# Patient Record
Sex: Female | Born: 1955
Health system: Southern US, Community
[De-identification: ages and names within clinical notes are randomized; demographics above are authoritative.]

## PROBLEM LIST (undated history)

## (undated) DIAGNOSIS — R600 Localized edema: Secondary | ICD-10-CM

## (undated) DIAGNOSIS — M1990 Unspecified osteoarthritis, unspecified site: Secondary | ICD-10-CM

## (undated) DIAGNOSIS — Z8709 Personal history of other diseases of the respiratory system: Secondary | ICD-10-CM

## (undated) DIAGNOSIS — Z8719 Personal history of other diseases of the digestive system: Secondary | ICD-10-CM

## (undated) DIAGNOSIS — K219 Gastro-esophageal reflux disease without esophagitis: Secondary | ICD-10-CM

## (undated) DIAGNOSIS — R519 Headache, unspecified: Secondary | ICD-10-CM

## (undated) DIAGNOSIS — H409 Unspecified glaucoma: Secondary | ICD-10-CM

## (undated) DIAGNOSIS — T8859XA Other complications of anesthesia, initial encounter: Secondary | ICD-10-CM

## (undated) DIAGNOSIS — J449 Chronic obstructive pulmonary disease, unspecified: Secondary | ICD-10-CM

## (undated) DIAGNOSIS — F32A Depression, unspecified: Secondary | ICD-10-CM

## (undated) DIAGNOSIS — I10 Essential (primary) hypertension: Secondary | ICD-10-CM

## (undated) DIAGNOSIS — E119 Type 2 diabetes mellitus without complications: Secondary | ICD-10-CM

## (undated) DIAGNOSIS — G5620 Lesion of ulnar nerve, unspecified upper limb: Secondary | ICD-10-CM

## (undated) DIAGNOSIS — D3500 Benign neoplasm of unspecified adrenal gland: Secondary | ICD-10-CM

## (undated) DIAGNOSIS — J45909 Unspecified asthma, uncomplicated: Secondary | ICD-10-CM

## (undated) DIAGNOSIS — T4145XA Adverse effect of unspecified anesthetic, initial encounter: Secondary | ICD-10-CM

## (undated) DIAGNOSIS — K2970 Gastritis, unspecified, without bleeding: Secondary | ICD-10-CM

## (undated) DIAGNOSIS — Z86718 Personal history of other venous thrombosis and embolism: Secondary | ICD-10-CM

## (undated) DIAGNOSIS — K859 Acute pancreatitis without necrosis or infection, unspecified: Secondary | ICD-10-CM

## (undated) DIAGNOSIS — Z8679 Personal history of other diseases of the circulatory system: Secondary | ICD-10-CM

## (undated) DIAGNOSIS — R51 Headache: Secondary | ICD-10-CM

## (undated) DIAGNOSIS — F1011 Alcohol abuse, in remission: Secondary | ICD-10-CM

## (undated) HISTORY — DX: Unspecified glaucoma: H40.9

## (undated) HISTORY — DX: Depression, unspecified: F32.A

## (undated) HISTORY — PX: DILATION AND CURETTAGE OF UTERUS: SHX78

---

## 1993-06-18 HISTORY — PX: TOTAL ABDOMINAL HYSTERECTOMY W/ BILATERAL SALPINGOOPHORECTOMY: SHX83

## 1997-12-31 ENCOUNTER — Emergency Department (HOSPITAL_COMMUNITY): Admission: EM | Admit: 1997-12-31 | Discharge: 1997-12-31 | Payer: Self-pay | Admitting: Emergency Medicine

## 1998-01-03 ENCOUNTER — Encounter: Admission: RE | Admit: 1998-01-03 | Discharge: 1998-01-03 | Payer: Self-pay | Admitting: Hematology and Oncology

## 1998-01-04 ENCOUNTER — Ambulatory Visit (HOSPITAL_COMMUNITY): Admission: RE | Admit: 1998-01-04 | Discharge: 1998-01-04 | Payer: Self-pay | Admitting: Infectious Diseases

## 1998-01-17 ENCOUNTER — Encounter: Admission: RE | Admit: 1998-01-17 | Discharge: 1998-01-17 | Payer: Self-pay | Admitting: Internal Medicine

## 1998-01-21 ENCOUNTER — Encounter: Admission: RE | Admit: 1998-01-21 | Discharge: 1998-01-21 | Payer: Self-pay | Admitting: Internal Medicine

## 1998-06-18 HISTORY — PX: KNEE ARTHROSCOPY: SUR90

## 1998-08-12 ENCOUNTER — Encounter: Payer: Self-pay | Admitting: Emergency Medicine

## 1998-08-12 ENCOUNTER — Emergency Department (HOSPITAL_COMMUNITY): Admission: EM | Admit: 1998-08-12 | Discharge: 1998-08-12 | Payer: Self-pay | Admitting: Emergency Medicine

## 1998-08-19 ENCOUNTER — Emergency Department (HOSPITAL_COMMUNITY): Admission: EM | Admit: 1998-08-19 | Discharge: 1998-08-19 | Payer: Self-pay | Admitting: Internal Medicine

## 1998-08-19 ENCOUNTER — Encounter: Payer: Self-pay | Admitting: Internal Medicine

## 1998-11-18 ENCOUNTER — Encounter: Payer: Self-pay | Admitting: Emergency Medicine

## 1998-11-18 ENCOUNTER — Emergency Department (HOSPITAL_COMMUNITY): Admission: EM | Admit: 1998-11-18 | Discharge: 1998-11-18 | Payer: Self-pay | Admitting: Emergency Medicine

## 1998-11-21 ENCOUNTER — Encounter: Admission: RE | Admit: 1998-11-21 | Discharge: 1998-11-21 | Payer: Self-pay | Admitting: Internal Medicine

## 1998-12-01 ENCOUNTER — Encounter: Admission: RE | Admit: 1998-12-01 | Discharge: 1998-12-01 | Payer: Self-pay | Admitting: Internal Medicine

## 1998-12-18 ENCOUNTER — Emergency Department (HOSPITAL_COMMUNITY): Admission: EM | Admit: 1998-12-18 | Discharge: 1998-12-18 | Payer: Self-pay

## 1999-02-20 ENCOUNTER — Encounter: Payer: Self-pay | Admitting: Emergency Medicine

## 1999-02-20 ENCOUNTER — Emergency Department (HOSPITAL_COMMUNITY): Admission: EM | Admit: 1999-02-20 | Discharge: 1999-02-20 | Payer: Self-pay | Admitting: Emergency Medicine

## 1999-03-20 ENCOUNTER — Encounter: Admission: RE | Admit: 1999-03-20 | Discharge: 1999-03-20 | Payer: Self-pay | Admitting: Internal Medicine

## 1999-04-24 ENCOUNTER — Encounter: Admission: RE | Admit: 1999-04-24 | Discharge: 1999-04-24 | Payer: Self-pay | Admitting: Hematology and Oncology

## 1999-10-18 ENCOUNTER — Emergency Department (HOSPITAL_COMMUNITY): Admission: EM | Admit: 1999-10-18 | Discharge: 1999-10-18 | Payer: Self-pay | Admitting: *Deleted

## 1999-11-28 ENCOUNTER — Encounter: Admission: RE | Admit: 1999-11-28 | Discharge: 1999-11-28 | Payer: Self-pay | Admitting: Obstetrics

## 2000-02-28 ENCOUNTER — Emergency Department (HOSPITAL_COMMUNITY): Admission: EM | Admit: 2000-02-28 | Discharge: 2000-02-28 | Payer: Self-pay | Admitting: *Deleted

## 2000-05-29 ENCOUNTER — Encounter: Payer: Self-pay | Admitting: Family Medicine

## 2000-05-29 ENCOUNTER — Ambulatory Visit (HOSPITAL_COMMUNITY): Admission: RE | Admit: 2000-05-29 | Discharge: 2000-05-29 | Payer: Self-pay | Admitting: Family Medicine

## 2000-07-25 ENCOUNTER — Other Ambulatory Visit: Admission: RE | Admit: 2000-07-25 | Discharge: 2000-07-25 | Payer: Self-pay | Admitting: Family Medicine

## 2001-01-08 ENCOUNTER — Encounter (HOSPITAL_COMMUNITY): Admission: RE | Admit: 2001-01-08 | Discharge: 2001-04-08 | Payer: Self-pay | Admitting: Family Medicine

## 2001-01-31 ENCOUNTER — Emergency Department (HOSPITAL_COMMUNITY): Admission: EM | Admit: 2001-01-31 | Discharge: 2001-02-01 | Payer: Self-pay

## 2001-04-11 ENCOUNTER — Encounter: Admission: RE | Admit: 2001-04-11 | Discharge: 2001-04-11 | Payer: Self-pay | Admitting: Family Medicine

## 2001-04-11 ENCOUNTER — Encounter: Payer: Self-pay | Admitting: Family Medicine

## 2001-07-28 ENCOUNTER — Emergency Department (HOSPITAL_COMMUNITY): Admission: EM | Admit: 2001-07-28 | Discharge: 2001-07-29 | Payer: Self-pay | Admitting: Emergency Medicine

## 2002-11-18 ENCOUNTER — Emergency Department (HOSPITAL_COMMUNITY): Admission: EM | Admit: 2002-11-18 | Discharge: 2002-11-18 | Payer: Self-pay | Admitting: Emergency Medicine

## 2002-12-16 ENCOUNTER — Ambulatory Visit (HOSPITAL_COMMUNITY): Admission: RE | Admit: 2002-12-16 | Discharge: 2002-12-16 | Payer: Self-pay | Admitting: Internal Medicine

## 2002-12-16 ENCOUNTER — Encounter: Payer: Self-pay | Admitting: Internal Medicine

## 2003-04-08 ENCOUNTER — Emergency Department (HOSPITAL_COMMUNITY): Admission: EM | Admit: 2003-04-08 | Discharge: 2003-04-08 | Payer: Self-pay | Admitting: Emergency Medicine

## 2003-04-08 ENCOUNTER — Encounter: Payer: Self-pay | Admitting: Emergency Medicine

## 2003-04-10 ENCOUNTER — Emergency Department (HOSPITAL_COMMUNITY): Admission: EM | Admit: 2003-04-10 | Discharge: 2003-04-10 | Payer: Self-pay | Admitting: Emergency Medicine

## 2003-04-28 ENCOUNTER — Ambulatory Visit (HOSPITAL_COMMUNITY): Admission: RE | Admit: 2003-04-28 | Discharge: 2003-04-28 | Payer: Self-pay | Admitting: Internal Medicine

## 2003-08-25 ENCOUNTER — Other Ambulatory Visit: Admission: RE | Admit: 2003-08-25 | Discharge: 2003-08-25 | Payer: Self-pay | Admitting: Family Medicine

## 2003-08-31 ENCOUNTER — Encounter: Admission: RE | Admit: 2003-08-31 | Discharge: 2003-08-31 | Payer: Self-pay | Admitting: Internal Medicine

## 2003-12-17 ENCOUNTER — Emergency Department (HOSPITAL_COMMUNITY): Admission: EM | Admit: 2003-12-17 | Discharge: 2003-12-18 | Payer: Self-pay | Admitting: *Deleted

## 2004-02-15 ENCOUNTER — Ambulatory Visit (HOSPITAL_COMMUNITY): Admission: RE | Admit: 2004-02-15 | Discharge: 2004-02-15 | Payer: Self-pay | Admitting: Internal Medicine

## 2004-04-20 ENCOUNTER — Ambulatory Visit: Payer: Self-pay | Admitting: Nurse Practitioner

## 2004-06-05 ENCOUNTER — Ambulatory Visit: Payer: Self-pay | Admitting: Nurse Practitioner

## 2004-08-07 ENCOUNTER — Ambulatory Visit: Payer: Self-pay | Admitting: Internal Medicine

## 2004-08-11 ENCOUNTER — Ambulatory Visit: Payer: Self-pay | Admitting: Nurse Practitioner

## 2004-08-28 ENCOUNTER — Ambulatory Visit: Payer: Self-pay | Admitting: Nurse Practitioner

## 2004-08-28 ENCOUNTER — Ambulatory Visit: Payer: Self-pay | Admitting: *Deleted

## 2004-09-15 ENCOUNTER — Ambulatory Visit: Payer: Self-pay | Admitting: Family Medicine

## 2004-11-14 ENCOUNTER — Ambulatory Visit: Payer: Self-pay | Admitting: Nurse Practitioner

## 2004-11-15 ENCOUNTER — Ambulatory Visit (HOSPITAL_COMMUNITY): Admission: RE | Admit: 2004-11-15 | Discharge: 2004-11-15 | Payer: Self-pay | Admitting: Internal Medicine

## 2004-11-16 ENCOUNTER — Ambulatory Visit: Payer: Self-pay | Admitting: Nurse Practitioner

## 2004-11-24 ENCOUNTER — Encounter: Admission: RE | Admit: 2004-11-24 | Discharge: 2004-11-24 | Payer: Self-pay | Admitting: Family Medicine

## 2004-12-01 ENCOUNTER — Encounter: Admission: RE | Admit: 2004-12-01 | Discharge: 2004-12-01 | Payer: Self-pay | Admitting: Family Medicine

## 2004-12-18 ENCOUNTER — Ambulatory Visit: Payer: Self-pay | Admitting: Nurse Practitioner

## 2005-01-02 ENCOUNTER — Ambulatory Visit: Payer: Self-pay | Admitting: Nurse Practitioner

## 2005-01-12 ENCOUNTER — Encounter: Admission: RE | Admit: 2005-01-12 | Discharge: 2005-01-12 | Payer: Self-pay | Admitting: Internal Medicine

## 2005-02-06 ENCOUNTER — Emergency Department (HOSPITAL_COMMUNITY): Admission: EM | Admit: 2005-02-06 | Discharge: 2005-02-06 | Payer: Self-pay | Admitting: Emergency Medicine

## 2005-06-13 ENCOUNTER — Ambulatory Visit: Payer: Self-pay | Admitting: Nurse Practitioner

## 2006-01-09 ENCOUNTER — Ambulatory Visit: Payer: Self-pay | Admitting: Nurse Practitioner

## 2006-05-14 ENCOUNTER — Emergency Department (HOSPITAL_COMMUNITY): Admission: EM | Admit: 2006-05-14 | Discharge: 2006-05-14 | Payer: Self-pay | Admitting: Emergency Medicine

## 2007-02-05 DIAGNOSIS — M545 Low back pain, unspecified: Secondary | ICD-10-CM | POA: Insufficient documentation

## 2007-02-05 DIAGNOSIS — M159 Polyosteoarthritis, unspecified: Secondary | ICD-10-CM | POA: Insufficient documentation

## 2007-02-05 DIAGNOSIS — K5900 Constipation, unspecified: Secondary | ICD-10-CM | POA: Insufficient documentation

## 2007-02-05 DIAGNOSIS — F17200 Nicotine dependence, unspecified, uncomplicated: Secondary | ICD-10-CM | POA: Insufficient documentation

## 2007-02-05 DIAGNOSIS — Z87440 Personal history of urinary (tract) infections: Secondary | ICD-10-CM | POA: Insufficient documentation

## 2007-08-20 ENCOUNTER — Emergency Department (HOSPITAL_COMMUNITY): Admission: EM | Admit: 2007-08-20 | Discharge: 2007-08-20 | Payer: Self-pay | Admitting: Emergency Medicine

## 2007-09-18 ENCOUNTER — Encounter: Admission: RE | Admit: 2007-09-18 | Discharge: 2007-09-18 | Payer: Self-pay | Admitting: Internal Medicine

## 2007-09-30 ENCOUNTER — Encounter: Admission: RE | Admit: 2007-09-30 | Discharge: 2007-09-30 | Payer: Self-pay | Admitting: Internal Medicine

## 2007-12-04 ENCOUNTER — Emergency Department (HOSPITAL_COMMUNITY): Admission: EM | Admit: 2007-12-04 | Discharge: 2007-12-05 | Payer: Self-pay | Admitting: Emergency Medicine

## 2007-12-22 ENCOUNTER — Encounter: Admission: RE | Admit: 2007-12-22 | Discharge: 2007-12-22 | Payer: Self-pay | Admitting: Internal Medicine

## 2008-09-11 ENCOUNTER — Emergency Department (HOSPITAL_COMMUNITY): Admission: EM | Admit: 2008-09-11 | Discharge: 2008-09-11 | Payer: Self-pay | Admitting: Emergency Medicine

## 2008-10-21 ENCOUNTER — Encounter: Admission: RE | Admit: 2008-10-21 | Discharge: 2008-10-21 | Payer: Self-pay | Admitting: Internal Medicine

## 2009-10-02 ENCOUNTER — Emergency Department (HOSPITAL_COMMUNITY): Admission: EM | Admit: 2009-10-02 | Discharge: 2009-10-02 | Payer: Self-pay | Admitting: Family Medicine

## 2010-01-12 ENCOUNTER — Encounter: Admission: RE | Admit: 2010-01-12 | Discharge: 2010-01-12 | Payer: Self-pay | Admitting: Internal Medicine

## 2010-04-05 ENCOUNTER — Emergency Department (HOSPITAL_COMMUNITY): Admission: EM | Admit: 2010-04-05 | Discharge: 2010-04-05 | Payer: Self-pay | Admitting: Emergency Medicine

## 2010-05-16 ENCOUNTER — Emergency Department (HOSPITAL_COMMUNITY): Admission: EM | Admit: 2010-05-16 | Discharge: 2010-05-16 | Payer: Self-pay | Admitting: Emergency Medicine

## 2010-07-09 ENCOUNTER — Encounter: Payer: Self-pay | Admitting: Internal Medicine

## 2010-08-29 ENCOUNTER — Observation Stay (HOSPITAL_COMMUNITY)
Admission: EM | Admit: 2010-08-29 | Discharge: 2010-08-30 | Discharge status: Against Medical Advice | Payer: PRIVATE HEALTH INSURANCE | Attending: Internal Medicine | Admitting: Internal Medicine

## 2010-08-29 ENCOUNTER — Emergency Department (HOSPITAL_COMMUNITY): Payer: PRIVATE HEALTH INSURANCE

## 2010-08-29 DIAGNOSIS — F17200 Nicotine dependence, unspecified, uncomplicated: Secondary | ICD-10-CM | POA: Insufficient documentation

## 2010-08-29 DIAGNOSIS — I10 Essential (primary) hypertension: Secondary | ICD-10-CM | POA: Insufficient documentation

## 2010-08-29 DIAGNOSIS — K219 Gastro-esophageal reflux disease without esophagitis: Secondary | ICD-10-CM | POA: Insufficient documentation

## 2010-08-29 DIAGNOSIS — J4489 Other specified chronic obstructive pulmonary disease: Secondary | ICD-10-CM | POA: Insufficient documentation

## 2010-08-29 DIAGNOSIS — J811 Chronic pulmonary edema: Secondary | ICD-10-CM | POA: Insufficient documentation

## 2010-08-29 DIAGNOSIS — M129 Arthropathy, unspecified: Secondary | ICD-10-CM | POA: Insufficient documentation

## 2010-08-29 DIAGNOSIS — J449 Chronic obstructive pulmonary disease, unspecified: Secondary | ICD-10-CM | POA: Insufficient documentation

## 2010-08-29 DIAGNOSIS — R079 Chest pain, unspecified: Principal | ICD-10-CM | POA: Insufficient documentation

## 2010-08-29 LAB — CBC
HCT: 38 % (ref 36.0–46.0)
Hemoglobin: 13.5 g/dL (ref 12.0–15.0)
MCH: 26.7 pg (ref 26.0–34.0)
MCH: 26.7 pg (ref 26.0–34.0)
MCV: 78.2 fL (ref 78.0–100.0)
Platelets: 238 10*3/uL (ref 150–400)
Platelets: 238 10*3/uL (ref 150–400)
RBC: 5.06 MIL/uL (ref 3.87–5.11)
RDW: 15.3 % (ref 11.5–15.5)
WBC: 5.3 10*3/uL (ref 4.0–10.5)
WBC: 5.1 10*3/uL (ref 4.0–10.5)

## 2010-08-29 LAB — POCT I-STAT, CHEM 8
Calcium, Ion: 1.11 mmol/L — ABNORMAL LOW (ref 1.12–1.32)
HCT: 42 % (ref 36.0–46.0)
Hemoglobin: 14.3 g/dL (ref 12.0–15.0)
Sodium: 141 mEq/L (ref 135–145)
TCO2: 23 mmol/L (ref 0–100)

## 2010-08-29 LAB — DIFFERENTIAL
Basophils Absolute: 0 10*3/uL (ref 0.0–0.1)
Basophils Relative: 0 % (ref 0–1)
Eosinophils Absolute: 0.1 10*3/uL (ref 0.0–0.7)
Eosinophils Relative: 2 % (ref 0–5)
Lymphocytes Relative: 47 % — ABNORMAL HIGH (ref 12–46)
Lymphs Abs: 2.5 10*3/uL (ref 0.7–4.0)
Monocytes Absolute: 0.4 10*3/uL (ref 0.1–1.0)
Monocytes Relative: 7 % (ref 3–12)
Neutro Abs: 2.3 10*3/uL (ref 1.7–7.7)
Neutrophils Relative %: 44 % (ref 43–77)

## 2010-08-29 LAB — CARDIAC PANEL(CRET KIN+CKTOT+MB+TROPI)
CK, MB: 2.4 ng/mL (ref 0.3–4.0)
CK, MB: 2.7 ng/mL (ref 0.3–4.0)
Relative Index: 0.8 (ref 0.0–2.5)
Total CK: 337 U/L — ABNORMAL HIGH (ref 7–177)

## 2010-08-29 LAB — POCT CARDIAC MARKERS
CKMB, poc: 1.1 ng/mL (ref 1.0–8.0)
Myoglobin, poc: 98.7 ng/mL (ref 12–200)
Troponin i, poc: <0.05 ng/mL (ref 0.00–0.09)

## 2010-08-29 LAB — CK TOTAL AND CKMB (NOT AT ARMC)
CK, MB: 2.4 ng/mL (ref 0.3–4.0)
Total CK: 322 U/L — ABNORMAL HIGH (ref 7–177)

## 2010-08-30 ENCOUNTER — Inpatient Hospital Stay (HOSPITAL_COMMUNITY): Payer: PRIVATE HEALTH INSURANCE

## 2010-08-30 LAB — COMPREHENSIVE METABOLIC PANEL
ALT: 15 U/L (ref 0–35)
Alkaline Phosphatase: 135 U/L — ABNORMAL HIGH (ref 39–117)
BUN: 7 mg/dL (ref 6–23)
CO2: 25 mEq/L (ref 19–32)
Calcium: 9 mg/dL (ref 8.4–10.5)
GFR calc non Af Amer: >60 mL/min (ref >60)
Glucose, Bld: 222 mg/dL — ABNORMAL HIGH (ref 70–99)
Potassium: 3.6 mEq/L (ref 3.5–5.1)
Sodium: 137 mEq/L (ref 135–145)

## 2010-08-30 LAB — HEMOGLOBIN A1C
Hgb A1c MFr Bld: 7.3 % — ABNORMAL HIGH (ref <5.7)
Mean Plasma Glucose: 163 mg/dL — ABNORMAL HIGH (ref <117)

## 2010-08-30 LAB — LIPID PANEL
Cholesterol: 166 mg/dL (ref 0–200)
LDL Cholesterol: 84 mg/dL (ref 0–99)

## 2010-08-30 LAB — CARDIAC PANEL(CRET KIN+CKTOT+MB+TROPI): Total CK: 348 U/L — ABNORMAL HIGH (ref 7–177)

## 2010-08-31 ENCOUNTER — Other Ambulatory Visit (HOSPITAL_COMMUNITY): Payer: PRIVATE HEALTH INSURANCE

## 2010-08-31 NOTE — Consult Note (Signed)
Sarah [name], Sarah [name] NO.:  [id number]  MEDICAL RECORD NO.:  [id number]           PATIENT TYPE:  I  LOCATION:  2035                         FACILITY:  [facility]  PHYSICIAN:  Armanda Magic, M.D.     DATE OF BIRTH:  [birth year]  DATE OF CONSULTATION:  08/29/2010 DATE OF DISCHARGE:                                CONSULTATION   REFERRING PHYSICIAN:  Azalia Bilis, MD  CHIEF COMPLAINT:  Chest pain.  HISTORY OF PRESENT ILLNESS:  This is a 55 year old African American female who was in her usual state of health until this morning.  She has a history of GERD and had some heartburn this morning and took [employer] and shortly after that had severe pain in her left breast and into her left arm with left hand swelling.  She describes the chest pain as a squeezing sensation under her left breast with radiation up into the left arm.  The pain was increased with deep breathing and was associated with nausea.  She has had shortness of breath, but no diaphoresis.  The nausea lasted about 6-7 seconds and resolved, but the pain lasted approximately 10 minutes until EMS got there and gave her nitroglycerin which helped resolve her pain.  She currently is pain- free.  PAST MEDICAL HISTORY:  GERD and lower extremity edema.  SOCIAL HISTORY:  She smokes one and half packs of cigarettes daily.  She denies any alcohol use.  She is married.  PAST SURGICAL HISTORY:  Total abdominal hysterectomy.  MEDICATIONS:  Currently are none.  FAMILY HISTORY:  Her mother died of pneumonia.  Her father died of questionable cause.  REVIEW OF SYSTEMS:  Other than what stated in the HPI is negative.  PHYSICAL EXAMINATION:  VITAL SIGNS:  Blood pressure is 121/71, heart rate 86, respirations 14, and O2 saturations 98% on 2 liters. GENERAL:  A well-developed, obese black female in no acute distress. HEENT:  Benign. NECK:  Supple without lymphadenopathy.  Carotid upstrokes +2 bilaterally.  No  bruits. LUNGS:  Clear to auscultation throughout. HEART:  Regular rate and rhythm.  No murmurs, rubs, or gallops.  Normal S1 and S2. ABDOMEN:  Soft, nontender, and nondistended.  Normoactive bowel sounds. No hepatosplenomegaly. EXTREMITIES:  No cyanosis, erythema, or edema.  LABORATORY DATA:  Sodium 141, potassium 3.6, chloride 105, bicarb 23, BUN 6, creatinine 0.7, and glucose 195.  White cell count 5.3, hemoglobin 13.5, hematocrit 39.7, and platelet count 238.  Myoglobin 98.7, CPK-MB 1.1, and troponin less than 0.05.  EKG shows sinus rhythm with no ST changes.  Chest x-ray shows subsegmental atelectasis or fibrosis.  ASSESSMENT: 1. Chest pain syndrome with nonischemic EKG.  Cardiac point-of-care     markers negative x1.  Currently pain free.  Cardiac risk factors     include obesity, postmenopausal, and tobacco use. 2. Obesity. 3. Gastroesophageal reflux disease. 4. Ongoing tobacco abuse.  PLAN:  Hospitalist will admit the patient.  We will cycle cardiac enzymes, make n.p.o. after midnight and if cardiac enzymes are negative, we will plan Lexiscan Cardiolite in the morning.  For now, we will cover with subcu  Lovenox per pharmacy protocol and an aspirin 325 mg daily.     Armanda Magic, M.D.     TT/MEDQ  D:  08/29/2010  T:  08/30/2010  Job:  [id number]  Electronically Signed by Armanda Magic M.D. on 08/31/2010 03:37:54 PM

## 2010-09-05 LAB — POCT I-STAT, CHEM 8
Chloride: 102 mEq/L (ref 96–112)
HCT: 47 % — ABNORMAL HIGH (ref 36.0–46.0)
Potassium: 3.7 mEq/L (ref 3.5–5.1)

## 2010-09-08 NOTE — Discharge Summary (Signed)
[name], [name] NO.:  [id number]  MEDICAL RECORD NO.:  [id number]           PATIENT TYPE:  I  LOCATION:  2035                         FACILITY:  [facility]  PHYSICIAN:  Ramiro Harvest, MD    DATE OF BIRTH:  1956-01-15  DATE OF ADMISSION:  08/29/2010 DATE OF DISCHARGE:  08/30/2010                        DISCHARGE SUMMARY - REFERRING   PRIMARY CARE PHYSICIAN:  With [facility].  The patient left against medical advice.  DISCHARGE DIAGNOSES: 1. Chest pain. 2. Gastroesophageal reflux disease. 3. Hypertension. 4. Chronic obstructive pulmonary disease. 5. Chronic peripheral edema. 6. Tobacco abuse. 7. Arthritis. 8. Status post total abdominal hysterectomy.  DISCHARGE MEDICATIONS:  None as the patient left against medical advice.  DISPOSITION AND FOLLOWUP:  None as the patient left against medical advice.  Hopefully, the patient will follow up with her PCP.  The patient will likely need better control of her hypertension.  CONSULTATIONS DONE:  A cardiology consultation was done.  The patient was seen in consultation by Dr. Armanda Magic of [facility] on August 29, 2010.  PROCEDURES PERFORMED:  A chest x-ray was done on August 29, 2010 that showed area of subsegmental atelectasis or fibrosis in the left lung base.  No pulmonary edema, pneumonia, or pleural effusion is evident. No pneumothorax.  BRIEF ADMISSION HISTORY AND PHYSICAL:  [name] is a 55 year old African American female with history of tobacco abuse.  The patient also stated that she had a history of hypertension, but has been taken off her medication by PCP.  The patient came to the hospital complaining of chest pain.  The patient was sitting.  She had very severe retrosternal chest pain 10/10 that went to the left shoulder and arm.  The patient's pain was described as a tightness to colicky pain.  The patient's pain lasted for 15 minutes until emergency personal came in, they  provided nitroglycerin and aspirin.  The patient was evaluated in the ED.  First set of cardiac enzymes were negative.  Cardiology was consulted for evaluation.  Hospice were called to admit the patient.  For the rest of admission history and physical, please see H and P dictated by Dr. Arthor Captain of job number [phone].  HOSPITAL COURSE: 1. Chest pain.  The patient was admitted with chest pain.  She was     placed on tele monitor, placed on aspirin as well as Lovenox.  The     patient cardiology consultation was done.  The patient was seen in     consultation by Dr. Armanda Magic of [facility].  EKG that was     done showed EKG with normal sinus rhythm.  The patient's cardiac     risk factors included obesity, postmenopausal, tobacco abuse and     hypertension.  The patient's cardiac enzymes were cycled q.8 h. x3,     which were negative x3.  TSH which was obtained was within normal     limits at 2.599.  Lipid panel which was obtained did have a total     cholesterol of 166, triglycerides of 307, HDL of 21 and LDL of 84.  The patient was scheduled for a Lexiscan Cardiolite to be done on     the day of admission; however, the patient refused the test as the     test had been delayed and the patient stated that she had nothing     to eat.  The patient wanted to eat her donuts, but however was told     that she could have nothing by mouth.  Prior to the test, the     patient was given some water per nuclear medicine and departmental     who has stated that it was okay for her to get some water; however,     the patient was frustrated.  The patient was subsequently taken     down to nuclear medicine for her stress test and because the     patient had to wait, she refused the test and came back to the     floor.  The patient subsequently removed a heart monitor, requested     IV be removed and the patient subsequently left the hospital     against medical advice.  Hopefully, the patient  will follow up with     her PCP as an outpatient. 2. Hypertension.  During the hospitalization, the patient was noted to     be hypertensive with systolic blood pressures ranging in the 150s     to the 170s.  The patient was started on HCTZ; however, the patient     left against medical advice in a such her blood pressure     medications could not be titrated.  Hopefully, the patient will     follow up with PCP as an outpatient.  The patient will likely     benefit from better blood pressure control.  The rest of the     patient's chronic issues remained stable.  The patient again left     the hospital against medical advice on August 30, 2010.  VITAL SIGNS:  On day of discharge vital signs temperature 98.4, pulse of 87, respirations 20, blood pressure 156/74, satting 95% on room air.  DISCHARGE LABS:  Hemoglobin A1c of 7.3, TSH of 2.599.  Lipid profile total cholesterol of 166, triglycerides 307, HDL 21, LDL of 84.  CMET sodium 137, potassium 3.6, chloride 104, bicarb 25, glucose 222, BUN 7, creatinine 0.65, bilirubin 0.4, alk phosphatase 135, AST 23, ALT 15, protein 6.7, albumin 3.5 and a calcium of 9.0.  Cardiac enzymes were negative x3.  Once again, the patient left against medical advice.     Ramiro Harvest, MD     DT/MEDQ  D:  08/30/2010  T:  08/30/2010  Job:  [id number]  cc:   Armanda Magic, M.D.  Electronically Signed by Ramiro Harvest MD on 09/08/2010 12:10:26 PM

## 2010-09-28 LAB — BASIC METABOLIC PANEL
Chloride: 99 mEq/L (ref 96–112)
GFR calc Af Amer: >60 mL/min (ref >60)
Potassium: 3.3 mEq/L — ABNORMAL LOW (ref 3.5–5.1)
Sodium: 134 mEq/L — ABNORMAL LOW (ref 135–145)

## 2010-09-28 LAB — DIFFERENTIAL
Eosinophils Absolute: 0.1 10*3/uL (ref 0.0–0.7)
Eosinophils Relative: 1 % (ref 0–5)
Lymphs Abs: 3 10*3/uL (ref 0.7–4.0)
Monocytes Relative: 8 % (ref 3–12)
Neutrophils Relative %: 53 % (ref 43–77)

## 2010-09-28 LAB — CBC
HCT: 41.5 % (ref 36.0–46.0)
Hemoglobin: 14 g/dL (ref 12.0–15.0)
MCV: 80.7 fL (ref 78.0–100.0)
RBC: 5.14 MIL/uL — ABNORMAL HIGH (ref 3.87–5.11)
WBC: 8.1 10*3/uL (ref 4.0–10.5)

## 2010-09-28 LAB — POCT CARDIAC MARKERS
CKMB, poc: 1.3 ng/mL (ref 1.0–8.0)
Troponin i, poc: <0.05 ng/mL (ref 0.00–0.09)

## 2010-09-28 LAB — BRAIN NATRIURETIC PEPTIDE: Pro B Natriuretic peptide (BNP): <30 pg/mL (ref 0.0–100.0)

## 2010-10-04 NOTE — H&P (Signed)
[name], [name] NO.:  [id number]  MEDICAL RECORD NO.:  [id number]           PATIENT TYPE:  I  LOCATION:  2035                         FACILITY:  [facility]  PHYSICIAN:  Clydia Llano, MD       DATE OF BIRTH:  Apr 17, 1956  DATE OF ADMISSION:  08/29/2010 DATE OF DISCHARGE:                             HISTORY & PHYSICAL   PRIMARY CARE PHYSICIAN:  [facility]  REASON FOR ADMISSION:  Chest pain.  BRIEF HISTORY:  Sarah [name] is a 55 year old African American female with a history of tobacco abuse.  The patient also said she had hypertension, but she was taken off the medication by her physician. The patient came into the hospital complaining about chest pain.  The patient was sitting when she had very severe retrosternal chest pain 10/10, goes to her left shoulder and arm.  The patient's pain described as tightness to colicky pain.  The patient's pain lasted for 15 minutes till the emergency personal came in and they provide nitroglycerin and aspirin.  The patient evaluated in the emergency department with first set of cardiac enzymes was negative.  Cardiology was called for evaluation, then hospitalist called to admit.  PAST MEDICAL HISTORY: 1. Hypertension. 2. Arthritis. 3. Chronic peripheral edema. 4. COPD.  FAMILY HISTORY:  No premature history of coronary artery disease.  SOCIAL HISTORY: 1. The patient was a heavy drinker who quit drinking about more than     10 years ago.  The patient used to drink more than a fifth of gin     daily.  The patient is still smoking.  She smokes 1-1/2 pack per     day.  She has been doing this since she was a teenager. 2. No other street drugs and  ALLERGIES: 1. IBUPROFEN. 2. PENICILLIN.  MEDICATIONS:  The patient takes Aleve p.r.n. for aches and pains.  REVIEW OF SYSTEMS:  A 12-point review of systems is negative except for the symptoms mentioned in the HPI.  PHYSICAL EXAMINATION:  VITAL SIGNS:  Temperature is  98.2, pulse is 85, respirations 20, systolic blood pressure is 171/96, sats is 96% on room air. GENERAL:  The patient is alert, awake, oriented x3.  The patient is obese African American female wearing hospital gown, lies on her back in no acute distress, well developed, well nourished. HEAD AND FACE:  Normocephalic, atraumatic. EYES:  Pupil are equal, reactive to light, and accommodation. ENT:  Ears, nose, and throat normal.  Mouth without oral thrush or lesions. NECK:  Supple.  No carotid bruit.  No meningeal signs. CARDIOVASCULAR:  Regular rate and rhythm.  No murmurs, rubs or gallops. RESPIRATORY:  Breath sounds equal bilaterally.  No rales, rhonchi, or wheezes. CHEST:  Nontender to pressure. ABDOMEN:  Bowel sounds are heard.  Soft, nontender, or distended.  No masses.  No hepatosplenomegaly appreciated. EXTREMITIES:  Trace pedal edema noted with socks impression on the skin. NEUROLOGIC:  Alert, awake, oriented x3, normal speech.  Gait was not tested.  No focal neurological or sensory deficit. SKIN:  Normal color and no rash.  LABORATORY DATA: 1. CBC, WBC is 5.3,  hemoglobin 15.5, hematocrit 39.7, platelets counts     238. 2. BMP, sodium 141, potassium 3.6, chloride 105, BUN 6, creatinine     0.7, calcium is 1.1, and glucose is 195. 3. Cardiac enzymes, creatinine kinase 309, CK-MB 2.4, troponin less     than 0.01. 4. EKG, sinus rhythm without evidence of ischemic changes. 5. Radiology, chest x-ray showed area of subsegmental atelectasis or     fibrosis in the left lung base.  No other acute findings.  ASSESSMENT AND PLAN: 1. Chest pain.  The patient will be admitted to cardiac telemetry bed.     Cardiology consulted, probably stress test will be done in the     morning.  The patient received nitroglycerin and aspirin, morphine     for the pain.  The patient was not started on any other medication.     We will await for cardiology recommendation. 2. Hypertension.  The patient  says she had in the ED, blood pressure     121/71 and in the hospital it was 171/96.  The patient said she had     high blood pressure and it was doing fine, so she was taken off her     medication.  Cardiology, please advice about blood pressure. 3. Tobacco abuse.  Appropriate counseling was done. 4. Chronic obstructive pulmonary disease.  The patient has probable     COPD from her history of smoking.  No evidence of acute     exacerbation.  The patient complaining about daytime sleepiness and     snoring.  The patient probably has component of obstructive sleep     apnea.  The patient will need polysomnogram as outpatient.     Clydia Llano, MD   ______________________________ Clydia Llano, MD    ME/MEDQ  D:  08/29/2010  T:  08/29/2010  Job:  [id number]  cc:   Melvern Banker  Electronically Signed by Clydia Llano  on 10/04/2010 03:29:56 PM

## 2010-10-31 NOTE — Consult Note (Signed)
NAMESEQUITA, WISE NO.:  [id number]   MEDICAL RECORD NO.:  [id number]          PATIENT TYPE:  EMS   LOCATION:  MAJO                         FACILITY:  [facility]   PHYSICIAN:  Corinna L. Lendell Caprice, MDDATE OF BIRTH:  Oct 14, 1955   DATE OF CONSULTATION:  09/11/2008  DATE OF DISCHARGE:                                 CONSULTATION   REFERRING PHYSICIAN:  Jettie Pagan, MD.   REASON FOR CONSULTATION:  Chest pain.   IMPRESSIONS/RECOMMENDATIONS:  1. Chest pain:  This is not typical for acute coronary syndrome.  She      has musculoskeletal chest pain.  It is completely reproducible,      worse with inspiration, movement, coughing.  She was recently      diagnosed with bronchitis.  I will give her a dose of Toradol, and      she can be discharged to home.  She needs no further cardiac workup      at this time.  The E-chart record of ibuprofen allergy is      incorrect.  The patient reports that she has gastrointestinal upset      with ibuprofen but has taken NSAIDs without problems, otherwise.  I      have also given her a prescription for Vicodin.  2. Acute bronchitis, not improving with doxycycline and prednisone:      Certainly this is worsened by her continued smoking which I have      encouraged her against.  However, I will place the patient on      Avelox in addition to the doxycycline.  She reports that her cough      has worsened and her sputum initially was clear, now it is green.  3. Tobacco abuse, counseled against.   HISTORY OF PRESENT ILLNESS:  Ms. Barbar is a 55 year old black female  patient of Dr. Nehemiah Settle who presented to the emergency room with left-  sided chest pain.  She reports that it is worse with movement.  She has  been coughing a lot recently.  She has no shortness of breath.  She is  currently requesting to eat and go outside.   PAST MEDICAL HISTORY:  As above.   MEDICATIONS:  See triage notes.   SOCIAL HISTORY:  She is here with her  family.  She smokes but will not  say how much.  She denies excessive alcohol or drug abuse.   FAMILY HISTORY:  Noncontributory.   REVIEW OF SYSTEMS:  As above, otherwise negative.   PHYSICAL EXAMINATION:  VITAL SIGNS:  Please see nursing notes.  GENERAL:  The patient is an obese black female, in no acute distress.  HEENT:  Normocephalic, atraumatic.  Pupils are equal, round, and  reactive to light.  Sclerae nonicteric.  Moist mucous membranes.  NECK:  Supple.  No lymphadenopathy.  No JVD.  LUNGS:  Clear to auscultation bilaterally without wheezes, rhonchi, or  rales.  CARDIOVASCULAR:  Regular rate and rhythm without murmurs, gallops, or  rubs.  She has reproducible chest wall pain on the left, just below her  clavicle.  ABDOMEN:  Obese, soft, nontender.  GU:  Deferred.  RECTAL:  Deferred.  EXTREMITIES:  No clubbing, cyanosis, or edema.  NEUROLOGIC:  Alert and oriented.  Cranial nerves and sensorimotor exam  are intact.  PSYCHIATRIC:  Normal affect.   LABORATORY DATA:  CBC normal.  Basic metabolic panel significant for a  sodium of 134, potassium at 3.3, otherwise unremarkable.  BNP less than  30, d-dimer 0.41, point-of-care enzymes negative.  Chest x-ray shows  mild bibasilar atelectasis, slight chronic prominence of the main  pulmonary arteries.  EKG shows normal sinus rhythm.   The patient has received potassium for her hypokalemia here in the  emergency room.  She is agreeable with plan, and I have instructed her  to return to the emergency room or follow up with Dr. Nehemiah Settle should her  symptoms worsen.      Corinna L. Lendell Caprice, MD  Electronically Signed     CLS/MEDQ  D:  09/11/2008  T:  09/12/2008  Job:  [id number]

## 2010-11-03 NOTE — Consult Note (Signed)
NAME:  Sarah [name], Sarah [name]                          ACCOUNT NO.:  [id number]   MEDICAL RECORD NO.:  [id number]                   PATIENT TYPE:  EMS   LOCATION:  MAJO                                 FACILITY:  [facility]   PHYSICIAN:  Angelia Mould. Derrell Lolling, M.D.             DATE OF BIRTH:  May 19, 1956   DATE OF CONSULTATION:  11/18/2002  DATE OF DISCHARGE:                                   CONSULTATION   CHIEF COMPLAINT:  Painful hemorrhoids.   HISTORY OF PRESENT ILLNESS:  This is a 55 year old white female who  presented to the [facility] Emergency Room tonight with painful, swollen  hemorrhoids and some bleeding.  Dr. Markham Jordan L. Effie Shy asked me to see the  patient.   Apparently the patient saw Dr. Orson Slick yesterday in the office and underwent  some type of hemorrhoidal procedure with suturing.  She states that she has  had intense pain and swelling since that time and a little bit of bleeding.  Denies fever or chills.  Denies nausea or vomiting.   PAST HISTORY:  1. She has had multiple office procedures for hemorrhoids in the past.  2. She has had two knee operations.  3. She has had a hysterectomy and bilateral salpingo-oophorectomy.  4. She denies coronary artery disease, asthma, emphysema, diabetes,     hypertension, or stroke, or any history of cancer.   CURRENT MEDICATIONS:  None.   DRUG ALLERGIES:  PENICILLIN causes a rash, and ADVIL upsets her stomach.   SOCIAL HISTORY:  The patient receives medical care at [facility].  She is married, and her husband is here with her.   PHYSICAL EXAMINATION:  GENERAL:  Overweight black female who is alert and  reasonably cooperative but quite anxious and nervous.  VITAL SIGNS:  Temperature 97.6, heart rate 102 and regular, blood pressure  187/97.  ABDOMEN:  Soft and nontender.  Somewhat obese.  RECTAL:  She has a large edematous and thrombosed hemorrhoid posteriorly,  and there are some sutures that look like Vicryl sutures in this.  It is  not  actively bleeding.  Rectal sphincter is intact, and rectal exam reveals no  sphincter tone.  There is no cellulitis.  There is no abscess.  No sign of  infection or drainage.   PROCEDURE PERFORMED:  Betadine prep, 2% Xylocaine with epinephrine local  anesthesia.  A large posterior hemorrhoid was excised sharply with a knife  although it was a large hemorrhoid and had a smaller pedicle.  There was a  little bit of bleeding which was controlled nicely with Surgicel gauze and  pressure, and after 10 minutes there really was not bleeding.  A bulky  absorbant dressing was placed.  The patient tolerated this well and had  excellent pain relief.   ASSESSMENT:  Large thrombosed hemorrhoid posteriorly.    PLAN:  1. The hemorrhoid was excised with good results technically, and the wound  was left open.  2. She will be discharged home.  3. She was told to adhere to strict bed rest for 12 hours.  4. She was told to take stool softener such as Colace or Surfak twice daily.  5. She was told to take warm tub baths three times daily starting tomorrow.  6. She was asked to return to see Dr. Marcy Panning in the office in one week.                                               Angelia Mould. Derrell Lolling, M.D.    HMI/MEDQ  D:  11/18/2002  T:  11/19/2002  Job:  [id number]

## 2011-01-04 ENCOUNTER — Inpatient Hospital Stay (INDEPENDENT_AMBULATORY_CARE_PROVIDER_SITE_OTHER)
Admission: RE | Admit: 2011-01-04 | Discharge: 2011-01-04 | Disposition: A | Discharge status: Home / Self Care | Payer: PRIVATE HEALTH INSURANCE | Source: Ambulatory Visit | Attending: Family Medicine | Admitting: Family Medicine

## 2011-01-04 DIAGNOSIS — M1990 Unspecified osteoarthritis, unspecified site: Secondary | ICD-10-CM

## 2011-01-05 ENCOUNTER — Other Ambulatory Visit: Payer: Self-pay | Admitting: Internal Medicine

## 2011-01-05 DIAGNOSIS — Z1231 Encounter for screening mammogram for malignant neoplasm of breast: Secondary | ICD-10-CM

## 2011-01-15 ENCOUNTER — Ambulatory Visit: Payer: PRIVATE HEALTH INSURANCE

## 2011-03-12 LAB — COMPREHENSIVE METABOLIC PANEL
ALT: 25
Albumin: 3.8
Alkaline Phosphatase: 106
Glucose, Bld: 135 — ABNORMAL HIGH
Potassium: 3.5
Sodium: 138
Total Protein: 7.7

## 2011-03-12 LAB — CBC
Hemoglobin: 14.3
Platelets: 216
RDW: 13.7

## 2011-03-12 LAB — DIFFERENTIAL
Basophils Relative: 0
Eosinophils Absolute: 0
Eosinophils Relative: 0
Monocytes Absolute: 0.3
Monocytes Relative: 8

## 2011-03-15 LAB — DIFFERENTIAL
Basophils Absolute: 0
Lymphocytes Relative: 39
Monocytes Absolute: 0.3
Neutro Abs: 3.2

## 2011-03-15 LAB — CBC
HCT: 38.7
MCV: 82.3
Platelets: 250
RDW: 15.3

## 2011-03-15 LAB — POCT CARDIAC MARKERS
CKMB, poc: 1.4
Myoglobin, poc: 66.9
Operator id: 161631
Troponin i, poc: <0.05

## 2011-03-15 LAB — LIPASE, BLOOD: Lipase: 23

## 2011-03-15 LAB — COMPREHENSIVE METABOLIC PANEL
Albumin: 4.2
BUN: 5 — ABNORMAL LOW
Creatinine, Ser: 0.58
Total Bilirubin: 0.7
Total Protein: 7.4

## 2011-06-27 ENCOUNTER — Emergency Department (HOSPITAL_COMMUNITY): Payer: PRIVATE HEALTH INSURANCE

## 2011-06-27 ENCOUNTER — Inpatient Hospital Stay (HOSPITAL_COMMUNITY)
Admission: EM | Admit: 2011-06-27 | Discharge: 2011-07-01 | DRG: 193 | Disposition: A | Discharge status: Home / Self Care | Payer: PRIVATE HEALTH INSURANCE | Source: Ambulatory Visit | Attending: Internal Medicine | Admitting: Internal Medicine

## 2011-06-27 ENCOUNTER — Encounter (HOSPITAL_COMMUNITY): Payer: Self-pay | Admitting: Internal Medicine

## 2011-06-27 DIAGNOSIS — Z880 Allergy status to penicillin: Secondary | ICD-10-CM

## 2011-06-27 DIAGNOSIS — T424X5A Adverse effect of benzodiazepines, initial encounter: Secondary | ICD-10-CM | POA: Diagnosis present

## 2011-06-27 DIAGNOSIS — Z886 Allergy status to analgesic agent status: Secondary | ICD-10-CM

## 2011-06-27 DIAGNOSIS — M545 Low back pain, unspecified: Secondary | ICD-10-CM | POA: Diagnosis present

## 2011-06-27 DIAGNOSIS — Z87440 Personal history of urinary (tract) infections: Secondary | ICD-10-CM

## 2011-06-27 DIAGNOSIS — G9340 Encephalopathy, unspecified: Secondary | ICD-10-CM | POA: Diagnosis present

## 2011-06-27 DIAGNOSIS — J189 Pneumonia, unspecified organism: Principal | ICD-10-CM | POA: Diagnosis present

## 2011-06-27 DIAGNOSIS — J9600 Acute respiratory failure, unspecified whether with hypoxia or hypercapnia: Secondary | ICD-10-CM | POA: Diagnosis present

## 2011-06-27 DIAGNOSIS — R404 Transient alteration of awareness: Secondary | ICD-10-CM | POA: Diagnosis not present

## 2011-06-27 DIAGNOSIS — J441 Chronic obstructive pulmonary disease with (acute) exacerbation: Secondary | ICD-10-CM | POA: Diagnosis present

## 2011-06-27 DIAGNOSIS — F17200 Nicotine dependence, unspecified, uncomplicated: Secondary | ICD-10-CM | POA: Diagnosis present

## 2011-06-27 DIAGNOSIS — IMO0001 Reserved for inherently not codable concepts without codable children: Secondary | ICD-10-CM | POA: Diagnosis present

## 2011-06-27 DIAGNOSIS — Z23 Encounter for immunization: Secondary | ICD-10-CM

## 2011-06-27 DIAGNOSIS — E119 Type 2 diabetes mellitus without complications: Secondary | ICD-10-CM | POA: Diagnosis present

## 2011-06-27 DIAGNOSIS — I10 Essential (primary) hypertension: Secondary | ICD-10-CM | POA: Diagnosis present

## 2011-06-27 DIAGNOSIS — T40605A Adverse effect of unspecified narcotics, initial encounter: Secondary | ICD-10-CM | POA: Diagnosis present

## 2011-06-27 DIAGNOSIS — R042 Hemoptysis: Secondary | ICD-10-CM | POA: Diagnosis present

## 2011-06-27 HISTORY — DX: Essential (primary) hypertension: I10

## 2011-06-27 LAB — CBC
MCH: 27.4 pg (ref 26.0–34.0)
MCHC: 34.6 g/dL (ref 30.0–36.0)
MCV: 79.1 fL (ref 78.0–100.0)
Platelets: 223 10*3/uL (ref 150–400)
RDW: 14.9 % (ref 11.5–15.5)
WBC: 14.8 10*3/uL — ABNORMAL HIGH (ref 4.0–10.5)

## 2011-06-27 LAB — PRO B NATRIURETIC PEPTIDE: Pro B Natriuretic peptide (BNP): 183.5 pg/mL — ABNORMAL HIGH (ref 0–125)

## 2011-06-27 LAB — DIFFERENTIAL
Basophils Absolute: 0 10*3/uL (ref 0.0–0.1)
Basophils Relative: 0 % (ref 0–1)
Eosinophils Absolute: 0 10*3/uL (ref 0.0–0.7)
Eosinophils Relative: 0 % (ref 0–5)
Lymphocytes Relative: 12 % (ref 12–46)

## 2011-06-27 LAB — BASIC METABOLIC PANEL
Calcium: 9.7 mg/dL (ref 8.4–10.5)
Creatinine, Ser: 0.73 mg/dL (ref 0.50–1.10)
GFR calc Af Amer: >90 mL/min (ref >90)
GFR calc non Af Amer: >90 mL/min (ref >90)
Sodium: 134 mEq/L — ABNORMAL LOW (ref 135–145)

## 2011-06-27 LAB — TROPONIN I: Troponin I: <0.3 ng/mL (ref <0.30)

## 2011-06-27 MED ORDER — ACETAMINOPHEN 325 MG PO TABS
650.0000 mg | ORAL_TABLET | Freq: Once | ORAL | Status: AC
  Administered 2011-06-27: 650 mg via ORAL
  Filled 2011-06-27: qty 2

## 2011-06-27 MED ORDER — IOHEXOL 300 MG/ML  SOLN
100.0000 mL | Freq: Once | INTRAMUSCULAR | Status: AC | PRN
  Administered 2011-06-27: 100 mL via INTRAVENOUS

## 2011-06-27 MED ORDER — MORPHINE SULFATE 4 MG/ML IJ SOLN
4.0000 mg | Freq: Once | INTRAMUSCULAR | Status: AC
  Administered 2011-06-27: 4 mg via INTRAVENOUS
  Filled 2011-06-27: qty 1

## 2011-06-27 MED ORDER — LORAZEPAM 2 MG/ML IJ SOLN
1.0000 mg | Freq: Once | INTRAMUSCULAR | Status: AC
  Administered 2011-06-27: 21:00:00 via INTRAVENOUS
  Filled 2011-06-27: qty 1

## 2011-06-27 MED ORDER — MOXIFLOXACIN HCL IN NACL 400 MG/250ML IV SOLN
400.0000 mg | Freq: Once | INTRAVENOUS | Status: AC
  Administered 2011-06-27: 400 mg via INTRAVENOUS
  Filled 2011-06-27: qty 250

## 2011-06-27 MED ORDER — SODIUM CHLORIDE 0.9 % IV SOLN: INTRAVENOUS | Status: DC

## 2011-06-27 MED ORDER — SODIUM CHLORIDE 0.9 % IV SOLN
INTRAVENOUS | Status: DC
  Administered 2011-06-27: 16:00:00 via INTRAVENOUS

## 2011-06-27 NOTE — ED Provider Notes (Signed)
History     CSN: [id number]  Arrival date & time 06/27/11  1236   First MD Initiated Contact with Patient 06/27/11 1550      Chief Complaint  Patient presents with  . Cough    (Consider location/radiation/quality/duration/timing/severity/associated sxs/prior treatment) HPI Patient presents to the emergency room with complaints of pain in her left side and productive cough. Patient states over the last 2 days she has been having some cough. She woke up this morning with a fever. Patient brought a family member in for a scheduled procedure when she suddenly started to feel much worse. Patient states she's been coughing up some blood. She's been having significant pain in left side of her chest sharp and increases with breathing and movement. She has not noticed any leg swelling. Patient does smoke cigarettes. No past medical history on file.  No past surgical history on file.  No family history on file.  History  Substance Use Topics  . Smoking status: Not on file  . Smokeless tobacco: Not on file  . Alcohol Use: Not on file    OB History    No data available      Review of Systems  All other systems reviewed and are negative.    Allergies  Ibuprofen and Penicillins  Home Medications   Current Outpatient Rx  Name Route Sig Dispense Refill  . AMLODIPINE BESYLATE 5 MG PO TABS Oral Take 5 mg by mouth daily.    [name] HYDROCHLOROTHIAZIDE 12.5 MG PO CAPS Oral Take 12.5 mg by mouth daily.    [name] HYDROCODONE-ACETAMINOPHEN 5-500 MG PO TABS Oral Take 1 tablet by mouth every 6 (six) hours as needed.    [name] NAPROXEN 500 MG PO TABS Oral Take 500 mg by mouth 2 (two) times daily with a meal.    . TRAMADOL HCL 50 MG PO TABS Oral Take 50 mg by mouth every 6 (six) hours as needed.      BP 153/73  Pulse 100  Temp(Src) 100.7 F (38.2 C) (Oral)  Resp 22  SpO2 93%  Physical Exam  Nursing note and vitals reviewed. Constitutional: She appears well-developed and well-nourished. No  distress.  HENT:  Head: Normocephalic and atraumatic.  Right Ear: External ear normal.  Left Ear: External ear normal.  Eyes: Conjunctivae are normal. Right eye exhibits no discharge. Left eye exhibits no discharge. No scleral icterus.  Neck: Neck supple. No tracheal deviation present.  Cardiovascular: Normal rate, regular rhythm and intact distal pulses.   Pulmonary/Chest: Effort normal. No stridor. No respiratory distress. She has wheezes. She has no rales. She exhibits tenderness.       Crackles left side  Abdominal: Soft. Bowel sounds are normal. She exhibits no distension. There is no tenderness. There is no rebound and no guarding.  Musculoskeletal: She exhibits no edema and no tenderness.  Neurological: She is alert. She has normal strength. No sensory deficit. Cranial nerve deficit:  no gross defecits noted. She exhibits normal muscle tone. She displays no seizure activity. Coordination normal.  Skin: Skin is warm and dry. No rash noted.  Psychiatric: She has a normal mood and affect.    ED Course  Procedures (including critical care time)  Labs Reviewed  CBC - Abnormal; Notable for the following:    WBC 14.8 (*)    RBC 5.30 (*)    All other components within normal limits  DIFFERENTIAL - Abnormal; Notable for the following:    Neutrophils Relative 82 (*)    Neutro  Abs 12.1 (*)    All other components within normal limits  BASIC METABOLIC PANEL - Abnormal; Notable for the following:    Sodium 134 (*)    Glucose, Bld 158 (*)    All other components within normal limits  PRO B NATRIURETIC PEPTIDE - Abnormal; Notable for the following:    Pro B Natriuretic peptide (BNP) 183.5 (*)    All other components within normal limits  TROPONIN I   Dg Chest 2 View  06/27/2011  *RADIOLOGY REPORT*  Clinical Data: Shortness of breath, cough and fever.  CHEST - 2 VIEW  Comparison: PA and lateral chest 08/29/2010.  Findings: There is cardiomegaly and interstitial pulmonary edema. Focal  airspace disease in the lingula is identified.  No pneumothorax or pleural effusion.  IMPRESSION: Cardiomegaly and interstitial edema.  More focal airspace opacity in the lingula could be due to pneumonia or more intense edema.  Original Report Authenticated By: Bernadene Bell. D'ALESSIO, M.D.    MDM   I suspect the patient has pneumonia. She has cough, fever and a possible infiltrate on the left side. I doubt congestive heart failure or pulmonary edema.  Patient has been given a dose of IV antibiotics and pain medications. Anticipate that she can be discharged home if we could get her more comfortable.       Celene Kras, MD 06/27/11 [phone]

## 2011-06-27 NOTE — ED Provider Notes (Signed)
Pt presented to ED w/ c/o cough x 5 days and severe, pleuritic, L lateral CP that started today.  CXR showed what appears to be lingular pneumonia and interstitial edema.  Pt received morphine, IV fluids and avelox and sent to ED for pending troponin and BNP.  Both labs unremarkable.  Pt appears very uncomfortable.  C/o severe pain left lateral chest.  Pain aggravated by deep inspiration and palpation on exam.  Lungs CTA.  HR 128.  Pt low risk but s/sx concerning for PE.  CT angio chest pending and second dose of morphine ordered. 6:53 PM   CT shows multifocal pneumonia.  Pt still tachycardic and tachypneic even while sleeping.  She is also diaphoretic and very drowsy and there is blood in her face mask.  Most recent dose of morphine 3hrs ago and received 1mg  IV ativan for anxiety during CT 2 hours ago.  ABG ordered.  Continues to c/o pain w/ movement.  [facility] consulted for admission.    Otilio Miu, [place] 06/28/11 1313

## 2011-06-27 NOTE — ED Notes (Signed)
To ed for eval of side pain and productive cough. Pt with fever. Denies weight loss. Lives with family at home. No out of the country travel.  Masked at triage

## 2011-06-27 NOTE — ED Notes (Signed)
Hospitalist at bedside 

## 2011-06-27 NOTE — ED Notes (Signed)
CONTACTS (daughters): Theora Gianotti [phone] Remonia Richter [phone]

## 2011-06-27 NOTE — ED Notes (Signed)
Report received, pt moved to CDU 2. Assumed care.

## 2011-06-28 ENCOUNTER — Inpatient Hospital Stay (HOSPITAL_COMMUNITY): Payer: PRIVATE HEALTH INSURANCE

## 2011-06-28 ENCOUNTER — Encounter (HOSPITAL_COMMUNITY): Payer: Self-pay | Admitting: *Deleted

## 2011-06-28 DIAGNOSIS — R042 Hemoptysis: Secondary | ICD-10-CM | POA: Diagnosis present

## 2011-06-28 DIAGNOSIS — J9600 Acute respiratory failure, unspecified whether with hypoxia or hypercapnia: Secondary | ICD-10-CM

## 2011-06-28 DIAGNOSIS — R0902 Hypoxemia: Secondary | ICD-10-CM

## 2011-06-28 DIAGNOSIS — J13 Pneumonia due to Streptococcus pneumoniae: Secondary | ICD-10-CM

## 2011-06-28 DIAGNOSIS — J441 Chronic obstructive pulmonary disease with (acute) exacerbation: Secondary | ICD-10-CM | POA: Diagnosis present

## 2011-06-28 DIAGNOSIS — F05 Delirium due to known physiological condition: Secondary | ICD-10-CM

## 2011-06-28 LAB — URINALYSIS, ROUTINE W REFLEX MICROSCOPIC
Ketones, ur: NEGATIVE mg/dL
Leukocytes, UA: NEGATIVE
Nitrite: NEGATIVE
Specific Gravity, Urine: 1.022 (ref 1.005–1.030)
pH: 5.5 (ref 5.0–8.0)

## 2011-06-28 LAB — RAPID URINE DRUG SCREEN, HOSP PERFORMED
Amphetamines: NOT DETECTED
Barbiturates: NOT DETECTED
Tetrahydrocannabinol: NOT DETECTED

## 2011-06-28 LAB — POCT I-STAT 3, ART BLOOD GAS (G3+): Acid-base deficit: 1 mmol/L (ref 0.0–2.0)

## 2011-06-28 LAB — COMPREHENSIVE METABOLIC PANEL
ALT: 10 U/L (ref 0–35)
AST: 15 U/L (ref 0–37)
Alkaline Phosphatase: 121 U/L — ABNORMAL HIGH (ref 39–117)
CO2: 26 mEq/L (ref 19–32)
GFR calc Af Amer: >90 mL/min (ref >90)
GFR calc non Af Amer: >90 mL/min (ref >90)
Glucose, Bld: 156 mg/dL — ABNORMAL HIGH (ref 70–99)
Potassium: 3.1 mEq/L — ABNORMAL LOW (ref 3.5–5.1)
Sodium: 136 mEq/L (ref 135–145)
Total Protein: 7.3 g/dL (ref 6.0–8.3)

## 2011-06-28 LAB — LEGIONELLA ANTIGEN, URINE

## 2011-06-28 LAB — BLOOD GAS, ARTERIAL
Acid-Base Excess: 2.8 mmol/L — ABNORMAL HIGH (ref 0.0–2.0)
Drawn by: 129711
FIO2: 0.28 %
O2 Content: 2 L/min
O2 Saturation: 94.2 %
Patient temperature: 98.6

## 2011-06-28 LAB — CULTURE, BLOOD (ROUTINE X 2)
Culture  Setup Time: 201301100854
Culture: NO GROWTH

## 2011-06-28 LAB — LACTIC ACID, PLASMA: Lactic Acid, Venous: 0.9 mmol/L (ref 0.5–2.2)

## 2011-06-28 LAB — EXPECTORATED SPUTUM ASSESSMENT W GRAM STAIN, RFLX TO RESP C

## 2011-06-28 LAB — MRSA PCR SCREENING: MRSA by PCR: NEGATIVE

## 2011-06-28 MED ORDER — METHYLPREDNISOLONE SODIUM SUCC 40 MG IJ SOLR
40.0000 mg | Freq: Two times a day (BID) | INTRAMUSCULAR | Status: DC
  Administered 2011-06-28 (×2): 40 mg via INTRAVENOUS
  Filled 2011-06-28 (×4): qty 1

## 2011-06-28 MED ORDER — ACETAMINOPHEN 650 MG RE SUPP: 650.0000 mg | Freq: Four times a day (QID) | RECTAL | Status: DC | PRN

## 2011-06-28 MED ORDER — MOXIFLOXACIN HCL IN NACL 400 MG/250ML IV SOLN
400.0000 mg | INTRAVENOUS | Status: DC
  Filled 2011-06-28: qty 250

## 2011-06-28 MED ORDER — TRAMADOL HCL 50 MG PO TABS
50.0000 mg | ORAL_TABLET | Freq: Four times a day (QID) | ORAL | Status: DC | PRN
  Administered 2011-06-28 – 2011-06-30 (×5): 50 mg via ORAL
  Filled 2011-06-28 (×4): qty 1

## 2011-06-28 MED ORDER — IPRATROPIUM BROMIDE 0.02 % IN SOLN
0.5000 mg | Freq: Four times a day (QID) | RESPIRATORY_TRACT | Status: DC
  Administered 2011-06-28 – 2011-06-30 (×7): 0.5 mg via RESPIRATORY_TRACT
  Filled 2011-06-28 (×7): qty 2.5

## 2011-06-28 MED ORDER — VANCOMYCIN HCL 1000 MG IV SOLR
2250.0000 mg | Freq: Once | INTRAVENOUS | Status: AC
  Administered 2011-06-28: 2250 mg via INTRAVENOUS
  Filled 2011-06-28: qty 2250

## 2011-06-28 MED ORDER — OSELTAMIVIR PHOSPHATE 75 MG PO CAPS
75.0000 mg | ORAL_CAPSULE | Freq: Two times a day (BID) | ORAL | Status: DC
  Administered 2011-06-28: 75 mg via ORAL
  Filled 2011-06-28 (×3): qty 1

## 2011-06-28 MED ORDER — POTASSIUM CHLORIDE CRYS ER 20 MEQ PO TBCR
40.0000 meq | EXTENDED_RELEASE_TABLET | Freq: Four times a day (QID) | ORAL | Status: AC
  Administered 2011-06-28 (×2): 40 meq via ORAL
  Filled 2011-06-28 (×2): qty 2

## 2011-06-28 MED ORDER — ALBUTEROL SULFATE (5 MG/ML) 0.5% IN NEBU
2.5000 mg | INHALATION_SOLUTION | Freq: Four times a day (QID) | RESPIRATORY_TRACT | Status: DC
  Administered 2011-06-28 – 2011-07-01 (×14): 2.5 mg via RESPIRATORY_TRACT
  Filled 2011-06-28 (×15): qty 0.5

## 2011-06-28 MED ORDER — BIOTENE DRY MOUTH MT LIQD
15.0000 mL | Freq: Two times a day (BID) | OROMUCOSAL | Status: DC
  Administered 2011-06-28 – 2011-07-01 (×5): 15 mL via OROMUCOSAL

## 2011-06-28 MED ORDER — VANCOMYCIN HCL IN DEXTROSE 1-5 GM/200ML-% IV SOLN
1000.0000 mg | Freq: Two times a day (BID) | INTRAVENOUS | Status: DC
  Administered 2011-06-28: 1000 mg via INTRAVENOUS
  Filled 2011-06-28 (×4): qty 200

## 2011-06-28 MED ORDER — RANITIDINE HCL 150 MG/10ML PO SYRP
150.0000 mg | ORAL_SOLUTION | Freq: Every day | ORAL | Status: DC
  Administered 2011-06-28 – 2011-06-30 (×3): 150 mg via ORAL
  Filled 2011-06-28 (×7): qty 10

## 2011-06-28 MED ORDER — PNEUMOCOCCAL VAC POLYVALENT 25 MCG/0.5ML IJ INJ
0.5000 mL | INJECTION | INTRAMUSCULAR | Status: DC
  Filled 2011-06-28: qty 0.5

## 2011-06-28 MED ORDER — NALOXONE HCL 0.4 MG/ML IJ SOLN: 0.4000 mg | Freq: Once | INTRAMUSCULAR | Status: DC

## 2011-06-28 MED ORDER — AMLODIPINE BESYLATE 5 MG PO TABS
5.0000 mg | ORAL_TABLET | Freq: Every day | ORAL | Status: DC
  Administered 2011-06-28 – 2011-07-01 (×4): 5 mg via ORAL
  Filled 2011-06-28 (×4): qty 1

## 2011-06-28 MED ORDER — LEVOFLOXACIN IN D5W 750 MG/150ML IV SOLN
750.0000 mg | Freq: Every day | INTRAVENOUS | Status: DC
  Administered 2011-06-28: 750 mg via INTRAVENOUS
  Filled 2011-06-28 (×2): qty 150

## 2011-06-28 MED ORDER — IPRATROPIUM BROMIDE 0.02 % IN SOLN
0.2500 mg | Freq: Four times a day (QID) | RESPIRATORY_TRACT | Status: DC
  Filled 2011-06-28: qty 2.5

## 2011-06-28 MED ORDER — ONDANSETRON HCL 4 MG/2ML IJ SOLN: 4.0000 mg | Freq: Four times a day (QID) | INTRAMUSCULAR | Status: DC | PRN

## 2011-06-28 MED ORDER — ALBUTEROL SULFATE (5 MG/ML) 0.5% IN NEBU
2.5000 mg | INHALATION_SOLUTION | RESPIRATORY_TRACT | Status: DC | PRN
Start: 2011-06-28 — End: 2011-07-01

## 2011-06-28 MED ORDER — HYDROCODONE-ACETAMINOPHEN 5-325 MG PO TABS: 1.0000 | ORAL_TABLET | Freq: Four times a day (QID) | ORAL | Status: DC | PRN

## 2011-06-28 MED ORDER — NICOTINE 14 MG/24HR TD PT24
14.0000 mg | MEDICATED_PATCH | Freq: Every day | TRANSDERMAL | Status: DC
  Administered 2011-06-28 – 2011-07-01 (×4): 14 mg via TRANSDERMAL
  Filled 2011-06-28 (×4): qty 1

## 2011-06-28 MED ORDER — ACETAMINOPHEN 325 MG PO TABS: 650.0000 mg | ORAL_TABLET | Freq: Four times a day (QID) | ORAL | Status: DC | PRN

## 2011-06-28 MED ORDER — INFLUENZA VIRUS VACC SPLIT PF IM SUSP
0.5000 mL | INTRAMUSCULAR | Status: AC
  Filled 2011-06-28: qty 0.5

## 2011-06-28 MED ORDER — ONDANSETRON HCL 4 MG PO TABS: 4.0000 mg | ORAL_TABLET | Freq: Four times a day (QID) | ORAL | Status: DC | PRN

## 2011-06-28 MED ORDER — SODIUM CHLORIDE 0.9 % IV SOLN
INTRAVENOUS | Status: DC
  Administered 2011-06-28: 1000 mL via INTRAVENOUS
  Administered 2011-06-28 (×2): via INTRAVENOUS

## 2011-06-28 NOTE — H&P (Signed)
Sarah [name] is an 56 y.o. female.   PCP - Unknown History obtained from ER PA, as patient is drowsy and confused. Patient's husband is at the bedside and providing very minimal history. Chief Complaint: Cough and left-sided chest pain. HPI: 55 year-old female with history of hypertension, ongoing tobacco abuse and chronic low back pain present to the ER because of ongoing productive cough for last 5 days with some left-sided chest pain which developed yesterday. In the ER patient had a CT angiogram of the chest which showed left-sided consolidation with multifocal pneumonia and it was negative for PE. Patient at this time is confused and not able to provide history. Patient did receive 4 mg IV morphine at 7 PM and 1 mg IV Ativan at 8 PM to get her CT angiogram done.  Past Medical History  Diagnosis Date  . Hypertension     History reviewed. No pertinent past surgical history.  History reviewed. No pertinent family history. Social History:  reports that she has been smoking.  She does not have any smokeless tobacco history on file. She reports that she does not drink alcohol. Her drug history not on file.  Allergies:  Allergies  Allergen Reactions  . Ibuprofen Other (See Comments)    unknown  . Penicillins Other (See Comments)    unknown    Medications Prior to Admission  Medication Dose Route Frequency Provider Last Rate Last Dose  . 0.9 %  sodium chloride infusion   Intravenous Continuous Celene Kras, MD 125 mL/hr at 06/27/11 1623    . 0.9 %  sodium chloride infusion   Intravenous STAT Otilio Miu, PA      . acetaminophen (TYLENOL) tablet 650 mg  650 mg Oral Once Otilio Miu, PA   650 mg at 06/27/11 2254  . iohexol (OMNIPAQUE) 300 MG/ML solution 100 mL  100 mL Intravenous Once PRN Medication Radiologist   100 mL at 06/27/11 2200  . LORazepam (ATIVAN) injection 1 mg  1 mg Intravenous Once [employer], PA      . morphine 4 MG/ML injection 4 mg  4 mg  Intravenous Once Celene Kras, MD   4 mg at 06/27/11 1626  . morphine 4 MG/ML injection 4 mg  4 mg Intravenous Once Otilio Miu, PA   4 mg at 06/27/11 1903  . moxifloxacin (AVELOX) IVPB 400 mg  400 mg Intravenous Once Celene Kras, MD   400 mg at 06/27/11 1631   No current outpatient prescriptions on file as of 06/27/2011.    Results for orders placed during the hospital encounter of 06/27/11 (from the past 48 hour(s))  CBC     Status: Abnormal   Collection Time   06/27/11  4:15 PM      Component Value Range Comment   WBC 14.8 (*) 4.0 - 10.5 (K/uL)    RBC 5.30 (*) 3.87 - 5.11 (MIL/uL)    Hemoglobin 14.5  12.0 - 15.0 (g/dL)    HCT 62.1  30.8 - 65.7 (%)    MCV 79.1  78.0 - 100.0 (fL)    MCH 27.4  26.0 - 34.0 (pg)    MCHC 34.6  30.0 - 36.0 (g/dL)    RDW 84.6  96.2 - 95.2 (%)    Platelets 223  150 - 400 (K/uL)   DIFFERENTIAL     Status: Abnormal   Collection Time   06/27/11  4:15 PM      Component Value Range Comment  Neutrophils Relative 82 (*) 43 - 77 (%)    Neutro Abs 12.1 (*) 1.7 - 7.7 (K/uL)    Lymphocytes Relative 12  12 - 46 (%)    Lymphs Abs 1.7  0.7 - 4.0 (K/uL)    Monocytes Relative 6  3 - 12 (%)    Monocytes Absolute 1.0  0.1 - 1.0 (K/uL)    Eosinophils Relative 0  0 - 5 (%)    Eosinophils Absolute 0.0  0.0 - 0.7 (K/uL)    Basophils Relative 0  0 - 1 (%)    Basophils Absolute 0.0  0.0 - 0.1 (K/uL)   BASIC METABOLIC PANEL     Status: Abnormal   Collection Time   06/27/11  4:15 PM      Component Value Range Comment   Sodium 134 (*) 135 - 145 (mEq/L)    Potassium 3.8  3.5 - 5.1 (mEq/L)    Chloride 97  96 - 112 (mEq/L)    CO2 24  19 - 32 (mEq/L)    Glucose, Bld 158 (*) 70 - 99 (mg/dL)    BUN 6  6 - 23 (mg/dL)    Creatinine, Ser 1.61  0.50 - 1.10 (mg/dL)    Calcium 9.7  8.4 - 10.5 (mg/dL)    GFR calc non Af Amer >90  >90 (mL/min)    GFR calc Af Amer >90  >90 (mL/min)   TROPONIN I     Status: Normal   Collection Time   06/27/11  4:16 PM      Component Value Range  Comment   Troponin I <0.30  <0.30 (ng/mL)   PRO B NATRIURETIC PEPTIDE     Status: Abnormal   Collection Time   06/27/11  4:16 PM      Component Value Range Comment   Pro B Natriuretic peptide (BNP) 183.5 (*) 0 - 125 (pg/mL)    Dg Chest 2 View  06/27/2011  *RADIOLOGY REPORT*  Clinical Data: Shortness of breath, cough and fever.  CHEST - 2 VIEW  Comparison: PA and lateral chest 08/29/2010.  Findings: There is cardiomegaly and interstitial pulmonary edema. Focal airspace disease in the lingula is identified.  No pneumothorax or pleural effusion.  IMPRESSION: Cardiomegaly and interstitial edema.  More focal airspace opacity in the lingula could be due to pneumonia or more intense edema.  Original Report Authenticated By: Bernadene Bell. Maricela Curet, M.D.   Ct Angio Chest W/cm &/or Wo Cm  06/27/2011  *RADIOLOGY REPORT*  Clinical Data: Pleuritic left chest pain.  Tachycardia.  CT ANGIOGRAPHY CHEST  Technique:  Multidetector CT imaging of the chest using the standard protocol during bolus administration of intravenous contrast. Multiplanar reconstructed images including MIPs were obtained and reviewed to evaluate the vascular anatomy.  Comparison: 06/27/2011.  Findings: There is dense left lower lobe airspace disease/consolidation, most compatible with pneumonia.  Follow-up to ensure clearing is recommended.  Pulmonary arterial hypertension is present.  Mediastinal and left hilar adenopathy is present, which is probably reactive.  Aortic arch atherosclerosis.  There is bolus dispersion on the examination that precludes evaluation of pulmonary embolus in the small vessels.  No central pulmonary embolus is present.  No pericardial effusion.  No pleural effusion. Incidental imaging of the upper abdomen demonstrates an indeterminant 22 mm left adrenal nodule and a right adrenal nodule. Both adrenal nodules appear little changed compared to prior exam of 12/22/2007, likely representing adenomas.  Similar appearance of the  adrenal glands compared to 2006 compatible with benign process.  No aggressive osseous lesions.  Thoracic spondylosis. 7 mm pulmonary nodule in the right upper lobe is unchanged compared to 2009, compatible with a benign etiology.  There is patchy airspace disease in the right lung mixed with atelectasis.  IMPRESSION: 1.  Dense left lower lobe superior segment consolidation most compatible with pneumonia. Airspace disease in the other lobes is more faint.  The appearance is compatible with multifocal pneumonia.  Radiographic follow-up is recommended to ensure clearing and exclude an underlying lesion.  Radiographic clearing is usually observed at 4-6 weeks. 2.  Technically suboptimal CTA of the chest due to bolus dispersion.  No central pulmonary embolus.  No acute aortic abnormality. 3.  Pulmonary arterial hypertension. 4.  Mediastinal and hilar adenopathy is likely reactive to pneumonia. 5.  Unchanged bilateral adrenal nodules likely adenoma.  Original Report Authenticated By: Andreas Newport, M.D.    Review of Systems  Constitutional: Positive for fever.  HENT: Negative.   Eyes: Negative.   Respiratory: Positive for cough and sputum production.   Cardiovascular: Positive for chest pain.  Gastrointestinal: Negative.   Genitourinary: Negative.   Musculoskeletal: Negative.   Skin: Negative.   Neurological: Negative.   Endo/Heme/Allergies: Negative.   Psychiatric/Behavioral: Negative.     Blood pressure 176/62, pulse 116, temperature 100.5 F (38.1 C), temperature source Oral, resp. rate 28, SpO2 100.00%. Physical Exam  Constitutional: She appears well-developed and well-nourished. No distress.  HENT:  Head: Normocephalic and atraumatic.  Right Ear: External ear normal.  Left Ear: External ear normal.  Nose: Nose normal.  Mouth/Throat: Oropharynx is clear and moist. No oropharyngeal exudate.  Eyes: Conjunctivae and EOM are normal. Pupils are equal, round, and reactive to light. Right eye  exhibits no discharge. Left eye exhibits no discharge. No scleral icterus.  Neck: Normal range of motion. Neck supple.  Cardiovascular:       Tachycardia regular rhythm.  Respiratory: Effort normal. She has wheezes.  GI: Soft. Bowel sounds are normal. She exhibits no distension. There is no tenderness. There is no rebound.  Musculoskeletal: Normal range of motion. She exhibits no edema and no tenderness.  Neurological:       Patient is drowsy and less confused, is not following commands. Moves all extremities.  Skin: Skin is warm. She is diaphoretic.     Assessment/Plan #1. Multifocal pneumonia - patient has been started on Avelox will continue that. We'll add Tamiflu and check flu PCR. Patient will be on droplet precautions. Patient's chest x-ray should be followed until resolution as patient is a cigarettes smoker. We'll check procalcitonin level. #2. Encephalopathy - the ER PA Ms.Schenliver told me that patient is alert awake oriented but last 2 hours she is very confused. She did get morphine and Ativan. We think this may be the cause for her confusion. We'll closely observe her overnight. Check urine drug screen. #3. Left-sided pleuritic chest pain - this could be from the pneumonia. #4. History of hypertension - continue amlodipine hold HCTZ. #5. History of tobacco abuse - will need tobacco cessation counseling.  CODE STATUS - full code.  Wendolyn Raso N. 06/28/2011, 12:03 AM

## 2011-06-28 NOTE — Progress Notes (Signed)
Order to transfer to ICU.  Report called to [facility] RN [id number].  Pt belongings packed.  Family notified of transfer.  Pt to transfer in bed.  Dr. Kendrick Fries notified of K 3.1, he will enter orders.

## 2011-06-28 NOTE — Consult Note (Signed)
Name: Sarah [name] MRN: [id number] DOB: [birth year]    LOS: 1  PCCM CONSULT NOTE  History of Present Illness: 56 yo female active smoker with hx HTN, who presented 1/9 with 5 day hx productive cough, SOB, malaise and L sided pleuritic chest pain.  ER workup revealed multifocal PNA and pt was admitted by [facility] to SDU.  On 1/10 developed increased SOB and hypoxia and PCCM consulted.    Lines / Drains: none  Cultures/sepsis markers: Flu 1/10>>> NEG  Sputum 1/10>>> Urine 1/10>>> BC x 2 1/10>>> procalcitonin 1/10 = 0.93 Lactate 1/10>>> Ur st/leg ag 1/10>>  Antibiotics: Tamiflu 1/9>>> Avelox 1/9>>>1/10 Vanc 1/10>>> Levaquin 1/10>>>   Tests / Events: CTA chest 1/9>>>1. Dense left lower lobe superior segment consolidation most compatible with pneumonia. Airspace disease in the other lobes is more faint. The appearance is compatible with multifocal pneumonia. Radiographic follow-up is recommended to ensure clearing and exclude an underlying lesion. 2. Technically suboptimal CTA of the chest due to bolus dispersion. No central pulmonary embolus. No acute aortic abnormality. 3. Pulmonary arterial hypertension. 4. Mediastinal and hilar adenopathy is likely reactive to pneumonia. 5. Unchanged bilateral adrenal nodules likely adenoma   Past Medical History  Diagnosis Date  . Hypertension    History reviewed. No pertinent past surgical history. Prior to Admission medications   Medication Sig Start Date End Date Taking? Authorizing Provider  amLODipine (NORVASC) 5 MG tablet Take 5 mg by mouth daily.   Yes Historical Provider, MD  hydrochlorothiazide (MICROZIDE) 12.5 MG capsule Take 12.5 mg by mouth daily.   Yes Historical Provider, MD  HYDROcodone-acetaminophen (VICODIN) 5-500 MG per tablet Take 1 tablet by mouth every 6 (six) hours as needed.   Yes Historical Provider, MD  naproxen (NAPROSYN) 500 MG tablet Take 500 mg by mouth 2 (two) times daily with a meal.   Yes Historical Provider, MD    traMADol (ULTRAM) 50 MG tablet Take 50 mg by mouth every 6 (six) hours as needed.   Yes Historical Provider, MD   Allergies Allergies  Allergen Reactions  . Ibuprofen Other (See Comments)    unknown  . Penicillins Other (See Comments)    unknown    Family History History reviewed. No pertinent family history.  Social History History   Social History  . Marital Status: Married    Spouse Name: N/A    Number of Children: N/A  . Years of Education: N/A   Occupational History  . Not on file.   Social History Main Topics  . Smoking status: Current Everyday Smoker -- 1.5 packs/day for 40 years    Types: Cigarettes  . Smokeless tobacco: Not on file  . Alcohol Use: No  . Drug Use: Not on file  . Sexually Active: Yes   Other Topics Concern  . Not on file   Social History Narrative  . No narrative on file    Review Of Systems   Pt states she has been feeling sick x 2 weeks with productive cough (brown/rust colored sputum), SOB and malaise.  Also c/o pleuritic chest pain, worse with cough and inspiration.  Has had some chills but denies fever.  Denies n/v/d, syncope, headache, weight loss, orthopnea, PND.  All other systems reviewed and were neg.   Vital Signs: Temp:  [96.4 F (35.8 C)-101.3 F (38.5 C)] 96.4 F (35.8 C) (01/10 0800) Pulse Rate:  [90-116] 90  (01/10 0800) Resp:  [18-32] 25  (01/10 0800) BP: (121-182)/(52-96) 148/90 mmHg (01/10 0800) SpO2:  [91 %-  100 %] 96 % (01/10 0821) FiO2 (%):  [0 %] 0 % (01/10 0057) Weight:  [235 lb 14.3 oz (107 kg)] 235 lb 14.3 oz (107 kg) (01/10 0056) I/O last 3 completed shifts: In: 833.3 [I.V.:833.3] Out: -   Physical Examination: General: chronically ill appearing female, mild resp distress Neuro:  Somnolent, arousable, answers questions appropriately, did receive 8mg  Morphine and 1mg  Ativan approx 10 hrs ago.  CV:  s1 s2 distant PULM: resps even, mildly labored on [place], coarse L>R, exp wheeze  GI: abd soft, nt, nd,  +bs Extremities:  Warm and dry, scant BLE edema    Labs and Imaging:    CBC    Component Value Date/Time   WBC 14.8* 06/27/2011 1615   RBC 5.30* 06/27/2011 1615   HGB 14.5 06/27/2011 1615   HCT 41.9 06/27/2011 1615   PLT 223 06/27/2011 1615   MCV 79.1 06/27/2011 1615   MCH 27.4 06/27/2011 1615   MCHC 34.6 06/27/2011 1615   RDW 14.9 06/27/2011 1615   LYMPHSABS 1.7 06/27/2011 1615   MONOABS 1.0 06/27/2011 1615   EOSABS 0.0 06/27/2011 1615   BASOSABS 0.0 06/27/2011 1615    BMET    Component Value Date/Time   NA 134* 06/27/2011 1615   K 3.8 06/27/2011 1615   CL 97 06/27/2011 1615   CO2 24 06/27/2011 1615   GLUCOSE 158* 06/27/2011 1615   BUN 6 06/27/2011 1615   CREATININE 0.73 06/27/2011 1615   CALCIUM 9.7 06/27/2011 1615   GFRNONAA >90 06/27/2011 1615   GFRAA >90 06/27/2011 1615    Dg Chest 2 View  06/27/2011  *RADIOLOGY REPORT*  Clinical Data: Shortness of breath, cough and fever.  CHEST - 2 VIEW  Comparison: PA and lateral chest 08/29/2010.  Findings: There is cardiomegaly and interstitial pulmonary edema. Focal airspace disease in the lingula is identified.  No pneumothorax or pleural effusion.  IMPRESSION: Cardiomegaly and interstitial edema.  More focal airspace opacity in the lingula could be due to pneumonia or more intense edema.  Original Report Authenticated By: Bernadene Bell. Maricela Curet, M.D.   Ct Angio Chest W/cm &/or Wo Cm  06/27/2011  *RADIOLOGY REPORT*  Clinical Data: Pleuritic left chest pain.  Tachycardia.  CT ANGIOGRAPHY CHEST  Technique:  Multidetector CT imaging of the chest using the standard protocol during bolus administration of intravenous contrast. Multiplanar reconstructed images including MIPs were obtained and reviewed to evaluate the vascular anatomy.  Comparison: 06/27/2011.  Findings: There is dense left lower lobe airspace disease/consolidation, most compatible with pneumonia.  Follow-up to ensure clearing is recommended.  Pulmonary arterial hypertension is present.  Mediastinal and left hilar  adenopathy is present, which is probably reactive.  Aortic arch atherosclerosis.  There is bolus dispersion on the examination that precludes evaluation of pulmonary embolus in the small vessels.  No central pulmonary embolus is present.  No pericardial effusion.  No pleural effusion. Incidental imaging of the upper abdomen demonstrates an indeterminant 22 mm left adrenal nodule and a right adrenal nodule. Both adrenal nodules appear little changed compared to prior exam of 12/22/2007, likely representing adenomas.  Similar appearance of the adrenal glands compared to 2006 compatible with benign process.  No aggressive osseous lesions.  Thoracic spondylosis. 7 mm pulmonary nodule in the right upper lobe is unchanged compared to 2009, compatible with a benign etiology.  There is patchy airspace disease in the right lung mixed with atelectasis.  IMPRESSION: 1.  Dense left lower lobe superior segment consolidation most compatible with pneumonia. Airspace  disease in the other lobes is more faint.  The appearance is compatible with multifocal pneumonia.  Radiographic follow-up is recommended to ensure clearing and exclude an underlying lesion.  Radiographic clearing is usually observed at 4-6 weeks. 2.  Technically suboptimal CTA of the chest due to bolus dispersion.  No central pulmonary embolus.  No acute aortic abnormality. 3.  Pulmonary arterial hypertension. 4.  Mediastinal and hilar adenopathy is likely reactive to pneumonia. 5.  Unchanged bilateral adrenal nodules likely adenoma.  Original Report Authenticated By: Andreas Newport, M.D.    ABG    Component Value Date/Time   PHART 7.442* 06/28/2011 0841   PCO2ART 39.8 06/28/2011 0841   PO2ART 65.6* 06/28/2011 0841   HCO3 26.7* 06/28/2011 0841   TCO2 27.9 06/28/2011 0841   ACIDBASEDEF 1.0 06/28/2011 0008   O2SAT 94.2 06/28/2011 0841     Assessment and Plan: 1. Acute respiratory failure in setting severe CAP with likely underlying COPD.  Pt somnolent but not  hypercarbic.  PO2=65 on 2L [place].  Resp status tenuous, high potential for decompensation.  Cultures pending.  Flu swab neg.  CXR this am with worsening L infiltrate with LLL atx/collapse. Some mild wheeze, do not think that this a COPD exacerbation.  PLAN -  -Vanc/Avelox -Pct, lactate -Increase O2 -F/u CXR -likely d/c steroids in am, follow resp status -a/a nebs -d/c tamiflu tx ICU due to mental status -re-examine this afternoon for decompensation  2. Delirium: no clear etiology, favor pneumonia, but perhaps related to morphine?  -narcan now -tx ICU -CT head -u/a  3. Increased size of PA on CT chest, pulmonary hypertension? -2D TTE   Best practices / Disposition: -->ICU status under PCCM -->full code -->SCD's for DVT Px -->Protonix for GI Px -->NPO -->family updated at bedside    [facility], NP 06/28/2011  9:29 AM  *Care during the described time interval was provided by me and/or other providers on the critical care team. I have reviewed this patient's available data, including medical history, events of note, physical examination and test results as part of my evaluation.  Caldonia Leap

## 2011-06-28 NOTE — Progress Notes (Signed)
  Infectious Disease Pharmacy     Total Antibiotics Day 1         Day #1 Vancomycin         Day #1 Levofloxacin            Sarah [name] is an 56 y.o. female who presented to [facility] on 06/27/2011 with a chief complaint of  Chief Complaint  Patient presents with  . Cough   Assessment: Patient antibiotics to be changed to Vancomycin and Levofloxacin for severe CAP.  Plan:  Vancomycin 2.25g IV x 1 dose, then 1g IV q12.  Will follow cultures and renal function with CCM team.  Celedonio Miyamoto, PharmD, BCPS [phone]  Filed Vitals:   06/28/11 0945  BP: 173/94  Pulse:   Temp:   Resp:     Lab 06/27/11 1615  NA 134*  K 3.8  CL 97  CO2 24  GLUCOSE 158*  BUN 6  CREATININE 0.73  CALCIUM 9.7  MG --  PHOS --    Lab 06/27/11 1615  WBC 14.8*    Past Medical History  Diagnosis Date  . Hypertension       Recent Results (from the past 720 hour(s))  MRSA PCR SCREENING     Status: Normal   Collection Time   06/28/11 12:49 AM      Component Value Range Status Comment   MRSA by PCR NEGATIVE  NEGATIVE  Final   CULTURE, SPUTUM-ASSESSMENT     Status: Normal   Collection Time   06/28/11  9:15 AM      Component Value Range Status Comment   Specimen Description SPUTUM   Final    Special Requests NONE   Final    Sputum evaluation     Final    Value: MICROSCOPIC FINDINGS SUGGEST THAT THIS SPECIMEN IS NOT REPRESENTATIVE OF LOWER RESPIRATORY SECRETIONS. PLEASE RECOLLECT.     CALLED TO Lelon Huh RN 06/28/11 1025 COSTELLO B   Report Status 06/28/2011 FINAL   Final

## 2011-06-28 NOTE — Progress Notes (Signed)
Subjective: 56 year-old female with history of hypertension, ongoing tobacco abuse and chronic low back pain present to the ER because of ongoing productive cough for last 5 days with some left-sided chest pain which developed yesterday. In the ER patient had a CT angiogram of the chest which showed left-sided consolidation with multifocal pneumonia and it was negative for PE.   This AM the patient is drowsy and barely arousable. She falls quickly back to sleep. She c/o severe left side chest pain. Coughing up blood.   Physical Exam: Blood pressure 121/52, pulse 90, temperature 97.4 F (36.3 C), temperature source Oral, resp. rate 21, height 5\' 8"  (1.727 m), weight 107 kg (235 lb 14.3 oz), SpO2 96.00%. stuporous Oriented to place  Chest - bilat wheezes and crackles Cor: tachy, regular, no murmurs Abdomen; soft, nt   Investigations:  Recent Results (from the past 240 hour(s))  MRSA PCR SCREENING     Status: Normal   Collection Time   06/28/11 12:49 AM      Component Value Range Status Comment   MRSA by PCR NEGATIVE  NEGATIVE  Final      Basic Metabolic Panel:  Basename 06/27/11 1615  NA 134*  K 3.8  CL 97  CO2 24  GLUCOSE 158*  BUN 6  CREATININE 0.73  CALCIUM 9.7  MG --  PHOS --   Liver Function Tests: No results found for this basename: AST:2,ALT:2,ALKPHOS:2,BILITOT:2,PROT:2,ALBUMIN:2 in the last 72 hours   CBC:  Basename 06/27/11 1615  WBC 14.8*  NEUTROABS 12.1*  HGB 14.5  HCT 41.9  MCV 79.1  PLT 223    Dg Chest 2 View  06/27/2011  *RADIOLOGY REPORT*  Clinical Data: Shortness of breath, cough and fever.  CHEST - 2 VIEW  Comparison: PA and lateral chest 08/29/2010.  Findings: There is cardiomegaly and interstitial pulmonary edema. Focal airspace disease in the lingula is identified.  No pneumothorax or pleural effusion.  IMPRESSION: Cardiomegaly and interstitial edema.  More focal airspace opacity in the lingula could be due to pneumonia or more intense edema.   Original Report Authenticated By: Bernadene Bell. Maricela Curet, M.D.   Ct Angio Chest W/cm &/or Wo Cm  06/27/2011  *RADIOLOGY REPORT*  Clinical Data: Pleuritic left chest pain.  Tachycardia.  CT ANGIOGRAPHY CHEST  Technique:  Multidetector CT imaging of the chest using the standard protocol during bolus administration of intravenous contrast. Multiplanar reconstructed images including MIPs were obtained and reviewed to evaluate the vascular anatomy.  Comparison: 06/27/2011.  Findings: There is dense left lower lobe airspace disease/consolidation, most compatible with pneumonia.  Follow-up to ensure clearing is recommended.  Pulmonary arterial hypertension is present.  Mediastinal and left hilar adenopathy is present, which is probably reactive.  Aortic arch atherosclerosis.  There is bolus dispersion on the examination that precludes evaluation of pulmonary embolus in the small vessels.  No central pulmonary embolus is present.  No pericardial effusion.  No pleural effusion. Incidental imaging of the upper abdomen demonstrates an indeterminant 22 mm left adrenal nodule and a right adrenal nodule. Both adrenal nodules appear little changed compared to prior exam of 12/22/2007, likely representing adenomas.  Similar appearance of the adrenal glands compared to 2006 compatible with benign process.  No aggressive osseous lesions.  Thoracic spondylosis. 7 mm pulmonary nodule in the right upper lobe is unchanged compared to 2009, compatible with a benign etiology.  There is patchy airspace disease in the right lung mixed with atelectasis.  IMPRESSION: 1.  Dense left lower lobe superior  segment consolidation most compatible with pneumonia. Airspace disease in the other lobes is more faint.  The appearance is compatible with multifocal pneumonia.  Radiographic follow-up is recommended to ensure clearing and exclude an underlying lesion.  Radiographic clearing is usually observed at 4-6 weeks. 2.  Technically suboptimal CTA of the  chest due to bolus dispersion.  No central pulmonary embolus.  No acute aortic abnormality. 3.  Pulmonary arterial hypertension. 4.  Mediastinal and hilar adenopathy is likely reactive to pneumonia. 5.  Unchanged bilateral adrenal nodules likely adenoma.  Original Report Authenticated By: Andreas Newport, M.D.      Medications:  Scheduled:    . acetaminophen  650 mg Oral Once  . albuterol  2.5 mg Nebulization Q6H  . amLODipine  5 mg Oral Daily  . antiseptic oral rinse  15 mL Mouth Rinse BID  . influenza  inactive virus vaccine  0.5 mL Intramuscular Tomorrow-1000  . LORazepam  1 mg Intravenous Once  . methylPREDNISolone (SOLU-MEDROL) injection  40 mg Intravenous Q12H  .  morphine injection  4 mg Intravenous Once  .  morphine injection  4 mg Intravenous Once  . moxifloxacin  400 mg Intravenous Once  . moxifloxacin  400 mg Intravenous Q24H  . nicotine  14 mg Transdermal Daily  . oseltamivir  75 mg Oral BID  . pneumococcal 23 valent vaccine  0.5 mL Intramuscular Tomorrow-1000  . DISCONTD: sodium chloride   Intravenous STAT    Impression: 56 yo woman with acute respiratory failure from multilobar pneumonia, hemoptysis, copd exacerbation. Principal Problem:  *Pneumonia Active Problems:  COPD with acute exacerbation  Hemoptysis  HTN (hypertension)  TOBACCO ABUSE  LOW BACK PAIN     Plan: Repeat abg pccm consult Add steroids Cont abx      LOS: 1 day   Brayan Votaw, MD Pager: [phone] 06/28/2011, 8:57 AM

## 2011-06-28 NOTE — ED Provider Notes (Signed)
Medical screening examination/treatment/procedure(s) were conducted as a shared visit with non-physician practitioner(s) and myself.  I personally evaluated the patient during the encounter  Pt did not improve with ED treatment.  Admitted for further treatment.  Celene Kras, MD 06/28/11 [phone]

## 2011-06-29 ENCOUNTER — Inpatient Hospital Stay (HOSPITAL_COMMUNITY): Payer: PRIVATE HEALTH INSURANCE

## 2011-06-29 LAB — CBC
Hemoglobin: 12.6 g/dL (ref 12.0–15.0)
MCH: 26.6 pg (ref 26.0–34.0)
MCV: 78.2 fL (ref 78.0–100.0)
Platelets: 221 10*3/uL (ref 150–400)
RBC: 4.73 MIL/uL (ref 3.87–5.11)

## 2011-06-29 LAB — CULTURE, RESPIRATORY W GRAM STAIN

## 2011-06-29 LAB — BASIC METABOLIC PANEL
BUN: 8 mg/dL (ref 6–23)
CO2: 23 mEq/L (ref 19–32)
Calcium: 9.5 mg/dL (ref 8.4–10.5)
Creatinine, Ser: 0.6 mg/dL (ref 0.50–1.10)
Glucose, Bld: 222 mg/dL — ABNORMAL HIGH (ref 70–99)

## 2011-06-29 MED ORDER — PNEUMOCOCCAL VAC POLYVALENT 25 MCG/0.5ML IJ INJ: 0.5000 mL | INJECTION | INTRAMUSCULAR | Status: DC | PRN

## 2011-06-29 MED ORDER — NAPROXEN 375 MG PO TABS
375.0000 mg | ORAL_TABLET | Freq: Two times a day (BID) | ORAL | Status: DC
  Administered 2011-06-29 – 2011-07-01 (×4): 375 mg via ORAL
  Filled 2011-06-29 (×8): qty 1

## 2011-06-29 MED ORDER — LEVOFLOXACIN 750 MG PO TABS
750.0000 mg | ORAL_TABLET | Freq: Every day | ORAL | Status: DC
  Administered 2011-06-29 – 2011-07-01 (×3): 750 mg via ORAL
  Filled 2011-06-29 (×3): qty 1

## 2011-06-29 MED ORDER — NAPROXEN 375 MG PO TABS
375.0000 mg | ORAL_TABLET | Freq: Two times a day (BID) | ORAL | Status: DC
  Filled 2011-06-29 (×2): qty 1

## 2011-06-29 MED ORDER — SODIUM CHLORIDE 0.9 % IV SOLN: INTRAVENOUS | Status: DC

## 2011-06-29 NOTE — Progress Notes (Signed)
Name: Sarah [name] MRN: [id number] DOB: [birth year]    LOS: 2  PCCM PROGRESS NOTE  History of Present Illness: 56 yo female active smoker with hx HTN, who presented 1/9 with 5 day hx productive cough, SOB, malaise and L sided pleuritic chest pain.  ER workup revealed multifocal PNA and pt was admitted by [facility] to SDU.  On 1/10 developed increased SOB and hypoxia and PCCM consulted.    Lines / Drains: none  Cultures/sepsis markers: Flu 1/10>>> NEG  Sputum 1/10>>>NGTD Urine 1/10>>>NGTD BC x 2 1/10>>>NGTD procalcitonin 1/10 = 0.93 Lactate 1/10>>> Ur st/leg ag 1/10>>st neg  Antibiotics: Tamiflu 1/9>>>1/10 Avelox 1/9>>>1/10 Vanc 1/10>>>1/11 Levaquin 1/10>>>1/11 (changed to po)   Tests / Events: CTA chest 1/9>>>1. Dense left lower lobe superior segment consolidation most compatible with pneumonia. Airspace disease in the other lobes is more faint. The appearance is compatible with multifocal pneumonia. Radiographic follow-up is recommended to ensure clearing and exclude an underlying lesion. 2. Technically suboptimal CTA of the chest due to bolus dispersion. No central pulmonary embolus. No acute aortic abnormality. 3. Pulmonary arterial hypertension. 4. Mediastinal and hilar adenopathy is likely reactive to pneumonia. 5. Unchanged bilateral adrenal nodules likely adenoma  1/10 CT Head: limited study, no obvious acute intracranial pathology  Overnight: Much improved overnight, mental status clear today  Vital Signs: Temp:  [97.6 F (36.4 C)-98.4 F (36.9 C)] 98.3 F (36.8 C) (01/11 0831) Pulse Rate:  [80-107] 89  (01/11 0700) Resp:  [12-30] 24  (01/11 0700) BP: (115-173)/(56-94) 138/73 mmHg (01/11 0700) SpO2:  [99 %-100 %] 100 % (01/11 0800) Weight:  [105.2 kg (231 lb 14.8 oz)] 105.2 kg (231 lb 14.8 oz) (01/10 1310) I/O last 3 completed shifts: In: 5043.3 [P.O.:560; I.V.:3583.3; IV Piggyback:900] Out: 3900 [Urine:3900]  Physical Examination: General: awake, alert, no acute  distress Neuro:  Alert and oriented x4, cn ii-xii intact, maew  CV:  s1 s2 distant PULM: resps even, crackles in bases bilaterally, worse in left GI: abd soft, nt, nd, +bs Extremities:  Warm and dry, scant BLE edema  Derm: no rash or skin breakdown   Labs and Imaging:    CBC    Component Value Date/Time   WBC 12.2* 06/29/2011 0542   RBC 4.73 06/29/2011 0542   HGB 12.6 06/29/2011 0542   HCT 37.0 06/29/2011 0542   PLT 221 06/29/2011 0542   MCV 78.2 06/29/2011 0542   MCH 26.6 06/29/2011 0542   MCHC 34.1 06/29/2011 0542   RDW 14.9 06/29/2011 0542   LYMPHSABS 1.7 06/27/2011 1615   MONOABS 1.0 06/27/2011 1615   EOSABS 0.0 06/27/2011 1615   BASOSABS 0.0 06/27/2011 1615    BMET    Component Value Date/Time   NA 135 06/29/2011 0542   K 3.9 06/29/2011 0542   CL 104 06/29/2011 0542   CO2 23 06/29/2011 0542   GLUCOSE 222* 06/29/2011 0542   BUN 8 06/29/2011 0542   CREATININE 0.60 06/29/2011 0542   CALCIUM 9.5 06/29/2011 0542   GFRNONAA >90 06/29/2011 0542   GFRAA >90 06/29/2011 0542    Dg Chest 2 View  06/27/2011  *RADIOLOGY REPORT*  Clinical Data: Shortness of breath, cough and fever.  CHEST - 2 VIEW  Comparison: PA and lateral chest 08/29/2010.  Findings: There is cardiomegaly and interstitial pulmonary edema. Focal airspace disease in the lingula is identified.  No pneumothorax or pleural effusion.  IMPRESSION: Cardiomegaly and interstitial edema.  More focal airspace opacity in the lingula could be due to pneumonia or more  intense edema.  Original Report Authenticated By: Bernadene Bell. Maricela Curet, M.D.   Ct Head Wo Contrast  06/28/2011  *RADIOLOGY REPORT*  Clinical Data: Altered mental status, confusion, history hypertension, COPD  CT HEAD WITHOUT CONTRAST  Technique:  Contiguous axial images were obtained from the base of the skull through the vertex without contrast.  Comparison: Prior exam from 2004predates PACs and is unavailable for comparison.  Findings: Motion artifacts despite repeating images. Normal  ventricular morphology. No midline shift or mass effect. Within the limitation of motion, no definite intracranial hemorrhage, mass lesion or evidence of acute infarction. No extra-axial fluid collection. Small amount fluid or mucous dependently in left maxillary sinus. Partial opacification of a few right ethmoid air cells. Bones unremarkable.  IMPRESSION: Limitations of exam secondary to patient motion. No definite acute intracranial abnormalities identified.  Original Report Authenticated By: Lollie Marrow, M.D.   Ct Angio Chest W/cm &/or Wo Cm  06/27/2011  *RADIOLOGY REPORT*  Clinical Data: Pleuritic left chest pain.  Tachycardia.  CT ANGIOGRAPHY CHEST  Technique:  Multidetector CT imaging of the chest using the standard protocol during bolus administration of intravenous contrast. Multiplanar reconstructed images including MIPs were obtained and reviewed to evaluate the vascular anatomy.  Comparison: 06/27/2011.  Findings: There is dense left lower lobe airspace disease/consolidation, most compatible with pneumonia.  Follow-up to ensure clearing is recommended.  Pulmonary arterial hypertension is present.  Mediastinal and left hilar adenopathy is present, which is probably reactive.  Aortic arch atherosclerosis.  There is bolus dispersion on the examination that precludes evaluation of pulmonary embolus in the small vessels.  No central pulmonary embolus is present.  No pericardial effusion.  No pleural effusion. Incidental imaging of the upper abdomen demonstrates an indeterminant 22 mm left adrenal nodule and a right adrenal nodule. Both adrenal nodules appear little changed compared to prior exam of 12/22/2007, likely representing adenomas.  Similar appearance of the adrenal glands compared to 2006 compatible with benign process.  No aggressive osseous lesions.  Thoracic spondylosis. 7 mm pulmonary nodule in the right upper lobe is unchanged compared to 2009, compatible with a benign etiology.  There is  patchy airspace disease in the right lung mixed with atelectasis.  IMPRESSION: 1.  Dense left lower lobe superior segment consolidation most compatible with pneumonia. Airspace disease in the other lobes is more faint.  The appearance is compatible with multifocal pneumonia.  Radiographic follow-up is recommended to ensure clearing and exclude an underlying lesion.  Radiographic clearing is usually observed at 4-6 weeks. 2.  Technically suboptimal CTA of the chest due to bolus dispersion.  No central pulmonary embolus.  No acute aortic abnormality. 3.  Pulmonary arterial hypertension. 4.  Mediastinal and hilar adenopathy is likely reactive to pneumonia. 5.  Unchanged bilateral adrenal nodules likely adenoma.  Original Report Authenticated By: Andreas Newport, M.D.   Dg Chest Port 1 View  06/29/2011  *RADIOLOGY REPORT*  Clinical Data: Pneumonia.  PORTABLE CHEST - 1 VIEW  Comparison: 06/28/2011  Findings: Cardiomegaly with bilateral airspace opacities, most confluent in the left lower lobe.  No real change since prior study.  No definite effusions.  No acute bony abnormality.  IMPRESSION: Bilateral airspace opacities, most confluent in the left lower lobe, unchanged.  Original Report Authenticated By: Cyndie Chime, M.D.   Dg Chest Port 1 View  06/28/2011  *RADIOLOGY REPORT*  Clinical Data: Pneumonia, cough, weakness, shortness of breath  PORTABLE CHEST - 1 VIEW  Comparison: June 27, 2011  Findings: Left lower lobe  consolidated infiltrate has increased. No infiltrate is present on the right.  The cardiac silhouette, mediastinum, pulmonary vasculature are within normal limits.  IMPRESSION: Increase in size of left lower lobe consolidated infiltrate.  Original Report Authenticated By: Brandon Melnick, M.D.    ABG    Component Value Date/Time   PHART 7.442* 06/28/2011 0841   PCO2ART 39.8 06/28/2011 0841   PO2ART 65.6* 06/28/2011 0841   HCO3 26.7* 06/28/2011 0841   TCO2 27.9 06/28/2011 0841   ACIDBASEDEF 1.0  06/28/2011 0008   O2SAT 94.2 06/28/2011 0841     Assessment and Plan: 1.Severe CAP: much improved  PLAN -  -d/c vanc, continue oral levaquin -d/c steroids -a/a nebs prn  2. Delirium: no clear etiology, favor pneumonia, but likely related to pneumonia as she has been much more awake and alert  -CT head negative   3. Increased size of PA on CT chest, pulmonary hypertension? -2D TTE pending   Best practices / Disposition: -->transfer to [facility], floor bed today -->full code -->SCD's for DVT Px -->Protonix for GI Px -->NPO -->family updated at bedside   Max Fickle Pager [phone]

## 2011-06-29 NOTE — Progress Notes (Signed)
Dr. Kendrick Fries was in to see pt and pt will tx to tele today..the patient is complaining of back pain and requesting ultram.  Pharmacy was notified for the dose

## 2011-06-29 NOTE — Progress Notes (Signed)
Patient transferring to [id number]. Report given to [place], Charity fundraiser. Patient AxOx3. VSS.

## 2011-06-30 LAB — CBC
HCT: 36.4 % (ref 36.0–46.0)
MCH: 26.7 pg (ref 26.0–34.0)
MCHC: 33.8 g/dL (ref 30.0–36.0)
MCV: 79 fL (ref 78.0–100.0)
RDW: 15.2 % (ref 11.5–15.5)

## 2011-06-30 LAB — BASIC METABOLIC PANEL
BUN: 12 mg/dL (ref 6–23)
Calcium: 9.5 mg/dL (ref 8.4–10.5)
Creatinine, Ser: 0.66 mg/dL (ref 0.50–1.10)
GFR calc Af Amer: >90 mL/min (ref >90)
GFR calc non Af Amer: >90 mL/min (ref >90)
Glucose, Bld: 176 mg/dL — ABNORMAL HIGH (ref 70–99)

## 2011-06-30 MED ORDER — POTASSIUM CHLORIDE CRYS ER 20 MEQ PO TBCR
40.0000 meq | EXTENDED_RELEASE_TABLET | Freq: Two times a day (BID) | ORAL | Status: AC
  Administered 2011-06-30 (×2): 40 meq via ORAL
  Filled 2011-06-30 (×2): qty 2

## 2011-06-30 MED ORDER — PREDNISONE 50 MG PO TABS
50.0000 mg | ORAL_TABLET | Freq: Every day | ORAL | Status: DC
  Administered 2011-06-30 – 2011-07-01 (×2): 50 mg via ORAL
  Filled 2011-06-30 (×3): qty 1

## 2011-06-30 MED ORDER — BENZONATATE 100 MG PO CAPS
200.0000 mg | ORAL_CAPSULE | Freq: Three times a day (TID) | ORAL | Status: DC
  Administered 2011-06-30 – 2011-07-01 (×3): 200 mg via ORAL
  Filled 2011-06-30 (×5): qty 2

## 2011-06-30 NOTE — Progress Notes (Signed)
Subjective: 56 year-old female with history of hypertension, ongoing tobacco abuse and chronic low back pain present to the ER because of ongoing productive cough for last 5 days with some left-sided chest pain which developed yesterday. In the ER patient had a CT angiogram of the chest which showed left-sided consolidation with multifocal pneumonia and it was negative for PE.  Patient was transferred to the ICU for 48 hours for better monitoring.  Transferred out of MICU 06/30/11   Physical Exam: Blood pressure 144/82, pulse 91, temperature 98.1 F (36.7 C), temperature source Oral, resp. rate 20, height 5\' 8"  (1.727 m), weight 105.2 kg (231 lb 14.8 oz), SpO2 92.00%. Patient Vitals for the past 24 hrs:  BP Temp Temp src Pulse Resp SpO2  06/30/11 0700 - - - - - 92 %  06/30/11 0500 144/82 mmHg 98.1 F (36.7 C) Oral 91  20  91 %  06/29/11 2112 176/86 mmHg 98.3 F (36.8 C) Axillary 89  20  99 %  06/29/11 2109 - - - - - 95 %  06/29/11 1841 170/85 mmHg 98.4 F (36.9 C) - 97  19  97 %  06/29/11 1705 139/72 mmHg 98.2 F (36.8 C) Oral 98  22  96 %  06/29/11 1438 - - - - - 95 %  06/29/11 1200 113/75 mmHg - - - 19  -  06/29/11 1159 113/75 mmHg 98.3 F (36.8 C) Oral 91  25  99 %    Alert and oriented x3 Cvs: rrr Rs: bilat rhonchi and wheezes abdomen obese, NT  Investigations:  Recent Results (from the past 240 hour(s))  MRSA PCR SCREENING     Status: Normal   Collection Time   06/28/11 12:49 AM      Component Value Range Status Comment   MRSA by PCR NEGATIVE  NEGATIVE  Final   CULTURE, BLOOD (ROUTINE X 2)     Status: Normal (Preliminary result)   Collection Time   06/28/11  1:33 AM      Component Value Range Status Comment   Specimen Description BLOOD LEFT HAND   Final    Special Requests BOTTLES DRAWN AEROBIC ONLY 5CC   Final    Setup Time [id number]   Final    Culture     Final    Value:        BLOOD CULTURE RECEIVED NO GROWTH TO DATE CULTURE WILL BE HELD FOR 5 DAYS BEFORE  ISSUING A FINAL NEGATIVE REPORT   Report Status PENDING   Incomplete   CULTURE, BLOOD (ROUTINE X 2)     Status: Normal (Preliminary result)   Collection Time   06/28/11  1:34 AM      Component Value Range Status Comment   Specimen Description BLOOD RIGHT HAND   Final    Special Requests BOTTLES DRAWN AEROBIC ONLY 5CC   Final    Setup Time [id number]   Final    Culture     Final    Value:        BLOOD CULTURE RECEIVED NO GROWTH TO DATE CULTURE WILL BE HELD FOR 5 DAYS BEFORE ISSUING A FINAL NEGATIVE REPORT   Report Status PENDING   Incomplete   CULTURE, SPUTUM-ASSESSMENT     Status: Normal   Collection Time   06/28/11  9:15 AM      Component Value Range Status Comment   Specimen Description SPUTUM   Final    Special Requests NONE   Final    Sputum evaluation  Final    Value: MICROSCOPIC FINDINGS SUGGEST THAT THIS SPECIMEN IS NOT REPRESENTATIVE OF LOWER RESPIRATORY SECRETIONS. PLEASE RECOLLECT.     CALLED TO CURRAN L RN 06/28/11 1025 COSTELLO B   Report Status 06/28/2011 FINAL   Final   CULTURE, SPUTUM-ASSESSMENT     Status: Normal   Collection Time   06/28/11  7:14 PM      Component Value Range Status Comment   Specimen Description SPUTUM   Final    Special Requests NONE   Final    Sputum evaluation     Final    Value: THIS SPECIMEN IS ACCEPTABLE. RESPIRATORY CULTURE REPORT TO FOLLOW.   Report Status 06/29/2011 FINAL   Final   CULTURE, RESPIRATORY     Status: Normal (Preliminary result)   Collection Time   06/28/11  7:14 PM      Component Value Range Status Comment   Specimen Description SPUTUM   Final    Special Requests NONE   Final    Gram Stain     Final    Value: FEW WBC PRESENT,BOTH PMN AND MONONUCLEAR     NO SQUAMOUS EPITHELIAL CELLS SEEN     NO ORGANISMS SEEN   Culture PENDING   Incomplete    Report Status PENDING   Incomplete      Basic Metabolic Panel:  Basename 06/30/11 0500 06/29/11 0542  NA 138 135  K 3.2* 3.9  CL 104 104  CO2 24 23  GLUCOSE 176* 222*    BUN 12 8  CREATININE 0.66 0.60  CALCIUM 9.5 9.5  MG -- --  PHOS -- --   Liver Function Tests:  Basename 06/28/11 1032  AST 15  ALT 10  ALKPHOS 121*  BILITOT 0.7  PROT 7.3  ALBUMIN 3.1*     CBC:  Basename 06/30/11 0500 06/29/11 0542 06/27/11 1615  WBC 10.4 12.2* --  NEUTROABS -- -- 12.1*  HGB 12.3 12.6 --  HCT 36.4 37.0 --  MCV 79.0 78.2 --  PLT 258 221 --    Ct Head Wo Contrast  06/28/2011  *RADIOLOGY REPORT*  Clinical Data: Altered mental status, confusion, history hypertension, COPD  CT HEAD WITHOUT CONTRAST  Technique:  Contiguous axial images were obtained from the base of the skull through the vertex without contrast.  Comparison: Prior exam from 2004predates PACs and is unavailable for comparison.  Findings: Motion artifacts despite repeating images. Normal ventricular morphology. No midline shift or mass effect. Within the limitation of motion, no definite intracranial hemorrhage, mass lesion or evidence of acute infarction. No extra-axial fluid collection. Small amount fluid or mucous dependently in left maxillary sinus. Partial opacification of a few right ethmoid air cells. Bones unremarkable.  IMPRESSION: Limitations of exam secondary to patient motion. No definite acute intracranial abnormalities identified.  Original Report Authenticated By: Lollie Marrow, M.D.   Dg Chest Port 1 View  06/29/2011  *RADIOLOGY REPORT*  Clinical Data: Pneumonia.  PORTABLE CHEST - 1 VIEW  Comparison: 06/28/2011  Findings: Cardiomegaly with bilateral airspace opacities, most confluent in the left lower lobe.  No real change since prior study.  No definite effusions.  No acute bony abnormality.  IMPRESSION: Bilateral airspace opacities, most confluent in the left lower lobe, unchanged.  Original Report Authenticated By: Cyndie Chime, M.D.   Dg Chest Port 1 View  06/28/2011  *RADIOLOGY REPORT*  Clinical Data: Pneumonia, cough, weakness, shortness of breath  PORTABLE CHEST - 1 VIEW   Comparison: June 27, 2011  Findings: Left  lower lobe consolidated infiltrate has increased. No infiltrate is present on the right.  The cardiac silhouette, mediastinum, pulmonary vasculature are within normal limits.  IMPRESSION: Increase in size of left lower lobe consolidated infiltrate.  Original Report Authenticated By: Brandon Melnick, M.D.      Medications:  Scheduled:    . albuterol  2.5 mg Nebulization Q6H  . amLODipine  5 mg Oral Daily  . antiseptic oral rinse  15 mL Mouth Rinse BID  . influenza  inactive virus vaccine  0.5 mL Intramuscular Tomorrow-1000  . ipratropium  0.5 mg Nebulization Q6H  . levofloxacin  750 mg Oral Daily  . naloxone (NARCAN) injection  0.4 mg Intravenous Once  . naproxen  375 mg Oral BID WC  . nicotine  14 mg Transdermal Daily  . potassium chloride  40 mEq Oral BID  . ranitidine  150 mg Oral QHS  . DISCONTD: levofloxacin (LEVAQUIN) IV  750 mg Intravenous Daily  . DISCONTD: methylPREDNISolone (SOLU-MEDROL) injection  40 mg Intravenous Q12H  . DISCONTD: naproxen  375 mg Oral BID WC  . DISCONTD: pneumococcal 23 valent vaccine  0.5 mL Intramuscular Tomorrow-1000  . DISCONTD: vancomycin  1,000 mg Intravenous Q12H    Impression: Principal Problem:  *Pneumonia Active Problems:  COPD with acute exacerbation  Hemoptysis  HTN (hypertension)  TOBACCO ABUSE  LOW BACK PAIN     Plan: Continue current abx Mobilize Add prednisone       LOS: 3 days   Goldye Tourangeau, MD Pager: [phone] 06/30/2011, 8:56 AM

## 2011-07-01 ENCOUNTER — Encounter (HOSPITAL_COMMUNITY): Payer: Self-pay | Admitting: Internal Medicine

## 2011-07-01 DIAGNOSIS — E119 Type 2 diabetes mellitus without complications: Secondary | ICD-10-CM | POA: Diagnosis present

## 2011-07-01 LAB — BASIC METABOLIC PANEL
Calcium: 9.6 mg/dL (ref 8.4–10.5)
GFR calc Af Amer: >90 mL/min (ref >90)
GFR calc non Af Amer: >90 mL/min (ref >90)
Potassium: 4.7 mEq/L (ref 3.5–5.1)
Sodium: 135 mEq/L (ref 135–145)

## 2011-07-01 LAB — CBC
MCH: 26.6 pg (ref 26.0–34.0)
MCHC: 34.1 g/dL (ref 30.0–36.0)
RDW: 15.3 % (ref 11.5–15.5)

## 2011-07-01 LAB — HEMOGLOBIN A1C: Mean Plasma Glucose: 163 mg/dL — ABNORMAL HIGH (ref <117)

## 2011-07-01 MED ORDER — INSULIN ASPART 100 UNIT/ML ~~LOC~~ SOLN: 0.0000 [IU] | Freq: Every day | SUBCUTANEOUS | Status: DC

## 2011-07-01 MED ORDER — INSULIN ASPART 100 UNIT/ML ~~LOC~~ SOLN
0.0000 [IU] | Freq: Three times a day (TID) | SUBCUTANEOUS | Status: DC
  Filled 2011-07-01: qty 3

## 2011-07-01 MED ORDER — ALBUTEROL SULFATE (5 MG/ML) 0.5% IN NEBU: 2.5000 mg | INHALATION_SOLUTION | RESPIRATORY_TRACT | Status: DC | PRN

## 2011-07-01 MED ORDER — DOXYCYCLINE HYCLATE 100 MG PO TABS: 100.0000 mg | ORAL_TABLET | Freq: Two times a day (BID) | ORAL | Status: AC

## 2011-07-01 MED ORDER — INSULIN ASPART 100 UNIT/ML ~~LOC~~ SOLN
3.0000 [IU] | Freq: Three times a day (TID) | SUBCUTANEOUS | Status: DC
  Administered 2011-07-01: 3 [IU] via SUBCUTANEOUS

## 2011-07-01 MED ORDER — PREDNISONE (PAK) 10 MG PO TABS: 10.0000 mg | ORAL_TABLET | ORAL | Status: AC

## 2011-07-01 NOTE — Progress Notes (Signed)
Pt being discharged today, she pulled her IV site out. Discharge instructions reviewed with patient and meds discussed. Kriss Perleberg,RN.

## 2011-07-01 NOTE — Discharge Summary (Signed)
Patient ID: Sarah [name] MRN: [id number] DOB/AGE: 1955-12-25 56 y.o. Primary Care Physician:No primary provider on file. Admit date: 06/27/2011 Discharge date: 07/01/2011    Discharge Diagnoses:   Principal Problem:  *Pneumonia Active Problems:  COPD with acute exacerbation  Hemoptysis  HTN (hypertension)  TOBACCO ABUSE  LOW BACK PAIN  Diabetes type 2, uncontrolled   Medication List  As of 07/01/2011  3:42 PM   START taking these medications         albuterol (5 MG/ML) 0.5% nebulizer solution   Commonly known as: PROVENTIL   Take 0.5 mLs (2.5 mg total) by nebulization every 4 (four) hours as needed for wheezing.      doxycycline 100 MG tablet   Commonly known as: VIBRA-TABS   Take 1 tablet (100 mg total) by mouth 2 (two) times daily.      predniSONE 10 MG tablet   Commonly known as: STERAPRED UNI-PAK   Take 1 tablet (10 mg total) by mouth See admin instructions. 3/day for 2 days then 2/day for 2 days then 1/day for 2 days then stop         CONTINUE taking these medications         amLODipine 5 MG tablet   Commonly known as: NORVASC      hydrochlorothiazide 12.5 MG capsule   Commonly known as: MICROZIDE      HYDROcodone-acetaminophen 5-500 MG per tablet   Commonly known as: VICODIN      naproxen 500 MG tablet   Commonly known as: NAPROSYN      traMADol 50 MG tablet   Commonly known as: ULTRAM          Where to get your medications    These are the prescriptions that you need to pick up.   You may get these medications from any pharmacy.         albuterol (5 MG/ML) 0.5% nebulizer solution   doxycycline 100 MG tablet   predniSONE 10 MG tablet            Discharged Condition:good   Consults:pulmonary  Significant Diagnostic Studies: Dg Chest 2 View  06/27/2011  *RADIOLOGY REPORT*  Clinical Data: Shortness of breath, cough and fever.  CHEST - 2 VIEW  Comparison: PA and lateral chest 08/29/2010.  Findings: There is cardiomegaly and interstitial  pulmonary edema. Focal airspace disease in the lingula is identified.  No pneumothorax or pleural effusion.  IMPRESSION: Cardiomegaly and interstitial edema.  More focal airspace opacity in the lingula could be due to pneumonia or more intense edema.  Original Report Authenticated By: Bernadene Bell. Maricela Curet, M.D.   Ct Head Wo Contrast  06/28/2011  *RADIOLOGY REPORT*  Clinical Data: Altered mental status, confusion, history hypertension, COPD  CT HEAD WITHOUT CONTRAST  Technique:  Contiguous axial images were obtained from the base of the skull through the vertex without contrast.  Comparison: Prior exam from 2004predates PACs and is unavailable for comparison.  Findings: Motion artifacts despite repeating images. Normal ventricular morphology. No midline shift or mass effect. Within the limitation of motion, no definite intracranial hemorrhage, mass lesion or evidence of acute infarction. No extra-axial fluid collection. Small amount fluid or mucous dependently in left maxillary sinus. Partial opacification of a few right ethmoid air cells. Bones unremarkable.  IMPRESSION: Limitations of exam secondary to patient motion. No definite acute intracranial abnormalities identified.  Original Report Authenticated By: Lollie Marrow, M.D.   Ct Angio Chest W/cm &/or Wo Cm  06/27/2011  *RADIOLOGY REPORT*  Clinical Data: Pleuritic left chest pain.  Tachycardia.  CT ANGIOGRAPHY CHEST  Technique:  Multidetector CT imaging of the chest using the standard protocol during bolus administration of intravenous contrast. Multiplanar reconstructed images including MIPs were obtained and reviewed to evaluate the vascular anatomy.  Comparison: 06/27/2011.  Findings: There is dense left lower lobe airspace disease/consolidation, most compatible with pneumonia.  Follow-up to ensure clearing is recommended.  Pulmonary arterial hypertension is present.  Mediastinal and left hilar adenopathy is present, which is probably reactive.  Aortic  arch atherosclerosis.  There is bolus dispersion on the examination that precludes evaluation of pulmonary embolus in the small vessels.  No central pulmonary embolus is present.  No pericardial effusion.  No pleural effusion. Incidental imaging of the upper abdomen demonstrates an indeterminant 22 mm left adrenal nodule and a right adrenal nodule. Both adrenal nodules appear little changed compared to prior exam of 12/22/2007, likely representing adenomas.  Similar appearance of the adrenal glands compared to 2006 compatible with benign process.  No aggressive osseous lesions.  Thoracic spondylosis. 7 mm pulmonary nodule in the right upper lobe is unchanged compared to 2009, compatible with a benign etiology.  There is patchy airspace disease in the right lung mixed with atelectasis.  IMPRESSION: 1.  Dense left lower lobe superior segment consolidation most compatible with pneumonia. Airspace disease in the other lobes is more faint.  The appearance is compatible with multifocal pneumonia.  Radiographic follow-up is recommended to ensure clearing and exclude an underlying lesion.  Radiographic clearing is usually observed at 4-6 weeks. 2.  Technically suboptimal CTA of the chest due to bolus dispersion.  No central pulmonary embolus.  No acute aortic abnormality. 3.  Pulmonary arterial hypertension. 4.  Mediastinal and hilar adenopathy is likely reactive to pneumonia. 5.  Unchanged bilateral adrenal nodules likely adenoma.  Original Report Authenticated By: Andreas Newport, M.D.   Dg Chest Port 1 View  06/29/2011  *RADIOLOGY REPORT*  Clinical Data: Pneumonia.  PORTABLE CHEST - 1 VIEW  Comparison: 06/28/2011  Findings: Cardiomegaly with bilateral airspace opacities, most confluent in the left lower lobe.  No real change since prior study.  No definite effusions.  No acute bony abnormality.  IMPRESSION: Bilateral airspace opacities, most confluent in the left lower lobe, unchanged.  Original Report Authenticated  By: Cyndie Chime, M.D.   Dg Chest Port 1 View  06/28/2011  *RADIOLOGY REPORT*  Clinical Data: Pneumonia, cough, weakness, shortness of breath  PORTABLE CHEST - 1 VIEW  Comparison: June 27, 2011  Findings: Left lower lobe consolidated infiltrate has increased. No infiltrate is present on the right.  The cardiac silhouette, mediastinum, pulmonary vasculature are within normal limits.  IMPRESSION: Increase in size of left lower lobe consolidated infiltrate.  Original Report Authenticated By: Brandon Melnick, M.D.    2D echo Procedure narrative: Transthoracic echocardiography. Image quality was adequate. The study was technically difficult. - Left ventricle: The cavity size was normal. Wall thickness was increased in a pattern of moderate LVH. Systolic function was normal. The estimated ejection fraction was in the range of 60% to 65%. Wall motion was normal; there were no regional wall motion abnormalities. - Left atrium: The atrium was moderately dilated. - Right ventricle: The cavity size was mildly dilated. Wall thickness was normal. - Right atrium: The atrium was mildly dilated.    Lab Results: Results for orders placed during the hospital encounter of 06/27/11 (from the past 48 hour(s))  CBC     Status: Normal  Collection Time   06/30/11  5:00 AM      Component Value Range Comment   WBC 10.4  4.0 - 10.5 (K/uL)    RBC 4.61  3.87 - 5.11 (MIL/uL)    Hemoglobin 12.3  12.0 - 15.0 (g/dL)    HCT 62.9  52.8 - 41.3 (%)    MCV 79.0  78.0 - 100.0 (fL)    MCH 26.7  26.0 - 34.0 (pg)    MCHC 33.8  30.0 - 36.0 (g/dL)    RDW 24.4  01.0 - 27.2 (%)    Platelets 258  150 - 400 (K/uL)   BASIC METABOLIC PANEL     Status: Abnormal   Collection Time   06/30/11  5:00 AM      Component Value Range Comment   Sodium 138  135 - 145 (mEq/L)    Potassium 3.2 (*) 3.5 - 5.1 (mEq/L) DELTA CHECK NOTED   Chloride 104  96 - 112 (mEq/L)    CO2 24  19 - 32 (mEq/L)    Glucose, Bld 176 (*) 70 - 99 (mg/dL)      BUN 12  6 - 23 (mg/dL)    Creatinine, Ser 5.36  0.50 - 1.10 (mg/dL)    Calcium 9.5  8.4 - 10.5 (mg/dL)    GFR calc non Af Amer >90  >90 (mL/min)    GFR calc Af Amer >90  >90 (mL/min)   CBC     Status: Normal   Collection Time   07/01/11  6:00 AM      Component Value Range Comment   WBC 7.4  4.0 - 10.5 (K/uL)    RBC 4.96  3.87 - 5.11 (MIL/uL)    Hemoglobin 13.2  12.0 - 15.0 (g/dL)    HCT 64.4  03.4 - 74.2 (%)    MCV 78.0  78.0 - 100.0 (fL)    MCH 26.6  26.0 - 34.0 (pg)    MCHC 34.1  30.0 - 36.0 (g/dL)    RDW 59.5  63.8 - 75.6 (%)    Platelets 289  150 - 400 (K/uL)   BASIC METABOLIC PANEL     Status: Abnormal   Collection Time   07/01/11  6:00 AM      Component Value Range Comment   Sodium 135  135 - 145 (mEq/L)    Potassium 4.7  3.5 - 5.1 (mEq/L)    Chloride 101  96 - 112 (mEq/L)    CO2 23  19 - 32 (mEq/L)    Glucose, Bld 204 (*) 70 - 99 (mg/dL)    BUN 9  6 - 23 (mg/dL)    Creatinine, Ser 4.33  0.50 - 1.10 (mg/dL)    Calcium 9.6  8.4 - 10.5 (mg/dL)    GFR calc non Af Amer >90  >90 (mL/min)    GFR calc Af Amer >90  >90 (mL/min)    Recent Results (from the past 240 hour(s))  MRSA PCR SCREENING     Status: Normal   Collection Time   06/28/11 12:49 AM      Component Value Range Status Comment   MRSA by PCR NEGATIVE  NEGATIVE  Final   CULTURE, BLOOD (ROUTINE X 2)     Status: Normal (Preliminary result)   Collection Time   06/28/11  1:33 AM      Component Value Range Status Comment   Specimen Description BLOOD LEFT HAND   Final    Special Requests BOTTLES DRAWN AEROBIC ONLY 5CC  Final    Setup Time [id number]   Final    Culture     Final    Value:        BLOOD CULTURE RECEIVED NO GROWTH TO DATE CULTURE WILL BE HELD FOR 5 DAYS BEFORE ISSUING A FINAL NEGATIVE REPORT   Report Status PENDING   Incomplete   CULTURE, BLOOD (ROUTINE X 2)     Status: Normal (Preliminary result)   Collection Time   06/28/11  1:34 AM      Component Value Range Status Comment   Specimen  Description BLOOD RIGHT HAND   Final    Special Requests BOTTLES DRAWN AEROBIC ONLY 5CC   Final    Setup Time [id number]   Final    Culture     Final    Value:        BLOOD CULTURE RECEIVED NO GROWTH TO DATE CULTURE WILL BE HELD FOR 5 DAYS BEFORE ISSUING A FINAL NEGATIVE REPORT   Report Status PENDING   Incomplete   CULTURE, SPUTUM-ASSESSMENT     Status: Normal   Collection Time   06/28/11  9:15 AM      Component Value Range Status Comment   Specimen Description SPUTUM   Final    Special Requests NONE   Final    Sputum evaluation     Final    Value: MICROSCOPIC FINDINGS SUGGEST THAT THIS SPECIMEN IS NOT REPRESENTATIVE OF LOWER RESPIRATORY SECRETIONS. PLEASE RECOLLECT.     CALLED TO CURRAN L RN 06/28/11 1025 COSTELLO B   Report Status 06/28/2011 FINAL   Final   CULTURE, SPUTUM-ASSESSMENT     Status: Normal   Collection Time   06/28/11  7:14 PM      Component Value Range Status Comment   Specimen Description SPUTUM   Final    Special Requests NONE   Final    Sputum evaluation     Final    Value: THIS SPECIMEN IS ACCEPTABLE. RESPIRATORY CULTURE REPORT TO FOLLOW.   Report Status 06/29/2011 FINAL   Final   CULTURE, RESPIRATORY     Status: Normal   Collection Time   06/28/11  7:14 PM      Component Value Range Status Comment   Specimen Description SPUTUM   Final    Special Requests NONE   Final    Gram Stain     Final    Value: FEW WBC PRESENT,BOTH PMN AND MONONUCLEAR     NO SQUAMOUS EPITHELIAL CELLS SEEN     NO ORGANISMS SEEN   Culture NORMAL OROPHARYNGEAL FLORA   Final    Report Status 07/01/2011 FINAL   Final      Hospital Course:  56 year old woman with a history of tobacco abuse, asthma, hypertension, morbid obesity presented emergency room with complaints of severe chest pain and shortness of breath. She received sedating medications and was taken to the CT scan to r/o PE. She was found to have instead severe bilateral pneumonia. Patient was started promptly on antibiotics with  Avelox. By hospital day #2 the patient was lethargic with increased respiratory drive and wheezes. She was started on steroids and antibiotics were continued. Critical care medicine saw the patient and they provided care for 48 hours. She started improving and was transferred to the floor on 06/29/11. She remained hemodynamically stable and without any oxygen requirement.  Plan is to finish antibiotics and steroids taper in the outpatient setting.    Discharge Exam: Blood pressure 148/84, pulse 80, temperature 97.8 F (  36.6 C), temperature source Oral, resp. rate 16, height 5\' 8"  (1.727 m), weight 105.2 kg (231 lb 14.8 oz), SpO2 92.00%. Alert and oriented x3 CVS: RRR RS; CTAB Abdomen; soft, nt   Disposition: follow up with [facility]  Discharge Orders    Future Orders Please Complete By Expires   Diet Carb Modified      Increase activity slowly           Signed: Lamarco Gudiel 07/01/2011, 3:42 PM

## 2011-07-01 NOTE — Progress Notes (Signed)
Subjective: 56 year-old female with history of hypertension, ongoing tobacco abuse and chronic low back pain present to the ER because of ongoing productive cough for last 5 days with some left-sided chest pain. In the ER patient had a CT angiogram of the chest which showed left-sided consolidation with multifocal pneumonia and it was negative for PE.  Patient was transferred to the ICU for 48 hours for better monitoring.  Transferred out of MICU 06/30/11   Physical Exam: Blood pressure 148/84, pulse 80, temperature 97.8 F (36.6 C), temperature source Oral, resp. rate 16, height 5\' 8"  (1.727 m), weight 105.2 kg (231 lb 14.8 oz), SpO2 92.00%. Patient Vitals for the past 24 hrs:  BP Temp Temp src Pulse Resp SpO2  07/01/11 0620 148/84 mmHg 97.8 F (36.6 C) - 80  16  92 %  07/01/11 0154 - - - - - 95 %  06/30/11 2300 165/89 mmHg - - - - -  06/30/11 2210 175/110 mmHg 97.9 F (36.6 C) - 93  16  95 %  06/30/11 1950 - - - - - 93 %  06/30/11 1500 - - - - - 97 %  06/30/11 1300 169/89 mmHg 97.5 F (36.4 C) Oral 89  18  94 %     Investigations:  Recent Results (from the past 240 hour(s))  MRSA PCR SCREENING     Status: Normal   Collection Time   06/28/11 12:49 AM      Component Value Range Status Comment   MRSA by PCR NEGATIVE  NEGATIVE  Final   CULTURE, BLOOD (ROUTINE X 2)     Status: Normal (Preliminary result)   Collection Time   06/28/11  1:33 AM      Component Value Range Status Comment   Specimen Description BLOOD LEFT HAND   Final    Special Requests BOTTLES DRAWN AEROBIC ONLY 5CC   Final    Setup Time [id number]   Final    Culture     Final    Value:        BLOOD CULTURE RECEIVED NO GROWTH TO DATE CULTURE WILL BE HELD FOR 5 DAYS BEFORE ISSUING A FINAL NEGATIVE REPORT   Report Status PENDING   Incomplete   CULTURE, BLOOD (ROUTINE X 2)     Status: Normal (Preliminary result)   Collection Time   06/28/11  1:34 AM      Component Value Range Status Comment   Specimen Description  BLOOD RIGHT HAND   Final    Special Requests BOTTLES DRAWN AEROBIC ONLY 5CC   Final    Setup Time [id number]   Final    Culture     Final    Value:        BLOOD CULTURE RECEIVED NO GROWTH TO DATE CULTURE WILL BE HELD FOR 5 DAYS BEFORE ISSUING A FINAL NEGATIVE REPORT   Report Status PENDING   Incomplete   CULTURE, SPUTUM-ASSESSMENT     Status: Normal   Collection Time   06/28/11  9:15 AM      Component Value Range Status Comment   Specimen Description SPUTUM   Final    Special Requests NONE   Final    Sputum evaluation     Final    Value: MICROSCOPIC FINDINGS SUGGEST THAT THIS SPECIMEN IS NOT REPRESENTATIVE OF LOWER RESPIRATORY SECRETIONS. PLEASE RECOLLECT.     CALLED TO CURRAN L RN 06/28/11 1025 COSTELLO B   Report Status 06/28/2011 FINAL   Final   CULTURE, SPUTUM-ASSESSMENT  Status: Normal   Collection Time   06/28/11  7:14 PM      Component Value Range Status Comment   Specimen Description SPUTUM   Final    Special Requests NONE   Final    Sputum evaluation     Final    Value: THIS SPECIMEN IS ACCEPTABLE. RESPIRATORY CULTURE REPORT TO FOLLOW.   Report Status 06/29/2011 FINAL   Final   CULTURE, RESPIRATORY     Status: Normal (Preliminary result)   Collection Time   06/28/11  7:14 PM      Component Value Range Status Comment   Specimen Description SPUTUM   Final    Special Requests NONE   Final    Gram Stain     Final    Value: FEW WBC PRESENT,BOTH PMN AND MONONUCLEAR     NO SQUAMOUS EPITHELIAL CELLS SEEN     NO ORGANISMS SEEN   Culture NORMAL OROPHARYNGEAL FLORA   Final    Report Status PENDING   Incomplete       Basic Metabolic Panel:  Basename 07/01/11 0600 06/30/11 0500  NA 135 138  K 4.7 3.2*  CL 101 104  CO2 23 24  GLUCOSE 204* 176*  BUN 9 12  CREATININE 0.53 0.66  CALCIUM 9.6 9.5  MG -- --  PHOS -- --   Liver Function Tests:  Basename 06/28/11 1032  AST 15  ALT 10  ALKPHOS 121*  BILITOT 0.7  PROT 7.3  ALBUMIN 3.1*     CBC:  Basename 07/01/11  0600 06/30/11 0500  WBC 7.4 10.4  NEUTROABS -- --  HGB 13.2 12.3  HCT 38.7 36.4  MCV 78.0 79.0  PLT 289 258    Ct Head Wo Contrast  06/28/2011  *RADIOLOGY REPORT*  Clinical Data: Altered mental status, confusion, history hypertension, COPD  CT HEAD WITHOUT CONTRAST  Technique:  Contiguous axial images were obtained from the base of the skull through the vertex without contrast.  Comparison: Prior exam from 2004predates PACs and is unavailable for comparison.  Findings: Motion artifacts despite repeating images. Normal ventricular morphology. No midline shift or mass effect. Within the limitation of motion, no definite intracranial hemorrhage, mass lesion or evidence of acute infarction. No extra-axial fluid collection. Small amount fluid or mucous dependently in left maxillary sinus. Partial opacification of a few right ethmoid air cells. Bones unremarkable.  IMPRESSION: Limitations of exam secondary to patient motion. No definite acute intracranial abnormalities identified.  Original Report Authenticated By: Lollie Marrow, M.D.   Dg Chest Port 1 View  06/29/2011  *RADIOLOGY REPORT*  Clinical Data: Pneumonia.  PORTABLE CHEST - 1 VIEW  Comparison: 06/28/2011  Findings: Cardiomegaly with bilateral airspace opacities, most confluent in the left lower lobe.  No real change since prior study.  No definite effusions.  No acute bony abnormality.  IMPRESSION: Bilateral airspace opacities, most confluent in the left lower lobe, unchanged.  Original Report Authenticated By: Cyndie Chime, M.D.   Dg Chest Port 1 View  06/28/2011  *RADIOLOGY REPORT*  Clinical Data: Pneumonia, cough, weakness, shortness of breath  PORTABLE CHEST - 1 VIEW  Comparison: June 27, 2011  Findings: Left lower lobe consolidated infiltrate has increased. No infiltrate is present on the right.  The cardiac silhouette, mediastinum, pulmonary vasculature are within normal limits.  IMPRESSION: Increase in size of left lower lobe  consolidated infiltrate.  Original Report Authenticated By: Brandon Melnick, M.D.      Medications:  Scheduled:    .  albuterol  2.5 mg Nebulization Q6H  . amLODipine  5 mg Oral Daily  . antiseptic oral rinse  15 mL Mouth Rinse BID  . benzonatate  200 mg Oral TID  . insulin aspart  0-15 Units Subcutaneous TID WC  . insulin aspart  0-5 Units Subcutaneous QHS  . insulin aspart  3 Units Subcutaneous TID WC  . levofloxacin  750 mg Oral Daily  . naproxen  375 mg Oral BID WC  . nicotine  14 mg Transdermal Daily  . potassium chloride  40 mEq Oral BID  . predniSONE  50 mg Oral Q breakfast  . ranitidine  150 mg Oral QHS  . DISCONTD: ipratropium  0.5 mg Nebulization Q6H  . DISCONTD: naloxone (NARCAN) injection  0.4 mg Intravenous Once    Impression: Principal Problem:  *Pneumonia Active Problems:  COPD with acute exacerbation  Hemoptysis  HTN (hypertension)  TOBACCO ABUSE  LOW BACK PAIN  Diabetes type 2, uncontrolled     Plan: Continue current abx Mobilize Add prednisone    Dc home   LOS: 4 days   Shanon Seawright, MD Pager: [phone] 07/01/2011, 9:24 AM

## 2011-07-01 NOTE — Progress Notes (Signed)
  Echocardiogram 2D Echocardiogram has been performed.  Sarah [name], Real Cons 07/01/2011, 11:45 AM

## 2011-07-01 NOTE — Progress Notes (Signed)
Tele d/c'd per MD order to d/c after 48 hours.

## 2012-04-09 ENCOUNTER — Emergency Department (HOSPITAL_COMMUNITY): Payer: PRIVATE HEALTH INSURANCE

## 2012-04-09 ENCOUNTER — Encounter (HOSPITAL_COMMUNITY): Payer: Self-pay | Admitting: Emergency Medicine

## 2012-04-09 ENCOUNTER — Emergency Department (HOSPITAL_COMMUNITY)
Admission: EM | Admit: 2012-04-09 | Discharge: 2012-04-09 | Disposition: A | Discharge status: Home / Self Care | Payer: PRIVATE HEALTH INSURANCE | Attending: Emergency Medicine | Admitting: Emergency Medicine

## 2012-04-09 DIAGNOSIS — Z79899 Other long term (current) drug therapy: Secondary | ICD-10-CM | POA: Insufficient documentation

## 2012-04-09 DIAGNOSIS — K2970 Gastritis, unspecified, without bleeding: Secondary | ICD-10-CM | POA: Insufficient documentation

## 2012-04-09 DIAGNOSIS — I10 Essential (primary) hypertension: Secondary | ICD-10-CM | POA: Insufficient documentation

## 2012-04-09 DIAGNOSIS — F17200 Nicotine dependence, unspecified, uncomplicated: Secondary | ICD-10-CM | POA: Insufficient documentation

## 2012-04-09 DIAGNOSIS — E119 Type 2 diabetes mellitus without complications: Secondary | ICD-10-CM | POA: Insufficient documentation

## 2012-04-09 LAB — COMPREHENSIVE METABOLIC PANEL
ALT: 13 U/L (ref 0–35)
AST: 14 U/L (ref 0–37)
Calcium: 9.2 mg/dL (ref 8.4–10.5)
Creatinine, Ser: 0.64 mg/dL (ref 0.50–1.10)
GFR calc non Af Amer: >90 mL/min (ref >90)
Sodium: 137 mEq/L (ref 135–145)
Total Protein: 7.5 g/dL (ref 6.0–8.3)

## 2012-04-09 LAB — CBC WITH DIFFERENTIAL/PLATELET
Eosinophils Absolute: 0.1 10*3/uL (ref 0.0–0.7)
Eosinophils Relative: 1 % (ref 0–5)
Hemoglobin: 14.8 g/dL (ref 12.0–15.0)
Lymphocytes Relative: 32 % (ref 12–46)
Lymphs Abs: 2.1 10*3/uL (ref 0.7–4.0)
MCH: 27 pg (ref 26.0–34.0)
MCV: 77.4 fL — ABNORMAL LOW (ref 78.0–100.0)
Monocytes Relative: 4 % (ref 3–12)
Neutrophils Relative %: 63 % (ref 43–77)
Platelets: 245 10*3/uL (ref 150–400)
RBC: 5.48 MIL/uL — ABNORMAL HIGH (ref 3.87–5.11)
WBC: 6.4 10*3/uL (ref 4.0–10.5)

## 2012-04-09 MED ORDER — ONDANSETRON HCL 4 MG PO TABS: 4.0000 mg | ORAL_TABLET | Freq: Four times a day (QID) | ORAL | Status: DC

## 2012-04-09 MED ORDER — LANSOPRAZOLE 30 MG PO CPDR: 30.0000 mg | DELAYED_RELEASE_CAPSULE | Freq: Every day | ORAL | Status: DC

## 2012-04-09 MED ORDER — ONDANSETRON 4 MG PO TBDP
4.0000 mg | ORAL_TABLET | Freq: Once | ORAL | Status: AC
  Administered 2012-04-09: 4 mg via ORAL
  Filled 2012-04-09: qty 1

## 2012-04-09 MED ORDER — GI COCKTAIL ~~LOC~~
30.0000 mL | Freq: Once | ORAL | Status: AC
  Administered 2012-04-09: 30 mL via ORAL
  Filled 2012-04-09: qty 30

## 2012-04-09 NOTE — ED Notes (Signed)
Pt states that she has had upper abd pain radiating into her right chest x 1 hour.  Denies NVD.  Pain 9/10.  EKG done.  Denies cardiac history.

## 2012-04-09 NOTE — Discharge Instructions (Signed)
Gastritis, Adult Gastritis is soreness and swelling (inflammation) of the lining of the stomach. Gastritis can develop as a sudden onset (acute) or long-term (chronic) condition. If gastritis is not treated, it can lead to stomach bleeding and ulcers. CAUSES  Gastritis occurs when the stomach lining is weak or damaged. Digestive juices from the stomach then inflame the weakened stomach lining. The stomach lining may be weak or damaged due to viral or bacterial infections. One common bacterial infection is the Helicobacter pylori infection. Gastritis can also result from excessive alcohol consumption, taking certain medicines, or having too much acid in the stomach.  SYMPTOMS  In some cases, there are no symptoms. When symptoms are present, they may include:  Pain or a burning sensation in the upper abdomen.  Nausea.  Vomiting.  An uncomfortable feeling of fullness after eating. DIAGNOSIS  Your caregiver may suspect you have gastritis based on your symptoms and a physical exam. To determine the cause of your gastritis, your caregiver may perform the following:  Blood or stool tests to check for the H pylori bacterium.  Gastroscopy. A thin, flexible tube (endoscope) is passed down the esophagus and into the stomach. The endoscope has a light and camera on the end. Your caregiver uses the endoscope to view the inside of the stomach.  Taking a tissue sample (biopsy) from the stomach to examine under a microscope. TREATMENT  Depending on the cause of your gastritis, medicines may be prescribed. If you have a bacterial infection, such as an H pylori infection, antibiotics may be given. If your gastritis is caused by too much acid in the stomach, H2 blockers or antacids may be given. Your caregiver may recommend that you stop taking aspirin, ibuprofen, or other nonsteroidal anti-inflammatory drugs (NSAIDs). HOME CARE INSTRUCTIONS  Only take over-the-counter or prescription medicines as directed by  your caregiver.  If you were given antibiotic medicines, take them as directed. Finish them even if you start to feel better.  Drink enough fluids to keep your urine clear or pale yellow.  Avoid foods and drinks that make your symptoms worse, such as:  Caffeine or alcoholic drinks.  Chocolate.  Peppermint or mint flavorings.  Garlic and onions.  Spicy foods.  Citrus fruits, such as oranges, lemons, or limes.  Tomato-based foods such as sauce, chili, salsa, and pizza.  Fried and fatty foods.  Eat small, frequent meals instead of large meals. SEEK IMMEDIATE MEDICAL CARE IF:   You have black or dark red stools.  You vomit blood or material that looks like coffee grounds.  You are unable to keep fluids down.  Your abdominal pain gets worse.  You have a fever.  You do not feel better after 1 week.  You have any other questions or concerns. MAKE SURE YOU:  Understand these instructions.  Will watch your condition.  Will get help right away if you are not doing well or get worse. Document Released: 05/29/2001 Document Revised: 12/04/2011 Document Reviewed: 07/18/2011 [employer] Patient Information 2013 [employer].  

## 2012-04-09 NOTE — ED Provider Notes (Signed)
History     CSN: [id number]  Arrival date & time 04/09/12  1307   First MD Initiated Contact with Patient 04/09/12 1409      Chief Complaint  Patient presents with  . Abdominal Pain    (Consider location/radiation/quality/duration/timing/severity/associated sxs/prior treatment) HPI Pt states that she developed sharp epigastric pain with nausea several hours ago. She then stopped at [facility] and ate lunch with increasing abd pain. Pt states the pain radiates up to R breast. Pt denies fever chills, diarrhea or sick contacts. No SOB, cough Past Medical History  Diagnosis Date  . Hypertension   . Diabetes type 2, uncontrolled     History reviewed. No pertinent past surgical history.  History reviewed. No pertinent family history.  History  Substance Use Topics  . Smoking status: Current Every Day Smoker -- 1.5 packs/day for 40 years    Types: Cigarettes  . Smokeless tobacco: Not on file  . Alcohol Use: No    OB History    Grav Para Term Preterm Abortions TAB SAB Ect Mult Living                  Review of Systems  Constitutional: Negative for fever and chills.  Respiratory: Negative for cough, choking and shortness of breath.   Cardiovascular: Negative for chest pain, palpitations and leg swelling.  Gastrointestinal: Positive for nausea and abdominal pain. Negative for vomiting, diarrhea, constipation and blood in stool.  Genitourinary: Negative for dysuria.  Musculoskeletal: Negative for back pain.  Skin: Negative for rash and wound.  Neurological: Negative for dizziness, weakness, light-headedness, numbness and headaches.    Allergies  Ibuprofen and Penicillins  Home Medications   Current Outpatient Rx  Name Route Sig Dispense Refill  . ALBUTEROL SULFATE (5 MG/ML) 0.5% IN NEBU Nebulization Take 0.5 mLs (2.5 mg total) by nebulization every 4 (four) hours as needed for wheezing. 60 vial 0  . AMLODIPINE BESYLATE 5 MG PO TABS Oral Take 5 mg by mouth daily.      [name] HYDROCHLOROTHIAZIDE 12.5 MG PO CAPS Oral Take 12.5 mg by mouth daily.    [name] HYDROCODONE-ACETAMINOPHEN 5-500 MG PO TABS Oral Take 1 tablet by mouth every 6 (six) hours as needed.    [name] NAPROXEN 500 MG PO TABS Oral Take 500 mg by mouth 2 (two) times daily with a meal.    . TRAMADOL HCL 50 MG PO TABS Oral Take 50 mg by mouth every 6 (six) hours as needed.    [name] LANSOPRAZOLE 30 MG PO CPDR Oral Take 1 capsule (30 mg total) by mouth daily. 30 capsule 0  . ONDANSETRON HCL 4 MG PO TABS Oral Take 1 tablet (4 mg total) by mouth every 6 (six) hours. 12 tablet 0    BP 183/82  Pulse 88  Temp 98.6 F (37 C) (Oral)  Resp 29  SpO2 98%  Physical Exam  Nursing note and vitals reviewed. Constitutional: She is oriented to person, place, and time. She appears well-developed and well-nourished. No distress.  HENT:  Head: Normocephalic and atraumatic.  Mouth/Throat: Oropharynx is clear and moist.  Eyes: EOM are normal. Pupils are equal, round, and reactive to light.  Neck: Normal range of motion. Neck supple.  Cardiovascular: Normal rate and regular rhythm.   Pulmonary/Chest: Effort normal and breath sounds normal. No respiratory distress. She has no wheezes. She has no rales. She exhibits no tenderness.  Abdominal: Soft. Bowel sounds are normal. She exhibits no distension and no mass. There is tenderness (  epigastric TTP. No rebound or guarding. ). There is no rebound and no guarding.  Musculoskeletal: Normal range of motion. She exhibits no edema and no tenderness.  Neurological: She is alert and oriented to person, place, and time.  Skin: Skin is warm and dry. No rash noted. No erythema.  Psychiatric: She has a normal mood and affect. Her behavior is normal.    ED Course  Procedures (including critical care time)  Labs Reviewed  COMPREHENSIVE METABOLIC PANEL - Abnormal; Notable for the following:    Potassium 3.3 (*)     Glucose, Bld 328 (*)     Alkaline Phosphatase 172 (*)     All other  components within normal limits  CBC WITH DIFFERENTIAL - Abnormal; Notable for the following:    RBC 5.48 (*)     MCV 77.4 (*)     All other components within normal limits  LIPASE, BLOOD   Dg Abd Acute W/chest  04/09/2012  *RADIOLOGY REPORT*  Clinical Data: Abdominal pain  ACUTE ABDOMEN SERIES (ABDOMEN 2 VIEW & CHEST 1 VIEW)  Comparison: 06/29/2011  Findings: Prominent heart size.  Pulmonary vascular congestion may reflect mild fluid overload.  No edema.  Left lower lobe atelectasis is present.  No significant pleural effusion.  Normal bowel gas pattern.  Nondilated colon with stool.  No dilated small bowel loops.  No free air in the abdomen.  No acute bony abnormality.  IMPRESSION: Cardiac enlargement with mild vascular congestion.  Normal bowel gas pattern.   Original Report Authenticated By: Camelia Phenes, M.D.      1. Gastritis      Date: 04/09/2012  Rate: 93  Rhythm: normal sinus rhythm  QRS Axis: normal  Intervals: normal  ST/T Wave abnormalities: normal  Conduction Disutrbances:none  Narrative Interpretation:   Old EKG Reviewed: none available    MDM  On further questioning, pt states she has had similar pain in the past. Denies taking recent NSAID's        Loren Racer, MD 04/09/12 1551

## 2012-07-01 ENCOUNTER — Emergency Department (HOSPITAL_COMMUNITY)
Admission: EM | Admit: 2012-07-01 | Discharge: 2012-07-01 | Disposition: A | Discharge status: Home / Self Care | Payer: PRIVATE HEALTH INSURANCE | Attending: Emergency Medicine | Admitting: Emergency Medicine

## 2012-07-01 ENCOUNTER — Encounter (HOSPITAL_COMMUNITY): Payer: Self-pay | Admitting: *Deleted

## 2012-07-01 ENCOUNTER — Emergency Department (HOSPITAL_COMMUNITY): Payer: PRIVATE HEALTH INSURANCE

## 2012-07-01 DIAGNOSIS — F17200 Nicotine dependence, unspecified, uncomplicated: Secondary | ICD-10-CM | POA: Insufficient documentation

## 2012-07-01 DIAGNOSIS — R05 Cough: Secondary | ICD-10-CM | POA: Insufficient documentation

## 2012-07-01 DIAGNOSIS — R52 Pain, unspecified: Secondary | ICD-10-CM | POA: Insufficient documentation

## 2012-07-01 DIAGNOSIS — R059 Cough, unspecified: Secondary | ICD-10-CM | POA: Insufficient documentation

## 2012-07-01 DIAGNOSIS — E119 Type 2 diabetes mellitus without complications: Secondary | ICD-10-CM | POA: Insufficient documentation

## 2012-07-01 DIAGNOSIS — R0602 Shortness of breath: Secondary | ICD-10-CM | POA: Insufficient documentation

## 2012-07-01 DIAGNOSIS — Z79899 Other long term (current) drug therapy: Secondary | ICD-10-CM | POA: Insufficient documentation

## 2012-07-01 DIAGNOSIS — I10 Essential (primary) hypertension: Secondary | ICD-10-CM | POA: Insufficient documentation

## 2012-07-01 DIAGNOSIS — N760 Acute vaginitis: Secondary | ICD-10-CM

## 2012-07-01 DIAGNOSIS — B9689 Other specified bacterial agents as the cause of diseases classified elsewhere: Secondary | ICD-10-CM

## 2012-07-01 DIAGNOSIS — J45909 Unspecified asthma, uncomplicated: Secondary | ICD-10-CM | POA: Insufficient documentation

## 2012-07-01 DIAGNOSIS — R112 Nausea with vomiting, unspecified: Secondary | ICD-10-CM | POA: Insufficient documentation

## 2012-07-01 DIAGNOSIS — R6889 Other general symptoms and signs: Secondary | ICD-10-CM

## 2012-07-01 HISTORY — DX: Unspecified asthma, uncomplicated: J45.909

## 2012-07-01 LAB — BASIC METABOLIC PANEL
BUN: 5 mg/dL — ABNORMAL LOW (ref 6–23)
CO2: 26 mEq/L (ref 19–32)
Calcium: 9.6 mg/dL (ref 8.4–10.5)
Creatinine, Ser: 0.64 mg/dL (ref 0.50–1.10)
GFR calc non Af Amer: >90 mL/min (ref >90)
Glucose, Bld: 218 mg/dL — ABNORMAL HIGH (ref 70–99)

## 2012-07-01 LAB — CBC WITH DIFFERENTIAL/PLATELET
Eosinophils Absolute: 0 10*3/uL (ref 0.0–0.7)
Eosinophils Relative: 0 % (ref 0–5)
HCT: 42 % (ref 36.0–46.0)
Lymphocytes Relative: 16 % (ref 12–46)
Lymphs Abs: 1 10*3/uL (ref 0.7–4.0)
MCH: 26.5 pg (ref 26.0–34.0)
MCV: 76.4 fL — ABNORMAL LOW (ref 78.0–100.0)
Monocytes Absolute: 0.5 10*3/uL (ref 0.1–1.0)
RBC: 5.5 MIL/uL — ABNORMAL HIGH (ref 3.87–5.11)
WBC: 6.3 10*3/uL (ref 4.0–10.5)

## 2012-07-01 LAB — POCT I-STAT TROPONIN I: Troponin i, poc: 0 ng/mL (ref 0.00–0.08)

## 2012-07-01 LAB — WET PREP, GENITAL: Trich, Wet Prep: NONE SEEN

## 2012-07-01 MED ORDER — SODIUM CHLORIDE 0.9 % IV BOLUS (SEPSIS)
1000.0000 mL | INTRAVENOUS | Status: AC
  Administered 2012-07-01: 1000 mL via INTRAVENOUS

## 2012-07-01 MED ORDER — OSELTAMIVIR PHOSPHATE 75 MG PO CAPS: 75.0000 mg | ORAL_CAPSULE | Freq: Two times a day (BID) | ORAL | Status: DC

## 2012-07-01 MED ORDER — ONDANSETRON HCL 4 MG/2ML IJ SOLN
4.0000 mg | Freq: Once | INTRAMUSCULAR | Status: AC
  Administered 2012-07-01: 4 mg via INTRAVENOUS
  Filled 2012-07-01: qty 2

## 2012-07-01 MED ORDER — ALBUTEROL SULFATE (5 MG/ML) 0.5% IN NEBU
2.5000 mg | INHALATION_SOLUTION | Freq: Once | RESPIRATORY_TRACT | Status: AC
  Administered 2012-07-01: 2.5 mg via RESPIRATORY_TRACT
  Filled 2012-07-01: qty 0.5

## 2012-07-01 MED ORDER — METRONIDAZOLE 500 MG PO TABS: 500.0000 mg | ORAL_TABLET | Freq: Two times a day (BID) | ORAL | Status: DC

## 2012-07-01 MED ORDER — HYDROCODONE-ACETAMINOPHEN 5-325 MG PO TABS
2.0000 | ORAL_TABLET | Freq: Once | ORAL | Status: AC
  Administered 2012-07-01: 2 via ORAL
  Filled 2012-07-01: qty 2

## 2012-07-01 MED ORDER — ACETAMINOPHEN 325 MG PO TABS
650.0000 mg | ORAL_TABLET | Freq: Once | ORAL | Status: DC
  Filled 2012-07-01: qty 2

## 2012-07-01 NOTE — ED Provider Notes (Signed)
History     CSN: [id number]  Arrival date & time 07/01/12  1202   First MD Initiated Contact with Patient 07/01/12 1343      Chief Complaint  Patient presents with  . Multiple Complaints     (Consider location/radiation/quality/duration/timing/severity/associated sxs/prior treatment) HPI Comments: This is a 57 year old female, with a PMH of HTN, asthma, and uncontrolled diabetes, who presents to the emergency department with chief complaint of headache, cough, and shortness of breath. Patient additionally states that she has generalized body aches, nausea, and vomiting. She states that her symptoms began on Friday, and have progressively worsened. She has not taken anything to alleviate her symptoms.  She states that her symptoms are severe.  The history is provided by the patient. No language interpreter was used.    Past Medical History  Diagnosis Date  . Hypertension   . Diabetes type 2, uncontrolled   . Asthma     Past Surgical History  Procedure Date  . Abdominal hysterectomy     History reviewed. No pertinent family history.  History  Substance Use Topics  . Smoking status: Current Every Day Smoker -- 1.5 packs/day for 40 years    Types: Cigarettes  . Smokeless tobacco: Never Used  . Alcohol Use: No    OB History    Grav Para Term Preterm Abortions TAB SAB Ect Mult Living                  Review of Systems  All other systems reviewed and are negative.    Allergies  Ibuprofen and Penicillins  Home Medications   Current Outpatient Rx  Name  Route  Sig  Dispense  Refill  . AMLODIPINE BESYLATE 5 MG PO TABS   Oral   Take 10 mg by mouth daily.          [name] FLUOROMETHOLONE 0.25 % OP SUSP   Both Eyes   Place 1 drop into both eyes 4 (four) times daily.         [name] HYDROCHLOROTHIAZIDE 50 MG PO TABS   Oral   Take 50 mg by mouth daily.         [name] HYDROCODONE-ACETAMINOPHEN 5-500 MG PO TABS   Oral   Take 1 tablet by mouth every 6 (six) hours as  needed.         [name] LANSOPRAZOLE 30 MG PO CPDR   Oral   Take 1 capsule (30 mg total) by mouth daily.   30 capsule   0   . LOTEPREDNOL-TOBRAMYCIN 0.5-0.3 % OP SUSP   Both Eyes   Place 2 drops into both eyes every 4 (four) hours. x10 days         . NAPROXEN 500 MG PO TABS   Oral   Take 500 mg by mouth 2 (two) times daily with a meal.         . ONDANSETRON HCL 4 MG PO TABS   Oral   Take 1 tablet (4 mg total) by mouth every 6 (six) hours.   12 tablet   0   . TRAMADOL HCL 50 MG PO TABS   Oral   Take 50 mg by mouth every 6 (six) hours as needed.           BP 121/57  Pulse 95  Temp 98.2 F (36.8 C) (Oral)  Resp 18  SpO2 95%  Physical Exam  Nursing note and vitals reviewed. Constitutional: She is oriented to person, place, and time. She appears well-developed  and well-nourished.  HENT:  Head: Normocephalic and atraumatic.  Eyes: Conjunctivae normal and EOM are normal. Pupils are equal, round, and reactive to light.  Neck: Normal range of motion. Neck supple.  Cardiovascular: Normal rate and regular rhythm.  Exam reveals no gallop and no friction rub.   No murmur heard. Pulmonary/Chest: Effort normal and breath sounds normal. No respiratory distress. She has no wheezes. She has no rales. She exhibits no tenderness.  Abdominal: Soft. Bowel sounds are normal. She exhibits no distension and no mass. There is no tenderness. There is no rebound and no guarding.  Genitourinary: No labial fusion. There is no rash, tenderness, lesion or injury on the right labia. There is no rash, tenderness, lesion or injury on the left labia. No erythema, tenderness or bleeding around the vagina. No foreign body around the vagina. No signs of injury around the vagina. No vaginal discharge found.  Musculoskeletal: Normal range of motion. She exhibits no edema and no tenderness.  Neurological: She is alert and oriented to person, place, and time.  Skin: Skin is warm and dry.  Psychiatric: She  has a normal mood and affect. Her behavior is normal. Judgment and thought content normal.    ED Course  Procedures (including critical care time)  Results for orders placed during the hospital encounter of 07/01/12  CBC WITH DIFFERENTIAL      Component Value Range   WBC 6.3  4.0 - 10.5 K/uL   RBC 5.50 (*) 3.87 - 5.11 MIL/uL   Hemoglobin 14.6  12.0 - 15.0 g/dL   HCT 16.1  09.6 - 04.5 %   MCV 76.4 (*) 78.0 - 100.0 fL   MCH 26.5  26.0 - 34.0 pg   MCHC 34.8  30.0 - 36.0 g/dL   RDW 40.9  81.1 - 91.4 %   Platelets 225  150 - 400 K/uL   Neutrophils Relative 77  43 - 77 %   Neutro Abs 4.8  1.7 - 7.7 K/uL   Lymphocytes Relative 16  12 - 46 %   Lymphs Abs 1.0  0.7 - 4.0 K/uL   Monocytes Relative 7  3 - 12 %   Monocytes Absolute 0.5  0.1 - 1.0 K/uL   Eosinophils Relative 0  0 - 5 %   Eosinophils Absolute 0.0  0.0 - 0.7 K/uL   Basophils Relative 0  0 - 1 %   Basophils Absolute 0.0  0.0 - 0.1 K/uL  BASIC METABOLIC PANEL      Component Value Range   Sodium 130 (*) 135 - 145 mEq/L   Potassium 3.4 (*) 3.5 - 5.1 mEq/L   Chloride 91 (*) 96 - 112 mEq/L   CO2 26  19 - 32 mEq/L   Glucose, Bld 218 (*) 70 - 99 mg/dL   BUN 5 (*) 6 - 23 mg/dL   Creatinine, Ser 7.82  0.50 - 1.10 mg/dL   Calcium 9.6  8.4 - 95.6 mg/dL   GFR calc non Af Amer >90  >90 mL/min   GFR calc Af Amer >90  >90 mL/min  WET PREP, GENITAL      Component Value Range   Yeast Wet Prep HPF POC NONE SEEN  NONE SEEN   Trich, Wet Prep NONE SEEN  NONE SEEN   Clue Cells Wet Prep HPF POC RARE (*) NONE SEEN   WBC, Wet Prep HPF POC FEW (*) NONE SEEN  POCT I-STAT TROPONIN I      Component Value Range  Troponin i, poc 0.00  0.00 - 0.08 ng/mL   Comment 3            Dg Chest 2 View  07/01/2012  *RADIOLOGY REPORT*  Clinical Data: Chest pain  CHEST - 2 VIEW  Comparison: 04/09/2012  Findings: Increased interstitial markings/bronchitic changes. Linear scarring in the lingula.  No focal consolidation.  No pleural effusion or pneumothorax.   The heart is normal in size.  Visualized osseous structures are within normal limits.  IMPRESSION: No evidence of acute cardiopulmonary disease.   Original Report Authenticated By: Charline Bills, M.D.       1. Flu-like symptoms   2. BV (bacterial vaginosis)       MDM  56 year old female with flu-like illness.  I have discussed this patient with Dr. Blinda Leatherwood, and have reviewed the labs with him.  The patient is stable and can be discharged to home with primary care follow-up.  The patient has been treated with fluids, zofran, a nebulizer treatment, and a vicodin while in the ED.  She states that she is feeling better.  The patient is stable and ready for discharge.        Roxy Horseman, PA-C 07/01/12 1722  Roxy Horseman, PA-C 07/01/12 1723

## 2012-07-01 NOTE — ED Notes (Addendum)
Pt from home with reports of generalized body aches, cough, headache and N/V/D that has worsened since Friday. Pt also reports generalized weakness.  Pt also c/o vaginal itching.

## 2012-07-02 NOTE — ED Provider Notes (Signed)
Medical screening examination/treatment/procedure(s) were performed by non-physician practitioner and as supervising physician I was immediately available for consultation/collaboration.  Gilda Crease, MD 07/02/12 [phone]

## 2012-12-24 ENCOUNTER — Other Ambulatory Visit: Payer: Self-pay

## 2012-12-24 ENCOUNTER — Encounter (HOSPITAL_COMMUNITY): Payer: Self-pay | Admitting: Emergency Medicine

## 2012-12-24 ENCOUNTER — Emergency Department (HOSPITAL_COMMUNITY): Payer: Medicare Other

## 2012-12-24 ENCOUNTER — Emergency Department (HOSPITAL_COMMUNITY)
Admission: EM | Admit: 2012-12-24 | Discharge: 2012-12-25 | Disposition: A | Discharge status: Home / Self Care | Payer: Medicare Other | Attending: Emergency Medicine | Admitting: Emergency Medicine

## 2012-12-24 DIAGNOSIS — Z8619 Personal history of other infectious and parasitic diseases: Secondary | ICD-10-CM | POA: Insufficient documentation

## 2012-12-24 DIAGNOSIS — R05 Cough: Secondary | ICD-10-CM | POA: Insufficient documentation

## 2012-12-24 DIAGNOSIS — J189 Pneumonia, unspecified organism: Secondary | ICD-10-CM | POA: Diagnosis present

## 2012-12-24 DIAGNOSIS — K219 Gastro-esophageal reflux disease without esophagitis: Secondary | ICD-10-CM | POA: Insufficient documentation

## 2012-12-24 DIAGNOSIS — Z79899 Other long term (current) drug therapy: Secondary | ICD-10-CM | POA: Insufficient documentation

## 2012-12-24 DIAGNOSIS — J45901 Unspecified asthma with (acute) exacerbation: Secondary | ICD-10-CM | POA: Insufficient documentation

## 2012-12-24 DIAGNOSIS — R059 Cough, unspecified: Secondary | ICD-10-CM | POA: Insufficient documentation

## 2012-12-24 DIAGNOSIS — Z8742 Personal history of other diseases of the female genital tract: Secondary | ICD-10-CM | POA: Insufficient documentation

## 2012-12-24 DIAGNOSIS — Z880 Allergy status to penicillin: Secondary | ICD-10-CM | POA: Insufficient documentation

## 2012-12-24 DIAGNOSIS — Z8719 Personal history of other diseases of the digestive system: Secondary | ICD-10-CM | POA: Insufficient documentation

## 2012-12-24 DIAGNOSIS — I10 Essential (primary) hypertension: Secondary | ICD-10-CM | POA: Insufficient documentation

## 2012-12-24 DIAGNOSIS — Z791 Long term (current) use of non-steroidal anti-inflammatories (NSAID): Secondary | ICD-10-CM | POA: Insufficient documentation

## 2012-12-24 DIAGNOSIS — J159 Unspecified bacterial pneumonia: Secondary | ICD-10-CM | POA: Insufficient documentation

## 2012-12-24 DIAGNOSIS — F17200 Nicotine dependence, unspecified, uncomplicated: Secondary | ICD-10-CM | POA: Insufficient documentation

## 2012-12-24 DIAGNOSIS — IMO0001 Reserved for inherently not codable concepts without codable children: Secondary | ICD-10-CM | POA: Insufficient documentation

## 2012-12-24 HISTORY — DX: Gastritis, unspecified, without bleeding: K29.70

## 2012-12-24 HISTORY — DX: Gastro-esophageal reflux disease without esophagitis: K21.9

## 2012-12-24 LAB — BASIC METABOLIC PANEL
BUN: 10 mg/dL (ref 6–23)
Calcium: 9.3 mg/dL (ref 8.4–10.5)
Creatinine, Ser: 0.58 mg/dL (ref 0.50–1.10)
GFR calc Af Amer: >90 mL/min (ref >90)
GFR calc non Af Amer: >90 mL/min (ref >90)

## 2012-12-24 LAB — CBC
HCT: 40.4 % (ref 36.0–46.0)
MCHC: 35.1 g/dL (ref 30.0–36.0)
MCV: 78.8 fL (ref 78.0–100.0)
Platelets: 202 10*3/uL (ref 150–400)
RDW: 14.9 % (ref 11.5–15.5)

## 2012-12-24 MED ORDER — IPRATROPIUM BROMIDE 0.02 % IN SOLN
0.5000 mg | RESPIRATORY_TRACT | Status: DC
  Administered 2012-12-24: 0.5 mg via RESPIRATORY_TRACT
  Filled 2012-12-24: qty 2.5

## 2012-12-24 MED ORDER — ALBUTEROL SULFATE (5 MG/ML) 0.5% IN NEBU
5.0000 mg | INHALATION_SOLUTION | RESPIRATORY_TRACT | Status: DC
  Administered 2012-12-24: 5 mg via RESPIRATORY_TRACT
  Filled 2012-12-24: qty 1

## 2012-12-24 MED ORDER — ALBUTEROL SULFATE (5 MG/ML) 0.5% IN NEBU: 5.0000 mg | INHALATION_SOLUTION | RESPIRATORY_TRACT | Status: DC

## 2012-12-24 MED ORDER — IPRATROPIUM BROMIDE 0.02 % IN SOLN: 0.5000 mg | RESPIRATORY_TRACT | Status: DC

## 2012-12-24 NOTE — ED Notes (Signed)
PT. ARRIVED WITH EMS FROM HOME REPORTS MID CHEST PAIN WITH SOB ONSET THIS EVENING , TOOK 1 NTG SL WITH NO RELIEF , DENIES NAUSEA OR DIAPHORESIS . PAIN NON RADIATING / MID CHEST TENDER TO PALPATE .

## 2012-12-24 NOTE — ED Provider Notes (Signed)
History    CSN: [id number] Arrival date & time 12/24/12  2055  None    Chief Complaint  Patient presents with  . Chest Pain   (Consider location/radiation/quality/duration/timing/severity/associated sxs/prior Treatment) HPI Sarah [name] 57 y.o. with a history of hypertension diabetes, asthma, multiple episodes of pneumonia presents emergency department for chest pain. Pain started approximately 2 days ago it has been worsening. It is located to the left side of her chest in multiple areas. She reports that it started with exertion yesterday, and improved with rest but then continued spontaneous while she was at rest. She notes it is also positional. The pain is worsened with "pressing on my chest", and also with twisting, turning, or bending. She says the pain is also worsened with deep inspiration. Her pain is 9/10, nonradiating, exacerbating and relieving factors described above. It's severe in severity. Associated symptoms include shortness of breath, productive cough. Shortness of breath hasn't been worsening over one day. She has had minimal to no relief with albuterol nebulizer treatments at home. Productive cough has been worsening over the last several days. Cough is productive of green sputum. She denies fevers chills or sweats. Past Medical History  Diagnosis Date  . Hypertension   . Diabetes type 2, uncontrolled   . Asthma   . Gastritis   . Pneumonia   . Bacterial vaginosis   . GERD (gastroesophageal reflux disease)    Past Surgical History  Procedure Laterality Date  . Abdominal hysterectomy     No family history on file. History  Substance Use Topics  . Smoking status: Current Every Day Smoker -- 1.50 packs/day for 40 years    Types: Cigarettes  . Smokeless tobacco: Never Used  . Alcohol Use: No   OB History   Grav Para Term Preterm Abortions TAB SAB Ect Mult Living                 Review of Systems  Constitutional: Negative for fever, chills, diaphoresis,  activity change and appetite change.  HENT: Negative for sore throat, rhinorrhea, sneezing, drooling and trouble swallowing.   Eyes: Negative for discharge and redness.  Respiratory: Positive for cough, chest tightness and shortness of breath. Negative for wheezing and stridor.   Cardiovascular: Positive for chest pain. Negative for leg swelling.  Gastrointestinal: Negative for nausea, vomiting, abdominal pain, diarrhea, constipation and blood in stool.  Genitourinary: Negative for difficulty urinating.  Skin: Negative for pallor.  Neurological: Negative for dizziness, syncope, speech difficulty, weakness, light-headedness and headaches.  Hematological: Negative for adenopathy. Does not bruise/bleed easily.  Psychiatric/Behavioral: Negative for confusion and agitation.    Allergies  Ibuprofen and Penicillins  Home Medications   Current Outpatient Rx  Name  Route  Sig  Dispense  Refill  . amLODipine (NORVASC) 5 MG tablet   Oral   Take 10 mg by mouth daily.          . hydrochlorothiazide (HYDRODIURIL) 50 MG tablet   Oral   Take 50 mg by mouth daily.         . lansoprazole (PREVACID) 30 MG capsule   Oral   Take 1 capsule (30 mg total) by mouth daily.   30 capsule   0   . naproxen (NAPROSYN) 500 MG tablet   Oral   Take 500 mg by mouth 2 (two) times daily with a meal.          BP 155/63  Pulse 89  Temp(Src) 98.2 F (36.8 C) (Oral)  Resp  18  SpO2 97% Physical Exam  Constitutional: She is oriented to person, place, and time. She appears well-developed and well-nourished. No distress.  HENT:  Head: Normocephalic and atraumatic.  Right Ear: External ear normal.  Left Ear: External ear normal.  Eyes: Conjunctivae and EOM are normal. Right eye exhibits no discharge. Left eye exhibits no discharge.  Neck: Normal range of motion. Neck supple. No JVD present.  Cardiovascular: Normal rate, regular rhythm and normal heart sounds.  Exam reveals no gallop and no friction rub.    No murmur heard. Pulmonary/Chest: Effort normal. No stridor. No respiratory distress. She has wheezes (Expiratory diffusely bilaterally\). She has rales (Bilateral lung bases). She exhibits tenderness (Left-sided, and generalized left-sided.).  Abdominal: Soft. Bowel sounds are normal. She exhibits no distension. There is no tenderness. There is no rebound and no guarding.  Musculoskeletal: Normal range of motion. She exhibits no edema.  Neurological: She is alert and oriented to person, place, and time.  Skin: Skin is warm. No rash noted. She is not diaphoretic.  Psychiatric: She has a normal mood and affect. Her behavior is normal.    ED Course  Procedures (including critical care time) Labs Reviewed  CBC - Abnormal; Notable for the following:    RBC 5.13 (*)    All other components within normal limits  BASIC METABOLIC PANEL - Abnormal; Notable for the following:    Potassium 3.2 (*)    Glucose, Bld 277 (*)    All other components within normal limits  PRO B NATRIURETIC PEPTIDE  POCT I-STAT TROPONIN I   Dg Chest 2 View  12/24/2012   *RADIOLOGY REPORT*  Clinical Data: Chest pain extending into left arm, shortness of breath, cough, smoker, history asthma, hypertension, diabetes  CHEST - 2 VIEW  Comparison: 07/01/2012  Findings: Enlargement of cardiac silhouette with pulmonary vascular congestion. Chronic atelectasis at lingula. Hazy infiltrates are identified at the mid-to-lower lungs bilaterally question edema versus infection. No gross pleural effusion or pneumothorax. Bones appear demineralized.  IMPRESSION: Enlargement of cardiac silhouette with pulmonary vascular congestion. Hazy infiltrates mid-to-lower lungs question mild edema versus infection. Minimal lingular atelectasis.   Original Report Authenticated By: Ulyses Southward, M.D.   No diagnosis found. Results for orders placed during the hospital encounter of 12/24/12  CBC      Result Value Range   WBC 6.1  4.0 - 10.5 K/uL   RBC  5.13 (*) 3.87 - 5.11 MIL/uL   Hemoglobin 14.2  12.0 - 15.0 g/dL   HCT 16.1  09.6 - 04.5 %   MCV 78.8  78.0 - 100.0 fL   MCH 27.7  26.0 - 34.0 pg   MCHC 35.1  30.0 - 36.0 g/dL   RDW 40.9  81.1 - 91.4 %   Platelets 202  150 - 400 K/uL  BASIC METABOLIC PANEL      Result Value Range   Sodium 136  135 - 145 mEq/L   Potassium 3.2 (*) 3.5 - 5.1 mEq/L   Chloride 101  96 - 112 mEq/L   CO2 24  19 - 32 mEq/L   Glucose, Bld 277 (*) 70 - 99 mg/dL   BUN 10  6 - 23 mg/dL   Creatinine, Ser 7.82  0.50 - 1.10 mg/dL   Calcium 9.3  8.4 - 95.6 mg/dL   GFR calc non Af Amer >90  >90 mL/min   GFR calc Af Amer >90  >90 mL/min  POCT I-STAT TROPONIN I      Result Value  Range   Troponin i, poc 0.00  0.00 - 0.08 ng/mL   Comment 3             Date: 12/25/2012  Rate: 88  Rhythm: normal sinus rhythm  QRS Axis: normal  Intervals: normal  ST/T Wave abnormalities: normal  Conduction Disutrbances:none  Narrative Interpretation: NSR  Old EKG Reviewed: unchanged   MDM  Shavonte J Knoch 57 y.o. with a history of poorly controlled diabetes, asthma, multiple episodes of pneumonia, hypertension presents emergency department for worsening chest pain short of breath. Pain is positional reproducible. She is afebrile vital signs are stable. Positional pain and refusal pain on exam. Diffuse wheezing and rhonchi sounds bilaterally. EKG shows no evidence of ischemia. His troponins negative. Patient likely just suffering from pneumonia. No recent hospitalizations. No hypoxia. No respiratory distress. Patient ambulatory without  Difficulty. Minimal 4 extremities voluntarily and freely. We'll treat for the acquired pneumonia. Patient was adamant that she did not want to be admitted. This was discussed with the on-call hospitalist who reviewed the case and agreed admission not necessary. Using shared decision making this is reasonable considering the patient said she would be able to followup in the clinic in the next one to 2 days.  Patient allergic to penicillins. She was given a dose of Levaquin in the emergency department and given a prescription for 6 more days. She is given strong return precautions including worsening shortness of breath, fevers, or any other alarming or concerning symptoms or issues. I discussed this patient's care at my attending, Dr. Jeraldine Loots.  Sena Hitch, MD 12/25/12 0157

## 2012-12-24 NOTE — ED Notes (Signed)
After arrival from EMS pt at triage to be triaged (she had an IV in her Left AC)  I got vitals and EKG, pt complaining that she isn't being taken right back to a room, I told her that we didn't have a room available at this time.  "she also stated that she is being treated like a dog" I told pt that I would need to get her blood work , Pt stated that I would "need to draw off her IV because she has rolling veins", I told her that I was not allowed to do that, she said "well you not sticking me again" I said to her can I at least look and she said "You arn't sticking me, only through the IV"  Xray arrives to transport pt to xray I told the transporter that when she comes back she can wait in the waiting room, Pt then spoke up and said "I am not waiting in no waiting room" she then stated "I will be calling my lawyer about this"

## 2012-12-25 DIAGNOSIS — J189 Pneumonia, unspecified organism: Secondary | ICD-10-CM | POA: Diagnosis present

## 2012-12-25 LAB — PRO B NATRIURETIC PEPTIDE: Pro B Natriuretic peptide (BNP): 28.6 pg/mL (ref 0–125)

## 2012-12-25 MED ORDER — LEVOFLOXACIN 500 MG PO TABS: 750.0000 mg | ORAL_TABLET | Freq: Every day | ORAL | Status: AC

## 2012-12-25 MED ORDER — LEVOFLOXACIN 750 MG PO TABS
750.0000 mg | ORAL_TABLET | Freq: Once | ORAL | Status: AC
  Administered 2012-12-25: 750 mg via ORAL
  Filled 2012-12-25: qty 1

## 2012-12-25 MED ORDER — LEVOFLOXACIN IN D5W 750 MG/150ML IV SOLN
750.0000 mg | INTRAVENOUS | Status: DC
  Filled 2012-12-25: qty 150

## 2012-12-25 NOTE — ED Notes (Signed)
Pt requesting to leave AMA when this RN went in to give IV antibiotics.  Resident made aware. Resident in to talk to pt.

## 2012-12-26 NOTE — ED Provider Notes (Signed)
I evaluated the patient and discussed her care with our resident physician. I agree with his documentation, with the following additions.  (I also saw the relevant studies, including ECG (if performed) and agree with the interpretation).    Gerhard Munch, MD 12/26/12 [phone]

## 2013-01-04 ENCOUNTER — Emergency Department (HOSPITAL_COMMUNITY): Payer: Medicare Other

## 2013-01-04 ENCOUNTER — Encounter (HOSPITAL_COMMUNITY): Payer: Self-pay | Admitting: Emergency Medicine

## 2013-01-04 ENCOUNTER — Inpatient Hospital Stay (HOSPITAL_COMMUNITY)
Admission: EM | Admit: 2013-01-04 | Discharge: 2013-01-08 | DRG: 440 | Disposition: A | Discharge status: Home / Self Care | Payer: Medicare Other | Attending: Internal Medicine | Admitting: Internal Medicine

## 2013-01-04 DIAGNOSIS — K429 Umbilical hernia without obstruction or gangrene: Secondary | ICD-10-CM | POA: Diagnosis present

## 2013-01-04 DIAGNOSIS — K5900 Constipation, unspecified: Secondary | ICD-10-CM | POA: Diagnosis present

## 2013-01-04 DIAGNOSIS — Z87440 Personal history of urinary (tract) infections: Secondary | ICD-10-CM

## 2013-01-04 DIAGNOSIS — E119 Type 2 diabetes mellitus without complications: Secondary | ICD-10-CM | POA: Diagnosis present

## 2013-01-04 DIAGNOSIS — R109 Unspecified abdominal pain: Secondary | ICD-10-CM | POA: Diagnosis present

## 2013-01-04 DIAGNOSIS — K219 Gastro-esophageal reflux disease without esophagitis: Secondary | ICD-10-CM | POA: Diagnosis present

## 2013-01-04 DIAGNOSIS — J189 Pneumonia, unspecified organism: Secondary | ICD-10-CM

## 2013-01-04 DIAGNOSIS — K439 Ventral hernia without obstruction or gangrene: Secondary | ICD-10-CM | POA: Diagnosis present

## 2013-01-04 DIAGNOSIS — J4489 Other specified chronic obstructive pulmonary disease: Secondary | ICD-10-CM | POA: Diagnosis present

## 2013-01-04 DIAGNOSIS — I10 Essential (primary) hypertension: Secondary | ICD-10-CM | POA: Diagnosis present

## 2013-01-04 DIAGNOSIS — J441 Chronic obstructive pulmonary disease with (acute) exacerbation: Secondary | ICD-10-CM

## 2013-01-04 DIAGNOSIS — K859 Acute pancreatitis without necrosis or infection, unspecified: Principal | ICD-10-CM | POA: Diagnosis present

## 2013-01-04 DIAGNOSIS — E1165 Type 2 diabetes mellitus with hyperglycemia: Secondary | ICD-10-CM

## 2013-01-04 DIAGNOSIS — IMO0001 Reserved for inherently not codable concepts without codable children: Secondary | ICD-10-CM | POA: Diagnosis present

## 2013-01-04 DIAGNOSIS — F17200 Nicotine dependence, unspecified, uncomplicated: Secondary | ICD-10-CM | POA: Diagnosis present

## 2013-01-04 DIAGNOSIS — J449 Chronic obstructive pulmonary disease, unspecified: Secondary | ICD-10-CM | POA: Diagnosis present

## 2013-01-04 DIAGNOSIS — M159 Polyosteoarthritis, unspecified: Secondary | ICD-10-CM

## 2013-01-04 DIAGNOSIS — M545 Low back pain: Secondary | ICD-10-CM

## 2013-01-04 LAB — CBC WITH DIFFERENTIAL/PLATELET
Basophils Absolute: 0 10*3/uL (ref 0.0–0.1)
Basophils Relative: 0 % (ref 0–1)
Eosinophils Absolute: 0.1 10*3/uL (ref 0.0–0.7)
Eosinophils Relative: 2 % (ref 0–5)
HCT: 41.1 % (ref 36.0–46.0)
Lymphocytes Relative: 43 % (ref 12–46)
MCH: 27.5 pg (ref 26.0–34.0)
MCHC: 35.3 g/dL (ref 30.0–36.0)
MCV: 77.8 fL — ABNORMAL LOW (ref 78.0–100.0)
Monocytes Absolute: 0.4 10*3/uL (ref 0.1–1.0)
RDW: 14.5 % (ref 11.5–15.5)

## 2013-01-04 LAB — COMPREHENSIVE METABOLIC PANEL
AST: 11 U/L (ref 0–37)
CO2: 27 mEq/L (ref 19–32)
Calcium: 9.7 mg/dL (ref 8.4–10.5)
Creatinine, Ser: 0.59 mg/dL (ref 0.50–1.10)
GFR calc non Af Amer: >90 mL/min (ref >90)
Total Protein: 7.7 g/dL (ref 6.0–8.3)

## 2013-01-04 NOTE — ED Provider Notes (Signed)
History    CSN: [id number] Arrival date & time 01/04/13  2012  First MD Initiated Contact with Patient 01/04/13 2057     Chief Complaint  Patient presents with  . Abdominal Pain   (Consider location/radiation/quality/duration/timing/severity/associated sxs/prior Treatment) HPI Comments: 57 year old female with 10 days of epigastric abdominal pain. Described as cramping and severe. She states that today she developed right flank pain, which is why she presented to the emergency department today. She has associated decrease in urine output and urinary frequency.  Patient is a 57 y.o. female presenting with abdominal pain.  Abdominal Pain This is a new problem. Episode onset: 10 days. The problem occurs constantly. The problem has been gradually worsening. Associated symptoms include abdominal pain. Pertinent negatives include no chest pain and no shortness of breath. Exacerbated by: movement. Nothing relieves the symptoms.   Past Medical History  Diagnosis Date  . Hypertension   . Asthma   . Gastritis   . Pneumonia   . Bacterial vaginosis   . GERD (gastroesophageal reflux disease)   . Diabetes type 2, uncontrolled     01/04/13- pt denies having diabetes   Past Surgical History  Procedure Laterality Date  . Abdominal hysterectomy     History reviewed. No pertinent family history. History  Substance Use Topics  . Smoking status: Current Every Day Smoker -- 1.50 packs/day for 40 years    Types: Cigarettes  . Smokeless tobacco: Never Used  . Alcohol Use: No   OB History   Grav Para Term Preterm Abortions TAB SAB Ect Mult Living                 Review of Systems  Constitutional: Negative for fever.  HENT: Negative for congestion.   Respiratory: Negative for cough and shortness of breath.   Cardiovascular: Negative for chest pain.  Gastrointestinal: Positive for abdominal pain. Negative for nausea, vomiting and diarrhea.  All other systems reviewed and are  negative.    Allergies  Ibuprofen and Penicillins  Home Medications   Current Outpatient Rx  Name  Route  Sig  Dispense  Refill  . levofloxacin (LEVAQUIN) 500 MG tablet   Oral   Take 750 mg by mouth daily. For 6 days; Start date 12/22/12         . naproxen (NAPROSYN) 500 MG tablet   Oral   Take 500 mg by mouth 2 (two) times daily with a meal.         . traMADol (ULTRAM) 50 MG tablet   Oral   Take 50 mg by mouth every 6 (six) hours as needed for pain.          BP 154/66  Pulse 93  Temp(Src) 99.1 F (37.3 C) (Oral)  Resp 18  SpO2 97% Physical Exam  Nursing note and vitals reviewed. Constitutional: She is oriented to person, place, and time. She appears well-developed and well-nourished. No distress.  HENT:  Head: Normocephalic and atraumatic.  Mouth/Throat: Oropharynx is clear and moist.  Eyes: Conjunctivae are normal. Pupils are equal, round, and reactive to light. No scleral icterus.  Neck: Neck supple.  Cardiovascular: Normal rate, regular rhythm, normal heart sounds and intact distal pulses.   No murmur heard. Pulmonary/Chest: Effort normal and breath sounds normal. No stridor. No respiratory distress. She has no rales.  Abdominal: Soft. Bowel sounds are normal. She exhibits no distension. There is tenderness in the right upper quadrant and epigastric area.  Musculoskeletal: Normal range of motion.  Neurological: She is  alert and oriented to person, place, and time.  Skin: Skin is warm and dry. No rash noted.  Psychiatric: She has a normal mood and affect. Her behavior is normal.    ED Course  Procedures (including critical care time) Labs Reviewed  CBC WITH DIFFERENTIAL - Abnormal; Notable for the following:    RBC 5.28 (*)    MCV 77.8 (*)    All other components within normal limits  COMPREHENSIVE METABOLIC PANEL - Abnormal; Notable for the following:    Glucose, Bld 203 (*)    Alkaline Phosphatase 150 (*)    All other components within normal limits   LIPASE, BLOOD - Abnormal; Notable for the following:    Lipase 522 (*)    All other components within normal limits  URINALYSIS, ROUTINE W REFLEX MICROSCOPIC - Abnormal; Notable for the following:    Color, Urine AMBER (*)    APPearance CLOUDY (*)    Bilirubin Urine SMALL (*)    All other components within normal limits  CBC WITH DIFFERENTIAL  COMPREHENSIVE METABOLIC PANEL  HEMOGLOBIN A1C   Dg Abd Acute W/chest  01/04/2013   *RADIOLOGY REPORT*  Clinical Data: Abdominal pain.  Vomiting.  Constipation.  Last bowel movement 3 weeks ago.  ACUTE ABDOMEN SERIES (ABDOMEN 2 VIEW & CHEST 1 VIEW)  Comparison: Chest 12/24/2012  Findings: Borderline heart size with mild pulmonary vascular congestion.  Mild interstitial changes in the lungs with hazy perihilar opacities may represent edema or pneumonitis.  Changes similar to previous study.  Linear atelectasis or infiltration in the lung bases.  No blunting of costophrenic angles.  No pneumothorax.  Calcification of the aorta.  Abdominal images demonstrate scattered gas and stool throughout the colon.  No small or large bowel distension.  No free intra- abdominal air.  No abnormal air fluid levels.  No radiopaque stones.  Surgical clips in the pelvis.  IMPRESSION: Pulmonary vascular congestion with diffuse interstitial changes and mild perihilar infiltration in the lungs.  This could represent pneumonia or edema.  Similar appearance to previous study. Nonobstructive bowel gas pattern with stool filled colon.   Original Report Authenticated By: Burman Nieves, M.D.   US Abdomen Limited Ruq  01/05/2013   *RADIOLOGY REPORT*  Clinical Data:  Abdominal pain.  COMPLETE ABDOMINAL ULTRASOUND  Comparison:  None.  Findings:  Gallbladder:  No gallstones, gallbladder wall thickening, or pericholecystic fluid.  Common bile duct:  Normal caliber.  Diameter measures 4.4 mm.  Liver:  No focal lesion identified.  Within normal limits in parenchymal echogenicity.  IVC:  Appears  normal.  Pancreas:  Limited visualization of the pancreas due to overlying bowel gas.  No focal lesion is identified.  Pancreatic duct is not dilated.  Spleen:  Spleen length measures 8.9 cm.  Normal parenchymal echotexture.  Right Kidney:  Right kidney measures 13.9 cm length.  No hydronephrosis.  Left Kidney:  Left kidney measures 13.7 cm length.  No hydronephrosis.  Abdominal aorta:  No aneurysm identified.  IMPRESSION: Negative abdominal ultrasound.   Original Report Authenticated By: Burman Nieves, M.D.  plain films viewed independently by me.   1. Pancreatitis     MDM  57 yo female with epigastric and RUQ pain for 1 week.  Worse tonight.  Labs show elevated lipase, admitted to internal medicine.  IV morphine for pain.    Candyce Churn, MD 01/05/13 [phone]

## 2013-01-04 NOTE — ED Notes (Signed)
Generalized abd pain since Friday.  Now having R flank pain and difficulty urinating since this morning. Reports nausea.

## 2013-01-04 NOTE — ED Notes (Signed)
Pt asked could she obtain a urine sample. Pt stated that she cannot at this time but will push her call bell once she feels the urge to go. Pt also stated that she wanted to sit along side of the bed. Pt rail was let down so pt could sit along side of the bed. Nurse made aware and pt was asked to call before she needed to get up.

## 2013-01-05 ENCOUNTER — Encounter (HOSPITAL_COMMUNITY): Payer: Self-pay | Admitting: Radiology

## 2013-01-05 ENCOUNTER — Observation Stay (HOSPITAL_COMMUNITY): Payer: Medicare Other

## 2013-01-05 DIAGNOSIS — K429 Umbilical hernia without obstruction or gangrene: Secondary | ICD-10-CM

## 2013-01-05 DIAGNOSIS — K439 Ventral hernia without obstruction or gangrene: Secondary | ICD-10-CM | POA: Diagnosis present

## 2013-01-05 DIAGNOSIS — K859 Acute pancreatitis without necrosis or infection, unspecified: Principal | ICD-10-CM

## 2013-01-05 DIAGNOSIS — R109 Unspecified abdominal pain: Secondary | ICD-10-CM | POA: Diagnosis present

## 2013-01-05 LAB — COMPREHENSIVE METABOLIC PANEL
ALT: 6 U/L (ref 0–35)
Alkaline Phosphatase: 132 U/L — ABNORMAL HIGH (ref 39–117)
BUN: 7 mg/dL (ref 6–23)
CO2: 23 mEq/L (ref 19–32)
Calcium: 9.1 mg/dL (ref 8.4–10.5)
GFR calc Af Amer: >90 mL/min (ref >90)
GFR calc non Af Amer: >90 mL/min (ref >90)
Glucose, Bld: 166 mg/dL — ABNORMAL HIGH (ref 70–99)
Potassium: 3.7 mEq/L (ref 3.5–5.1)
Sodium: 135 mEq/L (ref 135–145)
Total Protein: 7.1 g/dL (ref 6.0–8.3)

## 2013-01-05 LAB — URINALYSIS, ROUTINE W REFLEX MICROSCOPIC
Glucose, UA: NEGATIVE mg/dL
Leukocytes, UA: NEGATIVE
Protein, ur: NEGATIVE mg/dL
Urobilinogen, UA: 1 mg/dL (ref 0.0–1.0)

## 2013-01-05 LAB — CBC WITH DIFFERENTIAL/PLATELET
Basophils Relative: 0 % (ref 0–1)
Eosinophils Absolute: 0.1 10*3/uL (ref 0.0–0.7)
Eosinophils Relative: 2 % (ref 0–5)
MCH: 26.3 pg (ref 26.0–34.0)
MCHC: 33.3 g/dL (ref 30.0–36.0)
MCV: 78.9 fL (ref 78.0–100.0)
Neutrophils Relative %: 41 % — ABNORMAL LOW (ref 43–77)
Platelets: 232 10*3/uL (ref 150–400)

## 2013-01-05 LAB — LIPID PANEL
HDL: 21 mg/dL — ABNORMAL LOW (ref >39)
LDL Cholesterol: 122 mg/dL — ABNORMAL HIGH (ref 0–99)
Total CHOL/HDL Ratio: 8.4 RATIO
VLDL: 33 mg/dL (ref 0–40)

## 2013-01-05 LAB — GLUCOSE, CAPILLARY
Glucose-Capillary: 113 mg/dL — ABNORMAL HIGH (ref 70–99)
Glucose-Capillary: 116 mg/dL — ABNORMAL HIGH (ref 70–99)
Glucose-Capillary: 97 mg/dL (ref 70–99)

## 2013-01-05 LAB — MAGNESIUM: Magnesium: 2 mg/dL (ref 1.5–2.5)

## 2013-01-05 LAB — HEMOGLOBIN A1C
Hgb A1c MFr Bld: 7.3 % — ABNORMAL HIGH (ref <5.7)
Mean Plasma Glucose: 163 mg/dL — ABNORMAL HIGH (ref <117)

## 2013-01-05 MED ORDER — POTASSIUM CHLORIDE 10 MEQ/100ML IV SOLN
INTRAVENOUS | Status: AC
  Filled 2013-01-05: qty 100

## 2013-01-05 MED ORDER — NICOTINE 21 MG/24HR TD PT24
21.0000 mg | MEDICATED_PATCH | Freq: Every day | TRANSDERMAL | Status: DC
  Administered 2013-01-05 – 2013-01-08 (×5): 21 mg via TRANSDERMAL
  Filled 2013-01-05 (×5): qty 1

## 2013-01-05 MED ORDER — SODIUM CHLORIDE 0.9 % IV BOLUS (SEPSIS)
1000.0000 mL | Freq: Once | INTRAVENOUS | Status: AC
  Administered 2013-01-05: 1000 mL via INTRAVENOUS

## 2013-01-05 MED ORDER — SODIUM CHLORIDE 0.9 % IV SOLN
INTRAVENOUS | Status: DC
  Administered 2013-01-05 (×3): via INTRAVENOUS

## 2013-01-05 MED ORDER — MORPHINE SULFATE 4 MG/ML IJ SOLN
4.0000 mg | Freq: Once | INTRAMUSCULAR | Status: AC
  Administered 2013-01-05: 4 mg via INTRAVENOUS
  Filled 2013-01-05 (×2): qty 1

## 2013-01-05 MED ORDER — ONDANSETRON HCL 4 MG/2ML IJ SOLN
4.0000 mg | Freq: Once | INTRAMUSCULAR | Status: AC
  Administered 2013-01-05: 4 mg via INTRAVENOUS
  Filled 2013-01-05: qty 2

## 2013-01-05 MED ORDER — MORPHINE SULFATE 2 MG/ML IJ SOLN
2.0000 mg | INTRAMUSCULAR | Status: DC | PRN
  Administered 2013-01-05 (×2): 2 mg via INTRAVENOUS
  Filled 2013-01-05 (×2): qty 1

## 2013-01-05 MED ORDER — POTASSIUM CHLORIDE 10 MEQ/100ML IV SOLN
10.0000 meq | INTRAVENOUS | Status: DC
  Administered 2013-01-05: 10 meq via INTRAVENOUS

## 2013-01-05 MED ORDER — MORPHINE SULFATE 2 MG/ML IJ SOLN
2.0000 mg | INTRAMUSCULAR | Status: DC | PRN
  Administered 2013-01-05: 4 mg via INTRAVENOUS
  Administered 2013-01-05 – 2013-01-07 (×3): 2 mg via INTRAVENOUS
  Filled 2013-01-05 (×3): qty 1
  Filled 2013-01-05: qty 2
  Filled 2013-01-05: qty 1

## 2013-01-05 MED ORDER — INSULIN ASPART 100 UNIT/ML ~~LOC~~ SOLN
0.0000 [IU] | SUBCUTANEOUS | Status: DC
  Administered 2013-01-05 – 2013-01-06 (×2): 2 [IU] via SUBCUTANEOUS
  Administered 2013-01-06: 3 [IU] via SUBCUTANEOUS
  Administered 2013-01-06 (×2): 2 [IU] via SUBCUTANEOUS
  Administered 2013-01-07 (×2): 3 [IU] via SUBCUTANEOUS
  Administered 2013-01-07: 2 [IU] via SUBCUTANEOUS
  Filled 2013-01-05: qty 1

## 2013-01-05 MED ORDER — IOHEXOL 300 MG/ML  SOLN
100.0000 mL | Freq: Once | INTRAMUSCULAR | Status: AC | PRN
  Administered 2013-01-05: 100 mL via INTRAVENOUS

## 2013-01-05 MED ORDER — HYDRALAZINE HCL 20 MG/ML IJ SOLN: 10.0000 mg | Freq: Four times a day (QID) | INTRAMUSCULAR | Status: DC | PRN

## 2013-01-05 MED ORDER — IOHEXOL 300 MG/ML  SOLN: 25.0000 mL | INTRAMUSCULAR | Status: AC

## 2013-01-05 MED ORDER — HEPARIN SODIUM (PORCINE) 5000 UNIT/ML IJ SOLN
5000.0000 [IU] | Freq: Three times a day (TID) | INTRAMUSCULAR | Status: DC
  Administered 2013-01-05 – 2013-01-06 (×4): 5000 [IU] via SUBCUTANEOUS
  Filled 2013-01-05 (×9): qty 1

## 2013-01-05 NOTE — Progress Notes (Signed)
Inpatient Diabetes Program Recommendations  AACE/ADA: New Consensus Statement on Inpatient Glycemic Control (2013)  Target Ranges:  Prepandial:   less than 140 mg/dL      Peak postprandial:   less than 180 mg/dL (1-2 hours)      Critically ill patients:  140 - 180 mg/dL     Admitted with pancreatitis.  Has history of Type 2 DM but not taking any medications at home for her DM.  Last A1c on record was 7.3% (07/01/2011).  Received referral for this patient "diabetes denial".  Attempted to speak with patient, however, she was sound asleep and did not want to disturb her.  Will attempt to revisit tomorrow.  MD- Please check current A1c. Last one on file was from 2013.   Will follow. Ambrose Finland RN, MSN, CDE Diabetes Coordinator Inpatient Diabetes Program [phone]

## 2013-01-05 NOTE — Consult Note (Signed)
Reason for Consult: PANCREATITIS and Umbilical Hernia with possible fat necrosis on CT scan. Referring Physician: Rodolph Bong, MD PCP:  [facility] Sarah [name] is an 57 y.o. female.  HPI: 57 y/o seen on 7/9 with chest pain and diagnosed with Pneumonia.  She was sent home on abx, and says she started having abdominal pain at that time.  Pain was chronic, she said "[place] [address]," made it better.  Food made it worse.  She tolerated it thinking the antibiotics were the cause and it would get better with completion of the antibiotics. She has had some episodic nausea and vomiting but it is not a regular feature.  Vomiting sounds more like reflux.   She says it's never got better and she came to the hospital because, "her stomach hurt all the time."  She said it hurt to move and pain would go from her abdomen to her back.  She continue to complain of pain now.  It is diffuse, it is primarily located around her umbilicus, but on exam she has diffuse pain, more on the right than the left, but all quadrants are tender.  She also states she cannot remember last BM. Work up in the ER included 3 way abd showing diffuse pulmonary congerstion with interstitial changes possibly pneumonia or edema.  ABd Ultrasound was normla, no acute changes noted.  CT scan shows mild inflammatory changes around head of pancreatitis, and Umbilical line supraumbilical abdominal wall hernias containing fat. Suggestion of fat necrosis in the supraumbilical hernia. Labs show a lipase of 522, glucose of 203 and WBC of 5.8.  We were ask to see in consult and consider for the umbilical hernia.  Past Medical History  Diagnosis Date  . Hypertension   . Asthma   . Gastritis   . Recurrent Pneumonia recent CPAP treatment 12/24/12   . Bacterial vaginosis   . GERD (gastroesophageal reflux disease)   . Diabetes type 2, uncontrolled  ( she says she does not have this.)     01/04/13- pt denies having diabetes   COPD with ongoing  tobacco use    .BMI 35    Recurrent Confusion 06/28/11 admit   . Abdominal wall hernia 01/05/2013    Past Surgical History  Procedure Laterality Date  . Abdominal hysterectomy      History reviewed. No pertinent family history.  Social History:  reports that she has been smoking Cigarettes.  She has a 60 pack-year smoking history. She has never used smokeless tobacco. She reports that she does not drink alcohol or use illicit drugs.  Allergies:  Allergies  Allergen Reactions  . Ibuprofen Other (See Comments)    unknown  . Penicillins Other (See Comments)    unknown    Medications:  Prior to Admission:  Prescriptions prior to admission  Medication Sig Dispense Refill  . levofloxacin (LEVAQUIN) 500 MG tablet Take 750 mg by mouth daily. For 6 days; Start date 12/22/12      . naproxen (NAPROSYN) 500 MG tablet Take 500 mg by mouth 2 (two) times daily with a meal.      . traMADol (ULTRAM) 50 MG tablet Take 50 mg by mouth every 6 (six) hours as needed for pain.       Scheduled: . heparin  5,000 Units Subcutaneous Q8H  . insulin aspart  0-15 Units Subcutaneous Q4H  . nicotine  21 mg Transdermal Daily  . potassium chloride       Continuous: . sodium chloride 150  mL/hr at 01/05/13 1009   AVW:[id number], morphine injection Anti-infectives   None      Results for orders placed during the hospital encounter of 01/04/13 (from the past 48 hour(s))  CBC WITH DIFFERENTIAL     Status: Abnormal   Collection Time    01/04/13  8:52 PM      Result Value Range   WBC 5.8  4.0 - 10.5 K/uL   RBC 5.28 (*) 3.87 - 5.11 MIL/uL   Hemoglobin 14.5  12.0 - 15.0 g/dL   HCT 95.6  21.3 - 08.6 %   MCV 77.8 (*) 78.0 - 100.0 fL   MCH 27.5  26.0 - 34.0 pg   MCHC 35.3  30.0 - 36.0 g/dL   RDW 57.8  46.9 - 62.9 %   Platelets 244  150 - 400 K/uL   Neutrophils Relative % 48  43 - 77 %   Neutro Abs 2.8  1.7 - 7.7 K/uL   Lymphocytes Relative 43  12 - 46 %   Lymphs Abs 2.5  0.7 - 4.0 K/uL   Monocytes  Relative 7  3 - 12 %   Monocytes Absolute 0.4  0.1 - 1.0 K/uL   Eosinophils Relative 2  0 - 5 %   Eosinophils Absolute 0.1  0.0 - 0.7 K/uL   Basophils Relative 0  0 - 1 %   Basophils Absolute 0.0  0.0 - 0.1 K/uL  COMPREHENSIVE METABOLIC PANEL     Status: Abnormal   Collection Time    01/04/13  8:52 PM      Result Value Range   Sodium 135  135 - 145 mEq/L   Potassium 3.5  3.5 - 5.1 mEq/L   Chloride 98  96 - 112 mEq/L   CO2 27  19 - 32 mEq/L   Glucose, Bld 203 (*) 70 - 99 mg/dL   BUN 10  6 - 23 mg/dL   Creatinine, Ser 5.28  0.50 - 1.10 mg/dL   Calcium 9.7  8.4 - 41.3 mg/dL   Total Protein 7.7  6.0 - 8.3 g/dL   Albumin 3.5  3.5 - 5.2 g/dL   AST 11  0 - 37 U/L   ALT 7  0 - 35 U/L   Alkaline Phosphatase 150 (*) 39 - 117 U/L   Total Bilirubin 0.5  0.3 - 1.2 mg/dL   GFR calc non Af Amer >90  >90 mL/min   GFR calc Af Amer >90  >90 mL/min   Comment:            The eGFR has been calculated     using the CKD EPI equation.     This calculation has not been     validated in all clinical     situations.     eGFR's persistently     <90 mL/min signify     possible Chronic Kidney Disease.  LIPASE, BLOOD     Status: Abnormal   Collection Time    01/04/13  8:52 PM      Result Value Range   Lipase 522 (*) 11 - 59 U/L  URINALYSIS, ROUTINE W REFLEX MICROSCOPIC     Status: Abnormal   Collection Time    01/05/13 12:22 AM      Result Value Range   Color, Urine AMBER (*) YELLOW   Comment: BIOCHEMICALS MAY BE AFFECTED BY COLOR   APPearance CLOUDY (*) CLEAR   Specific Gravity, Urine 1.025  1.005 - 1.030   pH 6.0  5.0 - 8.0   Glucose, UA NEGATIVE  NEGATIVE mg/dL   Hgb urine dipstick NEGATIVE  NEGATIVE   Bilirubin Urine SMALL (*) NEGATIVE   Ketones, ur NEGATIVE  NEGATIVE mg/dL   Protein, ur NEGATIVE  NEGATIVE mg/dL   Urobilinogen, UA 1.0  0.0 - 1.0 mg/dL   Nitrite NEGATIVE  NEGATIVE   Leukocytes, UA NEGATIVE  NEGATIVE   Comment: MICROSCOPIC NOT DONE ON URINES WITH NEGATIVE PROTEIN, BLOOD,  LEUKOCYTES, NITRITE, OR GLUCOSE <1000 mg/dL.  GLUCOSE, CAPILLARY     Status: Abnormal   Collection Time    01/05/13  7:48 AM      Result Value Range   Glucose-Capillary 177 (*) 70 - 99 mg/dL   Comment 1 Notify RN      Dg Chest 2 View  01/05/2013   *RADIOLOGY REPORT*  Clinical Data: Hypertension, smoker, abdominal pain  CHEST - 2 VIEW  Comparison: 01/04/2013  Findings: Stable heart size and vascularity.  Lingula atelectasis versus scarring persist.  No superimposed pneumonia, edema or effusion.  No pneumothorax.  Trachea midline.  Exam is limited because of technique and body habitus.  IMPRESSION: Stable vascular congestion and lingula atelectasis versus scarring. No interval change.   Original Report Authenticated By: Judie Petit. Miles Costain, M.D.   Ct Abdomen Pelvis W Contrast  01/05/2013   *RADIOLOGY REPORT*  Clinical Data: Diffuse abdominal pain.  Elevated lipase.  Right flank pain.  Difficulty urinating.  Nausea, vomiting, constipation.  CT ABDOMEN AND PELVIS WITH CONTRAST  Technique:  Multidetector CT imaging of the abdomen and pelvis was performed following the standard protocol during bolus administration of intravenous contrast.  Contrast: OMNIPAQUE IOHEXOL 300 MG/ML  SOLN  Comparison: None.  Findings: Slight fibrosis in the right lung base.  There is minimal prominence of the head of the pancreas with minimal infiltration around the head of the pancreas.  This could represent early changes of mild acute pancreatitis.  The pancreatic duct is not dilated in the remainder the pancreas is unremarkable. No peripancreatic fluid collections.  There is a left adrenal gland nodule measuring 2.1 cm diameter and right adrenal gland nodule measuring 1.5 x 3.9 cm.  Hounsfield unit measurements are indeterminate on contrast enhanced scan.  Consider further characterization with noncontrast CT or MRI.  Umbilical hernia and supra umbilical hernia containing fat.  The supra umbilical hernia contains some fat stranding  which may represent focal fat necrosis.  The liver, spleen, gallbladder, inferior vena cava, and retroperitoneal lymph nodes are unremarkable.  Sub centimeter lesions in the kidneys consistent with cysts.  The nephrograms are somewhat heterogeneous bilaterally.  This may be due to early imaging on the delayed phase but pyelonephritis is not excluded. The no hydronephrosis in either kidney.  The stomach and small bowel are decompressed.  Stool filled colon without distension.  No free air or free fluid in the abdomen.  Pelvis:  The uterus appears to be surgically absent.  The bladder wall is not thickened.  No free or loculated pelvic fluid collections.  Stool filled rectosigmoid colon without diverticulitis.  The appendix is not identified.  No significant pelvic lymphadenopathy.  Normal alignment of the lumbar vertebra with mild degenerative change.  IMPRESSION: Possible minimal inflammatory changes around the head of the pancreas could represent early acute focal pancreatitis. Heterogeneous renal parenchymal nephrograms bilaterally may represent pyelonephritis due to artifact of image timing. Umbilical line supraumbilical abdominal wall hernias containing fat.  Suggestion of fat necrosis in the supraumbilical hernia.   Original Report Authenticated By:  Burman Nieves, M.D.   Dg Abd Acute W/chest  01/04/2013   *RADIOLOGY REPORT*  Clinical Data: Abdominal pain.  Vomiting.  Constipation.  Last bowel movement 3 weeks ago.  ACUTE ABDOMEN SERIES (ABDOMEN 2 VIEW & CHEST 1 VIEW)  Comparison: Chest 12/24/2012  Findings: Borderline heart size with mild pulmonary vascular congestion.  Mild interstitial changes in the lungs with hazy perihilar opacities may represent edema or pneumonitis.  Changes similar to previous study.  Linear atelectasis or infiltration in the lung bases.  No blunting of costophrenic angles.  No pneumothorax.  Calcification of the aorta.  Abdominal images demonstrate scattered gas and stool  throughout the colon.  No small or large bowel distension.  No free intra- abdominal air.  No abnormal air fluid levels.  No radiopaque stones.  Surgical clips in the pelvis.  IMPRESSION: Pulmonary vascular congestion with diffuse interstitial changes and mild perihilar infiltration in the lungs.  This could represent pneumonia or edema.  Similar appearance to previous study. Nonobstructive bowel gas pattern with stool filled colon.   Original Report Authenticated By: Burman Nieves, M.D.   US Abdomen Limited Ruq  01/05/2013   *RADIOLOGY REPORT*  Clinical Data:  Abdominal pain.  COMPLETE ABDOMINAL ULTRASOUND  Comparison:  None.  Findings:  Gallbladder:  No gallstones, gallbladder wall thickening, or pericholecystic fluid.  Common bile duct:  Normal caliber.  Diameter measures 4.4 mm.  Liver:  No focal lesion identified.  Within normal limits in parenchymal echogenicity.  IVC:  Appears normal.  Pancreas:  Limited visualization of the pancreas due to overlying bowel gas.  No focal lesion is identified.  Pancreatic duct is not dilated.  Spleen:  Spleen length measures 8.9 cm.  Normal parenchymal echotexture.  Right Kidney:  Right kidney measures 13.9 cm length.  No hydronephrosis.  Left Kidney:  Left kidney measures 13.7 cm length.  No hydronephrosis.  Abdominal aorta:  No aneurysm identified.  IMPRESSION: Negative abdominal ultrasound.   Original Report Authenticated By: Burman Nieves, M.D.    Review of Systems  Constitutional: Positive for weight loss (think she may have but not sure.  doesn't like food, likes [employer] candy bars.). Negative for fever and chills.       She is confused and falls asleep while I was talking to her twice.  HENT: Negative.   Eyes: Positive for blurred vision.       She's not sure, says she has eye infections too.  Respiratory: Positive for cough, sputum production (productive cream colored sputum), shortness of breath (she gets SOB walking in house.   uses cart to shop in  store does not walk) and wheezing.   Cardiovascular: Positive for chest pain (with pneumonia when seen 12/24/12) and leg swelling. Negative for palpitations, orthopnea and claudication.  Gastrointestinal: Positive for heartburn, nausea (occasional), vomiting (occasional), abdominal pain (says it has hurt since she started on antibiotics 7/9.  she says it was worse and now goes to her back.) and constipation (Doesn't know when she had her last BM, she said a month). Negative for diarrhea, blood in stool and melena.       She says [employer] bars make her stomach feel better, food make her feel bad.  Genitourinary: Negative.   Musculoskeletal: Positive for back pain and joint pain.  Skin: Negative.   Neurological: Negative.   Endo/Heme/Allergies: Negative.   Psychiatric/Behavioral: Negative.    Blood pressure 157/80, pulse 88, temperature 98.4 F (36.9 C), temperature source Oral, resp. rate 18, height 5\' 8"  (  1.727 m), weight 102.921 kg (226 lb 14.4 oz), SpO2 98.00%. Physical Exam  Constitutional: She appears well-developed and well-nourished.  Body mass index is 34.51 kg/(m^2). Pt with poor memory, confused at times and fell asleep twice while I was talking with her.  One time ask how long I had been sitting there. BP 157/80  Pulse 88  Temp(Src) 98.4 F (36.9 C) (Oral)  Resp 18  Ht 5\' 8"  (1.727 m)  Wt 102.921 kg (226 lb 14.4 oz)  BMI 34.51 kg/m2  SpO2 98%    HENT:  Head: Normocephalic and atraumatic.  Nose: Nose normal.  Mouth/Throat: No oropharyngeal exudate.  Eyes: Pupils are equal, round, and reactive to light.  Both conjunctiva was injected in appearance  Neck: Normal range of motion. Neck supple. No JVD present. No tracheal deviation present. No thyromegaly present.  Cardiovascular: Normal rate, regular rhythm, normal heart sounds and intact distal pulses.  Exam reveals no gallop.   No murmur heard. Respiratory: Effort normal. No stridor. No respiratory distress. She has no  wheezes. She has rales (few in base, productive brown to cream colored sputum .).  GI: Soft. Bowel sounds are normal. She exhibits no distension and no mass. There is tenderness (She is tender all over, more midline and right than left, but some on the left too.  Says it was going to back , but is not nowl). There is no rebound and no guarding.  She has a small umbilical hernia, but no pain with cough or manipulation of umbilicus.  Musculoskeletal: She exhibits edema (trace) and tenderness (her legs are tender to palpation).  Lymphadenopathy:    She has no cervical adenopathy.  Neurological: She is alert. No cranial nerve deficit.  Skin: Skin is warm and dry. No rash noted. No erythema. No pallor.  Psychiatric:  She is confused at time I examined her could not tell me day of week.  Struggled with year, Not sure of day she came to hospital.  Not sure how many days she has been here.  Fell asleep a couple times while I was talking with her.  Wanted to know how long I had been sitting there one time.    Assessment/Plan: 1. Pancreatitis 2.Umbilical hernia  3. Recurrent pneumonia 4. COPD with ongoing tobacco use 5.Hypertension 6. Diabetes, but pt denies 7.Confusion and hypersomnolence. 8. Constipation by hx and on CT 9.GERD with some reflux 10.Osteoarthritis 11.BMI of 34.6  Plan:  I don't think the hernia and fat necrosis is a major concern.ibuprofen will talk with Dr. Magnus Ivan.  Sarah [name] 01/05/2013, 11:05 AM

## 2013-01-05 NOTE — Progress Notes (Signed)
I have seen and assessed patient and agree with Dr Cartwright Blas assessment and plan. Patient with diffuse abdominal pain in epigastrium and paraumbilical and lower abdominal areas. Patient noted on CT scan to have possible early pancreatitis and also abdominal wall hernias with concern for fat necrosis. Continue bowel rest. Increase IVF to NS 150cc/hr, pain management. Lipid panel pending. Continue SSI. Consult with [facility] for further eval of probable fat necrosis of abdominal wall hernias.

## 2013-01-05 NOTE — H&P (Signed)
Hospitalist Admission History and Physical  Patient name: Sarah [name] Medical record number: [id number] Date of birth: 1955-07-20 Age: 57 y.o. Gender: female  Primary Care Provider: No PCP Per Patient  Chief Complaint: abdominal pain; pancreatitis  History of Present Illness:This is a 57 y.o. year old female with significant past medical history of DM, COPD, HTN presenting with abdominal pain and elevated lipase. Pt states that she has had diffuse abdominal pain over the last 4-5 days. Pain is generalized with some radiation to back. Also with nausea and dry heaving. No fevers of chills. Pt was seen last week for CP/SOB. Was diagnosed with CAP. Placed on levaquin. Pt states that abd pain started when medication was started. Pt denies any ETOH use. Does smoke 1PPD. No prior hx/o GI issues per pt. No diarrhea.  In ER, bloodwork obtained showing lipase of 555. Abd [place] also obtained that was WNL.    Patient Active Problem List   Diagnosis Date Noted  . Community acquired pneumonia 12/25/2012  . Diabetes type 2, uncontrolled   . COPD with acute exacerbation 06/28/2011  . Hemoptysis 06/28/2011  . Pneumonia 06/27/2011  . HTN (hypertension) 06/27/2011  . TOBACCO ABUSE 02/05/2007  . CONSTIPATION NOS 02/05/2007  . OSTEOARTHROSIS, GENERALIZED, MULTIPLE SITES 02/05/2007  . LOW BACK PAIN 02/05/2007  . HX, URINARY INFECTION 02/05/2007   Past Medical History: Past Medical History  Diagnosis Date  . Hypertension   . Asthma   . Gastritis   . Pneumonia   . Bacterial vaginosis   . GERD (gastroesophageal reflux disease)   . Diabetes type 2, uncontrolled     01/04/13- pt denies having diabetes    Past Surgical History: Past Surgical History  Procedure Laterality Date  . Abdominal hysterectomy      Social History: History   Social History  . Marital Status: Married    Spouse Name: N/A    Number of Children: N/A  . Years of Education: N/A   Social History Main Topics  . Smoking status:  Current Every Day Smoker -- 1.50 packs/day for 40 years    Types: Cigarettes  . Smokeless tobacco: Never Used  . Alcohol Use: No  . Drug Use: No  . Sexually Active: None   Other Topics Concern  . None   Social History Narrative  . None    Family History: History reviewed. No pertinent family history.  Allergies: Allergies  Allergen Reactions  . Ibuprofen Other (See Comments)    unknown  . Penicillins Other (See Comments)    unknown    Current Facility-Administered Medications  Medication Dose Route Frequency Provider Last Rate Last Dose  . 0.9 %  sodium chloride infusion   Intravenous Continuous Doree Albee, MD      . heparin injection 5,000 Units  5,000 Units Subcutaneous Q8H Doree Albee, MD      . insulin aspart (novoLOG) injection 0-15 Units  0-15 Units Subcutaneous Q4H Doree Albee, MD      . morphine 2 MG/ML injection 2-4 mg  2-4 mg Intravenous Q2H PRN Doree Albee, MD      . morphine 4 MG/ML injection 4 mg  4 mg Intravenous Once Candyce Churn, MD      . ondansetron [facility]) injection 4 mg  4 mg Intravenous Once Candyce Churn, MD       Current Outpatient Prescriptions  Medication Sig Dispense Refill  . levofloxacin (LEVAQUIN) 500 MG tablet Take 750 mg by mouth daily. For 6 days; Start date 12/22/12      .  naproxen (NAPROSYN) 500 MG tablet Take 500 mg by mouth 2 (two) times daily with a meal.      . traMADol (ULTRAM) 50 MG tablet Take 50 mg by mouth every 6 (six) hours as needed for pain.       Review Of Systems: 12 point ROS negative except as noted above in HPI.  Physical Exam: Filed Vitals:   01/04/13 2045  BP: 159/87  Pulse: 87  Temp:   Resp: 18    General: alert and in mild distress 2/2 pain  HEENT: PERRLA and extra ocular movement intact Heart: S1, S2 normal, no murmur, rub or gallop, regular rate and rhythm Lungs: clear to auscultation, no wheezes or rales and unlabored breathing Abdomen: diffuse abdominal TTP  Extremities: extremities  normal, atraumatic, no cyanosis or edema Skin:no rashes, no ecchymoses Neurology: normal without focal findings  Labs and Imaging: Lab Results  Component Value Date/Time   NA 135 01/04/2013  8:52 PM   K 3.5 01/04/2013  8:52 PM   CL 98 01/04/2013  8:52 PM   CO2 27 01/04/2013  8:52 PM   BUN 10 01/04/2013  8:52 PM   CREATININE 0.59 01/04/2013  8:52 PM   GLUCOSE 203* 01/04/2013  8:52 PM   Lab Results  Component Value Date   WBC 5.8 01/04/2013   HGB 14.5 01/04/2013   HCT 41.1 01/04/2013   MCV 77.8* 01/04/2013   PLT 244 01/04/2013    Dg Abd Acute W/chest  01/04/2013   *RADIOLOGY REPORT*  Clinical Data: Abdominal pain.  Vomiting.  Constipation.  Last bowel movement 3 weeks ago.  ACUTE ABDOMEN SERIES (ABDOMEN 2 VIEW & CHEST 1 VIEW)  Comparison: Chest 12/24/2012  Findings: Borderline heart size with mild pulmonary vascular congestion.  Mild interstitial changes in the lungs with hazy perihilar opacities may represent edema or pneumonitis.  Changes similar to previous study.  Linear atelectasis or infiltration in the lung bases.  No blunting of costophrenic angles.  No pneumothorax.  Calcification of the aorta.  Abdominal images demonstrate scattered gas and stool throughout the colon.  No small or large bowel distension.  No free intra- abdominal air.  No abnormal air fluid levels.  No radiopaque stones.  Surgical clips in the pelvis.  IMPRESSION: Pulmonary vascular congestion with diffuse interstitial changes and mild perihilar infiltration in the lungs.  This could represent pneumonia or edema.  Similar appearance to previous study. Nonobstructive bowel gas pattern with stool filled colon.   Original Report Authenticated By: Burman Nieves, M.D.   US Abdomen Limited Ruq  01/05/2013   *RADIOLOGY REPORT*  Clinical Data:  Abdominal pain.  COMPLETE ABDOMINAL ULTRASOUND  Comparison:  None.  Findings:  Gallbladder:  No gallstones, gallbladder wall thickening, or pericholecystic fluid.  Common bile duct:  Normal  caliber.  Diameter measures 4.4 mm.  Liver:  No focal lesion identified.  Within normal limits in parenchymal echogenicity.  IVC:  Appears normal.  Pancreas:  Limited visualization of the pancreas due to overlying bowel gas.  No focal lesion is identified.  Pancreatic duct is not dilated.  Spleen:  Spleen length measures 8.9 cm.  Normal parenchymal echotexture.  Right Kidney:  Right kidney measures 13.9 cm length.  No hydronephrosis.  Left Kidney:  Left kidney measures 13.7 cm length.  No hydronephrosis.  Abdominal aorta:  No aneurysm identified.  IMPRESSION: Negative abdominal ultrasound.   Original Report Authenticated By: Burman Nieves, M.D.     Assessment and Plan: SUEZETTE LAFAVE is a 57 y.o.  year old female presenting with abdominal pain concerning for pancreatitis.   Pancreatitis: Unclear etiology though medication may be cause. Check ETOH level. Check lipid profile. pain control.  Hydration. Check CT Abd and pelvis to better assess anatomy given diffuse severe abd pain.  No gallstones on abd [place]. ADAT. No leukocytosis or other signs of systemic infection currently. Will continue to closely follow.  Hx/o PNA: recheck CXR. No resp distress or hypoxia currently  DM: SSI. A1C.  FEN/GI: ADAT.  Prophylaxis: sub q heparin.  Disposition: pending further evaluation.  Code Status:full code.        Doree Albee MD  Pager: [phone]

## 2013-01-05 NOTE — ED Notes (Signed)
Carollee Herter RN informed of pt's delay to floor

## 2013-01-05 NOTE — Consult Note (Signed)
I have seen and examined the patient and agree with the assessment and plans.  Pain and tenderness is from the pancreatitis.  Suspect the hernia has been there for some time.  Does not need emergent repair.  Jordin Dambrosio A. Magnus Ivan  MD, FACS

## 2013-01-05 NOTE — Progress Notes (Signed)
Pt refuses CBGs and being stuck  Says that it is not the reason why she has been admitted to the hospital and does not feel the need.  Pt education provided regarding DM and pancreatitis.  Will continue to monitor.

## 2013-01-06 DIAGNOSIS — I10 Essential (primary) hypertension: Secondary | ICD-10-CM

## 2013-01-06 DIAGNOSIS — R109 Unspecified abdominal pain: Secondary | ICD-10-CM

## 2013-01-06 LAB — COMPREHENSIVE METABOLIC PANEL
AST: 10 U/L (ref 0–37)
Albumin: 3.1 g/dL — ABNORMAL LOW (ref 3.5–5.2)
BUN: 6 mg/dL (ref 6–23)
Calcium: 9 mg/dL (ref 8.4–10.5)
Chloride: 104 mEq/L (ref 96–112)
Creatinine, Ser: 0.59 mg/dL (ref 0.50–1.10)
Total Bilirubin: 0.7 mg/dL (ref 0.3–1.2)
Total Protein: 6.9 g/dL (ref 6.0–8.3)

## 2013-01-06 LAB — CBC WITH DIFFERENTIAL/PLATELET
Basophils Absolute: 0 10*3/uL (ref 0.0–0.1)
Basophils Relative: 1 % (ref 0–1)
Eosinophils Absolute: 0.1 10*3/uL (ref 0.0–0.7)
Eosinophils Relative: 2 % (ref 0–5)
HCT: 35.5 % — ABNORMAL LOW (ref 36.0–46.0)
Hemoglobin: 12.3 g/dL (ref 12.0–15.0)
MCH: 27.2 pg (ref 26.0–34.0)
MCHC: 34.6 g/dL (ref 30.0–36.0)
Monocytes Absolute: 0.3 10*3/uL (ref 0.1–1.0)
Monocytes Relative: 6 % (ref 3–12)
RDW: 14.4 % (ref 11.5–15.5)

## 2013-01-06 LAB — GLUCOSE, CAPILLARY
Glucose-Capillary: 126 mg/dL — ABNORMAL HIGH (ref 70–99)
Glucose-Capillary: 121 mg/dL — ABNORMAL HIGH (ref 70–99)
Glucose-Capillary: 199 mg/dL — ABNORMAL HIGH (ref 70–99)

## 2013-01-06 LAB — LIPASE, BLOOD: Lipase: 152 U/L — ABNORMAL HIGH (ref 11–59)

## 2013-01-06 MED ORDER — SODIUM CHLORIDE 0.9 % IV SOLN
INTRAVENOUS | Status: DC
  Administered 2013-01-06 – 2013-01-07 (×2): via INTRAVENOUS
  Filled 2013-01-06 (×11): qty 1000

## 2013-01-06 MED ORDER — AMLODIPINE BESYLATE 5 MG PO TABS
5.0000 mg | ORAL_TABLET | Freq: Once | ORAL | Status: AC
  Administered 2013-01-06: 5 mg via ORAL
  Filled 2013-01-06: qty 1

## 2013-01-06 MED ORDER — PANTOPRAZOLE SODIUM 40 MG IV SOLR
40.0000 mg | INTRAVENOUS | Status: DC
  Administered 2013-01-06: 40 mg via INTRAVENOUS
  Filled 2013-01-06: qty 40

## 2013-01-06 MED ORDER — AMLODIPINE BESYLATE 5 MG PO TABS
5.0000 mg | ORAL_TABLET | Freq: Every day | ORAL | Status: DC
  Administered 2013-01-06: 5 mg via ORAL
  Filled 2013-01-06: qty 1

## 2013-01-06 MED ORDER — AMLODIPINE BESYLATE 10 MG PO TABS
10.0000 mg | ORAL_TABLET | Freq: Every day | ORAL | Status: DC
  Administered 2013-01-07 – 2013-01-08 (×2): 10 mg via ORAL
  Filled 2013-01-06 (×2): qty 1

## 2013-01-06 MED ORDER — OXYCODONE HCL 5 MG PO TABS
5.0000 mg | ORAL_TABLET | ORAL | Status: DC | PRN
  Administered 2013-01-06 – 2013-01-08 (×3): 5 mg via ORAL
  Filled 2013-01-06 (×3): qty 1

## 2013-01-06 MED ORDER — METOPROLOL TARTRATE 1 MG/ML IV SOLN: 2.5000 mg | Freq: Three times a day (TID) | INTRAVENOUS | Status: DC

## 2013-01-06 MED ORDER — PANTOPRAZOLE SODIUM 40 MG PO TBEC
40.0000 mg | DELAYED_RELEASE_TABLET | Freq: Every day | ORAL | Status: DC
  Administered 2013-01-07 – 2013-01-08 (×2): 40 mg via ORAL
  Filled 2013-01-06 (×2): qty 1

## 2013-01-06 NOTE — Progress Notes (Signed)
I have seen and examined the patient and agree with the assessment and plans. No plans for surgery at this point. Can see as outpt regarding the hernia  Myquan Schaumburg A. Magnus Ivan  MD, FACS

## 2013-01-06 NOTE — Progress Notes (Signed)
Subjective: Doing well. No complaints of pain right now.  Objective: Vital signs in last 24 hours: Temp:  [97.9 F (36.6 C)-98.1 F (36.7 C)] 98.1 F (36.7 C) (07/22 0525) Pulse Rate:  [60-61] 60 (07/22 0525) Resp:  [18-20] 18 (07/22 0525) BP: (143-154)/(65-88) 154/88 mmHg (07/22 0525) SpO2:  [97 %-98 %] 98 % (07/22 0525) Last BM Date:  (Prior to admiission)  Intake/Output from previous day:   Intake/Output this shift:    General appearance: alert, cooperative and no distress GI: soft, non-tender, small supraumbilical midline hernia only apparent when patient stood up, no tenderness with manipulation  Lab Results:   Recent Labs  01/05/13 1044 01/06/13 0608  WBC 4.5 4.1  HGB 12.8 12.3  HCT 38.4 35.5*  PLT 232 201    BMET  Recent Labs  01/05/13 1041 01/06/13 0608  NA 135 136  K 3.7 3.5  CL 102 104  CO2 23 25  GLUCOSE 166* 127*  BUN 7 6  CREATININE 0.63 0.59  CALCIUM 9.1 9.0   PT/INR No results found for this basename: LABPROT, INR,  in the last 72 hours   Recent Labs Lab 01/04/13 2052 01/05/13 1041 01/06/13 0608  AST 11 8 10   ALT 7 6 7   ALKPHOS 150* 132* 128*  BILITOT 0.5 0.6 0.7  PROT 7.7 7.1 6.9  ALBUMIN 3.5 3.4* 3.1*     Lipase     Component Value Date/Time   LIPASE 152* 01/06/2013 1610     Studies/Results: Dg Chest 2 View  01/05/2013   *RADIOLOGY REPORT*  Clinical Data: Hypertension, smoker, abdominal pain  CHEST - 2 VIEW  Comparison: 01/04/2013  Findings: Stable heart size and vascularity.  Lingula atelectasis versus scarring persist.  No superimposed pneumonia, edema or effusion.  No pneumothorax.  Trachea midline.  Exam is limited because of technique and body habitus.  IMPRESSION: Stable vascular congestion and lingula atelectasis versus scarring. No interval change.   Original Report Authenticated By: Judie Petit. Miles Costain, M.D.   Ct Abdomen Pelvis W Contrast  01/05/2013   *RADIOLOGY REPORT*  Clinical Data: Diffuse abdominal pain.  Elevated  lipase.  Right flank pain.  Difficulty urinating.  Nausea, vomiting, constipation.  CT ABDOMEN AND PELVIS WITH CONTRAST  Technique:  Multidetector CT imaging of the abdomen and pelvis was performed following the standard protocol during bolus administration of intravenous contrast.  Contrast: OMNIPAQUE IOHEXOL 300 MG/ML  SOLN  Comparison: None.  Findings: Slight fibrosis in the right lung base.  There is minimal prominence of the head of the pancreas with minimal infiltration around the head of the pancreas.  This could represent early changes of mild acute pancreatitis.  The pancreatic duct is not dilated in the remainder the pancreas is unremarkable. No peripancreatic fluid collections.  There is a left adrenal gland nodule measuring 2.1 cm diameter and right adrenal gland nodule measuring 1.5 x 3.9 cm.  Hounsfield unit measurements are indeterminate on contrast enhanced scan.  Consider further characterization with noncontrast CT or MRI.  Umbilical hernia and supra umbilical hernia containing fat.  The supra umbilical hernia contains some fat stranding which may represent focal fat necrosis.  The liver, spleen, gallbladder, inferior vena cava, and retroperitoneal lymph nodes are unremarkable.  Sub centimeter lesions in the kidneys consistent with cysts.  The nephrograms are somewhat heterogeneous bilaterally.  This may be due to early imaging on the delayed phase but pyelonephritis is not excluded. The no hydronephrosis in either kidney.  The stomach and small bowel are decompressed.  Stool filled colon without distension.  No free air or free fluid in the abdomen.  Pelvis:  The uterus appears to be surgically absent.  The bladder wall is not thickened.  No free or loculated pelvic fluid collections.  Stool filled rectosigmoid colon without diverticulitis.  The appendix is not identified.  No significant pelvic lymphadenopathy.  Normal alignment of the lumbar vertebra with mild degenerative change.   IMPRESSION: Possible minimal inflammatory changes around the head of the pancreas could represent early acute focal pancreatitis. Heterogeneous renal parenchymal nephrograms bilaterally may represent pyelonephritis due to artifact of image timing. Umbilical line supraumbilical abdominal wall hernias containing fat.  Suggestion of fat necrosis in the supraumbilical hernia.   Original Report Authenticated By: Burman Nieves, M.D.   Dg Abd Acute W/chest  01/04/2013   *RADIOLOGY REPORT*  Clinical Data: Abdominal pain.  Vomiting.  Constipation.  Last bowel movement 3 weeks ago.  ACUTE ABDOMEN SERIES (ABDOMEN 2 VIEW & CHEST 1 VIEW)  Comparison: Chest 12/24/2012  Findings: Borderline heart size with mild pulmonary vascular congestion.  Mild interstitial changes in the lungs with hazy perihilar opacities may represent edema or pneumonitis.  Changes similar to previous study.  Linear atelectasis or infiltration in the lung bases.  No blunting of costophrenic angles.  No pneumothorax.  Calcification of the aorta.  Abdominal images demonstrate scattered gas and stool throughout the colon.  No small or large bowel distension.  No free intra- abdominal air.  No abnormal air fluid levels.  No radiopaque stones.  Surgical clips in the pelvis.  IMPRESSION: Pulmonary vascular congestion with diffuse interstitial changes and mild perihilar infiltration in the lungs.  This could represent pneumonia or edema.  Similar appearance to previous study. Nonobstructive bowel gas pattern with stool filled colon.   Original Report Authenticated By: Burman Nieves, M.D.   US Abdomen Limited Ruq  01/05/2013   *RADIOLOGY REPORT*  Clinical Data:  Abdominal pain.  COMPLETE ABDOMINAL ULTRASOUND  Comparison:  None.  Findings:  Gallbladder:  No gallstones, gallbladder wall thickening, or pericholecystic fluid.  Common bile duct:  Normal caliber.  Diameter measures 4.4 mm.  Liver:  No focal lesion identified.  Within normal limits in parenchymal  echogenicity.  IVC:  Appears normal.  Pancreas:  Limited visualization of the pancreas due to overlying bowel gas.  No focal lesion is identified.  Pancreatic duct is not dilated.  Spleen:  Spleen length measures 8.9 cm.  Normal parenchymal echotexture.  Right Kidney:  Right kidney measures 13.9 cm length.  No hydronephrosis.  Left Kidney:  Left kidney measures 13.7 cm length.  No hydronephrosis.  Abdominal aorta:  No aneurysm identified.  IMPRESSION: Negative abdominal ultrasound.   Original Report Authenticated By: Burman Nieves, M.D.    Medications: . heparin  5,000 Units Subcutaneous Q8H  . insulin aspart  0-15 Units Subcutaneous Q4H  . nicotine  21 mg Transdermal Daily    Assessment/Plan 1. Pancreatitis  2. Umbilical hernia  3. Recurrent pneumonia  4. COPD with ongoing tobacco use  5. Hypertension  6. Diabetes, but pt denies  7. Constipation by hx and on CT  8. GERD with some reflux  9.Osteoarthritis  10. BMI of 34.6  Plan: Any pain the patient was having was likely due to the pancreatitis which appears to be improving. No emergent need for repair of hernia. Will sign off at this time.    LOS: 2 days    Marikay Alar 01/06/2013

## 2013-01-06 NOTE — Progress Notes (Signed)
[facility] PROGRESS NOTE  Sarah [name] [id number]:[id number] DOB: 1956/06/05 DOA: 01/04/2013 PCP: No PCP Per Patient  Assessment/Plan: #1 acute pancreatitis Questionable etiology. Patient with no recent alcohol abuse. Triglycerides are not elevated enough to be the etiology. CT scan which was done was negative for any gallstones or biliary dilatation. Clinical improvement. Continue hydration with IV fluids. Patient tolerating clear liquids. Advance to full liquids and follow. If patient tolerates full liquids and advance to regular diet. Continue pain management and supportive care. Follow.  #2 hypertension Will resume patient on Norvasc 10 mg daily. Follow.  #3 type 2 diabetes Hemoglobin A1c 7.3. CBGs have ranged from 116-128. Continue sliding scale insulin. On discharge patient will need to be started on metformin. Patient also need to followup with PCP as outpatient.  #4 abdominal pain Secondary to problem #1.  #5 abdominal wall hernias Patient has been seen by general surgery. Patient to followup as outpatient on a when necessary basis.  #6 COPD Stable.  #7 prophylaxis PPI for GI prophylaxis. SCDs for DVT prophylaxis.   Code Status: Full Family Communication: Updated patient and family at bedside. Disposition Plan: Home when medically stable in 1-2 days.   Consultants:  General surgery: Dr. Magnus Ivan 01/05/2013  Procedures:  CT abdomen and pelvis 01/05/2013  Antibiotics:  None  HPI/Subjective: Patient states abdominal pain improving. Patient tolerating clear liquid diet. No nausea, no emesis.  Objective: Filed Vitals:   01/05/13 1356 01/05/13 2126 01/06/13 0525 01/06/13 1450  BP: 143/65 144/79 154/88 156/69  Pulse: 61 61 60 75  Temp: 97.9 F (36.6 C) 98 F (36.7 C) 98.1 F (36.7 C) 99.1 F (37.3 C)  TempSrc: Oral Oral Oral Oral  Resp: 20 18 18 20   Height:      Weight:      SpO2: 98% 97% 98% 98%    Intake/Output Summary (Last 24 hours) at 01/06/13  1535 Last data filed at 01/06/13 1500  Gross per 24 hour  Intake 4992.5 ml  Output      1 ml  Net 4991.5 ml   Filed Weights   01/05/13 0313  Weight: 102.921 kg (226 lb 14.4 oz)    Exam:   General:  NAD  Cardiovascular: RRR  Respiratory: CTAB  Abdomen: Soft/ND/+BS/decreased epig TTP  Extremities: No c/c/e  Data Reviewed: Basic Metabolic Panel:  Recent Labs Lab 01/04/13 2052 01/05/13 1041 01/06/13 0608  NA 135 135 136  K 3.5 3.7 3.5  CL 98 102 104  CO2 27 23 25   GLUCOSE 203* 166* 127*  BUN 10 7 6   CREATININE 0.59 0.63 0.59  CALCIUM 9.7 9.1 9.0  MG  --  2.0  --    Liver Function Tests:  Recent Labs Lab 01/04/13 2052 01/05/13 1041 01/06/13 0608  AST 11 8 10   ALT 7 6 7   ALKPHOS 150* 132* 128*  BILITOT 0.5 0.6 0.7  PROT 7.7 7.1 6.9  ALBUMIN 3.5 3.4* 3.1*    Recent Labs Lab 01/04/13 2052 01/06/13 0608  LIPASE 522* 152*   No results found for this basename: AMMONIA,  in the last 168 hours CBC:  Recent Labs Lab 01/04/13 2052 01/05/13 1044 01/06/13 0608  WBC 5.8 4.5 4.1  NEUTROABS 2.8 1.8 1.7  HGB 14.5 12.8 12.3  HCT 41.1 38.4 35.5*  MCV 77.8* 78.9 78.5  PLT 244 232 201   Cardiac Enzymes: No results found for this basename: CKTOTAL, CKMB, CKMBINDEX, TROPONINI,  in the last 168 hours BNP (last 3 results)  Recent Labs  12/24/12 2152  PROBNP 28.6   CBG:  Recent Labs Lab 01/05/13 1952 01/05/13 2331 01/06/13 0436 01/06/13 0720 01/06/13 1251  GLUCAP 113* 116* 116* 126* 128*    No results found for this or any previous visit (from the past 240 hour(s)).   Studies: Dg Chest 2 View  01/05/2013   *RADIOLOGY REPORT*  Clinical Data: Hypertension, smoker, abdominal pain  CHEST - 2 VIEW  Comparison: 01/04/2013  Findings: Stable heart size and vascularity.  Lingula atelectasis versus scarring persist.  No superimposed pneumonia, edema or effusion.  No pneumothorax.  Trachea midline.  Exam is limited because of technique and body habitus.   IMPRESSION: Stable vascular congestion and lingula atelectasis versus scarring. No interval change.   Original Report Authenticated By: Judie Petit. Miles Costain, M.D.   Ct Abdomen Pelvis W Contrast  01/05/2013   *RADIOLOGY REPORT*  Clinical Data: Diffuse abdominal pain.  Elevated lipase.  Right flank pain.  Difficulty urinating.  Nausea, vomiting, constipation.  CT ABDOMEN AND PELVIS WITH CONTRAST  Technique:  Multidetector CT imaging of the abdomen and pelvis was performed following the standard protocol during bolus administration of intravenous contrast.  Contrast: OMNIPAQUE IOHEXOL 300 MG/ML  SOLN  Comparison: None.  Findings: Slight fibrosis in the right lung base.  There is minimal prominence of the head of the pancreas with minimal infiltration around the head of the pancreas.  This could represent early changes of mild acute pancreatitis.  The pancreatic duct is not dilated in the remainder the pancreas is unremarkable. No peripancreatic fluid collections.  There is a left adrenal gland nodule measuring 2.1 cm diameter and right adrenal gland nodule measuring 1.5 x 3.9 cm.  Hounsfield unit measurements are indeterminate on contrast enhanced scan.  Consider further characterization with noncontrast CT or MRI.  Umbilical hernia and supra umbilical hernia containing fat.  The supra umbilical hernia contains some fat stranding which may represent focal fat necrosis.  The liver, spleen, gallbladder, inferior vena cava, and retroperitoneal lymph nodes are unremarkable.  Sub centimeter lesions in the kidneys consistent with cysts.  The nephrograms are somewhat heterogeneous bilaterally.  This may be due to early imaging on the delayed phase but pyelonephritis is not excluded. The no hydronephrosis in either kidney.  The stomach and small bowel are decompressed.  Stool filled colon without distension.  No free air or free fluid in the abdomen.  Pelvis:  The uterus appears to be surgically absent.  The bladder wall is not  thickened.  No free or loculated pelvic fluid collections.  Stool filled rectosigmoid colon without diverticulitis.  The appendix is not identified.  No significant pelvic lymphadenopathy.  Normal alignment of the lumbar vertebra with mild degenerative change.  IMPRESSION: Possible minimal inflammatory changes around the head of the pancreas could represent early acute focal pancreatitis. Heterogeneous renal parenchymal nephrograms bilaterally may represent pyelonephritis due to artifact of image timing. Umbilical line supraumbilical abdominal wall hernias containing fat.  Suggestion of fat necrosis in the supraumbilical hernia.   Original Report Authenticated By: Burman Nieves, M.D.   Dg Abd Acute W/chest  01/04/2013   *RADIOLOGY REPORT*  Clinical Data: Abdominal pain.  Vomiting.  Constipation.  Last bowel movement 3 weeks ago.  ACUTE ABDOMEN SERIES (ABDOMEN 2 VIEW & CHEST 1 VIEW)  Comparison: Chest 12/24/2012  Findings: Borderline heart size with mild pulmonary vascular congestion.  Mild interstitial changes in the lungs with hazy perihilar opacities may represent edema or pneumonitis.  Changes similar to previous study.  Linear atelectasis or  infiltration in the lung bases.  No blunting of costophrenic angles.  No pneumothorax.  Calcification of the aorta.  Abdominal images demonstrate scattered gas and stool throughout the colon.  No small or large bowel distension.  No free intra- abdominal air.  No abnormal air fluid levels.  No radiopaque stones.  Surgical clips in the pelvis.  IMPRESSION: Pulmonary vascular congestion with diffuse interstitial changes and mild perihilar infiltration in the lungs.  This could represent pneumonia or edema.  Similar appearance to previous study. Nonobstructive bowel gas pattern with stool filled colon.   Original Report Authenticated By: Burman Nieves, M.D.   US Abdomen Limited Ruq  01/05/2013   *RADIOLOGY REPORT*  Clinical Data:  Abdominal pain.  COMPLETE ABDOMINAL  ULTRASOUND  Comparison:  None.  Findings:  Gallbladder:  No gallstones, gallbladder wall thickening, or pericholecystic fluid.  Common bile duct:  Normal caliber.  Diameter measures 4.4 mm.  Liver:  No focal lesion identified.  Within normal limits in parenchymal echogenicity.  IVC:  Appears normal.  Pancreas:  Limited visualization of the pancreas due to overlying bowel gas.  No focal lesion is identified.  Pancreatic duct is not dilated.  Spleen:  Spleen length measures 8.9 cm.  Normal parenchymal echotexture.  Right Kidney:  Right kidney measures 13.9 cm length.  No hydronephrosis.  Left Kidney:  Left kidney measures 13.7 cm length.  No hydronephrosis.  Abdominal aorta:  No aneurysm identified.  IMPRESSION: Negative abdominal ultrasound.   Original Report Authenticated By: Burman Nieves, M.D.    Scheduled Meds: . [START ON 01/07/2013] amLODipine  10 mg Oral Daily  . amLODipine  5 mg Oral Once  . insulin aspart  0-15 Units Subcutaneous Q4H  . nicotine  21 mg Transdermal Daily  . [START ON 01/07/2013] pantoprazole  40 mg Oral Daily   Continuous Infusions: . sodium chloride 0.9 % 1,000 mL with potassium chloride 40 mEq infusion 150 mL/hr at 01/06/13 1311    Principal Problem:   Pancreatitis, acute Active Problems:   TOBACCO ABUSE   CONSTIPATION NOS   HTN (hypertension)   Diabetes type 2, uncontrolled   Abdominal pain, unspecified site   Abdominal wall hernia    Time spent: > 35 mins    [facility]  [facility] Pager [phone]. If 7PM-7AM, please contact night-coverage at [email or web address], password [facility] 01/06/2013, 3:35 PM  LOS: 2 days

## 2013-01-06 NOTE — Progress Notes (Signed)
Pt L hand IV site swollen, Dr Janee Morn asked to have IV site changed. Attempted x1, IV team paged.  IV team attempted x1.  Pt refused further attempts.  Dr Janee Morn notified.  He asked we continue to use L hand IV.  IVF restarted, pt complained of severe pain at IV site, IV removed.  Pt agreeable to another IV start attempt.  IV team notified.  Pt does not have IV access at this time.  Will continue to monitor.

## 2013-01-07 DIAGNOSIS — K439 Ventral hernia without obstruction or gangrene: Secondary | ICD-10-CM

## 2013-01-07 LAB — CBC WITH DIFFERENTIAL/PLATELET
Basophils Relative: 0 % (ref 0–1)
Eosinophils Absolute: 0.1 10*3/uL (ref 0.0–0.7)
Eosinophils Relative: 2 % (ref 0–5)
HCT: 37.2 % (ref 36.0–46.0)
Hemoglobin: 12.6 g/dL (ref 12.0–15.0)
Lymphs Abs: 2.7 10*3/uL (ref 0.7–4.0)
MCH: 26.5 pg (ref 26.0–34.0)
MCHC: 33.9 g/dL (ref 30.0–36.0)
MCV: 78.3 fL (ref 78.0–100.0)
Monocytes Absolute: 0.4 10*3/uL (ref 0.1–1.0)
Monocytes Relative: 7 % (ref 3–12)
Neutrophils Relative %: 37 % — ABNORMAL LOW (ref 43–77)

## 2013-01-07 LAB — GLUCOSE, CAPILLARY
Glucose-Capillary: 149 mg/dL — ABNORMAL HIGH (ref 70–99)
Glucose-Capillary: 224 mg/dL — ABNORMAL HIGH (ref 70–99)

## 2013-01-07 LAB — COMPREHENSIVE METABOLIC PANEL
Albumin: 3.5 g/dL (ref 3.5–5.2)
BUN: 6 mg/dL (ref 6–23)
Calcium: 9.5 mg/dL (ref 8.4–10.5)
Creatinine, Ser: 0.69 mg/dL (ref 0.50–1.10)
GFR calc Af Amer: >90 mL/min (ref >90)
Glucose, Bld: 142 mg/dL — ABNORMAL HIGH (ref 70–99)
Total Protein: 7.2 g/dL (ref 6.0–8.3)

## 2013-01-07 LAB — LIPASE, BLOOD: Lipase: 105 U/L — ABNORMAL HIGH (ref 11–59)

## 2013-01-07 MED ORDER — INSULIN ASPART 100 UNIT/ML ~~LOC~~ SOLN
0.0000 [IU] | Freq: Three times a day (TID) | SUBCUTANEOUS | Status: DC
  Administered 2013-01-07 (×2): 3 [IU] via SUBCUTANEOUS
  Administered 2013-01-08: 5 [IU] via SUBCUTANEOUS
  Administered 2013-01-08: 3 [IU] via SUBCUTANEOUS

## 2013-01-07 MED ORDER — ONDANSETRON HCL 4 MG PO TABS: 4.0000 mg | ORAL_TABLET | Freq: Four times a day (QID) | ORAL | Status: DC | PRN

## 2013-01-07 MED ORDER — HYDROCODONE-ACETAMINOPHEN 5-500 MG PO TABS: 1.0000 | ORAL_TABLET | Freq: Three times a day (TID) | ORAL | Status: DC | PRN

## 2013-01-07 MED ORDER — INSULIN ASPART 100 UNIT/ML ~~LOC~~ SOLN
3.0000 [IU] | Freq: Once | SUBCUTANEOUS | Status: AC
  Administered 2013-01-07: 3 [IU] via SUBCUTANEOUS

## 2013-01-07 MED ORDER — ONDANSETRON HCL 4 MG/2ML IJ SOLN
4.0000 mg | Freq: Four times a day (QID) | INTRAMUSCULAR | Status: DC | PRN
  Administered 2013-01-07: 4 mg via INTRAVENOUS
  Filled 2013-01-07: qty 2

## 2013-01-07 MED ORDER — GLIMEPIRIDE 1 MG PO TABS: 1.0000 mg | ORAL_TABLET | Freq: Every day | ORAL | Status: DC

## 2013-01-07 NOTE — Discharge Summary (Signed)
Physician Discharge Summary  Sarah [name] WJX:[id number] DOB: August 17, 1955 DOA: 01/04/2013  PCP: No PCP Per Patient  Admit date: 01/04/2013 Discharge date: 01/08/13  Time spent: greater than 30 minutes  Discharge Diagnoses:  Principal Problem:   Pancreatitis, acute Active Problems:   TOBACCO ABUSE   HTN (hypertension)   Diabetes type 2, uncontrolled, new diagnosis   Abdominal pain, unspecified site   Abdominal wall hernia  Discharge Condition: stable  Filed Weights   01/05/13 0313 01/07/13 0444  Weight: 102.921 kg (226 lb 14.4 oz) 104.6 kg (230 lb 9.6 oz)    History of present illness:  History of Present Illness:This is a 57 y.o. year old female with significant past medical history of DM, COPD, HTN presenting with abdominal pain and elevated lipase. Pt states that she has had diffuse abdominal pain over the last 4-5 days. Pain is generalized with some radiation to back. Also with nausea and dry heaving. No fevers of chills. Pt was seen last week for CP/SOB. Was diagnosed with CAP. Placed on levaquin. Pt states that abd pain started when medication was started. Pt denies any ETOH use. Does smoke 1PPD. No prior hx/o GI issues per pt. No diarrhea.  In ER, bloodwork obtained showing lipase of 555. Abd [place] also obtained that was WNL.   Hospital Course:  Admitted to hospitalist service.  Surgery consulted for abnormal CT showing fat necrosis in supraumbilical hernia.  They felt this was chronic, and patient could follow up with them as outpatient if she wanted corrective surgery.  Started on clears, IVF, pain and nausea medications.  Denies drinking.  Could be idiopathic vs. Medication related.  Pain improved. Diet advanced.    Also diagnosed with diabetes this hospitalization.  Given diabetic teaching and started on amaryl.  Will need outpatient monitoring/teaching.  Discharged home in stable condition.  Procedures:  none  Consultations:  surgery  Discharge Exam: Filed Vitals:    01/06/13 1450 01/06/13 2101 01/07/13 0437 01/07/13 0444  BP: 156/69 150/72 161/68   Pulse: 75 82 80   Temp: 99.1 F (37.3 C) 98.5 F (36.9 C) 97.5 F (36.4 C)   TempSrc: Oral Oral Oral   Resp: 20 18 18    Height:      Weight:    104.6 kg (230 lb 9.6 oz)  SpO2: 98% 92% 96%     General: talkative. comfortable Abd: soft, nontender, nondistended  Discharge Instructions  Discharge Orders   Future Orders Complete By Expires     Activity as tolerated - No restrictions  As directed     Discharge instructions  As directed     Comments:      Low fat, no concentrated sweets        Medication List    STOP taking these medications       naproxen 500 MG tablet  Commonly known as:  NAPROSYN     ranitidine 150 MG tablet  Commonly known as:  ZANTAC      TAKE these medications       albuterol 108 (90 BASE) MCG/ACT inhaler  Commonly known as:  PROVENTIL HFA;VENTOLIN HFA  Inhale 2 puffs into the lungs every 6 (six) hours as needed for wheezing.     albuterol (2.5 MG/3ML) 0.083% nebulizer solution  Commonly known as:  PROVENTIL  Take 2.5 mg by nebulization every 6 (six) hours as needed for wheezing.     amLODipine 10 MG tablet  Commonly known as:  NORVASC  Take 10 mg by mouth  daily.     budesonide 0.5 MG/2ML nebulizer solution  Commonly known as:  PULMICORT  Take 0.5 mg by nebulization 2 (two) times daily as needed (for shortness of breath).     glimepiride 1 MG tablet  Commonly known as:  AMARYL  Take 1 tablet (1 mg total) by mouth daily before breakfast.     hydrochlorothiazide 50 MG tablet  Commonly known as:  HYDRODIURIL  Take 50 mg by mouth daily.     HYDROcodone-acetaminophen 5-500 MG per tablet  Commonly known as:  VICODIN  Take 1 tablet by mouth every 8 (eight) hours as needed for pain.     lansoprazole 30 MG capsule  Commonly known as:  PREVACID  Take 30 mg by mouth daily.     ondansetron 4 MG tablet  Commonly known as:  ZOFRAN  Take 1 tablet (4 mg total)  by mouth every 6 (six) hours as needed for nausea.     REFRESH OPTIVE ADVANCED 0.5-1-0.5 % Soln  Generic drug:  Carboxymeth-Glycerin-Polysorb  Place 1 drop into both eyes 2 (two) times daily as needed (for dry eyes).     traMADol 50 MG tablet  Commonly known as:  ULTRAM  Take 50 mg by mouth every 6 (six) hours as needed for pain.       Allergies  Allergen Reactions  . Ibuprofen Other (See Comments)    unknown  . Penicillins Other (See Comments)    unknown       Follow-up Information   Follow up with [facility]     In 2 weeks.   Contact information:   [address] [place] [address]        The results of significant diagnostics from this hospitalization (including imaging, microbiology, ancillary and laboratory) are listed below for reference.    Significant Diagnostic Studies: Dg Chest 2 View  01/05/2013   *RADIOLOGY REPORT*  Clinical Data: Hypertension, smoker, abdominal pain  CHEST - 2 VIEW  Comparison: 01/04/2013  Findings: Stable heart size and vascularity.  Lingula atelectasis versus scarring persist.  No superimposed pneumonia, edema or effusion.  No pneumothorax.  Trachea midline.  Exam is limited because of technique and body habitus.  IMPRESSION: Stable vascular congestion and lingula atelectasis versus scarring. No interval change.   Original Report Authenticated By: Judie Petit. Shick, M.D.   Dg Chest 2 View  12/24/2012   *RADIOLOGY REPORT*  Clinical Data: Chest pain extending into left arm, shortness of breath, cough, smoker, history asthma, hypertension, diabetes  CHEST - 2 VIEW  Comparison: 07/01/2012  Findings: Enlargement of cardiac silhouette with pulmonary vascular congestion. Chronic atelectasis at lingula. Hazy infiltrates are identified at the mid-to-lower lungs bilaterally question edema versus infection. No gross pleural effusion or pneumothorax. Bones appear demineralized.  IMPRESSION: Enlargement of cardiac silhouette with  pulmonary vascular congestion. Hazy infiltrates mid-to-lower lungs question mild edema versus infection. Minimal lingular atelectasis.   Original Report Authenticated By: Ulyses Southward, M.D.   Ct Abdomen Pelvis W Contrast  01/05/2013   *RADIOLOGY REPORT*  Clinical Data: Diffuse abdominal pain.  Elevated lipase.  Right flank pain.  Difficulty urinating.  Nausea, vomiting, constipation.  CT ABDOMEN AND PELVIS WITH CONTRAST  Technique:  Multidetector CT imaging of the abdomen and pelvis was performed following the standard protocol during bolus administration of intravenous contrast.  Contrast: OMNIPAQUE IOHEXOL 300 MG/ML  SOLN  Comparison: None.  Findings: Slight fibrosis in the right lung base.  There is minimal prominence of  the head of the pancreas with minimal infiltration around the head of the pancreas.  This could represent early changes of mild acute pancreatitis.  The pancreatic duct is not dilated in the remainder the pancreas is unremarkable. No peripancreatic fluid collections.  There is a left adrenal gland nodule measuring 2.1 cm diameter and right adrenal gland nodule measuring 1.5 x 3.9 cm.  Hounsfield unit measurements are indeterminate on contrast enhanced scan.  Consider further characterization with noncontrast CT or MRI.  Umbilical hernia and supra umbilical hernia containing fat.  The supra umbilical hernia contains some fat stranding which may represent focal fat necrosis.  The liver, spleen, gallbladder, inferior vena cava, and retroperitoneal lymph nodes are unremarkable.  Sub centimeter lesions in the kidneys consistent with cysts.  The nephrograms are somewhat heterogeneous bilaterally.  This may be due to early imaging on the delayed phase but pyelonephritis is not excluded. The no hydronephrosis in either kidney.  The stomach and small bowel are decompressed.  Stool filled colon without distension.  No free air or free fluid in the abdomen.  Pelvis:  The uterus appears to be  surgically absent.  The bladder wall is not thickened.  No free or loculated pelvic fluid collections.  Stool filled rectosigmoid colon without diverticulitis.  The appendix is not identified.  No significant pelvic lymphadenopathy.  Normal alignment of the lumbar vertebra with mild degenerative change.  IMPRESSION: Possible minimal inflammatory changes around the head of the pancreas could represent early acute focal pancreatitis. Heterogeneous renal parenchymal nephrograms bilaterally may represent pyelonephritis due to artifact of image timing. Umbilical line supraumbilical abdominal wall hernias containing fat.  Suggestion of fat necrosis in the supraumbilical hernia.   Original Report Authenticated By: Burman Nieves, M.D.   Dg Abd Acute W/chest  01/04/2013   *RADIOLOGY REPORT*  Clinical Data: Abdominal pain.  Vomiting.  Constipation.  Last bowel movement 3 weeks ago.  ACUTE ABDOMEN SERIES (ABDOMEN 2 VIEW & CHEST 1 VIEW)  Comparison: Chest 12/24/2012  Findings: Borderline heart size with mild pulmonary vascular congestion.  Mild interstitial changes in the lungs with hazy perihilar opacities may represent edema or pneumonitis.  Changes similar to previous study.  Linear atelectasis or infiltration in the lung bases.  No blunting of costophrenic angles.  No pneumothorax.  Calcification of the aorta.  Abdominal images demonstrate scattered gas and stool throughout the colon.  No small or large bowel distension.  No free intra- abdominal air.  No abnormal air fluid levels.  No radiopaque stones.  Surgical clips in the pelvis.  IMPRESSION: Pulmonary vascular congestion with diffuse interstitial changes and mild perihilar infiltration in the lungs.  This could represent pneumonia or edema.  Similar appearance to previous study. Nonobstructive bowel gas pattern with stool filled colon.   Original Report Authenticated By: Burman Nieves, M.D.   US Abdomen Limited Ruq  01/05/2013   *RADIOLOGY REPORT*  Clinical  Data:  Abdominal pain.  COMPLETE ABDOMINAL ULTRASOUND  Comparison:  None.  Findings:  Gallbladder:  No gallstones, gallbladder wall thickening, or pericholecystic fluid.  Common bile duct:  Normal caliber.  Diameter measures 4.4 mm.  Liver:  No focal lesion identified.  Within normal limits in parenchymal echogenicity.  IVC:  Appears normal.  Pancreas:  Limited visualization of the pancreas due to overlying bowel gas.  No focal lesion is identified.  Pancreatic duct is not dilated.  Spleen:  Spleen length measures 8.9 cm.  Normal parenchymal echotexture.  Right Kidney:  Right kidney measures 13.9 cm length.  No hydronephrosis.  Left Kidney:  Left kidney measures 13.7 cm length.  No hydronephrosis.  Abdominal aorta:  No aneurysm identified.  IMPRESSION: Negative abdominal ultrasound.   Original Report Authenticated By: Burman Nieves, M.D.    Microbiology: No results found for this or any previous visit (from the past 240 hour(s)).   Labs: Basic Metabolic Panel:  Recent Labs Lab 01/04/13 2052 01/05/13 1041 01/06/13 0608 01/07/13 0435  NA 135 135 136 139  K 3.5 3.7 3.5 3.8  CL 98 102 104 104  CO2 27 23 25 25   GLUCOSE 203* 166* 127* 142*  BUN 10 7 6 6   CREATININE 0.59 0.63 0.59 0.69  CALCIUM 9.7 9.1 9.0 9.5  MG  --  2.0  --   --    Liver Function Tests:  Recent Labs Lab 01/04/13 2052 01/05/13 1041 01/06/13 0608 01/07/13 0435  AST 11 8 10 12   ALT 7 6 7 9   ALKPHOS 150* 132* 128* 135*  BILITOT 0.5 0.6 0.7 0.3  PROT 7.7 7.1 6.9 7.2  ALBUMIN 3.5 3.4* 3.1* 3.5    Recent Labs Lab 01/04/13 2052 01/06/13 0608 01/07/13 0435  LIPASE 522* 152* 105*   No results found for this basename: AMMONIA,  in the last 168 hours CBC:  Recent Labs Lab 01/04/13 2052 01/05/13 1044 01/06/13 0608 01/07/13 0435  WBC 5.8 4.5 4.1 5.0  NEUTROABS 2.8 1.8 1.7 1.9  HGB 14.5 12.8 12.3 12.6  HCT 41.1 38.4 35.5* 37.2  MCV 77.8* 78.9 78.5 78.3  PLT 244 232 201 226   Cardiac Enzymes: No results  found for this basename: CKTOTAL, CKMB, CKMBINDEX, TROPONINI,  in the last 168 hours BNP: BNP (last 3 results)  Recent Labs  12/24/12 2152  PROBNP 28.6   CBG:  Recent Labs Lab 01/06/13 1955 01/07/13 0043 01/07/13 0435 01/07/13 0814 01/07/13 1139  GLUCAP 199* 175* 149* 156* 178*    Signed:  Cartez Mogle L  [facility] 01/07/2013, 12:30 PM

## 2013-01-08 LAB — LIPASE, BLOOD: Lipase: 121 U/L — ABNORMAL HIGH (ref 11–59)

## 2013-01-08 LAB — GLUCOSE, CAPILLARY

## 2013-01-08 NOTE — Progress Notes (Signed)
Pt discharged to home

## 2013-01-08 NOTE — Progress Notes (Signed)
[facility] PROGRESS NOTE  Sarah [name] ZOX:[id number] DOB: [birth year] DOA: 01/04/2013 PCP: No PCP Per Patient  Assessment/Plan: #1 acute pancreatitis Advance diet and monitor  #2 hypertension Will resume patient on Norvasc 10 mg daily.  #3 type 2 diabetes Hemoglobin A1c 7.3. CBGs have ranged from 116-128. Continue sliding scale insulin. On discharge patient will need to be started on metformin. Patient also need to followup with PCP as outpatient.  #4 abdominal pain Secondary to problem #1.  #5 abdominal wall hernias Patient has been seen by general surgery. Patient to followup as outpatient on a when necessary basis.  #6 COPD Stable.  #7 prophylaxis PPI for GI prophylaxis. SCDs for DVT prophylaxis.   Code Status: Full Family Communication: Updated patient and family at bedside. Disposition Plan: Home when medically stable in 1-2 days.   Consultants:  General surgery: Dr. Magnus Ivan 01/05/2013  Procedures:  CT abdomen and pelvis 01/05/2013  Antibiotics:  None  HPI/Subjective: Patient states abdominal pain improving. Patient tolerating clear liquid diet. No nausea, no emesis.  Objective: Filed Vitals:   01/07/13 0437 01/07/13 0444 01/07/13 1338 01/07/13 2205  BP: 161/68  154/64 154/90  Pulse: 80  82 75  Temp: 97.5 F (36.4 C)  98 F (36.7 C) 98 F (36.7 C)  TempSrc: Oral  Oral Oral  Resp: 18  16 18   Height:      Weight:  104.6 kg (230 lb 9.6 oz) 105 kg (231 lb 7.7 oz)   SpO2: 96%  97% 98%    Intake/Output Summary (Last 24 hours) at 01/08/13 0020 Last data filed at 01/07/13 1844  Gross per 24 hour  Intake   3630 ml  Output      0 ml  Net   3630 ml   Filed Weights   01/05/13 0313 01/07/13 0444 01/07/13 1338  Weight: 102.921 kg (226 lb 14.4 oz) 104.6 kg (230 lb 9.6 oz) 105 kg (231 lb 7.7 oz)    Exam:   General:  NAD  Cardiovascular: RRR  Respiratory: CTAB  Abdomen: Soft/ND/+BS/decreased epig TTP  Extremities: No c/c/e  Data  Reviewed: Basic Metabolic Panel:  Recent Labs Lab 01/04/13 2052 01/05/13 1041 01/06/13 0608 01/07/13 0435  NA 135 135 136 139  K 3.5 3.7 3.5 3.8  CL 98 102 104 104  CO2 27 23 25 25   GLUCOSE 203* 166* 127* 142*  BUN 10 7 6 6   CREATININE 0.59 0.63 0.59 0.69  CALCIUM 9.7 9.1 9.0 9.5  MG  --  2.0  --   --    Liver Function Tests:  Recent Labs Lab 01/04/13 2052 01/05/13 1041 01/06/13 0608 01/07/13 0435  AST 11 8 10 12   ALT 7 6 7 9   ALKPHOS 150* 132* 128* 135*  BILITOT 0.5 0.6 0.7 0.3  PROT 7.7 7.1 6.9 7.2  ALBUMIN 3.5 3.4* 3.1* 3.5    Recent Labs Lab 01/04/13 2052 01/06/13 0608 01/07/13 0435  LIPASE 522* 152* 105*   No results found for this basename: AMMONIA,  in the last 168 hours CBC:  Recent Labs Lab 01/04/13 2052 01/05/13 1044 01/06/13 0608 01/07/13 0435  WBC 5.8 4.5 4.1 5.0  NEUTROABS 2.8 1.8 1.7 1.9  HGB 14.5 12.8 12.3 12.6  HCT 41.1 38.4 35.5* 37.2  MCV 77.8* 78.9 78.5 78.3  PLT 244 232 201 226   Cardiac Enzymes: No results found for this basename: CKTOTAL, CKMB, CKMBINDEX, TROPONINI,  in the last 168 hours BNP (last 3 results)  Recent Labs  12/24/12  2152  PROBNP 28.6   CBG:  Recent Labs Lab 01/07/13 0814 01/07/13 1139 01/07/13 1654 01/07/13 2017 01/07/13 2204  GLUCAP 156* 178* 199* 166* 224*    No results found for this or any previous visit (from the past 240 hour(s)).   Studies: No results found.  Scheduled Meds: . amLODipine  10 mg Oral Daily  . insulin aspart  0-15 Units Subcutaneous TID WC  . nicotine  21 mg Transdermal Daily  . pantoprazole  40 mg Oral Daily   Continuous Infusions: . sodium chloride 0.9 % 1,000 mL with potassium chloride 40 mEq infusion 150 mL/hr at 01/07/13 1610    Principal Problem:   Pancreatitis, acute Active Problems:   TOBACCO ABUSE   HTN (hypertension)   Diabetes type 2, uncontrolled   Abdominal pain, unspecified site   Abdominal wall hernia  Time spent: > 25  mins  Sarah [name] L  [facility] Pager [phone]. If 7PM-7AM, please contact night-coverage at [email or web address], password [facility] 01/08/2013, 12:20 AM  LOS: 4 days

## 2013-01-29 ENCOUNTER — Encounter: Payer: Medicare Other | Attending: Internal Medicine

## 2013-01-29 VITALS — Ht 68.0 in | Wt 228.5 lb

## 2013-01-29 DIAGNOSIS — E1165 Type 2 diabetes mellitus with hyperglycemia: Secondary | ICD-10-CM

## 2013-01-29 DIAGNOSIS — Z713 Dietary counseling and surveillance: Secondary | ICD-10-CM | POA: Insufficient documentation

## 2013-01-29 DIAGNOSIS — E119 Type 2 diabetes mellitus without complications: Secondary | ICD-10-CM | POA: Insufficient documentation

## 2013-02-05 NOTE — Progress Notes (Signed)
Patient was seen on 01/29/13 for the first of a series of three diabetes self-management courses at the [facility].   Current HbA1c: 7.3% on 01/05/13  The following learning objectives were met by the patient during this course:   Defines the role of glucose and insulin  Identifies type of diabetes and pathophysiology  Defines the diagnostic criteria for diabetes and prediabetes  States the risk factors for Type 2 Diabetes  States the symptoms of Type 2 Diabetes  Defines Type 2 Diabetes treatment goals  Defines Type 2 Diabetes treatment options  States the rationale for glucose monitoring  Identifies A1C, glucose targets, and testing times  Identifies proper sharps disposal  Defines the purpose of a diabetes food plan  Identifies carbohydrate food groups  Defines effects of carbohydrate foods on glucose levels  Identifies carbohydrate choices/grams/food labels  States benefits of physical activity and effect on glucose  Review of suggested activity guidelines  Handouts given during class include:  Type 2 Diabetes: Basics Book  My Food Plan Book  Food and Activity Log  Your patient has identified their diabetes self-care support plan as:  [facility] support group  Continued diabetes education  Follow-Up Plan: Attend core 2 and core 3

## 2013-02-05 NOTE — Patient Instructions (Signed)
Goals:  Follow Diabetes Meal Plan as instructed  Eat 3 meals and 2 snacks, every 3-5 hrs  Limit carbohydrate intake to 45 grams carbohydrate/meal  Limit carbohydrate intake to 0-30 grams carbohydrate/snack  Add lean protein foods to meals/snacks  Monitor glucose levels as instructed by your doctor  Aim for 15 mins of physical activity daily  Bring food record and glucose log to your next nutrition visit

## 2013-02-17 ENCOUNTER — Encounter: Payer: Medicare Other | Attending: Internal Medicine

## 2013-02-17 DIAGNOSIS — Z713 Dietary counseling and surveillance: Secondary | ICD-10-CM | POA: Insufficient documentation

## 2013-02-17 DIAGNOSIS — E119 Type 2 diabetes mellitus without complications: Secondary | ICD-10-CM | POA: Insufficient documentation

## 2013-02-19 ENCOUNTER — Encounter: Payer: Self-pay | Admitting: Internal Medicine

## 2013-02-19 ENCOUNTER — Ambulatory Visit: Payer: Medicare Other | Attending: Internal Medicine | Admitting: Internal Medicine

## 2013-02-19 VITALS — BP 161/89 | HR 82 | Temp 98.7°F | Resp 16 | Wt 229.0 lb

## 2013-02-19 DIAGNOSIS — K859 Acute pancreatitis without necrosis or infection, unspecified: Secondary | ICD-10-CM | POA: Insufficient documentation

## 2013-02-19 DIAGNOSIS — E1310 Other specified diabetes mellitus with ketoacidosis without coma: Secondary | ICD-10-CM

## 2013-02-19 DIAGNOSIS — E1110 Type 2 diabetes mellitus with ketoacidosis without coma: Secondary | ICD-10-CM

## 2013-02-19 DIAGNOSIS — E119 Type 2 diabetes mellitus without complications: Secondary | ICD-10-CM | POA: Insufficient documentation

## 2013-02-19 DIAGNOSIS — I10 Essential (primary) hypertension: Secondary | ICD-10-CM

## 2013-02-19 MED ORDER — GLIMEPIRIDE 1 MG PO TABS: 1.0000 mg | ORAL_TABLET | Freq: Every day | ORAL | Status: DC

## 2013-02-19 MED ORDER — HYDROCHLOROTHIAZIDE 50 MG PO TABS: 50.0000 mg | ORAL_TABLET | Freq: Every day | ORAL | Status: DC

## 2013-02-19 MED ORDER — AMLODIPINE BESYLATE 10 MG PO TABS: 10.0000 mg | ORAL_TABLET | Freq: Every day | ORAL | Status: DC

## 2013-02-19 NOTE — Progress Notes (Signed)
Patient here to establish care History of DM Needs script for glucomter

## 2013-02-19 NOTE — Progress Notes (Signed)
Patient ID: Sarah [name], female   DOB: April 28, 1956, 57 y.o.   MRN: [id number]   HPI 57 year old obese female who was admitted about 6 weeks back to [facility] with acute pancreatitis of unclear etiology, abdominal wall hernia and new onset diabetes. Discharged on amaryl and scheduled for outpatient followup. Patient denies any fever, chills, headache, nausea, vomiting, abdominal pain, chest pain, palpitations, shortness of breath, polyuria, polydipsia, bowel or urinary symptoms. She denies being compliant with her diet. She has not checked her blood glucose that she does not have a glucometer.  Review of systems As outlined in history of presenting illness  Vital signs in last 24 hours:  Filed Vitals:   02/19/13 1740  BP: 161/89  Pulse: 82  Temp: 98.7 F (37.1 C)  Resp: 16  Weight: 229 lb (103.874 kg)  SpO2: 96%      Physical Exam:  General: Middle aged female in no acute distress. HEENT: no pallor, no icterus, moist oral mucosa, no JVD, no lymphadenopathy Heart: Normal  s1 &s2  Regular rate and rhythm,  Lungs: Clear to auscultation bilaterally. Abdomen: Soft, nontender, nondistended, abdominal wall hernia which is easily reducible , positive bowel sounds. Extremities: No clubbing cyanosis or edema  Neuro: Alert, awake, oriented x3, nonfocal.   Lab Results:  Basic Metabolic Panel:    Component Value Date/Time   NA 139 01/07/2013 0435   K 3.8 01/07/2013 0435   CL 104 01/07/2013 0435   CO2 25 01/07/2013 0435   BUN 6 01/07/2013 0435   CREATININE 0.69 01/07/2013 0435   GLUCOSE 142* 01/07/2013 0435   CALCIUM 9.5 01/07/2013 0435   CBC:    Component Value Date/Time   WBC 5.0 01/07/2013 0435   HGB 12.6 01/07/2013 0435   HCT 37.2 01/07/2013 0435   PLT 226 01/07/2013 0435   MCV 78.3 01/07/2013 0435   NEUTROABS 1.9 01/07/2013 0435   LYMPHSABS 2.7 01/07/2013 0435   MONOABS 0.4 01/07/2013 0435   EOSABS 0.1 01/07/2013 0435   BASOSABS 0.0 01/07/2013 0435    No results found for this or  any previous visit (from the past 240 hour(s)).  Studies/Results: No results found.  Medications: Scheduled Meds: Continuous Infusions: PRN Meds:.    Assessment/Plan: Pancreatitis No clear etiology. Symptoms not resolved.  diabetes mellitus 1 A1c of 7.3. Newly diagnosed. Currently on Tamaryl  which I would continue. Glucometer prescribed. Counseled on diet and exercise  Hypertension Pressure mildly elevated. counseled on salt restricted diet and exercise. Continue meds  Followup in 2 months  Samiha Denapoli 02/19/2013, 6:09 PM

## 2013-03-25 ENCOUNTER — Encounter: Payer: Medicare Other | Attending: Internal Medicine

## 2013-03-25 DIAGNOSIS — E119 Type 2 diabetes mellitus without complications: Secondary | ICD-10-CM | POA: Insufficient documentation

## 2013-03-25 DIAGNOSIS — E1165 Type 2 diabetes mellitus with hyperglycemia: Secondary | ICD-10-CM

## 2013-03-25 DIAGNOSIS — Z713 Dietary counseling and surveillance: Secondary | ICD-10-CM | POA: Insufficient documentation

## 2013-03-25 NOTE — Progress Notes (Signed)
Patient was seen on 03/25/13 for the third of a series of three diabetes self-management courses at the [facility]. The following learning objectives were met by the patient during this course:    Describe how diabetes changes over time   Identify diabetes complications and ways to prevent them   Describe strategies that can promote heart health including lowering blood pressure and cholesterol   Describe strategies to lower dietary fat and sodium in the diet   Identify physical activities that benefit cardiovascular health   Describe role of stress on blood glucose and develop strategies to address psychosocial issues   Evaluate success in meeting personal goal   Describe the belief that they can live successfully with diabetes day to day   Establish 2-3 goals that they will plan to diligently work on until they return for the free 80-month follow-up visit  The following handouts were given in class:  Goal setting handout  Class evaluation form  Low-sodium seasoning tips  Stress management handout  Your patient has established the following 4 month goals for diabetes self-care:  Stop smoking by December 2014  Test glucose BID  The patient received the following glucometer and received BGM instruction: Accu Chek Nano BG Monitoring Kit Lot#: [id number] Exp: 05/17/14  Your patient has identified these potential barriers to change:  Health literacy and access to care  Your patient has identified their diabetes self-care support plan as:  [facility] support group  family  Follow-Up Plan: Patient was offered a 4 month follow-up visit for diabetes self-management education.

## 2013-04-08 ENCOUNTER — Encounter: Payer: Medicare Other | Admitting: Dietician

## 2013-04-08 DIAGNOSIS — E1165 Type 2 diabetes mellitus with hyperglycemia: Secondary | ICD-10-CM

## 2013-04-08 NOTE — Progress Notes (Signed)
Patient was seen on 04/08/13 for the second of a series of three diabetes self-management courses at the [facility]. The following learning objectives were met by the patient during this class:   Describe the role of different macronutrients on glucose  Explain how carbohydrates affect blood glucose  State what foods contain the most carbohydrates  Demonstrate carbohydrate counting  Demonstrate how to read Nutrition Facts food label  Describe effects of various fats on heart health  Describe the importance of good nutrition for health and healthy eating strategies  Describe techniques for managing your shopping, cooking and meal planning  List strategies to follow meal plan when dining out  Describe the effects of alcohol on glucose and how to use it safely  Follow-Up Plan:  Attend Core 3  Work towards following your personal food plan.  

## 2013-04-08 NOTE — Patient Instructions (Addendum)
Goals:  Follow Diabetes Meal Plan as instructed  Eat 3 meals and 2 snacks, every 3-5 hrs  Limit carbohydrate intake to 30-45 grams carbohydrate/meal  Limit carbohydrate intake to 15 grams carbohydrate/snack  Add lean protein foods to meals/snacks  Monitor glucose levels as instructed by your doctor  Aim for 30 mins of physical activity daily  Bring food record and glucose log to your next nutrition visit 

## 2013-07-28 ENCOUNTER — Ambulatory Visit: Payer: Medicare Other | Admitting: *Deleted

## 2013-09-02 ENCOUNTER — Other Ambulatory Visit: Payer: Self-pay

## 2013-10-22 ENCOUNTER — Emergency Department (HOSPITAL_COMMUNITY): Payer: Medicare Other

## 2013-10-22 ENCOUNTER — Emergency Department (HOSPITAL_COMMUNITY)
Admission: EM | Admit: 2013-10-22 | Discharge: 2013-10-22 | Disposition: A | Discharge status: Home / Self Care | Payer: Medicare Other | Attending: Emergency Medicine | Admitting: Emergency Medicine

## 2013-10-22 ENCOUNTER — Encounter (HOSPITAL_COMMUNITY): Payer: Self-pay | Admitting: Emergency Medicine

## 2013-10-22 DIAGNOSIS — Z79899 Other long term (current) drug therapy: Secondary | ICD-10-CM | POA: Insufficient documentation

## 2013-10-22 DIAGNOSIS — IMO0001 Reserved for inherently not codable concepts without codable children: Secondary | ICD-10-CM | POA: Insufficient documentation

## 2013-10-22 DIAGNOSIS — Z8701 Personal history of pneumonia (recurrent): Secondary | ICD-10-CM | POA: Insufficient documentation

## 2013-10-22 DIAGNOSIS — Z791 Long term (current) use of non-steroidal anti-inflammatories (NSAID): Secondary | ICD-10-CM | POA: Insufficient documentation

## 2013-10-22 DIAGNOSIS — Z8719 Personal history of other diseases of the digestive system: Secondary | ICD-10-CM | POA: Insufficient documentation

## 2013-10-22 DIAGNOSIS — F17200 Nicotine dependence, unspecified, uncomplicated: Secondary | ICD-10-CM | POA: Insufficient documentation

## 2013-10-22 DIAGNOSIS — M25531 Pain in right wrist: Secondary | ICD-10-CM

## 2013-10-22 DIAGNOSIS — E1165 Type 2 diabetes mellitus with hyperglycemia: Secondary | ICD-10-CM

## 2013-10-22 DIAGNOSIS — J45901 Unspecified asthma with (acute) exacerbation: Secondary | ICD-10-CM | POA: Insufficient documentation

## 2013-10-22 DIAGNOSIS — Z8742 Personal history of other diseases of the female genital tract: Secondary | ICD-10-CM | POA: Insufficient documentation

## 2013-10-22 DIAGNOSIS — I10 Essential (primary) hypertension: Secondary | ICD-10-CM | POA: Insufficient documentation

## 2013-10-22 DIAGNOSIS — M25539 Pain in unspecified wrist: Secondary | ICD-10-CM | POA: Insufficient documentation

## 2013-10-22 DIAGNOSIS — Z880 Allergy status to penicillin: Secondary | ICD-10-CM | POA: Insufficient documentation

## 2013-10-22 LAB — CBG MONITORING, ED: GLUCOSE-CAPILLARY: 132 mg/dL — AB (ref 70–99)

## 2013-10-22 MED ORDER — NAPROXEN 500 MG PO TABS: 500.0000 mg | ORAL_TABLET | Freq: Two times a day (BID) | ORAL | Status: DC

## 2013-10-22 MED ORDER — NAPROXEN 500 MG PO TABS
500.0000 mg | ORAL_TABLET | Freq: Once | ORAL | Status: AC
  Administered 2013-10-22: 500 mg via ORAL
  Filled 2013-10-22: qty 1

## 2013-10-22 NOTE — Discharge Instructions (Signed)
Please use the medications prescribed to help with pain and inflammation. Use rest, ice, compression and elevation to reduce pain and swelling. Followup with your doctor for continued evaluation and treatment.     Wrist Pain Wrist injuries are frequent in adults and children. A sprain is an injury to the ligaments that hold your bones together. A strain is an injury to muscle or muscle cord-like structures (tendons) from stretching or pulling. Generally, when wrists are moderately tender to touch following a fall or injury, a break in the bone (fracture) may be present. Most wrist sprains or strains are better in 3 to 5 days, but complete healing may take several weeks. HOME CARE INSTRUCTIONS   Put ice on the injured area.  Put ice in a plastic bag.  Place a towel between your skin and the bag.  Leave the ice on for 15-20 minutes, 03-04 times a day, for the first 2 days.  Keep your arm raised above the level of your heart whenever possible to reduce swelling and pain.  Rest the injured area for at least 48 hours or as directed by your caregiver.  If a splint or elastic bandage has been applied, use it for as long as directed by your caregiver or until seen by a caregiver for a follow-up exam.  Only take over-the-counter or prescription medicines for pain, discomfort, or fever as directed by your caregiver.  Keep all follow-up appointments. You may need to follow up with a specialist or have follow-up X-rays. Improvement in pain level is not a guarantee that you did not fracture a bone in your wrist. The only way to determine whether or not you have a broken bone is by X-ray. SEEK IMMEDIATE MEDICAL CARE IF:   Your fingers are swollen, very red, white, or cold and blue.  Your fingers are numb or tingling.  You have increasing pain.  You have difficulty moving your fingers. MAKE SURE YOU:   Understand these instructions.  Will watch your condition.  Will get help right away if  you are not doing well or get worse. Document Released: 03/14/2005 Document Revised: 08/27/2011 Document Reviewed: 07/26/2010 [facility] Patient Information 2014 [place].

## 2013-10-22 NOTE — ED Notes (Signed)
Pt complains of right arm pain and swelling all day today, no injury noted and no complaints of anymore sx

## 2013-10-22 NOTE — ED Provider Notes (Signed)
CSN: [id number]     Arrival date & time 10/22/13  2016 History   None    This chart was scribed for non-physician practitioner, Hazel Sams, PA-C working with Richarda Blade, MD by Forrestine Him, ED Scribe. This patient was seen in room WTR8/WTR8 and the patient's care was started at 9:40 PM.  Chief Complaint  Patient presents with  . Arm Pain   The history is provided by the patient. No language interpreter was used.    HPI Comments: Sarah [name] is a 58 y.o. female with a PMHx of HTN, asthma, diabetes type 2, and GERD who presents to the Emergency Department complaining of constant, moderate R arm pain x 1 day that is unchanged. She describes this pain as "uncomfortable". Pt also reports swelling to this arm onset a few hours prior to arrival. Denies any known injury or trauma. However, pt states she uses her wrists frequently. She states she experiences shortness of breathe frequently but states this is baseline for her unchanged from new arm pain. Denies any worsening of this symptom.  She denies currently or previously being on any blood thinners. At this time she denies any fever, chills, cough, paresthesia, weakness, or loss of sensation. Pt plans to follow up with her PCP 5/12. She has no other pertinent past medical history. No other concerns this visit.  Past Medical History  Diagnosis Date  . Hypertension   . Asthma   . Gastritis   . Pneumonia   . Bacterial vaginosis   . GERD (gastroesophageal reflux disease)   . Diabetes type 2, uncontrolled     01/04/13- pt denies having diabetes  . Abdominal wall hernia 01/05/2013   Past Surgical History  Procedure Laterality Date  . Abdominal hysterectomy     History reviewed. No pertinent family history. History  Substance Use Topics  . Smoking status: Current Every Day Smoker -- 1.50 packs/day for 40 years    Types: Cigarettes  . Smokeless tobacco: Never Used  . Alcohol Use: No   OB History   Grav Para Term Preterm Abortions  TAB SAB Ect Mult Living                 Review of Systems  Constitutional: Negative for fever and chills.  HENT: Negative for congestion.   Eyes: Negative for redness.  Respiratory: Positive for shortness of breath. Negative for cough.   Cardiovascular: Negative for chest pain.  Musculoskeletal: Positive for arthralgias (R arm pain).  Skin: Negative for rash.  Neurological: Negative for weakness and numbness.  Psychiatric/Behavioral: Negative for confusion.      Allergies  Aspirin; Ibuprofen; and Penicillins  Home Medications   Prior to Admission medications   Medication Sig Start Date End Date Taking? Authorizing Provider  amLODipine (NORVASC) 10 MG tablet Take 1 tablet (10 mg total) by mouth daily. 02/19/13  Yes Nishant Dhungel, MD  glimepiride (AMARYL) 1 MG tablet Take 1 tablet (1 mg total) by mouth daily before breakfast. 02/19/13  Yes Nishant Dhungel, MD  hydrochlorothiazide (HYDRODIURIL) 50 MG tablet Take 1 tablet (50 mg total) by mouth daily. 02/19/13  Yes Nishant Dhungel, MD  ketotifen (ZADITOR) 0.025 % ophthalmic solution Place 2 drops into both eyes 2 (two) times daily.   Yes Historical Provider, MD  naproxen (NAPROSYN) 500 MG tablet Take 1,000 mg by mouth 2 (two) times daily with a meal.   Yes Historical Provider, MD  albuterol (PROVENTIL HFA;VENTOLIN HFA) 108 (90 BASE) MCG/ACT inhaler Inhale 2 puffs into  the lungs every 6 (six) hours as needed for wheezing.    Historical Provider, MD  albuterol (PROVENTIL) (5 MG/ML) 0.5% nebulizer solution Take 0.5 mLs (2.5 mg total) by nebulization every 4 (four) hours as needed for wheezing. 07/01/11 06/30/12  Sorin June Leap, MD   Triage Vitals: BP 150/73  Pulse 93  Temp(Src) 98 F (36.7 C) (Oral)  Resp 20  SpO2 97%   Physical Exam  Nursing note and vitals reviewed. Constitutional: She is oriented to person, place, and time. She appears well-developed and well-nourished.  HENT:  Head: Normocephalic and atraumatic.  Eyes: EOM are  normal.  Neck: Normal range of motion.  Cardiovascular: Normal rate.   Pulmonary/Chest: Effort normal.  Musculoskeletal: Normal range of motion. She exhibits edema and tenderness.  Tenderness to palpation over R thenar eminence   Neurological: She is alert and oriented to person, place, and time.  Skin: Skin is warm and dry.  Psychiatric: She has a normal mood and affect. Her behavior is normal.    ED Course  Procedures   DIAGNOSTIC STUDIES: Oxygen Saturation is 97% on RA, Normal by my interpretation.    COORDINATION OF CARE: 9:48 PM-Discussed treatment plan with pt at bedside and pt agreed to plan.     X-rays reviewed. No signs of acute injury. There are some arthritic changes within the carpal bones no other concerning findings. Patient with significant tenderness over the anterior wrist and thenar eminence area. No gross deformities. At this time we'll plan to use RICE treatment. Patient advised to followup with PCP. She states she has an appointment next week on the 12th.   Imaging Review Dg Wrist Complete Right  10/22/2013   CLINICAL DATA:  Anterior wrist pain and swelling.  No injury.  EXAM: RIGHT WRIST - COMPLETE 3+ VIEW  COMPARISON:  None.  FINDINGS: No acute fracture or subluxation. Amorphous density posterior to the proximal carpal row is indeterminate, but likely represents remote injury or chronic/dystrophic calcification. Sclerotic focus within the capitate body, most likely a bone island.  IMPRESSION: No acute osseous abnormality.   Electronically Signed   By: Jorje Guild M.D.   On: 10/22/2013 22:39    MDM   Final diagnoses:  Wrist pain, right     I personally performed the services described in this documentation, which was scribed in my presence. The recorded information has been reviewed and is accurate.    Martie Lee, PA-C 10/22/13 2256

## 2013-10-22 NOTE — ED Notes (Signed)
Patient transported to X-ray 

## 2013-10-23 NOTE — ED Provider Notes (Signed)
Medical screening examination/treatment/procedure(s) were performed by non-physician practitioner and as supervising physician I was immediately available for consultation/collaboration.  Richarda Blade, MD 10/23/13 [phone]

## 2014-04-13 ENCOUNTER — Other Ambulatory Visit: Payer: Self-pay

## 2014-04-13 DIAGNOSIS — Z1231 Encounter for screening mammogram for malignant neoplasm of breast: Secondary | ICD-10-CM

## 2014-05-07 ENCOUNTER — Ambulatory Visit: Payer: Medicare Other

## 2014-05-24 ENCOUNTER — Encounter (HOSPITAL_COMMUNITY): Payer: Self-pay | Admitting: Emergency Medicine

## 2014-05-24 ENCOUNTER — Emergency Department (HOSPITAL_COMMUNITY)
Admission: EM | Admit: 2014-05-24 | Discharge: 2014-05-24 | Disposition: A | Discharge status: Home / Self Care | Payer: Medicare Other | Attending: Emergency Medicine | Admitting: Emergency Medicine

## 2014-05-24 DIAGNOSIS — Z880 Allergy status to penicillin: Secondary | ICD-10-CM | POA: Diagnosis not present

## 2014-05-24 DIAGNOSIS — Z720 Tobacco use: Secondary | ICD-10-CM | POA: Insufficient documentation

## 2014-05-24 DIAGNOSIS — I10 Essential (primary) hypertension: Secondary | ICD-10-CM | POA: Insufficient documentation

## 2014-05-24 DIAGNOSIS — E119 Type 2 diabetes mellitus without complications: Secondary | ICD-10-CM | POA: Diagnosis not present

## 2014-05-24 DIAGNOSIS — Z791 Long term (current) use of non-steroidal anti-inflammatories (NSAID): Secondary | ICD-10-CM | POA: Diagnosis not present

## 2014-05-24 DIAGNOSIS — J45909 Unspecified asthma, uncomplicated: Secondary | ICD-10-CM | POA: Insufficient documentation

## 2014-05-24 DIAGNOSIS — Z90710 Acquired absence of both cervix and uterus: Secondary | ICD-10-CM | POA: Diagnosis not present

## 2014-05-24 DIAGNOSIS — Z8742 Personal history of other diseases of the female genital tract: Secondary | ICD-10-CM | POA: Insufficient documentation

## 2014-05-24 DIAGNOSIS — K858 Other acute pancreatitis without necrosis or infection: Secondary | ICD-10-CM

## 2014-05-24 DIAGNOSIS — Z79899 Other long term (current) drug therapy: Secondary | ICD-10-CM | POA: Diagnosis not present

## 2014-05-24 DIAGNOSIS — K2970 Gastritis, unspecified, without bleeding: Secondary | ICD-10-CM | POA: Diagnosis not present

## 2014-05-24 DIAGNOSIS — K219 Gastro-esophageal reflux disease without esophagitis: Secondary | ICD-10-CM | POA: Diagnosis not present

## 2014-05-24 DIAGNOSIS — Z8701 Personal history of pneumonia (recurrent): Secondary | ICD-10-CM | POA: Diagnosis not present

## 2014-05-24 DIAGNOSIS — R1013 Epigastric pain: Secondary | ICD-10-CM | POA: Diagnosis present

## 2014-05-24 LAB — CBC WITH DIFFERENTIAL/PLATELET
BASOS ABS: 0 10*3/uL (ref 0.0–0.1)
BASOS PCT: 0 % (ref 0–1)
Eosinophils Absolute: 0.1 10*3/uL (ref 0.0–0.7)
Eosinophils Relative: 2 % (ref 0–5)
HCT: 39.9 % (ref 36.0–46.0)
Hemoglobin: 13.6 g/dL (ref 12.0–15.0)
Lymphocytes Relative: 51 % — ABNORMAL HIGH (ref 12–46)
Lymphs Abs: 2.7 10*3/uL (ref 0.7–4.0)
MCH: 27.4 pg (ref 26.0–34.0)
MCHC: 34.1 g/dL (ref 30.0–36.0)
MCV: 80.4 fL (ref 78.0–100.0)
Monocytes Absolute: 0.4 10*3/uL (ref 0.1–1.0)
Monocytes Relative: 7 % (ref 3–12)
NEUTROS PCT: 40 % — AB (ref 43–77)
Neutro Abs: 2.2 10*3/uL (ref 1.7–7.7)
PLATELETS: 249 10*3/uL (ref 150–400)
RBC: 4.96 MIL/uL (ref 3.87–5.11)
RDW: 14.9 % (ref 11.5–15.5)
WBC: 5.4 10*3/uL (ref 4.0–10.5)

## 2014-05-24 LAB — LIPASE, BLOOD: Lipase: 93 U/L — ABNORMAL HIGH (ref 11–59)

## 2014-05-24 LAB — URINALYSIS, ROUTINE W REFLEX MICROSCOPIC
Bilirubin Urine: NEGATIVE
Glucose, UA: 100 mg/dL — AB
Hgb urine dipstick: NEGATIVE
Ketones, ur: NEGATIVE mg/dL
LEUKOCYTES UA: NEGATIVE
Nitrite: NEGATIVE
PROTEIN: NEGATIVE mg/dL
Specific Gravity, Urine: 1.021 (ref 1.005–1.030)
UROBILINOGEN UA: 1 mg/dL (ref 0.0–1.0)
pH: 5.5 (ref 5.0–8.0)

## 2014-05-24 LAB — I-STAT CHEM 8, ED
BUN: 12 mg/dL (ref 6–23)
CALCIUM ION: 1.22 mmol/L (ref 1.13–1.30)
Chloride: 99 mEq/L (ref 96–112)
Creatinine, Ser: 0.7 mg/dL (ref 0.50–1.10)
Glucose, Bld: 188 mg/dL — ABNORMAL HIGH (ref 70–99)
HCT: 44 % (ref 36.0–46.0)
Hemoglobin: 15 g/dL (ref 12.0–15.0)
Potassium: 3.4 mEq/L — ABNORMAL LOW (ref 3.7–5.3)
SODIUM: 139 meq/L (ref 137–147)
TCO2: 24 mmol/L (ref 0–100)

## 2014-05-24 LAB — HEPATIC FUNCTION PANEL
ALT: 11 U/L (ref 0–35)
AST: 13 U/L (ref 0–37)
Albumin: 3.8 g/dL (ref 3.5–5.2)
Alkaline Phosphatase: 98 U/L (ref 39–117)
Total Bilirubin: <0.2 mg/dL — ABNORMAL LOW (ref 0.3–1.2)
Total Protein: 7.7 g/dL (ref 6.0–8.3)

## 2014-05-24 LAB — BASIC METABOLIC PANEL
Anion gap: 13 (ref 5–15)
BUN: 13 mg/dL (ref 6–23)
CALCIUM: 9.5 mg/dL (ref 8.4–10.5)
CO2: 26 mEq/L (ref 19–32)
Chloride: 100 mEq/L (ref 96–112)
Creatinine, Ser: 0.64 mg/dL (ref 0.50–1.10)
GFR calc Af Amer: >90 mL/min (ref >90)
Glucose, Bld: 186 mg/dL — ABNORMAL HIGH (ref 70–99)
Potassium: 3.6 mEq/L — ABNORMAL LOW (ref 3.7–5.3)
Sodium: 139 mEq/L (ref 137–147)

## 2014-05-24 LAB — CBG MONITORING, ED: GLUCOSE-CAPILLARY: 194 mg/dL — AB (ref 70–99)

## 2014-05-24 MED ORDER — PANTOPRAZOLE SODIUM 20 MG PO TBEC: 20.0000 mg | DELAYED_RELEASE_TABLET | Freq: Every day | ORAL | Status: DC

## 2014-05-24 MED ORDER — PANTOPRAZOLE SODIUM 40 MG IV SOLR
40.0000 mg | Freq: Once | INTRAVENOUS | Status: AC
  Administered 2014-05-24: 40 mg via INTRAVENOUS
  Filled 2014-05-24: qty 40

## 2014-05-24 MED ORDER — SUCRALFATE 1 G PO TABS: 1.0000 g | ORAL_TABLET | Freq: Three times a day (TID) | ORAL | Status: DC

## 2014-05-24 MED ORDER — GI COCKTAIL ~~LOC~~
30.0000 mL | Freq: Once | ORAL | Status: AC
  Administered 2014-05-24: 30 mL via ORAL
  Filled 2014-05-24: qty 30

## 2014-05-24 NOTE — ED Provider Notes (Addendum)
CSN: [id number]     Arrival date & time 05/24/14  1037 History   First MD Initiated Contact with Patient 05/24/14 1057     Chief Complaint  Patient presents with  . abdominal distention      (Consider location/radiation/quality/duration/timing/severity/associated sxs/prior Treatment) HPI Comments: Patient presents with epigastric pain and burning. He has a history of GERD as well as pancreatitis. He's had about a 3 to four-day history of some increasing pain in his epigastric area and he also has burning in his throat. He feels like it's his reflux increasing. He denies any nausea or vomiting. He denies any other chest pain. Denies any shortness of breath. Having normal bowel movements. He denies any fevers or chills. He's not been taking anything for the pain. He used to take some antacid medications but says he can't afford it currently. He denies any blood in his stools.   Past Medical History  Diagnosis Date  . Hypertension   . Asthma   . Gastritis   . Pneumonia   . Bacterial vaginosis   . GERD (gastroesophageal reflux disease)   . Diabetes type 2, uncontrolled     01/04/13- pt denies having diabetes  . Abdominal wall hernia 01/05/2013   Past Surgical History  Procedure Laterality Date  . Abdominal hysterectomy     No family history on file. History  Substance Use Topics  . Smoking status: Current Every Day Smoker -- 1.50 packs/day for 40 years    Types: Cigarettes  . Smokeless tobacco: Never Used  . Alcohol Use: No   OB History    No data available     Review of Systems  Constitutional: Negative for fever, chills, diaphoresis and fatigue.  HENT: Negative for congestion, rhinorrhea and sneezing.   Eyes: Negative.   Respiratory: Negative for cough, chest tightness and shortness of breath.   Cardiovascular: Negative for chest pain and leg swelling.  Gastrointestinal: Positive for abdominal pain and abdominal distention. Negative for vomiting, diarrhea and blood in  stool.  Genitourinary: Negative for frequency, hematuria, flank pain and difficulty urinating.  Musculoskeletal: Negative for back pain and arthralgias.  Skin: Negative for rash.  Neurological: Negative for dizziness, speech difficulty, weakness, numbness and headaches.      Allergies  Aspirin; Ibuprofen; and Penicillins  Home Medications   Prior to Admission medications   Medication Sig Start Date End Date Taking? Authorizing Provider  albuterol (PROVENTIL) (5 MG/ML) 0.5% nebulizer solution Take 2.5 mg by nebulization every 4 (four) hours as needed for wheezing or shortness of breath.   Yes Historical Provider, MD  allopurinol (ZYLOPRIM) 100 MG tablet Take 100 mg by mouth daily.   Yes Historical Provider, MD  amLODipine (NORVASC) 10 MG tablet Take 1 tablet (10 mg total) by mouth daily. 02/19/13  Yes Nishant Dhungel, MD  glimepiride (AMARYL) 1 MG tablet Take 1 tablet (1 mg total) by mouth daily before breakfast. 02/19/13  Yes Nishant Dhungel, MD  hydrochlorothiazide (HYDRODIURIL) 50 MG tablet Take 1 tablet (50 mg total) by mouth daily. 02/19/13  Yes Nishant Dhungel, MD  HYDROcodone-acetaminophen (NORCO/VICODIN) 5-325 MG per tablet Take 2 tablets by mouth every 6 (six) hours as needed for moderate pain or severe pain (pain).   Yes Historical Provider, MD  lisinopril (PRINIVIL,ZESTRIL) 5 MG tablet Take 5 mg by mouth daily.   Yes Historical Provider, MD  meloxicam (MOBIC) 7.5 MG tablet Take 7.5 mg by mouth daily.   Yes Historical Provider, MD  metFORMIN (GLUCOPHAGE) 500 MG tablet Take 500  mg by mouth 2 (two) times daily with a meal.   Yes Historical Provider, MD  omeprazole (PRILOSEC) 40 MG capsule Take 40 mg by mouth daily.   Yes Historical Provider, MD  albuterol (PROVENTIL HFA;VENTOLIN HFA) 108 (90 BASE) MCG/ACT inhaler Inhale 2 puffs into the lungs every 6 (six) hours as needed for wheezing.    Historical Provider, MD  albuterol (PROVENTIL) (5 MG/ML) 0.5% nebulizer solution Take 0.5 mLs (2.5 mg  total) by nebulization every 4 (four) hours as needed for wheezing. Patient not taking: Reported on 05/24/2014 07/01/11 06/30/12  Sorin June Leap, MD  naproxen (NAPROSYN) 500 MG tablet Take 1 tablet (500 mg total) by mouth 2 (two) times daily. Patient not taking: Reported on 05/24/2014 10/22/13   Ruthell Rummage Dammen, PA-C  pantoprazole (PROTONIX) 20 MG tablet Take 1 tablet (20 mg total) by mouth daily. 05/24/14   Malvin Johns, MD  sucralfate (CARAFATE) 1 G tablet Take 1 tablet (1 g total) by mouth 4 (four) times daily -  with meals and at bedtime. 05/24/14   Malvin Johns, MD   BP 118/55 mmHg  Pulse 78  Temp(Src) 97.6 F (36.4 C) (Oral)  Resp 18  Ht 5\' 7"  (1.702 m)  Wt 240 lb (108.863 kg)  BMI 37.58 kg/m2  SpO2 96% Physical Exam  Constitutional: She is oriented to person, place, and time. She appears well-developed and well-nourished.  HENT:  Head: Normocephalic and atraumatic.  Eyes: Pupils are equal, round, and reactive to light.  Neck: Normal range of motion. Neck supple.  Cardiovascular: Normal rate, regular rhythm and normal heart sounds.   Pulmonary/Chest: Effort normal and breath sounds normal. No respiratory distress. She has no wheezes. She has no rales. She exhibits no tenderness.  Abdominal: Soft. Bowel sounds are normal. There is tenderness (Mild tenderness over the epigastrium). There is no rebound and no guarding.  Musculoskeletal: Normal range of motion. She exhibits no edema.  Lymphadenopathy:    She has no cervical adenopathy.  Neurological: She is alert and oriented to person, place, and time.  Skin: Skin is warm and dry. No rash noted.  Psychiatric: She has a normal mood and affect.    ED Course  Procedures (including critical care time) Labs Review Results for orders placed or performed during the hospital encounter of 81/15/72  Basic metabolic panel  Result Value Ref Range   Sodium 139 137 - 147 mEq/L   Potassium 3.6 (L) 3.7 - 5.3 mEq/L   Chloride 100 96 - 112 mEq/L    CO2 26 19 - 32 mEq/L   Glucose, Bld 186 (H) 70 - 99 mg/dL   BUN 13 6 - 23 mg/dL   Creatinine, Ser 0.64 0.50 - 1.10 mg/dL   Calcium 9.5 8.4 - 10.5 mg/dL   GFR calc non Af Amer >90 >90 mL/min   GFR calc Af Amer >90 >90 mL/min   Anion gap 13 5 - 15  CBC WITH DIFFERENTIAL  Result Value Ref Range   WBC 5.4 4.0 - 10.5 K/uL   RBC 4.96 3.87 - 5.11 MIL/uL   Hemoglobin 13.6 12.0 - 15.0 g/dL   HCT 39.9 36.0 - 46.0 %   MCV 80.4 78.0 - 100.0 fL   MCH 27.4 26.0 - 34.0 pg   MCHC 34.1 30.0 - 36.0 g/dL   RDW 14.9 11.5 - 15.5 %   Platelets 249 150 - 400 K/uL   Neutrophils Relative % 40 (L) 43 - 77 %   Neutro Abs 2.2 1.7 -  7.7 K/uL   Lymphocytes Relative 51 (H) 12 - 46 %   Lymphs Abs 2.7 0.7 - 4.0 K/uL   Monocytes Relative 7 3 - 12 %   Monocytes Absolute 0.4 0.1 - 1.0 K/uL   Eosinophils Relative 2 0 - 5 %   Eosinophils Absolute 0.1 0.0 - 0.7 K/uL   Basophils Relative 0 0 - 1 %   Basophils Absolute 0.0 0.0 - 0.1 K/uL  Urinalysis with microscopic  Result Value Ref Range   Color, Urine YELLOW YELLOW   APPearance CLOUDY (A) CLEAR   Specific Gravity, Urine 1.021 1.005 - 1.030   pH 5.5 5.0 - 8.0   Glucose, UA 100 (A) NEGATIVE mg/dL   Hgb urine dipstick NEGATIVE NEGATIVE   Bilirubin Urine NEGATIVE NEGATIVE   Ketones, ur NEGATIVE NEGATIVE mg/dL   Protein, ur NEGATIVE NEGATIVE mg/dL   Urobilinogen, UA 1.0 0.0 - 1.0 mg/dL   Nitrite NEGATIVE NEGATIVE   Leukocytes, UA NEGATIVE NEGATIVE  Lipase, blood  Result Value Ref Range   Lipase 93 (H) 11 - 59 U/L  Hepatic function panel  Result Value Ref Range   Total Protein 7.7 6.0 - 8.3 g/dL   Albumin 3.8 3.5 - 5.2 g/dL   AST 13 0 - 37 U/L   ALT 11 0 - 35 U/L   Alkaline Phosphatase 98 39 - 117 U/L   Total Bilirubin <0.2 (L) 0.3 - 1.2 mg/dL   Bilirubin, Direct <0.2 0.0 - 0.3 mg/dL   Indirect Bilirubin NOT CALCULATED 0.3 - 0.9 mg/dL  I-Stat Chem 8, ED  Result Value Ref Range   Sodium 139 137 - 147 mEq/L   Potassium 3.4 (L) 3.7 - 5.3 mEq/L    Chloride 99 96 - 112 mEq/L   BUN 12 6 - 23 mg/dL   Creatinine, Ser 0.70 0.50 - 1.10 mg/dL   Glucose, Bld 188 (H) 70 - 99 mg/dL   Calcium, Ion 1.22 1.13 - 1.30 mmol/L   TCO2 24 0 - 100 mmol/L   Hemoglobin 15.0 12.0 - 15.0 g/dL   HCT 44.0 36.0 - 46.0 %  CBG monitoring, ED  Result Value Ref Range   Glucose-Capillary 194 (H) 70 - 99 mg/dL   No results found.    Imaging Review No results found.   EKG Interpretation None      Date: 05/24/2014  Rate: 79  Rhythm: normal sinus rhythm  QRS Axis: normal  Intervals: normal  ST/T Wave abnormalities: nonspecific ST/T changes  Conduction Disutrbances:none  Narrative Interpretation:   Old EKG Reviewed: none available   MDM   Final diagnoses:  Other acute pancreatitis  Gastritis    PT presents with epigastric pain.  Mildly elevated lipase.  Has almost complete relief with GI cocktail.  Will give rx for protonix and carafate.  Advised clear liquids for a few days.  F/u with PMD.    Malvin Johns, MD 05/24/14 [place], MD 05/24/14 [phone]

## 2014-05-24 NOTE — ED Notes (Signed)
Patient is in restroom giving urine specimen.

## 2014-05-24 NOTE — Discharge Instructions (Signed)
Gastritis, Adult °Gastritis is soreness and swelling (inflammation) of the lining of the stomach. Gastritis can develop as a sudden onset (acute) or long-term (chronic) condition. If gastritis is not treated, it can lead to stomach bleeding and ulcers. °CAUSES  °Gastritis occurs when the stomach lining is weak or damaged. Digestive juices from the stomach then inflame the weakened stomach lining. The stomach lining may be weak or damaged due to viral or bacterial infections. One common bacterial infection is the Helicobacter pylori infection. Gastritis can also result from excessive alcohol consumption, taking certain medicines, or having too much acid in the stomach.  °SYMPTOMS  °In some cases, there are no symptoms. When symptoms are present, they may include: °· Pain or a burning sensation in the upper abdomen. °· Nausea. °· Vomiting. °· An uncomfortable feeling of fullness after eating. °DIAGNOSIS  °Your caregiver may suspect you have gastritis based on your symptoms and a physical exam. To determine the cause of your gastritis, your caregiver may perform the following: °· Blood or stool tests to check for the H pylori bacterium. °· Gastroscopy. A thin, flexible tube (endoscope) is passed down the esophagus and into the stomach. The endoscope has a light and camera on the end. Your caregiver uses the endoscope to view the inside of the stomach. °· Taking a tissue sample (biopsy) from the stomach to examine under a microscope. °TREATMENT  °Depending on the cause of your gastritis, medicines may be prescribed. If you have a bacterial infection, such as an H pylori infection, antibiotics may be given. If your gastritis is caused by too much acid in the stomach, H2 blockers or antacids may be given. Your caregiver may recommend that you stop taking aspirin, ibuprofen, or other nonsteroidal anti-inflammatory drugs (NSAIDs). °HOME CARE INSTRUCTIONS °· Only take over-the-counter or prescription medicines as directed by  your caregiver. °· If you were given antibiotic medicines, take them as directed. Finish them even if you start to feel better. °· Drink enough fluids to keep your urine clear or pale yellow. °· Avoid foods and drinks that make your symptoms worse, such as: °· Caffeine or alcoholic drinks. °· Chocolate. °· Peppermint or mint flavorings. °· Garlic and onions. °· Spicy foods. °· Citrus fruits, such as oranges, lemons, or limes. °· Tomato-based foods such as sauce, chili, salsa, and pizza. °· Fried and fatty foods. °· Eat small, frequent meals instead of large meals. °SEEK IMMEDIATE MEDICAL CARE IF:  °· You have black or dark red stools. °· You vomit blood or material that looks like coffee grounds. °· You are unable to keep fluids down. °· Your abdominal pain gets worse. °· You have a fever. °· You do not feel better after 1 week. °· You have any other questions or concerns. °MAKE SURE YOU: °· Understand these instructions. °· Will watch your condition. °· Will get help right away if you are not doing well or get worse. °Document Released: 05/29/2001 Document Revised: 12/04/2011 Document Reviewed: 07/18/2011 °[employer]® Patient Information ©2015 [employer]. This information is not intended to replace advice given to you by your health care provider. Make sure you discuss any questions you have with your health care provider. ° °Acute Pancreatitis °Acute pancreatitis is a disease in which the pancreas becomes suddenly inflamed. The pancreas is a large gland located behind your stomach. The pancreas produces enzymes that help digest food. The pancreas also releases the hormones glucagon and insulin that help regulate blood sugar. Damage to the pancreas occurs when the digestive   enzymes from the pancreas are activated and begin attacking the pancreas before being released into the intestine. Most acute attacks last a couple of days and can cause serious complications. Some people become dehydrated and develop low blood  pressure. In severe cases, bleeding into the pancreas can lead to shock and can be life-threatening. The lungs, heart, and kidneys may fail. °CAUSES  °Pancreatitis can happen to anyone. In some cases, the cause is unknown. Most cases are caused by: °· Alcohol abuse. °· Gallstones. °Other less common causes are: °· Certain medicines. °· Exposure to certain chemicals. °· Infection. °· Damage caused by an accident (trauma). °· Abdominal surgery. °SYMPTOMS  °· Pain in the upper abdomen that may radiate to the back. °· Tenderness and swelling of the abdomen. °· Nausea and vomiting. °DIAGNOSIS  °Your caregiver will perform a physical exam. Blood and stool tests may be done to confirm the diagnosis. Imaging tests may also be done, such as X-rays, CT scans, or an ultrasound of the abdomen. °TREATMENT  °Treatment usually requires a stay in the hospital. Treatment may include: °· Pain medicine. °· Fluid replacement through an intravenous line (IV). °· Placing a tube in the stomach to remove stomach contents and control vomiting. °· Not eating for 3 or 4 days. This gives your pancreas a rest, because enzymes are not being produced that can cause further damage. °· Antibiotic medicines if your condition is caused by an infection. °· Surgery of the pancreas or gallbladder. °HOME CARE INSTRUCTIONS  °· Follow the diet advised by your caregiver. This may involve avoiding alcohol and decreasing the amount of fat in your diet. °· Eat smaller, more frequent meals. This reduces the amount of digestive juices the pancreas produces. °· Drink enough fluids to keep your urine clear or pale yellow. °· Only take over-the-counter or prescription medicines as directed by your caregiver. °· Avoid drinking alcohol if it caused your condition. °· Do not smoke. °· Get plenty of rest. °· Check your blood sugar at home as directed by your caregiver. °· Keep all follow-up appointments as directed by your caregiver. °SEEK MEDICAL CARE IF:  °· You do  not recover as quickly as expected. °· You develop new or worsening symptoms. °· You have persistent pain, weakness, or nausea. °· You recover and then have another episode of pain. °SEEK IMMEDIATE MEDICAL CARE IF:  °· You are unable to eat or keep fluids down. °· Your pain becomes severe. °· You have a fever or persistent symptoms for more than 2 to 3 days. °· You have a fever and your symptoms suddenly get worse. °· Your skin or the white part of your eyes turn yellow (jaundice). °· You develop vomiting. °· You feel dizzy, or you faint. °· Your blood sugar is high (over 300 mg/dL). °MAKE SURE YOU:  °· Understand these instructions. °· Will watch your condition. °· Will get help right away if you are not doing well or get worse. °Document Released: 06/04/2005 Document Revised: 12/04/2011 Document Reviewed: 09/13/2011 °[employer]® Patient Information ©2015 [employer]. This information is not intended to replace advice given to you by your health care provider. Make sure you discuss any questions you have with your health care provider. ° °

## 2014-05-24 NOTE — ED Notes (Signed)
Per pt, abdominal distension and epigastric pain for 3 days-stopped taking GERD medicine

## 2014-05-24 NOTE — ED Notes (Signed)
CBG 194 

## 2014-06-02 ENCOUNTER — Emergency Department (HOSPITAL_COMMUNITY): Payer: Medicare Other

## 2014-06-02 ENCOUNTER — Emergency Department (HOSPITAL_COMMUNITY)
Admission: EM | Admit: 2014-06-02 | Discharge: 2014-06-02 | Disposition: A | Discharge status: Home / Self Care | Payer: Medicare Other | Attending: Emergency Medicine | Admitting: Emergency Medicine

## 2014-06-02 DIAGNOSIS — R1011 Right upper quadrant pain: Secondary | ICD-10-CM

## 2014-06-02 DIAGNOSIS — Z880 Allergy status to penicillin: Secondary | ICD-10-CM | POA: Insufficient documentation

## 2014-06-02 DIAGNOSIS — J45909 Unspecified asthma, uncomplicated: Secondary | ICD-10-CM | POA: Diagnosis not present

## 2014-06-02 DIAGNOSIS — I10 Essential (primary) hypertension: Secondary | ICD-10-CM | POA: Insufficient documentation

## 2014-06-02 DIAGNOSIS — E119 Type 2 diabetes mellitus without complications: Secondary | ICD-10-CM | POA: Diagnosis not present

## 2014-06-02 DIAGNOSIS — Z79899 Other long term (current) drug therapy: Secondary | ICD-10-CM | POA: Insufficient documentation

## 2014-06-02 DIAGNOSIS — Z8742 Personal history of other diseases of the female genital tract: Secondary | ICD-10-CM | POA: Diagnosis not present

## 2014-06-02 DIAGNOSIS — Z8701 Personal history of pneumonia (recurrent): Secondary | ICD-10-CM | POA: Diagnosis not present

## 2014-06-02 DIAGNOSIS — Z720 Tobacco use: Secondary | ICD-10-CM | POA: Insufficient documentation

## 2014-06-02 DIAGNOSIS — K439 Ventral hernia without obstruction or gangrene: Secondary | ICD-10-CM | POA: Insufficient documentation

## 2014-06-02 DIAGNOSIS — R1013 Epigastric pain: Secondary | ICD-10-CM | POA: Diagnosis present

## 2014-06-02 DIAGNOSIS — Z791 Long term (current) use of non-steroidal anti-inflammatories (NSAID): Secondary | ICD-10-CM | POA: Insufficient documentation

## 2014-06-02 DIAGNOSIS — K219 Gastro-esophageal reflux disease without esophagitis: Secondary | ICD-10-CM | POA: Insufficient documentation

## 2014-06-02 LAB — CBC WITH DIFFERENTIAL/PLATELET
Basophils Absolute: 0 10*3/uL (ref 0.0–0.1)
Basophils Relative: 0 % (ref 0–1)
Eosinophils Absolute: 0.1 10*3/uL (ref 0.0–0.7)
Eosinophils Relative: 2 % (ref 0–5)
HCT: 45 % (ref 36.0–46.0)
HEMOGLOBIN: 15.1 g/dL — AB (ref 12.0–15.0)
LYMPHS ABS: 2.9 10*3/uL (ref 0.7–4.0)
Lymphocytes Relative: 55 % — ABNORMAL HIGH (ref 12–46)
MCH: 26.9 pg (ref 26.0–34.0)
MCHC: 33.6 g/dL (ref 30.0–36.0)
MCV: 80.1 fL (ref 78.0–100.0)
MONOS PCT: 7 % (ref 3–12)
Monocytes Absolute: 0.4 10*3/uL (ref 0.1–1.0)
NEUTROS ABS: 2 10*3/uL (ref 1.7–7.7)
NEUTROS PCT: 36 % — AB (ref 43–77)
Platelets: 268 10*3/uL (ref 150–400)
RBC: 5.62 MIL/uL — AB (ref 3.87–5.11)
RDW: 14.9 % (ref 11.5–15.5)
WBC: 5.4 10*3/uL (ref 4.0–10.5)

## 2014-06-02 LAB — COMPREHENSIVE METABOLIC PANEL
ALK PHOS: 94 U/L (ref 39–117)
ALT: 10 U/L (ref 0–35)
ANION GAP: 13 (ref 5–15)
AST: 13 U/L (ref 0–37)
Albumin: 4.2 g/dL (ref 3.5–5.2)
BUN: 10 mg/dL (ref 6–23)
CHLORIDE: 92 meq/L — AB (ref 96–112)
CO2: 28 mEq/L (ref 19–32)
Calcium: 10.3 mg/dL (ref 8.4–10.5)
Creatinine, Ser: 0.62 mg/dL (ref 0.50–1.10)
GFR calc non Af Amer: >90 mL/min (ref >90)
GLUCOSE: 107 mg/dL — AB (ref 70–99)
POTASSIUM: 2.8 meq/L — AB (ref 3.7–5.3)
Sodium: 133 mEq/L — ABNORMAL LOW (ref 137–147)
Total Bilirubin: 0.5 mg/dL (ref 0.3–1.2)
Total Protein: 8.3 g/dL (ref 6.0–8.3)

## 2014-06-02 LAB — URINALYSIS, ROUTINE W REFLEX MICROSCOPIC
Bilirubin Urine: NEGATIVE
Glucose, UA: NEGATIVE mg/dL
HGB URINE DIPSTICK: NEGATIVE
Ketones, ur: NEGATIVE mg/dL
Leukocytes, UA: NEGATIVE
Nitrite: NEGATIVE
PROTEIN: NEGATIVE mg/dL
Specific Gravity, Urine: 1.014 (ref 1.005–1.030)
UROBILINOGEN UA: 0.2 mg/dL (ref 0.0–1.0)
pH: 5 (ref 5.0–8.0)

## 2014-06-02 LAB — LIPASE, BLOOD: Lipase: 73 U/L — ABNORMAL HIGH (ref 11–59)

## 2014-06-02 MED ORDER — SODIUM CHLORIDE 0.9 % IV BOLUS (SEPSIS): 1000.0000 mL | Freq: Once | INTRAVENOUS | Status: DC

## 2014-06-02 MED ORDER — OXYCODONE-ACETAMINOPHEN 5-325 MG PO TABS: 1.0000 | ORAL_TABLET | Freq: Four times a day (QID) | ORAL | Status: DC | PRN

## 2014-06-02 MED ORDER — GI COCKTAIL ~~LOC~~
30.0000 mL | Freq: Once | ORAL | Status: AC
  Administered 2014-06-02: 30 mL via ORAL
  Filled 2014-06-02: qty 30

## 2014-06-02 MED ORDER — POTASSIUM CHLORIDE CRYS ER 20 MEQ PO TBCR
40.0000 meq | EXTENDED_RELEASE_TABLET | Freq: Once | ORAL | Status: AC
  Administered 2014-06-02: 40 meq via ORAL
  Filled 2014-06-02: qty 2

## 2014-06-02 MED ORDER — MORPHINE SULFATE 4 MG/ML IJ SOLN
4.0000 mg | Freq: Once | INTRAMUSCULAR | Status: AC
  Administered 2014-06-02: 4 mg via INTRAVENOUS
  Filled 2014-06-02: qty 1

## 2014-06-02 MED ORDER — IOHEXOL 300 MG/ML  SOLN
100.0000 mL | Freq: Once | INTRAMUSCULAR | Status: AC | PRN
  Administered 2014-06-02: 100 mL via INTRAVENOUS

## 2014-06-02 NOTE — ED Provider Notes (Signed)
CSN: [id number]     Arrival date & time 06/02/14  1405 History   First MD Initiated Contact with Patient 06/02/14 1505     Chief Complaint  Patient presents with  . Nausea  . Abdominal Pain     (Consider location/radiation/quality/duration/timing/severity/associated sxs/prior Treatment) Patient is a 58 y.o. female presenting with abdominal pain.  Abdominal Pain Pain location:  Epigastric Pain quality: aching   Pain radiates to:  Does not radiate Pain severity:  Severe Onset quality:  Gradual Duration:  3 weeks Timing:  Constant Progression:  Worsening Chronicity:  New Context comment:  Pain started on Thansgiving.  Eval'd in the ED on the 7th of Dec.  Started treataed for gastritis with sucralfate and protonix.   Relieved by:  Nothing Exacerbated by: sucralfate.  Not better or worse with food. Associated symptoms: nausea   Associated symptoms: no fever and no vomiting     Past Medical History  Diagnosis Date  . Hypertension   . Asthma   . Gastritis   . Pneumonia   . Bacterial vaginosis   . GERD (gastroesophageal reflux disease)   . Diabetes type 2, uncontrolled     01/04/13- pt denies having diabetes  . Abdominal wall hernia 01/05/2013   Past Surgical History  Procedure Laterality Date  . Abdominal hysterectomy     No family history on file. History  Substance Use Topics  . Smoking status: Current Every Day Smoker -- 1.50 packs/day for 40 years    Types: Cigarettes  . Smokeless tobacco: Never Used  . Alcohol Use: No   OB History    No data available     Review of Systems  Constitutional: Negative for fever.  Gastrointestinal: Positive for nausea and abdominal pain. Negative for vomiting.  All other systems reviewed and are negative.     Allergies  Aspirin; Ibuprofen; and Penicillins  Home Medications   Prior to Admission medications   Medication Sig Start Date End Date Taking? Authorizing Provider  allopurinol (ZYLOPRIM) 100 MG tablet Take 100 mg  by mouth daily.   Yes Historical Provider, MD  amLODipine (NORVASC) 10 MG tablet Take 1 tablet (10 mg total) by mouth daily. 02/19/13  Yes Nishant Dhungel, MD  glimepiride (AMARYL) 1 MG tablet Take 1 tablet (1 mg total) by mouth daily before breakfast. 02/19/13  Yes Nishant Dhungel, MD  hydrochlorothiazide (HYDRODIURIL) 50 MG tablet Take 1 tablet (50 mg total) by mouth daily. 02/19/13  Yes Nishant Dhungel, MD  lisinopril (PRINIVIL,ZESTRIL) 5 MG tablet Take 5 mg by mouth daily.   Yes Historical Provider, MD  meloxicam (MOBIC) 7.5 MG tablet Take 7.5 mg by mouth daily.   Yes Historical Provider, MD  metFORMIN (GLUCOPHAGE) 500 MG tablet Take 500 mg by mouth 2 (two) times daily with a meal.   Yes Historical Provider, MD  pantoprazole (PROTONIX) 20 MG tablet Take 1 tablet (20 mg total) by mouth daily. 05/24/14  Yes Malvin Johns, MD  pioglitazone (ACTOS) 15 MG tablet Take 15 mg by mouth daily.   Yes Historical Provider, MD  sucralfate (CARAFATE) 1 G tablet Take 1 tablet (1 g total) by mouth 4 (four) times daily -  with meals and at bedtime. 05/24/14  Yes Malvin Johns, MD  albuterol (PROVENTIL HFA;VENTOLIN HFA) 108 (90 BASE) MCG/ACT inhaler Inhale 2 puffs into the lungs every 6 (six) hours as needed for wheezing.    Historical Provider, MD  albuterol (PROVENTIL) (5 MG/ML) 0.5% nebulizer solution Take 0.5 mLs (2.5 mg total) by nebulization  every 4 (four) hours as needed for wheezing. 07/01/11 06/30/12  Sorin June Leap, MD  albuterol (PROVENTIL) (5 MG/ML) 0.5% nebulizer solution Take 2.5 mg by nebulization every 4 (four) hours as needed for wheezing or shortness of breath.    Historical Provider, MD  HYDROcodone-acetaminophen (NORCO/VICODIN) 5-325 MG per tablet Take 2 tablets by mouth every 6 (six) hours as needed for moderate pain or severe pain (pain).    Historical Provider, MD  naproxen (NAPROSYN) 500 MG tablet Take 1 tablet (500 mg total) by mouth 2 (two) times daily. Patient not taking: Reported on 05/24/2014 10/22/13    Ruthell Rummage Dammen, PA-C   BP 138/63 mmHg  Pulse 92  Temp(Src) 97.8 F (36.6 C) (Oral)  Resp 18  SpO2 100% Physical Exam  Constitutional: She is oriented to person, place, and time. She appears well-developed and well-nourished. No distress.  HENT:  Head: Normocephalic and atraumatic.  Mouth/Throat: Oropharynx is clear and moist.  Eyes: Conjunctivae are normal. Pupils are equal, round, and reactive to light. No scleral icterus.  Neck: Neck supple.  Cardiovascular: Normal rate, regular rhythm, normal heart sounds and intact distal pulses.   No murmur heard. Pulmonary/Chest: Effort normal and breath sounds normal. No stridor. No respiratory distress. She has no rales.  Abdominal: Soft. Bowel sounds are normal. She exhibits no distension. There is tenderness (worst in RUQ) in the right upper quadrant, epigastric area and left upper quadrant. There is no rigidity, no rebound, no guarding and no CVA tenderness.  Musculoskeletal: Normal range of motion.  Neurological: She is alert and oriented to person, place, and time.  Skin: Skin is warm and dry. No rash noted.  Psychiatric: She has a normal mood and affect. Her behavior is normal.  Nursing note and vitals reviewed.   ED Course  Procedures (including critical care time) Labs Review Labs Reviewed  CBC WITH DIFFERENTIAL - Abnormal; Notable for the following:    RBC 5.62 (*)    Hemoglobin 15.1 (*)    Neutrophils Relative % 36 (*)    Lymphocytes Relative 55 (*)    All other components within normal limits  COMPREHENSIVE METABOLIC PANEL - Abnormal; Notable for the following:    Sodium 133 (*)    Potassium 2.8 (*)    Chloride 92 (*)    Glucose, Bld 107 (*)    All other components within normal limits  LIPASE, BLOOD - Abnormal; Notable for the following:    Lipase 73 (*)    All other components within normal limits  URINALYSIS, ROUTINE W REFLEX MICROSCOPIC    Imaging Review Ct Abdomen Pelvis W Contrast  06/02/2014   CLINICAL  DATA:  Epigastric abdominal pain.  EXAM: CT ABDOMEN AND PELVIS WITH CONTRAST  TECHNIQUE: Multidetector CT imaging of the abdomen and pelvis was performed using the standard protocol following bolus administration of intravenous contrast.  CONTRAST:  116mL OMNIPAQUE IOHEXOL 300 MG/ML  SOLN  COMPARISON:  CT 01/05/2013  FINDINGS: There is a stable 5 mm pleural-based nodule in the left lower lobe which is unchanged and likely benign. There is a 4 mm nodule in left lower lobe on sequence 6, image 4 which was not imaged on the previous examination. There is a stable punctate nodule left lower lobe on image 12. Additional punctate nodular densities in the left lower lobe. Negative for free air.  Normal appearance of liver, portal venous system, gallbladder, spleen, pancreas and kidneys. Probable small cysts in the left kidney. Stable left adrenal nodule measuring 2.4 cm.  Stable 3.9 cm nodule involving the right adrenal gland. These nodules cannot be definitively evaluated on this post contrast CT but suspect a benign etiology based on the stability.  Atherosclerotic calcifications involving the abdominal aorta and visceral arteries without aneurysm. No significant free fluid or lymphadenopathy. Surgical clips in the pelvis bilaterally. The uterus has been removed. Fluid in the urinary bladder. No acute abnormality involving the small bowel, colon or the appendix.  There is a ventral hernia adjacent to the transverse colon which contains fat. This hernia does not contain bowel but there is increased soft tissue thickening or edema compared to the previous examination.  No acute bone abnormality.  IMPRESSION: Supraumbilical ventral hernia containing fat. Increased edema or soft tissue thickening involving this ventral hernia compared to the previous examination. Recommend clinical correlation in this area.  Stable bilateral adrenal nodules. These are indeterminate on this post contrast examination. Suspect these represent a  benign etiology such as adenomas based on the stability. These could be more definitively evaluated with a non contrast CT or MRI.  Small nodular densities at the left lung base. Some of these are stable from 2014 but others were not visualized. If the patient is at high risk for bronchogenic carcinoma, follow-up chest CT at 1 year is recommended. If the patient is at low risk, no follow-up is needed. This recommendation follows the consensus statement: Guidelines for Management of Small Pulmonary Nodules Detected on CT Scans: A Statement from the [employer] as published in Radiology 2005; 237:395-400.   Electronically Signed   By: Markus Daft M.D.   On: 06/02/2014 20:04   US Abdomen Limited  06/02/2014   CLINICAL DATA:  Right upper quadrant pain, abdominal pain  EXAM: US ABDOMEN LIMITED - RIGHT UPPER QUADRANT  COMPARISON:  CT scan 01/05/2013  FINDINGS: Gallbladder:  No gallstones or wall thickening visualized. No sonographic Murphy sign noted.  Common bile duct:  Diameter: 3.8 mm in diameter.  Liver:  No focal lesion identified. Mild heterogeneous liver echogenicity without intrahepatic biliary ductal dilatation. Please note the patient is tender when scanning pancreatic region.  IMPRESSION: 1. No gallstones are noted within gallbladder.  Normal CBD. 2. Mild heterogeneous review echogenicity without intrahepatic biliary ductal dilatation. Please note the patient is tender when scanning pancreatic region.   Electronically Signed   By: Lahoma Crocker M.D.   On: 06/02/2014 16:57  All radiology studies independently viewed by me.      EKG Interpretation None      MDM   Final diagnoses:  RUQ abdominal pain  Abdominal pain, epigastric  Ventral hernia without obstruction or gangrene    58 yo female with about 3 weeks of upper abdominal pain.  She has been trying sucralfate and protonix (once daily) without improvement. Nontoxic on exam.  Abd soft but tender mostly in RUQ.  Plan ultrasound and labs .  GI cocktail helped the last time she was in ED, so will try that again.   GI cocktail did not help pain as much as last time.  She continued to have pain after her negative ultrasound.  CT scan showed inflammation around a ventral fat containing hernia.  This hernia was reducible on exam.  I discussed her case with Dr. Georgette Dover in order to secure her close follow up in general surgery clinic.  Pain had resolved at time of discharge.    Artis Delay, MD 06/03/14 [id number]

## 2014-06-02 NOTE — ED Notes (Signed)
PT REFUSING ALL TESTS.  When asked why pt came if she did not want anything done, pt states "I just wanna know if I'm getting sicker".  Importance of blood tests reiterated.  Pt resolute in her decision.

## 2014-06-02 NOTE — ED Notes (Signed)
Patient A&O x4, respirations even and unlabored, skin warm and dry, family at bedside. Patient requesting pain medication. Informed patient I will ask EDP for same. Denies other needs at this time.

## 2014-06-02 NOTE — ED Notes (Signed)
Pt states that she was here on the 7th for abd pain and nausea. States that she doesn't feel any better.  Pt states that she is having mid abd pain w/ nausea, no vomiting or diarrhea.

## 2014-06-02 NOTE — Discharge Instructions (Signed)
Abdominal Pain  Many things can cause abdominal pain. Usually, abdominal pain is not caused by a disease and will improve without treatment. It can often be observed and treated at home. Your health care provider will do a physical exam and possibly order blood tests and X-rays to help determine the seriousness of your pain. However, in many cases, more time must pass before a clear cause of the pain can be found. Before that point, your health care provider may not know if you need more testing or further treatment.  HOME CARE INSTRUCTIONS   Monitor your abdominal pain for any changes. The following actions may help to alleviate any discomfort you are experiencing:   Only take over-the-counter or prescription medicines as directed by your health care provider.   Do not take laxatives unless directed to do so by your health care provider.   Try a clear liquid diet (broth, tea, or water) as directed by your health care provider. Slowly move to a bland diet as tolerated.  SEEK MEDICAL CARE IF:   You have unexplained abdominal pain.   You have abdominal pain associated with nausea or diarrhea.   You have pain when you urinate or have a bowel movement.   You experience abdominal pain that wakes you in the night.   You have abdominal pain that is worsened or improved by eating food.   You have abdominal pain that is worsened with eating fatty foods.   You have a fever.  SEEK IMMEDIATE MEDICAL CARE IF:    Your pain does not go away within 2 hours.   You keep throwing up (vomiting).   Your pain is felt only in portions of the abdomen, such as the right side or the left lower portion of the abdomen.   You pass bloody or black tarry stools.  MAKE SURE YOU:   Understand these instructions.    Will watch your condition.    Will get help right away if you are not doing well or get worse.   Document Released: 03/14/2005 Document Revised: 06/09/2013 Document Reviewed: 02/11/2013  [employer] Patient Information  2015 [employer]. This information is not intended to replace advice given to you by your health care provider. Make sure you discuss any questions you have with your health care provider.        Ventral Hernia  A ventral hernia (also called an incisional hernia) is a hernia that occurs at the site of a previous surgical cut (incision) in the abdomen. The abdominal wall spans from your lower chest down to your pelvis. If the abdominal wall is weakened from a surgical incision, a hernia can occur. A hernia is a bulge of bowel or muscle tissue pushing out on the weakened part of the abdominal wall. Ventral hernias can get bigger from straining or lifting.  Obese and older people are at higher risk for a ventral hernia. People who develop infections after surgery or require repeat incisions at the same site on the abdomen are also at increased risk.  CAUSES   A ventral hernia occurs because of weakness in the abdominal wall at an incision site.   SYMPTOMS   Common symptoms include:   A visible bulge or lump on the abdominal wall.   Pain or tenderness around the lump.   Increased discomfort if you cough or make a sudden movement.  If the hernia has blocked part of the intestine, a serious complication can occur (incarcerated or strangulated hernia). This can become   a problem that requires emergency surgery because the blood flow to the blocked intestine may be cut off. Symptoms may include:   Feeling sick to your stomach (nauseous).   Throwing up (vomiting).   Stomach swelling (distention) or bloating.   Fever.   Rapid heartbeat.  DIAGNOSIS   Your health care provider will take a medical history and perform a physical exam. Various tests may be ordered, such as:   Blood tests.   Urine tests.   Ultrasonography.   X-rays.   Computed tomography (CT).  TREATMENT   Watchful waiting may be all that is needed for a smaller hernia that does not cause symptoms. Your health care provider may recommend the use of a  supportive belt (truss) that helps to keep the abdominal wall intact. For larger hernias or those that cause pain, surgery to repair the hernia is usually recommended. If a hernia becomes strangulated, emergency surgery needs to be done right away.  HOME CARE INSTRUCTIONS   Avoid putting pressure or strain on the abdominal area.   Avoid heavy lifting.   Use good body positioning for physical tasks. Ask your health care provider about proper body positioning.   Use a supportive belt as directed by your health care provider.   Maintain a healthy weight.   Eat foods that are high in fiber, such as whole grains, fruits, and vegetables. Fiber helps prevent difficult bowel movements (constipation).   Drink enough fluids to keep your urine clear or pale yellow.   Follow up with your health care provider as directed.  SEEK MEDICAL CARE IF:    Your hernia seems to be getting larger or more painful.  SEEK IMMEDIATE MEDICAL CARE IF:    You have abdominal pain that is sudden and sharp.   Your pain becomes severe.   You have repeated vomiting.   You are sweating a lot.   You notice a rapid heartbeat.   You develop a fever.  MAKE SURE YOU:    Understand these instructions.   Will watch your condition.   Will get help right away if you are not doing well or get worse.  Document Released: 05/21/2012 Document Revised: 10/19/2013 Document Reviewed: 05/21/2012  [employer] Patient Information 2015 [employer]. This information is not intended to replace advice given to you by your health care provider. Make sure you discuss any questions you have with your health care provider.

## 2014-06-23 ENCOUNTER — Other Ambulatory Visit (INDEPENDENT_AMBULATORY_CARE_PROVIDER_SITE_OTHER): Payer: Self-pay | Admitting: General Surgery

## 2014-06-29 NOTE — Patient Instructions (Addendum)
Sarah [name]  06/29/2014   Your procedure is scheduled on: Tuesday January 19th, 2016  Report to [facility] Main  Entrance and follow signs to               [place] at 800 AM.  Call this number if you have problems the morning of surgery [phone]   Remember:  Do not eat food or drink liquids :After Midnight.     Take these medicines the morning of surgery with A SIP OF WATER: amlodipine, albuterol inhaler if needed, use and bring and leave inhaler with your spouse, pantaprazole, allopurinol, hydrocoodne if needed                               You may not have any metal on your body including hair pins and              piercings  Do not wear jewelry, make-up, lotions, powders or perfumes.             Do not wear nail polish.  Do not shave  48 hours prior to surgery.              Men may shave face and neck.   Do not bring valuables to the hospital. [place].  Contacts, dentures or bridgework may not be worn into surgery.  Leave suitcase in the car. After surgery it may be brought to your room.     Patients discharged the day of surgery will not be allowed to drive home.  Name and phone number of your driver:  Special Instructions: N/A              Please read over the following fact sheets you were given: _____________________________________________________________________             [facility] - Preparing for Surgery Before surgery, you can play an important role.  Because skin is not sterile, your skin needs to be as free of germs as possible.  You can reduce the number of germs on your skin by washing with CHG (chlorahexidine gluconate) soap before surgery.  CHG is an antiseptic cleaner which kills germs and bonds with the skin to continue killing germs even after washing. Please DO NOT use if you have an allergy to CHG or antibacterial soaps.  If your skin becomes reddened/irritated  stop using the CHG and inform your nurse when you arrive at Short Stay. Do not shave (including legs and underarms) for at least 48 hours prior to the first CHG shower.  You may shave your face/neck. Please follow these instructions carefully:  1.  Shower with CHG Soap the night before surgery and the  morning of Surgery.  2.  If you choose to wash your hair, wash your hair first as usual with your  normal  shampoo.  3.  After you shampoo, rinse your hair and body thoroughly to remove the  shampoo.                           4.  Use CHG as you would any other liquid soap.  You can apply chg directly  to the skin and wash  Gently with a scrungie or clean washcloth.  5.  Apply the CHG Soap to your body ONLY FROM THE NECK DOWN.   Do not use on face/ open                           Wound or open sores. Avoid contact with eyes, ears mouth and genitals (private parts).                       Wash face,  Genitals (private parts) with your normal soap.             6.  Wash thoroughly, paying special attention to the area where your surgery  will be performed.  7.  Thoroughly rinse your body with warm water from the neck down.  8.  DO NOT shower/wash with your normal soap after using and rinsing off  the CHG Soap.                9.  Pat yourself dry with a clean towel.            10.  Wear clean pajamas.            11.  Place clean sheets on your bed the night of your first shower and do not  sleep with pets. Day of Surgery : Do not apply any lotions/deodorants the morning of surgery.  Please wear clean clothes to the hospital/surgery center.  FAILURE TO FOLLOW THESE INSTRUCTIONS MAY RESULT IN THE CANCELLATION OF YOUR SURGERY PATIENT SIGNATURE_________________________________  NURSE SIGNATURE__________________________________  ________________________________________________________________________

## 2014-07-01 ENCOUNTER — Ambulatory Visit (HOSPITAL_COMMUNITY)
Admission: RE | Admit: 2014-07-01 | Discharge: 2014-07-01 | Disposition: A | Discharge status: Home / Self Care | Payer: Medicare Other | Source: Ambulatory Visit | Attending: General Surgery | Admitting: General Surgery

## 2014-07-01 ENCOUNTER — Encounter (HOSPITAL_COMMUNITY)
Admission: RE | Admit: 2014-07-01 | Discharge: 2014-07-01 | Disposition: A | Discharge status: Home / Self Care | Payer: Medicare Other | Source: Ambulatory Visit | Attending: General Surgery | Admitting: General Surgery

## 2014-07-01 ENCOUNTER — Encounter (HOSPITAL_COMMUNITY): Payer: Self-pay

## 2014-07-01 DIAGNOSIS — K439 Ventral hernia without obstruction or gangrene: Secondary | ICD-10-CM | POA: Insufficient documentation

## 2014-07-01 DIAGNOSIS — F17210 Nicotine dependence, cigarettes, uncomplicated: Secondary | ICD-10-CM | POA: Diagnosis not present

## 2014-07-01 DIAGNOSIS — Z01818 Encounter for other preprocedural examination: Secondary | ICD-10-CM | POA: Insufficient documentation

## 2014-07-01 DIAGNOSIS — I509 Heart failure, unspecified: Secondary | ICD-10-CM | POA: Insufficient documentation

## 2014-07-01 DIAGNOSIS — R918 Other nonspecific abnormal finding of lung field: Secondary | ICD-10-CM | POA: Insufficient documentation

## 2014-07-01 DIAGNOSIS — J45909 Unspecified asthma, uncomplicated: Secondary | ICD-10-CM | POA: Insufficient documentation

## 2014-07-01 DIAGNOSIS — I10 Essential (primary) hypertension: Secondary | ICD-10-CM | POA: Diagnosis not present

## 2014-07-01 HISTORY — DX: Unspecified osteoarthritis, unspecified site: M19.90

## 2014-07-01 HISTORY — DX: Headache: R51

## 2014-07-01 HISTORY — DX: Headache, unspecified: R51.9

## 2014-07-01 LAB — CBC WITH DIFFERENTIAL/PLATELET
BASOS PCT: 0 % (ref 0–1)
Basophils Absolute: 0 10*3/uL (ref 0.0–0.1)
Eosinophils Absolute: 0.1 10*3/uL (ref 0.0–0.7)
Eosinophils Relative: 1 % (ref 0–5)
HEMATOCRIT: 39.8 % (ref 36.0–46.0)
Hemoglobin: 13.3 g/dL (ref 12.0–15.0)
Lymphocytes Relative: 41 % (ref 12–46)
Lymphs Abs: 3.1 10*3/uL (ref 0.7–4.0)
MCH: 26.9 pg (ref 26.0–34.0)
MCHC: 33.4 g/dL (ref 30.0–36.0)
MCV: 80.4 fL (ref 78.0–100.0)
Monocytes Absolute: 0.4 10*3/uL (ref 0.1–1.0)
Monocytes Relative: 5 % (ref 3–12)
Neutro Abs: 3.9 10*3/uL (ref 1.7–7.7)
Neutrophils Relative %: 53 % (ref 43–77)
PLATELETS: 292 10*3/uL (ref 150–400)
RBC: 4.95 MIL/uL (ref 3.87–5.11)
RDW: 15 % (ref 11.5–15.5)
WBC: 7.5 10*3/uL (ref 4.0–10.5)

## 2014-07-01 LAB — COMPREHENSIVE METABOLIC PANEL
ALT: 11 U/L (ref 0–35)
ANION GAP: 10 (ref 5–15)
AST: 13 U/L (ref 0–37)
Albumin: 4.2 g/dL (ref 3.5–5.2)
Alkaline Phosphatase: 79 U/L (ref 39–117)
BUN: 17 mg/dL (ref 6–23)
CHLORIDE: 98 meq/L (ref 96–112)
CO2: 29 mmol/L (ref 19–32)
Calcium: 9.8 mg/dL (ref 8.4–10.5)
Creatinine, Ser: 0.72 mg/dL (ref 0.50–1.10)
GFR calc Af Amer: >90 mL/min (ref >90)
Glucose, Bld: 89 mg/dL (ref 70–99)
Potassium: 3.2 mmol/L — ABNORMAL LOW (ref 3.5–5.1)
Sodium: 137 mmol/L (ref 135–145)
Total Bilirubin: 0.6 mg/dL (ref 0.3–1.2)
Total Protein: 8 g/dL (ref 6.0–8.3)

## 2014-07-01 LAB — URINALYSIS, ROUTINE W REFLEX MICROSCOPIC
Bilirubin Urine: NEGATIVE
GLUCOSE, UA: NEGATIVE mg/dL
Hgb urine dipstick: NEGATIVE
KETONES UR: NEGATIVE mg/dL
LEUKOCYTES UA: NEGATIVE
Nitrite: NEGATIVE
PH: 5 (ref 5.0–8.0)
PROTEIN: NEGATIVE mg/dL
Specific Gravity, Urine: 1.024 (ref 1.005–1.030)
UROBILINOGEN UA: 1 mg/dL (ref 0.0–1.0)

## 2014-07-01 NOTE — Progress Notes (Signed)
Chest xray results routed to dr Lamount Cohen by epic

## 2014-07-01 NOTE — Progress Notes (Signed)
Called dr Dalbert Batman office and spoke with Flossie Buffy, she will make dr Dalbert Batman aware of chest xray results from 07-01-14

## 2014-07-02 NOTE — H&P (Signed)
Sarah [name]  Location: [facility] Patient #: [id number] DOB: [birth year] Married / Language: English / Race: Black or African American Female      History of Present Illness  Patient words: possible hernia.  The patient is a 59 year old female who presents with an incisional hernia. This is a 59 year old [place] American female, apparently referred by Dr. Artis Delay in the [facility] for evaluation of a painful ventral hernia. The patient has been known to have an incarcerated ventral hernia in the midline above the umbilicus for at least 18 months. She was hospitalized in July 2014 for acute pancreatitis which resolved after 3 days. Dr. Rush Farmer saw her in the hospital and noted the incarcerated ventral hernia above the umbilicus containing fat only. It was felt that her pain at that time was due to the pancreatitis. She was advised that she could be followed up in our office electively. She has not been seen since that time. She went to the emergency department on June 03, 2014 with nausea and abdominal pain. She was not vomiting. Admitted it had been going on for 3 weeks. She continued to have bowel movements. Was noted to be tender above the umbilicus. Gallbladder ultrasound was normal. CT scan shows a ventral hernia containing fat above the umbilicus. Defect is probably less than 3 cm. Some edema present. No bowel involvement. Stable bilateral adrenal nodules. Nodular densities of the lung base follow-up in 1 year recommended. The patient's medical care is delivered at [facility]. She is here with her husband. They're both disabled, according to her. Comorbidities include obesity, ongoing tobacco abuse, hypertension, asthma, borderline diabetes. Former to alcohol abuse, she states she quit 10 years ago. She states she is that she is having a lot of pain and wants to go ahead with scheduling of surgery. She excepts her risks. I told her that she can  lower her risk of recurrence and infection if she would lose weight and stopped smoking for 2 months. I discussed the indications, details, techniques, and numerous risk of laparoscopic repair of incarcerated ventral hernia with mesh, possible open repair. She is aware the risks of bleeding, infection, injury to adjacent organs with major reconstructive surgery, high risk of recurrence due to her smoking, increased risk of infection due to obesity and smoking, wound healing problems, cardiac, pulmonary, and thromboembolic problems. She understands these issues. All of her questions are answered. She agrees with this plan.   Other Problems  Alcohol Abuse Arthritis Asthma Back Pain Chest pain Diabetes Mellitus Gastroesophageal Reflux Disease Hemorrhoids High blood pressure Pancreatitis  Past Surgical History  Hemorrhoidectomy Hysterectomy (not due to cancer) - Complete Knee Surgery Left.  Diagnostic Studies History  Colonoscopy never Mammogram 1-3 years ago Pap Smear 1-5 years ago  Allergies  Penicillins Ibuprofen *ANALGESICS - ANTI-INFLAMMATORY* Aspirin *ANALGESICS - NonNarcotic*  Medication History  Hydrocodone/Acetaminophen (Oral) Active. Albuterol Sulfate HFA (108 (90 Base)MCG/ACT Aerosol Soln, Inhalation) Active.  Social History Alcohol use Remotely quit alcohol use. Caffeine use Coffee. No drug use Tobacco use Current every day smoker.  Family History Briant Cedar, [place]; 06/23/2014 4:17 PM) Alcohol Abuse Family Members In General, Sister. Arthritis Sister. Cerebrovascular Accident Family Members In General, Mother. Colon Cancer Family Members In General. Diabetes Mellitus Mother. Hypertension Sister.  Pregnancy / Birth History  Age at menarche 11 years. Age of menopause 46-50 Irregular periods Para 0  Review of Systems General Present- Appetite Loss and Fatigue. Not Present- Chills, Fever, Night Sweats, Weight Gain and  Weight Loss. Skin Not Present- Change in Wart/Mole, Dryness, Hives, Jaundice, New Lesions, Non-Healing Wounds, Rash and Ulcer. HEENT Present- Earache, Seasonal Allergies and Wears glasses/contact lenses. Not Present- Hearing Loss, Hoarseness, Nose Bleed, Oral Ulcers, Ringing in the Ears, Sinus Pain, Sore Throat, Visual Disturbances and Yellow Eyes. Respiratory Present- Difficulty Breathing and Wheezing. Not Present- Bloody sputum, Chronic Cough and Snoring. Breast Present- Breast Pain. Not Present- Breast Mass, Nipple Discharge and Skin Changes. Cardiovascular Present- Leg Cramps, Shortness of Breath and Swelling of Extremities. Not Present- Chest Pain, Difficulty Breathing Lying Down, Palpitations and Rapid Heart Rate. Gastrointestinal Present- Bloating, Excessive gas, Gets full quickly at meals, Hemorrhoids, Indigestion and Nausea. Not Present- Abdominal Pain, Bloody Stool, Change in Bowel Habits, Chronic diarrhea, Constipation, Difficulty Swallowing, Rectal Pain and Vomiting. Female Genitourinary Not Present- Frequency, Nocturia, Painful Urination, Pelvic Pain and Urgency. Musculoskeletal Present- Muscle Pain. Not Present- Back Pain, Joint Pain, Joint Stiffness, Muscle Weakness and Swelling of Extremities. Neurological Present- Trouble walking. Not Present- Decreased Memory, Fainting, Headaches, Numbness, Seizures, Tingling, Tremor and Weakness. Psychiatric Not Present- Anxiety, Bipolar, Change in Sleep Pattern, Depression, Fearful and Frequent crying. Endocrine Present- Hair Changes, Hot flashes and New Diabetes. Not Present- Cold Intolerance, Excessive Hunger and Heat Intolerance. Hematology Not Present- Easy Bruising, Excessive bleeding, Gland problems, HIV and Persistent Infections.   Vitals   Weight: 227.13 lb Height: 68in Body Surface Area: 2.22 m Body Mass Index: 34.53 kg/m Temp.: 98.38F  Pulse: 108 (Regular)  BP: 160/60 (Sitting, Left Arm, Standard)    Physical Exam   General Mental Status-Alert. General Appearance-Consistent with stated age. Hydration-Well hydrated. Voice-Normal. Note: Obese. BMI 34. Alert and cooperative. Insight fair. Slight fatigue. Husband is with her but he speaks very little.   Head and Neck Head-normocephalic, atraumatic with no lesions or palpable masses. Trachea-midline. Thyroid Gland Characteristics - normal size and consistency.  Eye Eyeball - Bilateral-Extraocular movements intact. Sclera/Conjunctiva - Bilateral-No scleral icterus.  Chest and Lung Exam Chest and lung exam reveals -quiet, even and easy respiratory effort with no use of accessory muscles and on auscultation, normal breath sounds, no adventitious sounds and normal vocal resonance. Inspection Chest Wall - Normal. Back - normal.  Breast Breast - Left-Symmetric, Non Tender, No Biopsy scars, no Dimpling, No Inflammation, No Lumpectomy scars, No Mastectomy scars, No Peau d' Orange. Breast - Right-Symmetric, Non Tender, No Biopsy scars, no Dimpling, No Inflammation, No Lumpectomy scars, No Mastectomy scars, No Peau d' Orange. Breast Lump-No Palpable Breast Mass.  Cardiovascular Cardiovascular examination reveals -normal heart sounds, regular rate and rhythm with no murmurs and normal pedal pulses bilaterally.  Abdomen Palpation/Percussion Palpation and Percussion of the abdomen reveal - Soft, Non Tender, No Rebound tenderness, No Rigidity (guarding) and No hepatosplenomegaly. Auscultation Auscultation of the abdomen reveals - Bowel sounds normal. Note: Abdomen is somewhat obese. Tender incarcerated 5 cm. hernia mass in midline above umbilicus. Skin looks healthy. Recent reproducible tender here. Soft and nontender elsewhere. Not distended. Lower midline scar well healed.   Neurologic Neurologic evaluation reveals -alert and oriented x 3 with no impairment of recent or remote memory. Mental  Status-Normal.  Musculoskeletal Normal Exam - Left-Upper Extremity Strength Normal and Lower Extremity Strength Normal. Normal Exam - Right-Upper Extremity Strength Normal and Lower Extremity Strength Normal.  Lymphatic Head & Neck  General Head & Neck Lymphatics: Bilateral - Description - Normal. Axillary  General Axillary Region: Bilateral - Description - Normal. Tenderness - Non Tender. Femoral & Inguinal  Generalized Femoral & Inguinal Lymphatics: Bilateral - Description -  Normal. Tenderness - Non Tender.    Assessment & Plan  INCARCERATED VENTRAL HERNIA (552.20  K46.0) Current Plans  Schedule for Surgery You have a ventral hernia in your abdomen above your umbilicus. There is fatty tissue caught in this. This is the source of your pain. Because of your smoking and your obesity, you are at increased risk for wound infection and for recurrence of the hernia. You have been urged to stop smoking for 6-8 weeks and to lose weight. you stated you cannot wait due to the pain you will be scheduled for laparoscopic repair of your incarcerated ventral hernia with mesh, possible open repair in the future We have discussed techniques and risks of this surgery in great detail. TOBACCO ABUSE (305.1  Z72.0) Impression: Strongly counseled to quit. She states she doesn't know if she can do this or not. Having too much pain to go through smoking cessation at [facility]   BMI 34.0-34.9,ADULT (V85.34  Z68.34) Impression: Strongly encouraged to lose weight  HISTORY OF ALCOHOL ABUSE (305.03  Z87.898)  HISTORY OF ACUTE PANCREATITIS (V12.79  Z87.19)  HYPERTENSION, BENIGN (401.1  I10)  HISTORY OF HYSTERECTOMY WITH OOPHORECTOMY (V88.01  Z90.710) Impression: Benign disease.  BORDERLINE DIABETES (790.29  R73.09)    Edsel Petrin. Dalbert Batman, M.D., [facility] General and Minimally invasive Surgery Breast and Colorectal Surgery Office:    [phone] Pager:   [phone]

## 2014-07-06 ENCOUNTER — Emergency Department (HOSPITAL_COMMUNITY)
Admission: EM | Admit: 2014-07-06 | Discharge: 2014-07-06 | Disposition: A | Discharge status: Home / Self Care | Payer: Medicare Other | Attending: Emergency Medicine | Admitting: Emergency Medicine

## 2014-07-06 ENCOUNTER — Emergency Department (HOSPITAL_COMMUNITY): Payer: Medicare Other

## 2014-07-06 ENCOUNTER — Encounter (HOSPITAL_COMMUNITY): Payer: Self-pay | Admitting: *Deleted

## 2014-07-06 ENCOUNTER — Other Ambulatory Visit: Payer: Self-pay

## 2014-07-06 DIAGNOSIS — Z880 Allergy status to penicillin: Secondary | ICD-10-CM | POA: Diagnosis not present

## 2014-07-06 DIAGNOSIS — Z8701 Personal history of pneumonia (recurrent): Secondary | ICD-10-CM | POA: Insufficient documentation

## 2014-07-06 DIAGNOSIS — J45909 Unspecified asthma, uncomplicated: Secondary | ICD-10-CM | POA: Insufficient documentation

## 2014-07-06 DIAGNOSIS — I10 Essential (primary) hypertension: Secondary | ICD-10-CM | POA: Diagnosis not present

## 2014-07-06 DIAGNOSIS — Z720 Tobacco use: Secondary | ICD-10-CM | POA: Insufficient documentation

## 2014-07-06 DIAGNOSIS — H81399 Other peripheral vertigo, unspecified ear: Secondary | ICD-10-CM | POA: Diagnosis not present

## 2014-07-06 DIAGNOSIS — M1990 Unspecified osteoarthritis, unspecified site: Secondary | ICD-10-CM | POA: Diagnosis not present

## 2014-07-06 DIAGNOSIS — Z8742 Personal history of other diseases of the female genital tract: Secondary | ICD-10-CM | POA: Diagnosis not present

## 2014-07-06 DIAGNOSIS — R079 Chest pain, unspecified: Secondary | ICD-10-CM | POA: Diagnosis present

## 2014-07-06 DIAGNOSIS — E119 Type 2 diabetes mellitus without complications: Secondary | ICD-10-CM | POA: Insufficient documentation

## 2014-07-06 DIAGNOSIS — K219 Gastro-esophageal reflux disease without esophagitis: Secondary | ICD-10-CM | POA: Diagnosis not present

## 2014-07-06 DIAGNOSIS — J209 Acute bronchitis, unspecified: Secondary | ICD-10-CM

## 2014-07-06 DIAGNOSIS — Z791 Long term (current) use of non-steroidal anti-inflammatories (NSAID): Secondary | ICD-10-CM | POA: Insufficient documentation

## 2014-07-06 DIAGNOSIS — K2970 Gastritis, unspecified, without bleeding: Secondary | ICD-10-CM | POA: Diagnosis not present

## 2014-07-06 DIAGNOSIS — Z79899 Other long term (current) drug therapy: Secondary | ICD-10-CM | POA: Insufficient documentation

## 2014-07-06 DIAGNOSIS — R42 Dizziness and giddiness: Secondary | ICD-10-CM

## 2014-07-06 LAB — CBC WITH DIFFERENTIAL/PLATELET
Basophils Absolute: 0 10*3/uL (ref 0.0–0.1)
Basophils Relative: 0 % (ref 0–1)
Eosinophils Absolute: 0.1 10*3/uL (ref 0.0–0.7)
Eosinophils Relative: 2 % (ref 0–5)
HCT: 37.7 % (ref 36.0–46.0)
Hemoglobin: 13 g/dL (ref 12.0–15.0)
LYMPHS ABS: 3.1 10*3/uL (ref 0.7–4.0)
LYMPHS PCT: 45 % (ref 12–46)
MCH: 27 pg (ref 26.0–34.0)
MCHC: 34.5 g/dL (ref 30.0–36.0)
MCV: 78.4 fL (ref 78.0–100.0)
MONO ABS: 0.4 10*3/uL (ref 0.1–1.0)
Monocytes Relative: 6 % (ref 3–12)
NEUTROS ABS: 3.2 10*3/uL (ref 1.7–7.7)
Neutrophils Relative %: 47 % (ref 43–77)
Platelets: 292 10*3/uL (ref 150–400)
RBC: 4.81 MIL/uL (ref 3.87–5.11)
RDW: 15.2 % (ref 11.5–15.5)
WBC: 6.8 10*3/uL (ref 4.0–10.5)

## 2014-07-06 LAB — COMPREHENSIVE METABOLIC PANEL
ALT: 10 U/L (ref 0–35)
AST: 13 U/L (ref 0–37)
Albumin: 3.9 g/dL (ref 3.5–5.2)
Alkaline Phosphatase: 76 U/L (ref 39–117)
Anion gap: 11 (ref 5–15)
BUN: 10 mg/dL (ref 6–23)
CO2: 28 mmol/L (ref 19–32)
CREATININE: 0.71 mg/dL (ref 0.50–1.10)
Calcium: 9.9 mg/dL (ref 8.4–10.5)
Chloride: 98 mEq/L (ref 96–112)
GFR calc non Af Amer: >90 mL/min (ref >90)
Glucose, Bld: 70 mg/dL (ref 70–99)
POTASSIUM: 3.4 mmol/L — AB (ref 3.5–5.1)
SODIUM: 137 mmol/L (ref 135–145)
Total Bilirubin: 0.8 mg/dL (ref 0.3–1.2)
Total Protein: 7.7 g/dL (ref 6.0–8.3)

## 2014-07-06 LAB — I-STAT TROPONIN, ED: TROPONIN I, POC: 0 ng/mL (ref 0.00–0.08)

## 2014-07-06 MED ORDER — MECLIZINE HCL 25 MG PO TABS: 25.0000 mg | ORAL_TABLET | Freq: Three times a day (TID) | ORAL | Status: DC | PRN

## 2014-07-06 MED ORDER — AZITHROMYCIN 250 MG PO TABS: ORAL_TABLET | ORAL | Status: DC

## 2014-07-06 NOTE — Discharge Instructions (Signed)
Zithromax as prescribed.  Meclizine as prescribed as needed for dizziness.  Return to the emergency department if you develop any new and concerning symptoms.   Dizziness Dizziness is a common problem. It is a feeling of unsteadiness or light-headedness. You may feel like you are about to faint. Dizziness can lead to injury if you stumble or fall. A person of any age group can suffer from dizziness, but dizziness is more common in older adults. CAUSES  Dizziness can be caused by many different things, including:  Middle ear problems.  Standing for too long.  Infections.  An allergic reaction.  Aging.  An emotional response to something, such as the sight of blood.  Side effects of medicines.  Tiredness.  Problems with circulation or blood pressure.  Excessive use of alcohol or medicines, or illegal drug use.  Breathing too fast (hyperventilation).  An irregular heart rhythm (arrhythmia).  A low red blood cell count (anemia).  Pregnancy.  Vomiting, diarrhea, fever, or other illnesses that cause body fluid loss (dehydration).  Diseases or conditions such as Parkinson's disease, high blood pressure (hypertension), diabetes, and thyroid problems.  Exposure to extreme heat. DIAGNOSIS  Your health care provider will ask about your symptoms, perform a physical exam, and perform an electrocardiogram (ECG) to record the electrical activity of your heart. Your health care provider may also perform other heart or blood tests to determine the cause of your dizziness. These may include:  Transthoracic echocardiogram (TTE). During echocardiography, sound waves are used to evaluate how blood flows through your heart.  Transesophageal echocardiogram (TEE).  Cardiac monitoring. This allows your health care provider to monitor your heart rate and rhythm in real time.  Holter monitor. This is a portable device that records your heartbeat and can help diagnose heart arrhythmias. It  allows your health care provider to track your heart activity for several days if needed.  Stress tests by exercise or by giving medicine that makes the heart beat faster. TREATMENT  Treatment of dizziness depends on the cause of your symptoms and can vary greatly. HOME CARE INSTRUCTIONS   Drink enough fluids to keep your urine clear or pale yellow. This is especially important in very hot weather. In older adults, it is also important in cold weather.  Take your medicine exactly as directed if your dizziness is caused by medicines. When taking blood pressure medicines, it is especially important to get up slowly.  Rise slowly from chairs and steady yourself until you feel okay.  In the morning, first sit up on the side of the bed. When you feel okay, stand slowly while holding onto something until you know your balance is fine.  Move your legs often if you need to stand in one place for a long time. Tighten and relax your muscles in your legs while standing.  Have someone stay with you for 1-2 days if dizziness continues to be a problem. Do this until you feel you are well enough to stay alone. Have the person call your health care provider if he or she notices changes in you that are concerning.  Do not drive or use heavy machinery if you feel dizzy.  Do not drink alcohol. SEEK IMMEDIATE MEDICAL CARE IF:   Your dizziness or light-headedness gets worse.  You feel nauseous or vomit.  You have problems talking, walking, or using your arms, hands, or legs.  You feel weak.  You are not thinking clearly or you have trouble forming sentences. It may take  a friend or family member to notice this.  You have chest pain, abdominal pain, shortness of breath, or sweating.  Your vision changes.  You notice any bleeding.  You have side effects from medicine that seems to be getting worse rather than better. MAKE SURE YOU:   Understand these instructions.  Will watch your  condition.  Will get help right away if you are not doing well or get worse. Document Released: 11/28/2000 Document Revised: 06/09/2013 Document Reviewed: 12/22/2010 [facility] Patient Information 2015 [place], [place]. This information is not intended to replace advice given to you by your health care provider. Make sure you discuss any questions you have with your health care provider.  Acute Bronchitis Bronchitis is inflammation of the airways that extend from the windpipe into the lungs (bronchi). The inflammation often causes mucus to develop. This leads to a cough, which is the most common symptom of bronchitis.  In acute bronchitis, the condition usually develops suddenly and goes away over time, usually in a couple weeks. Smoking, allergies, and asthma can make bronchitis worse. Repeated episodes of bronchitis may cause further lung problems.  CAUSES Acute bronchitis is most often caused by the same virus that causes a cold. The virus can spread from person to person (contagious) through coughing, sneezing, and touching contaminated objects. SIGNS AND SYMPTOMS   Cough.   Fever.   Coughing up mucus.   Body aches.   Chest congestion.   Chills.   Shortness of breath.   Sore throat.  DIAGNOSIS  Acute bronchitis is usually diagnosed through a physical exam. Your health care provider will also ask you questions about your medical history. Tests, such as chest X-rays, are sometimes done to rule out other conditions.  TREATMENT  Acute bronchitis usually goes away in a couple weeks. Oftentimes, no medical treatment is necessary. Medicines are sometimes given for relief of fever or cough. Antibiotic medicines are usually not needed but may be prescribed in certain situations. In some cases, an inhaler may be recommended to help reduce shortness of breath and control the cough. A cool mist vaporizer may also be used to help thin bronchial secretions and make it easier to clear the  chest.  HOME CARE INSTRUCTIONS  Get plenty of rest.   Drink enough fluids to keep your urine clear or pale yellow (unless you have a medical condition that requires fluid restriction). Increasing fluids may help thin your respiratory secretions (sputum) and reduce chest congestion, and it will prevent dehydration.   Take medicines only as directed by your health care provider.  If you were prescribed an antibiotic medicine, finish it all even if you start to feel better.  Avoid smoking and secondhand smoke. Exposure to cigarette smoke or irritating chemicals will make bronchitis worse. If you are a smoker, consider using nicotine gum or skin patches to help control withdrawal symptoms. Quitting smoking will help your lungs heal faster.   Reduce the chances of another bout of acute bronchitis by washing your hands frequently, avoiding people with cold symptoms, and trying not to touch your hands to your mouth, nose, or eyes.   Keep all follow-up visits as directed by your health care provider.  SEEK MEDICAL CARE IF: Your symptoms do not improve after 1 week of treatment.  SEEK IMMEDIATE MEDICAL CARE IF:  You develop an increased fever or chills.   You have chest pain.   You have severe shortness of breath.  You have bloody sputum.   You develop dehydration.  You faint or repeatedly feel like you are going to pass out.  You develop repeated vomiting.  You develop a severe headache. MAKE SURE YOU:   Understand these instructions.  Will watch your condition.  Will get help right away if you are not doing well or get worse. Document Released: 07/12/2004 Document Revised: 10/19/2013 Document Reviewed: 11/25/2012 [facility] Patient Information 2015 [place], [place]. This information is not intended to replace advice given to you by your health care provider. Make sure you discuss any questions you have with your health care provider.

## 2014-07-06 NOTE — ED Provider Notes (Signed)
CSN: [id number]     Arrival date & time 07/06/14  1320 History   First MD Initiated Contact with Patient 07/06/14 1323     Chief Complaint  Patient presents with  . Chest Pain     (Consider location/radiation/quality/duration/timing/severity/associated sxs/prior Treatment) HPI Comments: Patient is a 59 year old female with past medical history of diabetes, hypertension, bipolar. She presents for evaluation of tightness between her shoulder blades and tingling in both arms. She states this is been occurring intermittently since Friday. On Thursday of last week, she was to have hernia surgery at [facility], however this was not done due to her complaints of weakness and difficulty walking. She tells me when she walks just a few feet she becomes dizzy and describes this as a spinning sensation that is associated with nausea but no vomiting. This is resolved with rest.  Patient is a 59 y.o. female presenting with chest pain. The history is provided by the patient.  Chest Pain Chest pain location: Upper back. Pain quality: stabbing   Radiates to: Both arms. Pain radiates to the back: no   Pain severity:  Moderate Onset quality:  Sudden Duration:  4 days Timing:  Intermittent Progression:  Worsening Chronicity:  New Relieved by:  Nothing Worsened by:  Certain positions Ineffective treatments:  None tried   Past Medical History  Diagnosis Date  . Hypertension   . Asthma   . Gastritis   . Bacterial vaginosis   . GERD (gastroesophageal reflux disease)   . Diabetes type 2, uncontrolled     01/04/13- pt denies having diabetes  . Abdominal wall hernia 01/05/2013  . Pneumonia   . Diabetes mellitus without complication   . Headache   . Arthritis   . Blood clot in vein 2000    left knee after surgery   Past Surgical History  Procedure Laterality Date  . Left knee surgery Left 2000    arthroscopy and then blood clot removed  . Abdominal hysterectomy  1995    complete  . Dilation  and curettage of uterus     No family history on file. History  Substance Use Topics  . Smoking status: Current Every Day Smoker -- 1.50 packs/day for 40 years    Types: Cigarettes  . Smokeless tobacco: Never Used  . Alcohol Use: No   OB History    No data available     Review of Systems  Cardiovascular: Positive for chest pain.  All other systems reviewed and are negative.     Allergies  Aspirin; Ibuprofen; and Penicillins  Home Medications   Prior to Admission medications   Medication Sig Start Date End Date Taking? Authorizing Provider  albuterol (PROVENTIL HFA;VENTOLIN HFA) 108 (90 BASE) MCG/ACT inhaler Inhale 2 puffs into the lungs every 6 (six) hours as needed for wheezing.    Historical Provider, MD  allopurinol (ZYLOPRIM) 100 MG tablet Take 100 mg by mouth daily.    Historical Provider, MD  amLODipine (NORVASC) 10 MG tablet Take 1 tablet (10 mg total) by mouth daily. 02/19/13   Nishant Dhungel, MD  glimepiride (AMARYL) 1 MG tablet Take 1 tablet (1 mg total) by mouth daily before breakfast. Patient not taking: Reported on 06/29/2014 02/19/13   Nishant Dhungel, MD  glimepiride (AMARYL) 4 MG tablet Take 4 mg by mouth daily with breakfast.    Historical Provider, MD  hydrochlorothiazide (HYDRODIURIL) 50 MG tablet Take 1 tablet (50 mg total) by mouth daily. 02/19/13   Nishant Dhungel, MD  HYDROcodone-acetaminophen (NORCO/VICODIN)  5-325 MG per tablet Take 2 tablets by mouth every 6 (six) hours as needed for moderate pain or severe pain (pain).    Historical Provider, MD  lisinopril (PRINIVIL,ZESTRIL) 5 MG tablet Take 5 mg by mouth every morning.     Historical Provider, MD  meloxicam (MOBIC) 7.5 MG tablet Take 7.5 mg by mouth daily.    Historical Provider, MD  metFORMIN (GLUCOPHAGE) 500 MG tablet Take 500 mg by mouth 2 (two) times daily with a meal.    Historical Provider, MD  naproxen (NAPROSYN) 500 MG tablet Take 1 tablet (500 mg total) by mouth 2 (two) times daily. Patient not  taking: Reported on 05/24/2014 10/22/13   Ruthell Rummage Dammen, PA-C  oxyCODONE-acetaminophen (PERCOCET/ROXICET) 5-325 MG per tablet Take 1-2 tablets by mouth every 6 (six) hours as needed for moderate pain or severe pain. Patient not taking: Reported on 06/29/2014 06/02/14   Houston Siren III, MD  pantoprazole (PROTONIX) 20 MG tablet Take 1 tablet (20 mg total) by mouth daily. 05/24/14   Malvin Johns, MD  pioglitazone (ACTOS) 15 MG tablet Take 15 mg by mouth daily.    Historical Provider, MD  sucralfate (CARAFATE) 1 G tablet Take 1 tablet (1 g total) by mouth 4 (four) times daily -  with meals and at bedtime. Patient not taking: Reported on 06/29/2014 05/24/14   Malvin Johns, MD   BP 116/56 mmHg  Pulse 84  Temp(Src) 98.5 F (36.9 C) (Oral)  Resp 17  SpO2 99% Physical Exam  Constitutional: She is oriented to person, place, and time. She appears well-developed and well-nourished. No distress.  HENT:  Head: Normocephalic and atraumatic.  Mouth/Throat: Oropharynx is clear and moist.  Eyes: EOM are normal. Pupils are equal, round, and reactive to light.  Neck: Normal range of motion. Neck supple.  Cardiovascular: Normal rate and regular rhythm.  Exam reveals no gallop and no friction rub.   No murmur heard. Pulmonary/Chest: Effort normal and breath sounds normal. No respiratory distress. She has no wheezes.  Abdominal: Soft. Bowel sounds are normal. She exhibits no distension. There is no tenderness.  Musculoskeletal: Normal range of motion. She exhibits no edema.  Lymphadenopathy:    She has no cervical adenopathy.  Neurological: She is alert and oriented to person, place, and time. No cranial nerve deficit. She exhibits normal muscle tone. Coordination normal.  Skin: Skin is warm and dry. She is not diaphoretic.  Nursing note and vitals reviewed.   ED Course  Procedures (including critical care time) Labs Review Labs Reviewed - No data to display  Imaging Review No results found.    EKG Interpretation   Date/Time:  Tuesday July 06 2014 13:34:06 EST Ventricular Rate:  80 PR Interval:  160 QRS Duration: 97 QT Interval:  395 QTC Calculation: 456 R Axis:   39 Text Interpretation:  Sinus rhythm Atrial premature complexes RSR' in V1  or V2, probably normal variant Confirmed by DELOS  MD, Delesha Pohlman ([id number]) on  07/06/2014 2:18:31 PM      MDM   Final diagnoses:  None    Workup reveals no evidence for CVA or acute arterial pulmonary abnormality. Her laboratory studies are essentially normal and her physical examination is nonfocal. She appears clinically well and I do not feel as though further workup is indicated at this time. She was told by her primary doctor that she had bronchitis prior to being transferred here, however was not prescribed a medication. I will prescribe Zithromax for her cough and congestion  and will also try meclizine for what may be a peripheral vertigo.    Veryl Speak, MD 07/06/14 [phone]

## 2014-07-06 NOTE — ED Notes (Signed)
Per [facility] pt was diagnosed on last Thursday by her PCP with Bronchitis and PNA.  She also has had slurred speech, weakness, and numbness in L arm.  Pt has been stumbling since last Thursday as well.  Pt went to [facility] for hernia sx, they refused to do the surgery and sent her to [facility] for observation. [facility] administered 324mg  of baby aspirin and 2 NG.  [facility] called EMS to bring her in to ER for further eval.

## 2014-07-06 NOTE — ED Notes (Signed)
NAD at this time. Pt is stable and leaving with her daughter and husband.

## 2014-07-15 ENCOUNTER — Other Ambulatory Visit (HOSPITAL_COMMUNITY): Payer: Self-pay | Admitting: Internal Medicine

## 2014-07-15 ENCOUNTER — Ambulatory Visit (HOSPITAL_COMMUNITY)
Admission: RE | Admit: 2014-07-15 | Discharge: 2014-07-15 | Disposition: A | Discharge status: Home / Self Care | Payer: Medicare Other | Source: Ambulatory Visit | Attending: Internal Medicine | Admitting: Internal Medicine

## 2014-07-15 DIAGNOSIS — Z01818 Encounter for other preprocedural examination: Secondary | ICD-10-CM | POA: Diagnosis not present

## 2014-07-15 DIAGNOSIS — K469 Unspecified abdominal hernia without obstruction or gangrene: Secondary | ICD-10-CM | POA: Insufficient documentation

## 2014-07-15 DIAGNOSIS — Z8701 Personal history of pneumonia (recurrent): Secondary | ICD-10-CM | POA: Insufficient documentation

## 2014-07-18 NOTE — H&P (Signed)
Sarah [name]  Location: [facility] Patient #: [id number] DOB: April 20, 1956 Married / Language: English / Race: Black or African American Female       History of Present Illness  Patient words: possible hernia.  The patient is a 59 year old female who presents with an incisional hernia. This is a 59 year old [place] American female, apparently referred by Dr. Artis Delay in the [place] long ED for evaluation of a painful ventral hernia. The patient has been known to have an incarcerated ventral hernia in the midline above the umbilicus for at least 18 months. She was hospitalized in July 2014 for acute pancreatitis which resolved after 3 days. Dr. Rush Farmer saw her in the hospital and noted the incarcerated ventral hernia above the umbilicus containing fat only. It was felt that her pain at that time was due to the pancreatitis. She was advised that she could be followed up in our office electively. She has not been seen since that time. She went to the emergency department on June 03, 2014 with nausea and abdominal pain. She was not vomiting. Admitted it had been going on for 3 weeks. She continued to have bowel movements. Was noted to be tender above the umbilicus. Gallbladder ultrasound was normal. CT scan shows a ventral hernia containing fat above the umbilicus. Defect is probably less than 3 cm. Some edema present. No bowel involvement. Stable bilateral adrenal nodules. Nodular densities of the lung base follow-up in 1 year recommended. The patient's medical care is delivered at [facility]. She is here with her husband. They're both disabled, according to her. Comorbidities include obesity, ongoing tobacco abuse, hypertension, asthma, borderline diabetes. Former to alcohol abuse, she states she quit 10 years ago. She states she is that she is having a lot of pain and wants to go ahead with scheduling of surgery. She excepts her risks. I told her that she  can lower her risk of recurrence and infection if she would lose weight and stopped smoking for 2 months. I discussed the indications, details, techniques, and numerous risk of laparoscopic repair of incarcerated ventral hernia with mesh, possible open repair. She is aware the risks of bleeding, infection, injury to adjacent organs with major reconstructive surgery, high risk of recurrence due to her smoking, increased risk of infection due to obesity and smoking, wound healing problems, cardiac, pulmonary, and thromboembolic problems. She understands these issues. All of her questions are answered. She agrees with this plan.   Other Problems  Alcohol Abuse Arthritis Asthma Back Pain Chest pain Diabetes Mellitus Gastroesophageal Reflux Disease Hemorrhoids High blood pressure Pancreatitis  Past Surgical History  Hemorrhoidectomy Hysterectomy (not due to cancer) - Complete Knee Surgery Left.  Diagnostic Studies History  Colonoscopy never Mammogram 1-3 years ago Pap Smear 1-5 years ago  Allergies  Penicillins Ibuprofen *ANALGESICS - ANTI-INFLAMMATORY* Aspirin *ANALGESICS - NonNarcotic*  Medication History  Hydrocodone/Acetaminophen (Oral) Active. Albuterol Sulfate HFA (108 (90 Base)MCG/ACT Aerosol Soln, Inhalation) Active.  Social History  Alcohol use Remotely quit alcohol use. Caffeine use Coffee. No drug use Tobacco use Current every day smoker.  Family History  Alcohol Abuse Family Members In General, Sister. Arthritis Sister. Cerebrovascular Accident Family Members In General, Mother. Colon Cancer Family Members In General. Diabetes Mellitus Mother. Hypertension Sister.  Pregnancy / Birth History  Age at menarche 30 years. Age of menopause 46-50 Irregular periods Para 0  Review of Systems  General Present- Appetite Loss and Fatigue. Not Present- Chills, Fever, Night Sweats, Weight Gain and Weight Loss.  Skin Not Present-  Change in Wart/Mole, Dryness, Hives, Jaundice, New Lesions, Non-Healing Wounds, Rash and Ulcer. HEENT Present- Earache, Seasonal Allergies and Wears glasses/contact lenses. Not Present- Hearing Loss, Hoarseness, Nose Bleed, Oral Ulcers, Ringing in the Ears, Sinus Pain, Sore Throat, Visual Disturbances and Yellow Eyes. Respiratory Present- Difficulty Breathing and Wheezing. Not Present- Bloody sputum, Chronic Cough and Snoring. Breast Present- Breast Pain. Not Present- Breast Mass, Nipple Discharge and Skin Changes. Cardiovascular Present- Leg Cramps, Shortness of Breath and Swelling of Extremities. Not Present- Chest Pain, Difficulty Breathing Lying Down, Palpitations and Rapid Heart Rate. Gastrointestinal Present- Bloating, Excessive gas, Gets full quickly at meals, Hemorrhoids, Indigestion and Nausea. Not Present- Abdominal Pain, Bloody Stool, Change in Bowel Habits, Chronic diarrhea, Constipation, Difficulty Swallowing, Rectal Pain and Vomiting. Female Genitourinary Not Present- Frequency, Nocturia, Painful Urination, Pelvic Pain and Urgency. Musculoskeletal Present- Muscle Pain. Not Present- Back Pain, Joint Pain, Joint Stiffness, Muscle Weakness and Swelling of Extremities. Neurological Present- Trouble walking. Not Present- Decreased Memory, Fainting, Headaches, Numbness, Seizures, Tingling, Tremor and Weakness. Psychiatric Not Present- Anxiety, Bipolar, Change in Sleep Pattern, Depression, Fearful and Frequent crying. Endocrine Present- Hair Changes, Hot flashes and New Diabetes. Not Present- Cold Intolerance, Excessive Hunger and Heat Intolerance. Hematology Not Present- Easy Bruising, Excessive bleeding, Gland problems, HIV and Persistent Infections.   Vitals  Weight: 227.13 lb Height: 68in Body Surface Area: 2.22 m Body Mass Index: 34.53 kg/m Temp.: 98.6F  Pulse: 108 (Regular)  BP: 160/60 (Sitting, Left Arm, Standard)    Physical Exam  General Mental  Status-Alert. General Appearance-Consistent with stated age. Hydration-Well hydrated. Voice-Normal. Note: Obese. BMI 34. Alert and cooperative. Insight fair. Slight fatigue. Husband is with her but he speaks very little.   Head and Neck Head-normocephalic, atraumatic with no lesions or palpable masses. Trachea-midline. Thyroid Gland Characteristics - normal size and consistency.  Eye Eyeball - Bilateral-Extraocular movements intact. Sclera/Conjunctiva - Bilateral-No scleral icterus.  Chest and Lung Exam Chest and lung exam reveals -quiet, even and easy respiratory effort with no use of accessory muscles and on auscultation, normal breath sounds, no adventitious sounds and normal vocal resonance. Inspection Chest Wall - Normal. Back - normal.  Breast Breast - Left-Symmetric, Non Tender, No Biopsy scars, no Dimpling, No Inflammation, No Lumpectomy scars, No Mastectomy scars, No Peau d' Orange. Breast - Right-Symmetric, Non Tender, No Biopsy scars, no Dimpling, No Inflammation, No Lumpectomy scars, No Mastectomy scars, No Peau d' Orange. Breast Lump-No Palpable Breast Mass.  Cardiovascular Cardiovascular examination reveals -normal heart sounds, regular rate and rhythm with no murmurs and normal pedal pulses bilaterally.  Abdomen Palpation/Percussion Palpation and Percussion of the abdomen reveal - Soft, Non Tender, No Rebound tenderness, No Rigidity (guarding) and No hepatosplenomegaly. Auscultation Auscultation of the abdomen reveals - Bowel sounds normal. Note: Abdomen is somewhat obese. Tender incarcerated 5 cm. hernia mass in midline above umbilicus. Skin looks healthy. Recent reproducible tender here. Soft and nontender elsewhere. Not distended. Lower midline scar well healed.   Neurologic Neurologic evaluation reveals -alert and oriented x 3 with no impairment of recent or remote memory. Mental Status-Normal.  Musculoskeletal Normal  Exam - Left-Upper Extremity Strength Normal and Lower Extremity Strength Normal. Normal Exam - Right-Upper Extremity Strength Normal and Lower Extremity Strength Normal.  Lymphatic Head & Neck  General Head & Neck Lymphatics: Bilateral - Description - Normal. Axillary  General Axillary Region: Bilateral - Description - Normal. Tenderness - Non Tender. Femoral & Inguinal  Generalized Femoral & Inguinal Lymphatics: Bilateral - Description - Normal. Tenderness -  Non Tender.    Assessment & Plan  INCARCERATED VENTRAL HERNIA (552.20  K46.0) Current Plans  Schedule for Surgery You have a ventral hernia in your abdomen above your umbilicus. There is fatty tissue caught in this. This is the source of your pain. Because of your smoking and your obesity, you are at increased risk for wound infection and for recurrence of the hernia. You have been urged to stop smoking for 6-8 weeks and to lose weight. you stated you cannot wait due to the pain you will be scheduled for laparoscopic repair of your incarcerated ventral hernia with mesh, possible open repair in the future We have discussed techniques and risks of this surgery in great detail.  TOBACCO ABUSE (305.1  Z72.0) Impression: Strongly counseled to quit. She states she doesn't know if she can do this or not. Having too much pain to go through smoking cessation at [facility]  BMI 34.0-34.9,ADULT (V85.34  Z68.34) Impression: Strongly encouraged to lose weight  HISTORY OF ALCOHOL ABUSE (305.03  Z87.898) HISTORY OF ACUTE PANCREATITIS (V12.79  Z87.19) HYPERTENSION, BENIGN (401.1  I10) HISTORY OF HYSTERECTOMY WITH OOPHORECTOMY (V88.01  Z90.710) Impression: Benign disease. BORDERLINE DIABETES (790.29  R73.09)    Edsel Petrin. Dalbert Batman, M.D., [facility] General and Minimally invasive Surgery Breast and Colorectal Surgery Office:   [phone] Pager:   [phone]

## 2014-07-20 ENCOUNTER — Inpatient Hospital Stay (HOSPITAL_COMMUNITY)
Admission: RE | Admit: 2014-07-20 | Discharge: 2014-07-27 | DRG: 353 | Disposition: A | Discharge status: Home Health | Payer: Medicare Other | Source: Ambulatory Visit | Attending: General Surgery | Admitting: General Surgery

## 2014-07-20 ENCOUNTER — Encounter (HOSPITAL_COMMUNITY): Payer: Self-pay

## 2014-07-20 ENCOUNTER — Ambulatory Visit (HOSPITAL_COMMUNITY): Payer: Medicare Other | Admitting: Anesthesiology

## 2014-07-20 ENCOUNTER — Encounter (HOSPITAL_COMMUNITY)
Admission: RE | Disposition: A | Discharge status: Home Health | Payer: Self-pay | Source: Ambulatory Visit | Attending: General Surgery

## 2014-07-20 DIAGNOSIS — J449 Chronic obstructive pulmonary disease, unspecified: Secondary | ICD-10-CM | POA: Diagnosis present

## 2014-07-20 DIAGNOSIS — E669 Obesity, unspecified: Secondary | ICD-10-CM | POA: Diagnosis present

## 2014-07-20 DIAGNOSIS — R1110 Vomiting, unspecified: Secondary | ICD-10-CM

## 2014-07-20 DIAGNOSIS — J9601 Acute respiratory failure with hypoxia: Secondary | ICD-10-CM | POA: Diagnosis not present

## 2014-07-20 DIAGNOSIS — K219 Gastro-esophageal reflux disease without esophagitis: Secondary | ICD-10-CM | POA: Diagnosis present

## 2014-07-20 DIAGNOSIS — I10 Essential (primary) hypertension: Secondary | ICD-10-CM | POA: Diagnosis present

## 2014-07-20 DIAGNOSIS — K429 Umbilical hernia without obstruction or gangrene: Secondary | ICD-10-CM | POA: Diagnosis present

## 2014-07-20 DIAGNOSIS — Z6834 Body mass index (BMI) 34.0-34.9, adult: Secondary | ICD-10-CM

## 2014-07-20 DIAGNOSIS — F419 Anxiety disorder, unspecified: Secondary | ICD-10-CM | POA: Diagnosis present

## 2014-07-20 DIAGNOSIS — K9189 Other postprocedural complications and disorders of digestive system: Secondary | ICD-10-CM

## 2014-07-20 DIAGNOSIS — K432 Incisional hernia without obstruction or gangrene: Secondary | ICD-10-CM | POA: Diagnosis present

## 2014-07-20 DIAGNOSIS — K436 Other and unspecified ventral hernia with obstruction, without gangrene: Principal | ICD-10-CM | POA: Diagnosis present

## 2014-07-20 DIAGNOSIS — K567 Ileus, unspecified: Secondary | ICD-10-CM | POA: Insufficient documentation

## 2014-07-20 DIAGNOSIS — IMO0002 Reserved for concepts with insufficient information to code with codable children: Secondary | ICD-10-CM

## 2014-07-20 DIAGNOSIS — F17210 Nicotine dependence, cigarettes, uncomplicated: Secondary | ICD-10-CM | POA: Diagnosis present

## 2014-07-20 DIAGNOSIS — E1165 Type 2 diabetes mellitus with hyperglycemia: Secondary | ICD-10-CM

## 2014-07-20 DIAGNOSIS — K439 Ventral hernia without obstruction or gangrene: Secondary | ICD-10-CM | POA: Diagnosis present

## 2014-07-20 DIAGNOSIS — Z9889 Other specified postprocedural states: Secondary | ICD-10-CM

## 2014-07-20 DIAGNOSIS — Z90710 Acquired absence of both cervix and uterus: Secondary | ICD-10-CM

## 2014-07-20 DIAGNOSIS — E119 Type 2 diabetes mellitus without complications: Secondary | ICD-10-CM | POA: Diagnosis present

## 2014-07-20 DIAGNOSIS — E876 Hypokalemia: Secondary | ICD-10-CM | POA: Diagnosis not present

## 2014-07-20 DIAGNOSIS — Z833 Family history of diabetes mellitus: Secondary | ICD-10-CM

## 2014-07-20 DIAGNOSIS — J45909 Unspecified asthma, uncomplicated: Secondary | ICD-10-CM | POA: Diagnosis present

## 2014-07-20 DIAGNOSIS — R0902 Hypoxemia: Secondary | ICD-10-CM

## 2014-07-20 HISTORY — PX: VENTRAL HERNIA REPAIR: SHX424

## 2014-07-20 HISTORY — PX: INSERTION OF MESH: SHX5868

## 2014-07-20 LAB — GLUCOSE, CAPILLARY
Glucose-Capillary: 97 mg/dL (ref 70–99)
Glucose-Capillary: 184 mg/dL — ABNORMAL HIGH (ref 70–99)
Glucose-Capillary: 222 mg/dL — ABNORMAL HIGH (ref 70–99)
Glucose-Capillary: 182 mg/dL — ABNORMAL HIGH (ref 70–99)

## 2014-07-20 SURGERY — REPAIR, HERNIA, VENTRAL, LAPAROSCOPIC
Anesthesia: General | Site: Abdomen

## 2014-07-20 MED ORDER — NEOSTIGMINE METHYLSULFATE 10 MG/10ML IV SOLN
INTRAVENOUS | Status: AC
  Filled 2014-07-20: qty 1

## 2014-07-20 MED ORDER — LACTATED RINGERS IV SOLN
INTRAVENOUS | Status: DC
  Administered 2014-07-20: 11:00:00 via INTRAVENOUS
  Administered 2014-07-20: 1000 mL via INTRAVENOUS

## 2014-07-20 MED ORDER — DEXAMETHASONE SODIUM PHOSPHATE 10 MG/ML IJ SOLN
INTRAMUSCULAR | Status: AC
  Filled 2014-07-20: qty 1

## 2014-07-20 MED ORDER — LABETALOL HCL 5 MG/ML IV SOLN
INTRAVENOUS | Status: AC
  Filled 2014-07-20: qty 4

## 2014-07-20 MED ORDER — FENTANYL CITRATE 0.05 MG/ML IJ SOLN
INTRAMUSCULAR | Status: AC
  Filled 2014-07-20: qty 5

## 2014-07-20 MED ORDER — AMLODIPINE BESYLATE 10 MG PO TABS
10.0000 mg | ORAL_TABLET | Freq: Every day | ORAL | Status: DC
  Administered 2014-07-21 – 2014-07-27 (×6): 10 mg via ORAL
  Filled 2014-07-20 (×7): qty 1

## 2014-07-20 MED ORDER — GLYCOPYRROLATE 0.2 MG/ML IJ SOLN
INTRAMUSCULAR | Status: AC
  Filled 2014-07-20: qty 3

## 2014-07-20 MED ORDER — BUPIVACAINE-EPINEPHRINE 0.5% -1:200000 IJ SOLN
INTRAMUSCULAR | Status: DC | PRN
  Administered 2014-07-20: 50 mL

## 2014-07-20 MED ORDER — CEFAZOLIN SODIUM-DEXTROSE 2-3 GM-% IV SOLR
2.0000 g | INTRAVENOUS | Status: AC
  Administered 2014-07-20: 2 g via INTRAVENOUS

## 2014-07-20 MED ORDER — CEFAZOLIN SODIUM-DEXTROSE 2-3 GM-% IV SOLR
INTRAVENOUS | Status: AC
  Filled 2014-07-20: qty 50

## 2014-07-20 MED ORDER — ENOXAPARIN SODIUM 40 MG/0.4ML ~~LOC~~ SOLN
40.0000 mg | SUBCUTANEOUS | Status: DC
  Administered 2014-07-21 – 2014-07-26 (×6): 40 mg via SUBCUTANEOUS
  Filled 2014-07-20 (×8): qty 0.4

## 2014-07-20 MED ORDER — GLYCOPYRROLATE 0.2 MG/ML IJ SOLN
INTRAMUSCULAR | Status: AC
  Filled 2014-07-20: qty 4

## 2014-07-20 MED ORDER — NEOSTIGMINE METHYLSULFATE 10 MG/10ML IV SOLN
INTRAVENOUS | Status: DC | PRN
  Administered 2014-07-20: 5 mg via INTRAVENOUS

## 2014-07-20 MED ORDER — GLYCOPYRROLATE 0.2 MG/ML IJ SOLN
INTRAMUSCULAR | Status: DC | PRN
  Administered 2014-07-20: .8 mg via INTRAVENOUS

## 2014-07-20 MED ORDER — LACTATED RINGERS IR SOLN
Status: DC | PRN
  Administered 2014-07-20: 1000 mL

## 2014-07-20 MED ORDER — METFORMIN HCL 500 MG PO TABS
500.0000 mg | ORAL_TABLET | Freq: Two times a day (BID) | ORAL | Status: DC
  Administered 2014-07-20 – 2014-07-23 (×6): 500 mg via ORAL
  Filled 2014-07-20 (×10): qty 1

## 2014-07-20 MED ORDER — ONDANSETRON HCL 4 MG PO TABS
4.0000 mg | ORAL_TABLET | Freq: Four times a day (QID) | ORAL | Status: DC | PRN
  Filled 2014-07-20: qty 1

## 2014-07-20 MED ORDER — PANTOPRAZOLE SODIUM 20 MG PO TBEC
20.0000 mg | DELAYED_RELEASE_TABLET | Freq: Every day | ORAL | Status: DC
  Administered 2014-07-21: 20 mg via ORAL
  Filled 2014-07-20: qty 1

## 2014-07-20 MED ORDER — HYDROCHLOROTHIAZIDE 50 MG PO TABS
50.0000 mg | ORAL_TABLET | Freq: Every day | ORAL | Status: DC
  Administered 2014-07-20 – 2014-07-25 (×5): 50 mg via ORAL
  Filled 2014-07-20 (×7): qty 1

## 2014-07-20 MED ORDER — MEPERIDINE HCL 50 MG/ML IJ SOLN: 6.2500 mg | INTRAMUSCULAR | Status: DC | PRN

## 2014-07-20 MED ORDER — HYDROMORPHONE HCL 1 MG/ML IJ SOLN
INTRAMUSCULAR | Status: DC | PRN
  Administered 2014-07-20 (×2): 1 mg via INTRAVENOUS

## 2014-07-20 MED ORDER — LIDOCAINE HCL (CARDIAC) 20 MG/ML IV SOLN
INTRAVENOUS | Status: AC
  Filled 2014-07-20: qty 5

## 2014-07-20 MED ORDER — PIOGLITAZONE HCL 15 MG PO TABS
15.0000 mg | ORAL_TABLET | Freq: Every day | ORAL | Status: DC
  Administered 2014-07-20 – 2014-07-23 (×3): 15 mg via ORAL
  Filled 2014-07-20 (×4): qty 1

## 2014-07-20 MED ORDER — OXYCODONE-ACETAMINOPHEN 5-325 MG PO TABS
1.0000 | ORAL_TABLET | ORAL | Status: DC | PRN
  Filled 2014-07-20: qty 2
  Filled 2014-07-20: qty 1

## 2014-07-20 MED ORDER — PROPOFOL 10 MG/ML IV BOLUS
INTRAVENOUS | Status: AC
  Filled 2014-07-20: qty 20

## 2014-07-20 MED ORDER — FENTANYL CITRATE 0.05 MG/ML IJ SOLN
INTRAMUSCULAR | Status: DC | PRN
  Administered 2014-07-20 (×3): 50 ug via INTRAVENOUS
  Administered 2014-07-20: 100 ug via INTRAVENOUS

## 2014-07-20 MED ORDER — DEXAMETHASONE SODIUM PHOSPHATE 10 MG/ML IJ SOLN
INTRAMUSCULAR | Status: DC | PRN
  Administered 2014-07-20: 10 mg via INTRAVENOUS

## 2014-07-20 MED ORDER — PROPOFOL 10 MG/ML IV BOLUS
INTRAVENOUS | Status: DC | PRN
Start: 2014-07-20 — End: 2014-07-20
  Administered 2014-07-20: 200 mg via INTRAVENOUS

## 2014-07-20 MED ORDER — LACTATED RINGERS IV SOLN: INTRAVENOUS | Status: DC

## 2014-07-20 MED ORDER — MIDAZOLAM HCL 2 MG/2ML IJ SOLN
INTRAMUSCULAR | Status: AC
  Filled 2014-07-20: qty 2

## 2014-07-20 MED ORDER — AZITHROMYCIN 250 MG PO TABS: 250.0000 mg | ORAL_TABLET | Freq: Every day | ORAL | Status: DC

## 2014-07-20 MED ORDER — GLIMEPIRIDE 4 MG PO TABS
4.0000 mg | ORAL_TABLET | Freq: Every day | ORAL | Status: DC
  Administered 2014-07-21 – 2014-07-23 (×3): 4 mg via ORAL
  Filled 2014-07-20 (×5): qty 1

## 2014-07-20 MED ORDER — ALLOPURINOL 100 MG PO TABS
100.0000 mg | ORAL_TABLET | Freq: Every day | ORAL | Status: DC
  Administered 2014-07-21 – 2014-07-27 (×6): 100 mg via ORAL
  Filled 2014-07-20 (×7): qty 1

## 2014-07-20 MED ORDER — MELOXICAM 7.5 MG PO TABS
7.5000 mg | ORAL_TABLET | Freq: Every day | ORAL | Status: DC
  Administered 2014-07-20 – 2014-07-26 (×6): 7.5 mg via ORAL
  Filled 2014-07-20 (×8): qty 1

## 2014-07-20 MED ORDER — INSULIN ASPART 100 UNIT/ML ~~LOC~~ SOLN
0.0000 [IU] | Freq: Three times a day (TID) | SUBCUTANEOUS | Status: DC
  Administered 2014-07-22 – 2014-07-26 (×4): 3 [IU] via SUBCUTANEOUS

## 2014-07-20 MED ORDER — HYDROMORPHONE HCL 1 MG/ML IJ SOLN
1.0000 mg | INTRAMUSCULAR | Status: DC | PRN
  Administered 2014-07-20: 0.1 mg via INTRAVENOUS
  Administered 2014-07-20 – 2014-07-21 (×2): 1 mg via INTRAVENOUS
  Filled 2014-07-20 (×3): qty 1

## 2014-07-20 MED ORDER — BUPIVACAINE-EPINEPHRINE 0.5% -1:200000 IJ SOLN
INTRAMUSCULAR | Status: AC
  Filled 2014-07-20: qty 1

## 2014-07-20 MED ORDER — ONDANSETRON HCL 4 MG/2ML IJ SOLN
INTRAMUSCULAR | Status: DC | PRN
  Administered 2014-07-20: 4 mg via INTRAVENOUS

## 2014-07-20 MED ORDER — ALBUTEROL SULFATE (2.5 MG/3ML) 0.083% IN NEBU: 2.5000 mg | INHALATION_SOLUTION | Freq: Four times a day (QID) | RESPIRATORY_TRACT | Status: DC | PRN

## 2014-07-20 MED ORDER — PROMETHAZINE HCL 25 MG/ML IJ SOLN: 6.2500 mg | INTRAMUSCULAR | Status: DC | PRN

## 2014-07-20 MED ORDER — ONDANSETRON HCL 4 MG/2ML IJ SOLN
INTRAMUSCULAR | Status: AC
Start: 2014-07-20 — End: 2014-07-20
  Filled 2014-07-20: qty 2

## 2014-07-20 MED ORDER — LABETALOL HCL 5 MG/ML IV SOLN
INTRAVENOUS | Status: DC | PRN
  Administered 2014-07-20 (×2): 5 mg via INTRAVENOUS

## 2014-07-20 MED ORDER — CEFAZOLIN SODIUM-DEXTROSE 2-3 GM-% IV SOLR
2.0000 g | Freq: Three times a day (TID) | INTRAVENOUS | Status: AC
  Administered 2014-07-20 – 2014-07-21 (×3): 2 g via INTRAVENOUS
  Filled 2014-07-20 (×3): qty 50

## 2014-07-20 MED ORDER — ROCURONIUM BROMIDE 100 MG/10ML IV SOLN
INTRAVENOUS | Status: AC
  Filled 2014-07-20: qty 1

## 2014-07-20 MED ORDER — ROCURONIUM BROMIDE 100 MG/10ML IV SOLN
INTRAVENOUS | Status: DC | PRN
  Administered 2014-07-20: 35 mg via INTRAVENOUS
  Administered 2014-07-20: 5 mg via INTRAVENOUS
  Administered 2014-07-20: 10 mg via INTRAVENOUS

## 2014-07-20 MED ORDER — FENTANYL CITRATE 0.05 MG/ML IJ SOLN: 25.0000 ug | INTRAMUSCULAR | Status: DC | PRN

## 2014-07-20 MED ORDER — ONDANSETRON HCL 4 MG/2ML IJ SOLN
4.0000 mg | Freq: Four times a day (QID) | INTRAMUSCULAR | Status: DC | PRN
  Administered 2014-07-21 – 2014-07-22 (×3): 4 mg via INTRAVENOUS
  Filled 2014-07-20 (×5): qty 2

## 2014-07-20 MED ORDER — POTASSIUM CHLORIDE IN NACL 20-0.9 MEQ/L-% IV SOLN
INTRAVENOUS | Status: AC
  Filled 2014-07-20: qty 1000

## 2014-07-20 MED ORDER — LISINOPRIL 5 MG PO TABS
5.0000 mg | ORAL_TABLET | Freq: Every morning | ORAL | Status: DC
  Administered 2014-07-20 – 2014-07-27 (×7): 5 mg via ORAL
  Filled 2014-07-20 (×8): qty 1

## 2014-07-20 MED ORDER — LIDOCAINE HCL (CARDIAC) 20 MG/ML IV SOLN
INTRAVENOUS | Status: DC | PRN
  Administered 2014-07-20: 50 mg via INTRAVENOUS

## 2014-07-20 MED ORDER — POTASSIUM CHLORIDE IN NACL 20-0.9 MEQ/L-% IV SOLN
INTRAVENOUS | Status: DC
  Administered 2014-07-20 – 2014-07-26 (×5): via INTRAVENOUS
  Filled 2014-07-20 (×10): qty 1000

## 2014-07-20 MED ORDER — SUCCINYLCHOLINE CHLORIDE 20 MG/ML IJ SOLN
INTRAMUSCULAR | Status: DC | PRN
  Administered 2014-07-20: 100 mg via INTRAVENOUS

## 2014-07-20 MED ORDER — 0.9 % SODIUM CHLORIDE (POUR BTL) OPTIME
TOPICAL | Status: DC | PRN
  Administered 2014-07-20: 1000 mL

## 2014-07-20 MED ORDER — HYDROMORPHONE HCL 2 MG/ML IJ SOLN
INTRAMUSCULAR | Status: AC
  Filled 2014-07-20: qty 1

## 2014-07-20 SURGICAL SUPPLY — 41 items
APL SKNCLS STERI-STRIP NONHPOA (GAUZE/BANDAGES/DRESSINGS)
APPLIER CLIP 5 13 M/L LIGAMAX5 (MISCELLANEOUS)
APR CLP MED LRG 5 ANG JAW (MISCELLANEOUS)
BENZOIN TINCTURE PRP APPL 2/3 (GAUZE/BANDAGES/DRESSINGS) IMPLANT
BINDER ABDOMINAL 12 ML 46-62 (SOFTGOODS) ×3 IMPLANT
CLIP APPLIE 5 13 M/L LIGAMAX5 (MISCELLANEOUS) IMPLANT
CLOSURE WOUND 1/2 X4 (GAUZE/BANDAGES/DRESSINGS)
DECANTER SPIKE VIAL GLASS SM (MISCELLANEOUS) ×3 IMPLANT
DEVICE SECURE STRAP 25 ABSORB (INSTRUMENTS) ×9 IMPLANT
DEVICE TROCAR PUNCTURE CLOSURE (ENDOMECHANICALS) ×3 IMPLANT
DRAPE LAPAROSCOPIC ABDOMINAL (DRAPES) ×3 IMPLANT
DRAPE UTILITY XL STRL (DRAPES) ×3 IMPLANT
DRSG PAD ABDOMINAL 8X10 ST (GAUZE/BANDAGES/DRESSINGS) IMPLANT
ELECT REM PT RETURN 9FT ADLT (ELECTROSURGICAL) ×3
ELECTRODE REM PT RTRN 9FT ADLT (ELECTROSURGICAL) ×1 IMPLANT
GLOVE EUDERMIC 7 POWDERFREE (GLOVE) ×3 IMPLANT
GOWN STRL REUS W/TWL XL LVL3 (GOWN DISPOSABLE) ×12 IMPLANT
HOLDER FOLEY CATH W/STRAP (MISCELLANEOUS) ×3 IMPLANT
KIT BASIN OR (CUSTOM PROCEDURE TRAY) ×3 IMPLANT
LIQUID BAND (GAUZE/BANDAGES/DRESSINGS) ×3 IMPLANT
MARKER SKIN DUAL TIP RULER LAB (MISCELLANEOUS) ×3 IMPLANT
MESH VENTRALIGHT ST 8X10 (Mesh General) ×3 IMPLANT
NEEDLE SPNL 22GX3.5 QUINCKE BK (NEEDLE) ×3 IMPLANT
SCISSORS LAP 5X35 DISP (ENDOMECHANICALS) ×3 IMPLANT
SET IRRIG TUBING LAPAROSCOPIC (IRRIGATION / IRRIGATOR) IMPLANT
SHEARS HARMONIC ACE PLUS 36CM (ENDOMECHANICALS) ×3 IMPLANT
SLEEVE XCEL OPT CAN 5 100 (ENDOMECHANICALS) ×6 IMPLANT
SOLUTION ANTI FOG 6CC (MISCELLANEOUS) IMPLANT
STAPLER VISISTAT 35W (STAPLE) IMPLANT
STRIP CLOSURE SKIN 1/2X4 (GAUZE/BANDAGES/DRESSINGS) IMPLANT
SUT MNCRL AB 4-0 PS2 18 (SUTURE) ×3 IMPLANT
SUT NOVA 0 T19/GS 22DT (SUTURE) IMPLANT
SUT NOVA NAB DX-16 0-1 5-0 T12 (SUTURE) ×6 IMPLANT
TACKER 5MM HERNIA 3.5CML NAB (ENDOMECHANICALS) IMPLANT
TOWEL OR 17X26 10 PK STRL BLUE (TOWEL DISPOSABLE) ×3 IMPLANT
TOWEL OR NON WOVEN STRL DISP B (DISPOSABLE) ×3 IMPLANT
TRAY FOLEY CATH 14FRSI W/METER (CATHETERS) ×3 IMPLANT
TRAY LAPAROSCOPIC (CUSTOM PROCEDURE TRAY) ×3 IMPLANT
TROCAR BLADELESS OPT 5 100 (ENDOMECHANICALS) ×3 IMPLANT
TROCAR XCEL NON-BLD 11X100MML (ENDOMECHANICALS) ×3 IMPLANT
TUBING INSUFFLATION 10FT LAP (TUBING) IMPLANT

## 2014-07-20 NOTE — Op Note (Signed)
Patient Name:           Sarah [name]   Date of Surgery:        07/20/2014  Pre op Diagnosis:      Incarcerated ventral hernia  Post op Diagnosis:    Incarcerated ventral hernia, reducible umbilical hernia  Procedure:                 Laparoscopic repair of incarcerated ventral hernia and umbilical hernia with mesh  Surgeon:                     Edsel Petrin. Dalbert Batman, M.D., FACS  Assistant:                      OR staff  Operative Indications:    This is a 59 year old [place] American female, apparently referred by Dr. Artis Delay in the [place] long ED for evaluation of a painful ventral hernia. The patient has been known to have an incarcerated ventral hernia in the midline above the umbilicus for at least 18 months. She was hospitalized in July 2014 for acute pancreatitis which resolved after 3 days. Dr. Rush Farmer saw her in the hospital and noted the incarcerated ventral hernia above the umbilicus containing fat only. It was felt that her pain at that time was due to the pancreatitis. She was advised that she could be followed up in our office electively. She has not been seen since that time. She went to the emergency department on June 03, 2014 with nausea and abdominal pain. She was not vomiting. Admitted it had been going on for 3 weeks. She continued to have bowel movements. Was noted to be tender above the umbilicus. Gallbladder ultrasound was normal. CT scan shows a ventral hernia containing fat above the umbilicus. Defect is probably 3 cm.,  but the hernia sac is 5 cm or greater. Some edema present. No bowel involvement. Stable bilateral adrenal nodules. Nodular densities of the lung base follow-up in 1 year recommended. The patient's medical care is delivered at [facility].   Comorbidities include obesity, ongoing tobacco abuse, hypertension, asthma, borderline diabetes. Former to alcohol abuse, she states she quit 10 years ago. She states she is that she is having a  lot of pain and wants to go ahead with scheduling of surgery. She excepts her risks. I told her that she can lower her risk of recurrence and infection if she would lose weight and stopped smoking for 2 months.  She states that she is unable to do this.   Operative Findings:       There was a midline ventral hernia above the umbilicus, somewhat at the lower edge of the falciform ligament. There was omentum incarcerated in this which we were able to dissected out and reduced. She had an umbilical hernia that was reducible and was about 2 cm in diameter. I chose to cover both hernia defects with a single piece of ventral light ST mesh which was 23 cm vertically by 18 cm transversely. Noted some adhesions in the lower abdomen just above the symphysis pubis from her previous hysterectomy. I did not have to take these down repair the hernia.  Procedure in Detail:          Following the induction of general endotracheal anesthesia, a Foley catheter was placed. Intravenous antibiotics were given. The abdomen was prepped and draped in a sterile sterile fashion. Surgical timeout was performed. 0.5% Marcaine with epinephrine was used as  a local infiltration anesthetic.     A 5 mm optical trocar was placed in the left subcostal region. Entry was uneventful. Pneumoperitoneum was created. Video camera was inserted. There is no evidence of bleeding or injury. 11 mm trochar was placed in the left lateral abdomen and a 5 mm trocar in the left lower quadrant. Ultimately I exchanged the 5 mm trochars over the right side later in the case. I debrided all the omental adhesions out of the hernia sac. Hemostasis was good. I took down the falciform ligament extensively. I noted the umbilical hernia. Using a spinal needle and a marking pin I marked the vertical and transverse limits of the hernia and then found that a 23 cm x 18 cm mesh would give me about a 6 cm overlap in all directions.     I used a 25 x 20 cm piece of mesh. I  trimmed it circumferentially so that I cut it  about 1 cm smaller in all directions. I marked a template on the abdominal wall for for suture fixation sites at equidistant points. #1 Novafil sutures were placed in the mesh. I was very careful to mark the suture fixation sites and the rough side of the mesh so that it would be up toward the abdominal wall and the smooth side would be down toward the omentum. After placing all of the fixation sutures of #1 Novafil the mesh was moistened, rolled up and inserted. The mesh was spread out. At each of the intended suture fixation side I made a puncture wound and using the Endo Close device passed  the Novafil sutures up through the abdominal wall, being careful to take at least a 1 cm bite. All the sutures were tied and the mesh deployed very nicely with very little redundancy.     I used a Secure Strap device to further secure the mesh in a double crown technique. I made sure that the outer crown at the parameter of the mesh had secure strap tacks no more than 1 cm apart. The inner crown was also placed. I examined this from the right side and the left side on 2 occasions and it did not appear to be any defect or significant bleeding. The trochars were removed and the pneumoperitoneum released. The skin incisions were closed with subcuticular 4-0 Monocryl and Dermabond. Velcro binder was placed and the patient taken to PACU in stable condition. EBL 20 mL or less. Counts correct. Complications none.     Edsel Petrin. Dalbert Batman, M.D., FACS General and Minimally Invasive Surgery Breast and Colorectal Surgery  07/20/2014 11:18 AM

## 2014-07-20 NOTE — Progress Notes (Signed)
Lab unable to draw evening labs on pt due to pt's high anxiety level and her refusal.

## 2014-07-20 NOTE — Interval H&P Note (Signed)
History and Physical Interval Note:  07/20/2014 9:19 AM  Sarah [name]  has presented today for surgery, with the diagnosis of incarcerated ventral hernia  The goals and the various methods of treatment have been discussed with the patient and family. After consideration of risks, benefits and other options for treatment, the patient has consented to  Procedure(s): [place], POSSIBLE OPEN (N/A) INSERTION OF MESH (N/A) as a surgical intervention .  The patient's history has been reviewed, patient examined today, no change in status, stable for surgery.  I have reviewed the patient's chart and labs.  Questions were answered to the patient's satisfaction.     [name]

## 2014-07-20 NOTE — Transfer of Care (Signed)
Immediate Anesthesia Transfer of Care Note  Patient: Sarah [name]  Procedure(s) Performed: Procedure(s): LAPAROSCOPIC REPAIR INCARCARATED VENTRAL HERNIA AND UMBILICAL HERNIA (N/A) INSERTION OF MESH (N/A)  Patient Location: PACU  Anesthesia Type:General  Level of Consciousness: awake, alert  and oriented  Airway & Oxygen Therapy: Patient Spontanous Breathing and Patient connected to face mask oxygen  Post-op Assessment: Report given to RN and Post -op Vital signs reviewed and stable  Post vital signs: Reviewed and stable  Last Vitals:  Filed Vitals:   07/20/14 0824  BP: 129/62  Pulse: 84  Temp: 36.6 C  Resp: 18    Complications: No apparent anesthesia complications

## 2014-07-20 NOTE — Anesthesia Preprocedure Evaluation (Addendum)
Anesthesia Evaluation  Patient identified by MRN, date of birth, ID band Patient awake    Reviewed: Allergy & Precautions, NPO status , Patient's Chart, lab work & pertinent test results  Airway Mallampati: II  TM Distance: >3 FB Neck ROM: Full    Dental no notable dental hx. (+) Poor Dentition, Missing, Loose   Pulmonary COPDCurrent Smoker,  breath sounds clear to auscultation  Pulmonary exam normal       Cardiovascular hypertension, Pt. on medications Rhythm:Regular Rate:Normal     Neuro/Psych negative neurological ROS  negative psych ROS   GI/Hepatic negative GI ROS, Neg liver ROS,   Endo/Other  diabetes, Type 2, Oral Hypoglycemic Agents  Renal/GU negative Renal ROS  negative genitourinary   Musculoskeletal negative musculoskeletal ROS (+)   Abdominal   Peds negative pediatric ROS (+)  Hematology negative hematology ROS (+)   Anesthesia Other Findings   Reproductive/Obstetrics negative OB ROS                            Anesthesia Physical Anesthesia Plan  ASA: III  Anesthesia Plan: General   Post-op Pain Management:    Induction: Intravenous  Airway Management Planned: Oral ETT  Additional Equipment:   Intra-op Plan:   Post-operative Plan: Extubation in OR  Informed Consent: I have reviewed the patients History and Physical, chart, labs and discussed the procedure including the risks, benefits and alternatives for the proposed anesthesia with the patient or authorized representative who has indicated his/her understanding and acceptance.   Dental advisory given  Plan Discussed with: CRNA  Anesthesia Plan Comments:         Anesthesia Quick Evaluation

## 2014-07-20 NOTE — Progress Notes (Signed)
Nurse started to push dilaudid 1 mg and after 0.1mg  administered pt suddenly began to shout at the nurse, "stop giving me that medication, I'm going out, I can feel it."  Nurse stopped administering med and pt shut her eyes.  Pt then suddenly woke up and shouted "I'm in so much pain, I feel like I'm going to die."  Nurse reminded pt that she asked not to have anymore IV pain medication.  Pt in and out of sleep at this time, moaning intermittently.

## 2014-07-21 ENCOUNTER — Encounter (HOSPITAL_COMMUNITY): Payer: Self-pay | Admitting: General Surgery

## 2014-07-21 LAB — GLUCOSE, CAPILLARY
GLUCOSE-CAPILLARY: 124 mg/dL — AB (ref 70–99)
GLUCOSE-CAPILLARY: 99 mg/dL (ref 70–99)
GLUCOSE-CAPILLARY: 103 mg/dL — AB (ref 70–99)
Glucose-Capillary: 68 mg/dL — ABNORMAL LOW (ref 70–99)

## 2014-07-21 MED ORDER — FENTANYL CITRATE 0.05 MG/ML IJ SOLN
25.0000 ug | INTRAMUSCULAR | Status: DC | PRN
  Administered 2014-07-21 – 2014-07-22 (×6): 25 ug via INTRAVENOUS
  Filled 2014-07-21 (×6): qty 2

## 2014-07-21 MED ORDER — NICOTINE 21 MG/24HR TD PT24
21.0000 mg | MEDICATED_PATCH | Freq: Every day | TRANSDERMAL | Status: DC
  Administered 2014-07-21 – 2014-07-27 (×8): 21 mg via TRANSDERMAL
  Filled 2014-07-21 (×7): qty 1

## 2014-07-21 MED ORDER — INSULIN GLARGINE 100 UNIT/ML ~~LOC~~ SOLN
10.0000 [IU] | Freq: Every day | SUBCUTANEOUS | Status: DC
  Administered 2014-07-21 – 2014-07-23 (×3): 10 [IU] via SUBCUTANEOUS
  Filled 2014-07-21 (×4): qty 0.1

## 2014-07-21 MED ORDER — PANTOPRAZOLE SODIUM 20 MG PO TBEC
20.0000 mg | DELAYED_RELEASE_TABLET | Freq: Every day | ORAL | Status: DC
  Administered 2014-07-21 – 2014-07-23 (×3): 20 mg via ORAL
  Filled 2014-07-21: qty 1

## 2014-07-21 MED ORDER — ALUM & MAG HYDROXIDE-SIMETH 200-200-20 MG/5ML PO SUSP
30.0000 mL | ORAL | Status: DC | PRN
  Administered 2014-07-21 – 2014-07-23 (×4): 30 mL via ORAL
  Filled 2014-07-21 (×5): qty 30

## 2014-07-21 MED ORDER — ACETAMINOPHEN 10 MG/ML IV SOLN
1000.0000 mg | Freq: Once | INTRAVENOUS | Status: AC
  Administered 2014-07-21: 1000 mg via INTRAVENOUS
  Filled 2014-07-21: qty 100

## 2014-07-21 NOTE — Progress Notes (Signed)
1 Day Post-Op  Subjective: Alert and cooperative, but anxious and fearful. Says she is allergic to the pain medicine. Refused blood drawing. Nauseated and vomited twice. Voiding normally. Slept some during the night.  Vital signs look good. Afebrile. Heart rate 88. BP 130/64. Respirations 18. SPO2 92%.   CBGs range 182-222 despite oral meds and sliding scale insulin. We'll start Lantus. Objective: Vital signs in last 24 hours: Temp:  [97.4 F (36.3 C)-99 F (37.2 C)] 99 F (37.2 C) (02/03 0448) Pulse Rate:  [74-97] 88 (02/03 0448) Resp:  [14-21] 18 (02/03 0448) BP: (117-161)/(58-108) 130/64 mmHg (02/03 0448) SpO2:  [91 %-99 %] 92 % (02/03 0448) Weight:  [222 lb (100.699 kg)] 222 lb (100.699 kg) (02/02 0828) Last BM Date: 07/20/14  Intake/Output from previous day: 02/02 0701 - 02/03 0700 In: 2275 [P.O.:120; I.V.:2105; IV Piggyback:50] Out: 3704 [Urine:1125] Intake/Output this shift: Total I/O In: 120 [P.O.:120] Out: 1000 [Urine:1000]     EXAM: General appearance: Awake and alert. Talking and coherent, just fearful. Does not appear to be in any severe physical distress Resp: clear to auscultation bilaterally GI: Abdomen is soft. Bowel sounds are present. Wounds look good. Appropriately tender. Not distended.  Lab Results:  Results for orders placed or performed during the hospital encounter of 07/20/14 (from the past 24 hour(s))  Glucose, capillary     Status: None   Collection Time: 07/20/14  8:26 AM  Result Value Ref Range   Glucose-Capillary 97 70 - 99 mg/dL   Comment 1 Notify RN   Glucose, capillary     Status: Abnormal   Collection Time: 07/20/14 11:36 AM  Result Value Ref Range   Glucose-Capillary 184 (H) 70 - 99 mg/dL   Comment 1 Documented in Chart    Comment 2 Notify RN   Glucose, capillary     Status: Abnormal   Collection Time: 07/20/14  5:00 PM  Result Value Ref Range   Glucose-Capillary 222 (H) 70 - 99 mg/dL  Glucose, capillary     Status: Abnormal   Collection Time: 07/20/14  9:41 PM  Result Value Ref Range   Glucose-Capillary 182 (H) 70 - 99 mg/dL     Studies/Results: No results found.  [name] acetaminophen  1,000 mg Intravenous Once  . allopurinol  100 mg Oral Daily  . amLODipine  10 mg Oral Daily  .  ceFAZolin (ANCEF) IV  2 g Intravenous Q8H  . enoxaparin (LOVENOX) injection  40 mg Subcutaneous Q24H  . glimepiride  4 mg Oral Q breakfast  . hydrochlorothiazide  50 mg Oral Daily  . insulin aspart  0-20 Units Subcutaneous TID WC  . insulin glargine  10 Units Subcutaneous Daily  . lisinopril  5 mg Oral q morning - 10a  . meloxicam  7.5 mg Oral Daily  . metFORMIN  500 mg Oral BID WC  . pantoprazole  20 mg Oral Daily  . pioglitazone  15 mg Oral Daily     Assessment/Plan: s/p Procedure(s): LAPAROSCOPIC REPAIR INCARCARATED VENTRAL HERNIA AND UMBILICAL HERNIA INSERTION OF MESH   POD #1. Laparoscopic repair of incarcerated ventral hernia and umbilical hernia with mesh. Stable surgically Advance diet when nausea resolved Switch to fentanyl due to nausea. Add IV Tylenol 1 days. Ambulate in hall Push incentive spirometry.  Diabetes mellitus. On Amaryl, Actos, metformin. On sliding scale insulin. Will add Lantus 10 units daily. Continue close monitoring.  Hypertension. Seems controlled. Continue present lisinopril, HCTZ, and Norvasc.  Anxiety and former alcohol use. Reportedly quit 10 years  ago.  Hospitalized for acute pancreatitis, July 2014.    @PROBHOSP @  LOS: 1 day    Laticha Ferrucci M 07/21/2014  . .prob

## 2014-07-21 NOTE — Anesthesia Postprocedure Evaluation (Signed)
  Anesthesia Post-op Note  Patient: Sarah [name]  Procedure(s) Performed: Procedure(s) (LRB): LAPAROSCOPIC REPAIR INCARCARATED VENTRAL HERNIA AND UMBILICAL HERNIA (N/A) INSERTION OF MESH (N/A)  Patient Location: PACU  Anesthesia Type: General  Level of Consciousness: awake and alert   Airway and Oxygen Therapy: Patient Spontanous Breathing  Post-op Pain: mild  Post-op Assessment: Post-op Vital signs reviewed, Patient's Cardiovascular Status Stable, Respiratory Function Stable, Patent Airway and No signs of Nausea or vomiting  Last Vitals:  Filed Vitals:   07/21/14 0448  BP: 130/64  Pulse: 88  Temp: 37.2 C  Resp: 18    Post-op Vital Signs: stable   Complications: No apparent anesthesia complications

## 2014-07-22 ENCOUNTER — Ambulatory Visit (HOSPITAL_COMMUNITY): Payer: Medicare Other

## 2014-07-22 DIAGNOSIS — J9601 Acute respiratory failure with hypoxia: Secondary | ICD-10-CM | POA: Diagnosis not present

## 2014-07-22 DIAGNOSIS — K429 Umbilical hernia without obstruction or gangrene: Secondary | ICD-10-CM | POA: Diagnosis present

## 2014-07-22 DIAGNOSIS — J449 Chronic obstructive pulmonary disease, unspecified: Secondary | ICD-10-CM | POA: Diagnosis present

## 2014-07-22 DIAGNOSIS — F419 Anxiety disorder, unspecified: Secondary | ICD-10-CM | POA: Diagnosis present

## 2014-07-22 DIAGNOSIS — E119 Type 2 diabetes mellitus without complications: Secondary | ICD-10-CM | POA: Diagnosis present

## 2014-07-22 DIAGNOSIS — J45909 Unspecified asthma, uncomplicated: Secondary | ICD-10-CM | POA: Diagnosis present

## 2014-07-22 DIAGNOSIS — E669 Obesity, unspecified: Secondary | ICD-10-CM | POA: Diagnosis present

## 2014-07-22 DIAGNOSIS — F17210 Nicotine dependence, cigarettes, uncomplicated: Secondary | ICD-10-CM | POA: Diagnosis present

## 2014-07-22 DIAGNOSIS — K439 Ventral hernia without obstruction or gangrene: Secondary | ICD-10-CM

## 2014-07-22 DIAGNOSIS — K432 Incisional hernia without obstruction or gangrene: Secondary | ICD-10-CM | POA: Diagnosis present

## 2014-07-22 DIAGNOSIS — E876 Hypokalemia: Secondary | ICD-10-CM | POA: Diagnosis not present

## 2014-07-22 DIAGNOSIS — K436 Other and unspecified ventral hernia with obstruction, without gangrene: Secondary | ICD-10-CM | POA: Diagnosis present

## 2014-07-22 DIAGNOSIS — K219 Gastro-esophageal reflux disease without esophagitis: Secondary | ICD-10-CM | POA: Diagnosis present

## 2014-07-22 DIAGNOSIS — I509 Heart failure, unspecified: Secondary | ICD-10-CM

## 2014-07-22 DIAGNOSIS — Z6834 Body mass index (BMI) 34.0-34.9, adult: Secondary | ICD-10-CM | POA: Diagnosis not present

## 2014-07-22 DIAGNOSIS — Z90710 Acquired absence of both cervix and uterus: Secondary | ICD-10-CM | POA: Diagnosis not present

## 2014-07-22 DIAGNOSIS — K567 Ileus, unspecified: Secondary | ICD-10-CM | POA: Diagnosis not present

## 2014-07-22 DIAGNOSIS — Z833 Family history of diabetes mellitus: Secondary | ICD-10-CM | POA: Diagnosis not present

## 2014-07-22 DIAGNOSIS — I10 Essential (primary) hypertension: Secondary | ICD-10-CM | POA: Diagnosis present

## 2014-07-22 HISTORY — PX: TRANSTHORACIC ECHOCARDIOGRAM: SHX275

## 2014-07-22 LAB — GLUCOSE, CAPILLARY
GLUCOSE-CAPILLARY: 144 mg/dL — AB (ref 70–99)
GLUCOSE-CAPILLARY: 87 mg/dL (ref 70–99)
Glucose-Capillary: 135 mg/dL — ABNORMAL HIGH (ref 70–99)
Glucose-Capillary: 105 mg/dL — ABNORMAL HIGH (ref 70–99)

## 2014-07-22 LAB — CBC
HCT: 39.9 % (ref 36.0–46.0)
HEMOGLOBIN: 13.3 g/dL (ref 12.0–15.0)
MCH: 27.1 pg (ref 26.0–34.0)
MCHC: 33.3 g/dL (ref 30.0–36.0)
MCV: 81.3 fL (ref 78.0–100.0)
Platelets: 263 10*3/uL (ref 150–400)
RBC: 4.91 MIL/uL (ref 3.87–5.11)
RDW: 15.5 % (ref 11.5–15.5)
WBC: 8.3 10*3/uL (ref 4.0–10.5)

## 2014-07-22 LAB — BASIC METABOLIC PANEL
ANION GAP: 10 (ref 5–15)
BUN: 10 mg/dL (ref 6–23)
CALCIUM: 9.8 mg/dL (ref 8.4–10.5)
CO2: 31 mmol/L (ref 19–32)
CREATININE: 0.8 mg/dL (ref 0.50–1.10)
Chloride: 98 mmol/L (ref 96–112)
GFR calc non Af Amer: 78 mL/min — ABNORMAL LOW (ref >90)
Glucose, Bld: 136 mg/dL — ABNORMAL HIGH (ref 70–99)
POTASSIUM: 2.9 mmol/L — AB (ref 3.5–5.1)
Sodium: 139 mmol/L (ref 135–145)

## 2014-07-22 LAB — TROPONIN I
Troponin I: <0.03 ng/mL (ref <0.031)
Troponin I: <0.03 ng/mL (ref <0.031)

## 2014-07-22 LAB — BRAIN NATRIURETIC PEPTIDE: B NATRIURETIC PEPTIDE 5: 52 pg/mL (ref 0.0–100.0)

## 2014-07-22 MED ORDER — ALBUTEROL SULFATE (2.5 MG/3ML) 0.083% IN NEBU: 2.5000 mg | INHALATION_SOLUTION | RESPIRATORY_TRACT | Status: DC | PRN

## 2014-07-22 MED ORDER — TIOTROPIUM BROMIDE MONOHYDRATE 18 MCG IN CAPS
18.0000 ug | ORAL_CAPSULE | Freq: Every day | RESPIRATORY_TRACT | Status: DC
  Administered 2014-07-23 – 2014-07-27 (×4): 18 ug via RESPIRATORY_TRACT
  Filled 2014-07-22: qty 5

## 2014-07-22 MED ORDER — POTASSIUM CHLORIDE 10 MEQ/100ML IV SOLN
10.0000 meq | INTRAVENOUS | Status: AC
  Administered 2014-07-22 (×4): 10 meq via INTRAVENOUS
  Filled 2014-07-22 (×6): qty 100

## 2014-07-22 MED ORDER — POLYETHYLENE GLYCOL 3350 17 G PO PACK: 17.0000 g | PACK | Freq: Two times a day (BID) | ORAL | Status: DC

## 2014-07-22 MED ORDER — FENTANYL CITRATE 0.05 MG/ML IJ SOLN
12.5000 ug | INTRAMUSCULAR | Status: DC | PRN
  Administered 2014-07-22 (×3): 12.5 ug via INTRAVENOUS
  Filled 2014-07-22 (×4): qty 2

## 2014-07-22 MED ORDER — PROMETHAZINE HCL 25 MG/ML IJ SOLN
25.0000 mg | INTRAMUSCULAR | Status: DC | PRN
  Administered 2014-07-22 (×3): 25 mg via INTRAVENOUS
  Filled 2014-07-22 (×3): qty 1

## 2014-07-22 MED ORDER — FUROSEMIDE 10 MG/ML IJ SOLN
40.0000 mg | Freq: Once | INTRAMUSCULAR | Status: AC
Start: 2014-07-22 — End: 2014-07-22
  Administered 2014-07-22: 40 mg via INTRAVENOUS
  Filled 2014-07-22: qty 4

## 2014-07-22 NOTE — Progress Notes (Signed)
*  PRELIMINARY RESULTS* Echocardiogram 2D Echocardiogram has been performed.  [name] 07/22/2014, 3:51 PM

## 2014-07-22 NOTE — Progress Notes (Signed)
Clinical Social Work Department BRIEF PSYCHOSOCIAL ASSESSMENT 07/22/2014  Patient:  Sarah [name], Sarah [name]     Account Number:  [id number]     Admit date:  07/20/2014  Clinical Social Worker:  Lacie Scotts  Date/Time:  07/22/2014 02:11 PM  Referred by:  Physician  Date Referred:  07/22/2014 Referred for  SNF Placement   Other Referral:   Interview type:  Family Other interview type:    PSYCHOSOCIAL DATA Living Status:  HUSBAND Admitted from facility:   Level of care:   Primary support name:  Eddie Dibbles Primary support relationship to patient:  SPOUSE Degree of support available:   limited    CURRENT CONCERNS Current Concerns  Post-Acute Placement   Other Concerns:    SOCIAL WORK ASSESSMENT / PLAN Pt is a 59 yr old female living at home with spouse prior to hospitalization. CSW has been consulted for SNF placement. PN reviewed. CSW met with pt and family at bedside. OT is recommending SNF placement at this time. PT eval is pending. Pt / family gave CSW permission to initiate SNF search. Bed offers are pending. CSW will continue to follow to assist with d/c planning to SNF.   Assessment/plan status:  Psychosocial Support/Ongoing Assessment of Needs Other assessment/ plan:   Information/referral to community resources:   SNF list provided. SNF placement vs HH services reviewed. Insurance coverage for SNF reviewed.    PATIENT'S/FAMILY'S RESPONSE TO PLAN OF CARE: Pt has been concerned about managing at home. Pt / family are willing to consider [facility]. D/C planning is ongoing. CSW will meet again with pt / family to review bed offers.    Werner Lean LCSW [phone]

## 2014-07-22 NOTE — Progress Notes (Signed)
Clinical Social Work Department CLINICAL SOCIAL WORK PLACEMENT NOTE 07/22/2014  Patient:  Sarah [name], Sarah [name]  Account Number:  [id number] Admit date:  07/20/2014  Clinical Social Worker:  Werner Lean, LCSW  Date/time:  07/22/2014 02:33 PM  Clinical Social Work is seeking post-discharge placement for this patient at the following level of care:   SKILLED NURSING   (*CSW will update this form in Epic as items are completed)   07/22/2014  Patient/family provided with [facility] Department of Clinical Social Work's list of facilities offering this level of care within the geographic area requested by the patient (or if unable, by the patient's family).  07/22/2014  Patient/family informed of their freedom to choose among providers that offer the needed level of care, that participate in Medicare, Medicaid or managed care program needed by the patient, have an available bed and are willing to accept the patient.    Patient/family informed of [facility]' ownership interest in [facility], as well as of the fact that they are under no obligation to receive care at this facility.  PASARR submitted to EDS on 07/22/2014 PASARR number received on 07/22/2014  FL2 transmitted to all facilities in geographic area requested by pt/family on  07/22/2014 FL2 transmitted to all facilities within larger geographic area on   Patient informed that his/her managed care company has contracts with or will negotiate with  certain facilities, including the following:     Patient/family informed of bed offers received:   Patient chooses bed at  Physician recommends and patient chooses bed at    Patient to be transferred to  on   Patient to be transferred to facility by  Patient and family notified of transfer on  Name of family member notified:    The following physician request were entered in Epic:   Additional Comments:  Werner Lean LCSW [phone]

## 2014-07-22 NOTE — Progress Notes (Signed)
PT Cancellation Note  Patient Details Name: Sarah [name] MRN: [id number] DOB: 03-10-1956   Cancelled Treatment:    Reason Eval/Treat Not Completed: Fatigue/lethargy limiting ability to participate;Other (comment)  Pt extremely lethargic at this time d/t meds, K+ quite low as well at 2.9 today (being replenished at this time); will defer PT and attempt again next day or as schedule permits.   [facility] 07/22/2014, 2:31 PM

## 2014-07-22 NOTE — Progress Notes (Signed)
Occupational Therapy Treatment Patient Details Name: Sarah [name] MRN: [id number] DOB: 1956-06-02 Today's Date: 07/22/2014    History of present illness s/p hernia repair   OT comments  Needs to get to chair!  Follow Up Recommendations  SNF    Equipment Recommendations  Other (comment) (TBD)    Recommendations for Other Services      Precautions / Restrictions Precautions Precautions: Fall Restrictions Weight Bearing Restrictions: No       Mobility Bed Mobility Overal bed mobility: Needs Assistance Bed Mobility: Sit to Supine     Supine to sit: Mod assist;HOB elevated        Transfers Overall transfer level: Needs assistance Equipment used: 1 person hand held assist Transfers: Sit to/from Stand Sit to Stand: Max assist         General transfer comment: sit to stand only    Balance                                   ADL Overall ADL's : Needs assistance/impaired     Grooming: Wash/dry face;Sitting;Set up                                 General ADL Comments: daugthers present and upset with pt for being sick.  Educated family pt had been vomiting for an hour and did not think it was on purpose      Tourist information centre manager   Behavior During Therapy: WFL for tasks assessed/performed Overall Cognitive Status: Within Functional Limits for tasks assessed                       Extremity/Trunk Assessment  Upper Extremity Assessment Upper Extremity Assessment: Generalized weakness            Exercises     Shoulder Instructions       General Comments      Pertinent Vitals/ Pain       Pain Assessment: 0-10 Pain Score: 8  Pain Location: abdomen Pain Descriptors / Indicators: Sore Pain Intervention(s): Limited activity within patient's tolerance;Monitored during session;Repositioned  Home Living Family/patient expects to be discharged to:: Private  residence Living Arrangements: Spouse/significant other   Type of Home: House       Home Layout: One level     Bathroom Shower/Tub: Teacher, early years/pre: Standard     Home Equipment: None          Prior Functioning/Environment Level of Independence: Independent            Frequency Min 2X/week     Progress Toward Goals  OT Goals(current goals can now be found in the care plan section)  Progress towards OT goals: Progressing toward goals  Acute Rehab OT Goals Patient Stated Goal: not be sick and no pain OT Goal Formulation: With patient Time For Goal Achievement: 08/05/14 Potential to Achieve Goals: Good ADL Goals Pt Will Perform Grooming: with supervision;standing Pt Will Perform Lower Body Dressing: with supervision;sit to/from stand Pt Will Transfer to Toilet: with supervision;regular height toilet Pt Will Perform Toileting - Clothing Manipulation and hygiene: with supervision;sit to/from stand  Plan Discharge plan remains appropriate  Co-evaluation                 End of Session     Activity Tolerance Patient limited by fatigue;Patient limited by pain   Patient Left in bed   Nurse Communication Mobility status    Functional Assessment Tool Used: clinical observation Functional Limitation: Self care Self Care Current Status (L9357): At least 60 percent but less than 80 percent impaired, limited or restricted Self Care Goal Status (S1779): At least 1 percent but less than 20 percent impaired, limited or restricted   Time: 0940-0950 OT Time Calculation (min): 10 min  Charges: OT G-codes **NOT FOR INPATIENT CLASS** Functional Assessment Tool Used: clinical observation Functional Limitation: Self care Self Care Current Status (T9030): At least 60 percent but less than 80 percent impaired, limited or restricted Self Care Goal Status (S9233): At least 1 percent but less than 20 percent impaired, limited or restricted OT General  Charges $OT Visit: 1 Procedure OT Evaluation $Initial OT Evaluation Tier I: 1 Procedure OT Treatments $Self Care/Home Management : 8-22 mins  Normand Damron, Thereasa Parkin 07/22/2014, 10:53 AM

## 2014-07-22 NOTE — Progress Notes (Addendum)
2 Days Post-Op  Subjective: Tolerating clear liquids without nausea and vomiting. No stool or flatus reported. Poorly motivated. Fearful of ambulation although did ambulate in hall. Fearful of going home due to lack of support. "I can't do this at home. My bed's too high" Husband present but non-participatory. Lots of anxiety  Afebrile. SPO2 90-92% on 3 L nasal O2. RR 18. Using incentive spirometer. BP 107/56. Occasional tachycardia. Urine output high. Diuresing. IV cut back.  CBGs ranged from 99-124. Well controlled on current regimen.  Objective: Vital signs in last 24 hours: Temp:  [98.3 F (36.8 C)-98.6 F (37 C)] 98.3 F (36.8 C) (02/04 0617) Pulse Rate:  [86-112] 112 (02/04 0617) Resp:  [18] 18 (02/04 0617) BP: (107-125)/(56-65) 107/56 mmHg (02/04 0617) SpO2:  [90 %-93 %] 90 % (02/04 0617) Last BM Date: 07/20/14  Intake/Output from previous day: 02/03 0701 - 02/04 0700 In: 2870 [P.O.:720; I.V.:2150] Out: 4750 [Urine:4750] Intake/Output this shift: Total I/O In: 1200 [I.V.:1200] Out: 1850 [Urine:1850]    EXAM: General appearance: Alert. Answers questions appropriately. Poorly motivated, anxious and fearful. Does not appear to be any significant physical distress. Resp: Lungs are reasonably clear. Somewhat  decreased breath sounds at bases but no rhonchi or wheezes heard. GI: abdomen soft. Appropriately tender. Wounds look good. Obese. Borderline distended.  Hypoactive bowel sounds.  Lab Results:  Results for orders placed or performed during the hospital encounter of 07/20/14 (from the past 24 hour(s))  Glucose, capillary     Status: Abnormal   Collection Time: 07/21/14  8:09 AM  Result Value Ref Range   Glucose-Capillary 124 (H) 70 - 99 mg/dL  Glucose, capillary     Status: None   Collection Time: 07/21/14 11:56 AM  Result Value Ref Range   Glucose-Capillary 99 70 - 99 mg/dL  Glucose, capillary     Status: Abnormal   Collection Time: 07/21/14  4:57 PM   Result Value Ref Range   Glucose-Capillary 68 (L) 70 - 99 mg/dL  Glucose, capillary     Status: Abnormal   Collection Time: 07/21/14 10:10 PM  Result Value Ref Range   Glucose-Capillary 103 (H) 70 - 99 mg/dL     Studies/Results: No results found.  [name] allopurinol  100 mg Oral Daily  . amLODipine  10 mg Oral Daily  . enoxaparin (LOVENOX) injection  40 mg Subcutaneous Q24H  . glimepiride  4 mg Oral Q breakfast  . hydrochlorothiazide  50 mg Oral Daily  . insulin aspart  0-20 Units Subcutaneous TID WC  . insulin glargine  10 Units Subcutaneous Daily  . lisinopril  5 mg Oral q morning - 10a  . meloxicam  7.5 mg Oral Daily  . metFORMIN  500 mg Oral BID WC  . nicotine  21 mg Transdermal Daily  . pantoprazole  20 mg Oral QHS  . pioglitazone  15 mg Oral Daily  . polyethylene glycol  17 g Oral BID     Assessment/Plan: s/p Procedure(s): LAPAROSCOPIC REPAIR INCARCARATED VENTRAL HERNIA AND UMBILICAL HERNIA INSERTION OF MESH   POD #2. Laparoscopic repair of incarcerated ventral hernia and umbilical hernia with mesh. Stable surgically, but poorly motivated. Ileus has not resolved yet but N/V resolved. Advance diet  To FL miralax ordered Switched to fentanyl vs. Percocet  due to nausea.  Ambulate in hall Push incentive spirometry.  Borderline hypoxia. Suspect atalectasis.   Encourage incentive spirometry. Chest x-ray ordered.  Diabetes mellitus. On Amaryl, Actos, metformin. On sliding scale insulin. on Lantus 10 units daily.  Continue close monitoring.  Good control.  Hypertension. Seems controlled. Continue present lisinopril, HCTZ, and Norvasc. Check bmet/CBC.  Anxiety and former alcohol use. Reportedly quit 10 years ago. Add Ativan when necessary  Hospitalized for acute pancreatitis, July 2014.  PT and OT consults ordered.  case management consult ordered to assist with limited social support     @PROBHOSP @  LOS: 2 days    Elleanor Guyett M 07/22/2014  . .prob

## 2014-07-22 NOTE — Progress Notes (Addendum)
Chest x-ray shows COPD and acute congestive heart failure with mild pulmonary vascular congestion.  I have ordered 40 mg of Lasix IV, reduced IV TKO, and plan internal medicine consult with [facility] hospital[facility] i also ordered EKG and cardiac enzymes.  Potassium is 2.9 which may be contributing to her nausea and vomiting.  I have ordered  6 runs of IV  KCl and b-met tomorrow. Plan to check abdominal x-rays as well.(abdominal xray shows small bowel ileus, gas in right colon,non-obstructive pattern)   Edsel Petrin. Dalbert Batman, M.D., [facility] General and Minimally invasive Surgery Breast and Colorectal Surgery Office:   [phone]

## 2014-07-22 NOTE — Consult Note (Signed)
[facility] History and Physical  Sarah [name] OIN:[id number] DOB: [birth year] DOA: 07/20/2014  Referring physician: Dr. Dalbert Batman PCP: Angelica Chessman, MD   Chief Complaint: SOB  HPI: Sarah [name] is a 59 y.o. female  With recent history of elective surgery for ventral hernia. Has had complicated hospital course and that patient has required supplemental oxygen. The patient is currently sedated and unable to provide a history although based on limited questions the patient is able to answer she denies any prior diagnosis to underlying lung conditions. From my discussion with primary care team patient does not have a good history of follow-up with primary care physicians. Family member in room is unable to provide much significant history.  We were consulted for further medical evaluation and recommendations in lieu of of hypoxia requiring supplemental oxygen.   Review of Systems:  Unable to obtain secondary to sedation of patient with limited patient interaction  Past Medical History  Diagnosis Date  . Hypertension   . Asthma   . Gastritis   . Bacterial vaginosis   . GERD (gastroesophageal reflux disease)   . Diabetes type 2, uncontrolled     01/04/13- pt denies having diabetes  . Abdominal wall hernia 01/05/2013  . Pneumonia   . Diabetes mellitus without complication   . Headache   . Arthritis   . Blood clot in vein 2000    left knee after surgery   Past Surgical History  Procedure Laterality Date  . Left knee surgery Left 2000    arthroscopy and then blood clot removed  . Abdominal hysterectomy  1995    complete  . Dilation and curettage of uterus    . Ventral hernia repair N/A 07/20/2014    Procedure: LAPAROSCOPIC REPAIR INCARCARATED VENTRAL HERNIA AND UMBILICAL HERNIA;  Surgeon: Fanny Skates, MD;  Location: [facility];  Service: General;  Laterality: N/A;  . Insertion of mesh N/A 07/20/2014    Procedure: INSERTION OF MESH;  Surgeon: Fanny Skates, MD;  Location: [facility]  ORS;  Service: General;  Laterality: N/A;   Social History:  reports that she has been smoking Cigarettes.  She has a 60 pack-year smoking history. She has never used smokeless tobacco. She reports that she does not drink alcohol or use illicit drugs.  Allergies  Allergen Reactions  . Aspirin     Causes her stomach pain.  . Ibuprofen Other (See Comments)    Causes stomach pain  . Penicillins Other (See Comments)    Localized whelps and redness after injection    Family history - None reported  Prior to Admission medications   Medication Sig Start Date End Date Taking? Authorizing Provider  albuterol (PROVENTIL HFA;VENTOLIN HFA) 108 (90 BASE) MCG/ACT inhaler Inhale 2 puffs into the lungs every 6 (six) hours as needed for wheezing.   Yes Historical Provider, MD  allopurinol (ZYLOPRIM) 100 MG tablet Take 100 mg by mouth daily.   Yes Historical Provider, MD  amLODipine (NORVASC) 10 MG tablet Take 1 tablet (10 mg total) by mouth daily. 02/19/13  Yes Nishant Dhungel, MD  glimepiride (AMARYL) 4 MG tablet Take 4 mg by mouth daily with breakfast.   Yes Historical Provider, MD  hydrochlorothiazide (HYDRODIURIL) 50 MG tablet Take 1 tablet (50 mg total) by mouth daily. 02/19/13  Yes Nishant Dhungel, MD  HYDROcodone-acetaminophen (NORCO/VICODIN) 5-325 MG per tablet Take 2 tablets by mouth every 6 (six) hours as needed for moderate pain or severe pain (pain).   Yes Historical Provider, MD  lisinopril (PRINIVIL,ZESTRIL)  5 MG tablet Take 5 mg by mouth every morning.    Yes Historical Provider, MD  meloxicam (MOBIC) 7.5 MG tablet Take 7.5 mg by mouth daily.   Yes Historical Provider, MD  metFORMIN (GLUCOPHAGE) 500 MG tablet Take 500 mg by mouth 2 (two) times daily with a meal.   Yes Historical Provider, MD  pantoprazole (PROTONIX) 20 MG tablet Take 1 tablet (20 mg total) by mouth daily. 05/24/14  Yes Malvin Johns, MD  pioglitazone (ACTOS) 15 MG tablet Take 15 mg by mouth daily.   Yes Historical Provider, MD    azithromycin (ZITHROMAX Z-PAK) 250 MG tablet 2 po day one, then 1 daily x 4 days 07/06/14   Veryl Speak, MD  glimepiride (AMARYL) 1 MG tablet Take 1 tablet (1 mg total) by mouth daily before breakfast. Patient not taking: Reported on 06/29/2014 02/19/13   Flonnie Overman Dhungel, MD  meclizine (ANTIVERT) 25 MG tablet Take 1 tablet (25 mg total) by mouth 3 (three) times daily as needed for dizziness. 07/06/14   Veryl Speak, MD  naproxen (NAPROSYN) 500 MG tablet Take 1 tablet (500 mg total) by mouth 2 (two) times daily. Patient not taking: Reported on 05/24/2014 10/22/13   Ruthell Rummage Dammen, PA-C  oxyCODONE-acetaminophen (PERCOCET/ROXICET) 5-325 MG per tablet Take 1-2 tablets by mouth every 6 (six) hours as needed for moderate pain or severe pain. 06/02/14   Houston Siren III, MD  sucralfate (CARAFATE) 1 G tablet Take 1 tablet (1 g total) by mouth 4 (four) times daily -  with meals and at bedtime. Patient not taking: Reported on 06/29/2014 05/24/14   Malvin Johns, MD   Physical Exam: Filed Vitals:   07/21/14 1400 07/21/14 2128 07/22/14 0617 07/22/14 1347  BP: 125/65 124/63 107/56 157/73  Pulse: 86 96 112 93  Temp: 98.6 F (37 C) 98.5 F (36.9 C) 98.3 F (36.8 C) 98.2 F (36.8 C)  TempSrc: Oral Oral Oral Oral  Resp: 18 18 18 18   Height:      Weight:      SpO2: 93% 92% 90% 91%    Wt Readings from Last 3 Encounters:  07/20/14 100.699 kg (222 lb)  05/24/14 108.863 kg (240 lb)  02/19/13 103.874 kg (229 lb)    General: Arousable, in no acute distress. Somnolent Eyes: PERRL, normal lids, irises & conjunctiva ENT: grossly normal hearing, lips & tongue Neck: no LAD, masses or thyromegaly Cardiovascular: RRR, no m/r/g. No LE edema. Respiratory: Equal chest rise, no wheezes, nasal cannula in place. Difficult exam secondary to recent operation with limited cooperation of patient Abdomen: soft, nt, nd Skin: no rash or induration seen on limited exam Musculoskeletal: grossly normal tone  BUE/BLE Psychiatric: Unable to assess secondary to hypersomnolence with limited responses to questions Neurologic: Somnolent moves extremities equally .          Labs on Admission:  Basic Metabolic Panel:  Recent Labs Lab 07/22/14 0750  NA 139  K 2.9*  CL 98  CO2 31  GLUCOSE 136*  BUN 10  CREATININE 0.80  CALCIUM 9.8   Liver Function Tests: No results for input(s): AST, ALT, ALKPHOS, BILITOT, PROT, ALBUMIN in the last 168 hours. No results for input(s): LIPASE, AMYLASE in the last 168 hours. No results for input(s): AMMONIA in the last 168 hours. CBC:  Recent Labs Lab 07/22/14 0750  WBC 8.3  HGB 13.3  HCT 39.9  MCV 81.3  PLT 263   Cardiac Enzymes:  Recent Labs Lab 07/22/14 1210  TROPONINI <0.03  BNP (last 3 results)  Recent Labs  07/22/14 1210  BNP 52.0    ProBNP (last 3 results) No results for input(s): PROBNP in the last 8760 hours.  CBG:  Recent Labs Lab 07/21/14 1156 07/21/14 1657 07/21/14 2210 07/22/14 0722 07/22/14 1228  GLUCAP 99 68* 103* 135* 144*    Radiological Exams on Admission: Dg Chest 2 View  07/22/2014   CLINICAL DATA:  Status post laparoscopic repair of an incarcerated ventral and umbilical hernia with best insertion; complaining of shortness of breath and weakness, history of diabetes and asthma, long-term smoker  EXAM: CHEST  2 VIEW  COMPARISON:  PA and lateral chest x-ray of July 15, 2014  FINDINGS: The lungs are reasonably well inflated. The cardiopericardial silhouette is enlarged. The pulmonary vascularity is engorged. There are increased interstitial densities in both lung bases. Is no alveolar infiltrate. There is no significant pleural effusion.  IMPRESSION: COPD with acute CHF and mild pulmonary interstitial edema. There is bibasilar atelectasis and scarring.   Electronically Signed   By: David  Martinique   On: 07/22/2014 10:48   Dg Abd 2 Views  07/22/2014   CLINICAL DATA:  Mid and right lower abdominal pain 1 day  status post repair of an incarcerated hernia, nausea and vomiting  EXAM: ABDOMEN - 2 VIEW  COMPARISON:  CT scan of the abdomen and pelvis of June 02, 2014.  FINDINGS: There is mild gaseous distention of the stomach. There are distended gas-filled small bowel loops in the left upper quadrant. The. decubitus film reveals numerous air-fluid levels within small bowel loops. There is gas in the right colon. There is only a small amount of gas in the rectum.  IMPRESSION: The bowel gas pattern is consistent with small bowel ileus. No markedly distended bowel loops are demonstrated. Nasogastric suction may be useful.   Electronically Signed   By: David  Martinique   On: 07/22/2014 12:25    Assessment/Plan Principal Problem:   Abdominal wall hernia/incarcerated ventral hernia - s/p lap repair  Acute respiratory failure with hypoxia - Etiology uncertain. Among etiologies considered currently considering COPD exacerbation or acute CHF exacerbation. Based on lab work with a BNP of less than 100 currently acute CHF less likely. - As such at this point suspect COPD is most likely cause. Question is whether patient has been hypoxic as outpatient for some time as patient does not follow-up regularly with a primary care physician. At this juncture we'll continue bronchodilators and add Spiriva. - With improvement in condition would plan on sending patient to primary care or pulmonologist as outpatient for pulmonary function testing. - If still hypoxic would plan on setting up home oxygen for the patient should she require it - We'll obtain 3 sets of troponin and echocardiogram   Code Status: full DVT Prophylaxis: per primary given recent operation Family Communication: discussed with patient directly Disposition Plan: pending improvement in condition  Time spent: > 25 minutes  [place], [facility] Pager [phone]

## 2014-07-22 NOTE — Evaluation (Signed)
Occupational Therapy Evaluation Patient Details Name: Sarah [name] MRN: [id number] DOB: [birth year] Today's Date: 07/22/2014    History of Present Illness s/p hernia repair   Clinical Impression   Pt admitted with hernia repair. Pt currently with functional limitations due to the deficits listed below (see OT Problem List).  Pt will benefit from skilled OT to increase their safety and independence with ADL and functional mobility for ADL to facilitate discharge to venue listed below.      Follow Up Recommendations  SNF    Equipment Recommendations  Other (comment) (TBD)    Recommendations for Other Services       Precautions / Restrictions Restrictions Weight Bearing Restrictions: No      Mobility Bed Mobility Overal bed mobility: Needs Assistance Bed Mobility: Supine to Sit     Supine to sit: Mod assist;HOB elevated        Transfers                 General transfer comment: did not perform    Balance                                            ADL Overall ADL's : Needs assistance/impaired                                       General ADL Comments: limited assessment due to n/v.  Will assess next OT visit.  Pt currently needing significantt A.Pts husband not very hands on. Pt is worried about home and OT agrees     Vision                     Perception     Praxis      Pertinent Vitals/Pain Pain Assessment: 0-10 Pain Score: 10-Worst pain ever Pain Location: abdomen Pain Descriptors / Indicators: Sore Pain Intervention(s): Limited activity within patient's tolerance;Monitored during session;Repositioned;Patient requesting pain meds-RN notified     Hand Dominance     Extremity/Trunk Assessment Upper Extremity Assessment Upper Extremity Assessment: Generalized weakness           Communication Communication Communication: No difficulties   Cognition Arousal/Alertness: Awake/alert Behavior  During Therapy: WFL for tasks assessed/performed Overall Cognitive Status: Within Functional Limits for tasks assessed                     General Comments       Exercises       Shoulder Instructions      Home Living Family/patient expects to be discharged to:: Private residence Living Arrangements: Spouse/significant other   Type of Home: House       Home Layout: One level     Bathroom Shower/Tub: Teacher, early years/pre: Standard     Home Equipment: None          Prior Functioning/Environment Level of Independence: Independent             OT Diagnosis: Generalized weakness;Acute pain   OT Problem List: Decreased activity tolerance;Impaired balance (sitting and/or standing);Decreased strength   OT Treatment/Interventions: Self-care/ADL training;DME and/or AE instruction;Patient/family education    OT Goals(Current goals can be found in the care plan section) Acute Rehab OT Goals Patient Stated Goal: not be sick and no pain OT  Goal Formulation: With patient Time For Goal Achievement: 08/05/14 Potential to Achieve Goals: Good  OT Frequency: Min 2X/week   Barriers to D/C: Decreased caregiver support          Co-evaluation              End of Session Nurse Communication: Mobility status  Activity Tolerance: Patient limited by pain Patient left: in bed   Time: 5188-4166 OT Time Calculation (min): 25 min Charges:  OT General Charges $OT Visit: 1 Procedure OT Evaluation $Initial OT Evaluation Tier I: 1 Procedure OT Treatments $Self Care/Home Management : 8-22 mins G-Codes: OT G-codes **NOT FOR INPATIENT CLASS** Functional Assessment Tool Used: clinical observation Functional Limitation: Self care Self Care Current Status (A6301): At least 60 percent but less than 80 percent impaired, limited or restricted Self Care Goal Status (S0109): At least 1 percent but less than 20 percent impaired, limited or restricted  [place],  Edwena Felty D 07/22/2014, 10:09 AM

## 2014-07-23 DIAGNOSIS — J449 Chronic obstructive pulmonary disease, unspecified: Secondary | ICD-10-CM

## 2014-07-23 DIAGNOSIS — J9601 Acute respiratory failure with hypoxia: Secondary | ICD-10-CM

## 2014-07-23 DIAGNOSIS — K913 Postprocedural intestinal obstruction: Secondary | ICD-10-CM

## 2014-07-23 LAB — BASIC METABOLIC PANEL
Anion gap: 10 (ref 5–15)
BUN: 13 mg/dL (ref 6–23)
CO2: 30 mmol/L (ref 19–32)
CREATININE: 0.78 mg/dL (ref 0.50–1.10)
Calcium: 9.4 mg/dL (ref 8.4–10.5)
Chloride: 99 mmol/L (ref 96–112)
GFR calc Af Amer: >90 mL/min (ref >90)
GFR calc non Af Amer: 88 mL/min — ABNORMAL LOW (ref >90)
GLUCOSE: 112 mg/dL — AB (ref 70–99)
Potassium: 3.8 mmol/L (ref 3.5–5.1)
Sodium: 139 mmol/L (ref 135–145)

## 2014-07-23 LAB — TROPONIN I

## 2014-07-23 LAB — GLUCOSE, CAPILLARY
GLUCOSE-CAPILLARY: 138 mg/dL — AB (ref 70–99)
GLUCOSE-CAPILLARY: 100 mg/dL — AB (ref 70–99)
Glucose-Capillary: 106 mg/dL — ABNORMAL HIGH (ref 70–99)
Glucose-Capillary: 91 mg/dL (ref 70–99)

## 2014-07-23 MED ORDER — ARFORMOTEROL TARTRATE 15 MCG/2ML IN NEBU
15.0000 ug | INHALATION_SOLUTION | Freq: Two times a day (BID) | RESPIRATORY_TRACT | Status: DC
  Administered 2014-07-23 – 2014-07-25 (×4): 15 ug via RESPIRATORY_TRACT
  Filled 2014-07-23 (×6): qty 2

## 2014-07-23 MED ORDER — POTASSIUM CHLORIDE 10 MEQ/100ML IV SOLN
10.0000 meq | INTRAVENOUS | Status: DC
Start: 2014-07-23 — End: 2014-07-23
  Administered 2014-07-23 (×3): 10 meq via INTRAVENOUS
  Filled 2014-07-23 (×4): qty 100

## 2014-07-23 MED ORDER — FLUTICASONE PROPIONATE 50 MCG/ACT NA SUSP
1.0000 | Freq: Every day | NASAL | Status: DC
  Administered 2014-07-23 – 2014-07-27 (×5): 1 via NASAL
  Filled 2014-07-23: qty 16

## 2014-07-23 MED ORDER — HYDROCODONE-ACETAMINOPHEN 5-325 MG PO TABS
1.0000 | ORAL_TABLET | ORAL | Status: DC | PRN
  Administered 2014-07-23 – 2014-07-26 (×7): 1 via ORAL
  Filled 2014-07-23 (×7): qty 1

## 2014-07-23 MED ORDER — MILK AND MOLASSES ENEMA
1.0000 | Freq: Two times a day (BID) | RECTAL | Status: AC
  Administered 2014-07-23 (×2): 250 mL via RECTAL
  Filled 2014-07-23 (×2): qty 250

## 2014-07-23 MED ORDER — BUDESONIDE 0.25 MG/2ML IN SUSP
0.2500 mg | Freq: Two times a day (BID) | RESPIRATORY_TRACT | Status: DC
  Administered 2014-07-23 – 2014-07-25 (×4): 0.25 mg via RESPIRATORY_TRACT
  Filled 2014-07-23 (×4): qty 2

## 2014-07-23 MED ORDER — LIVING WELL WITH DIABETES BOOK
Freq: Once | Status: AC
  Administered 2014-07-23: 17:00:00
  Filled 2014-07-23: qty 1

## 2014-07-23 MED ORDER — PROMETHAZINE HCL 25 MG/ML IJ SOLN: 12.5000 mg | INTRAMUSCULAR | Status: DC | PRN

## 2014-07-23 MED ORDER — FENTANYL CITRATE 0.05 MG/ML IJ SOLN
12.5000 ug | INTRAMUSCULAR | Status: DC | PRN
  Administered 2014-07-23 – 2014-07-24 (×4): 12.5 ug via INTRAVENOUS
  Filled 2014-07-23 (×4): qty 2

## 2014-07-23 NOTE — Clinical Social Work Note (Cosign Needed)
CSW spoke with patient and her husband at bedside. CSW provided them with SNF bed offers that have been rec'd thus far-neither of them indicate they are familiar with SNF's- they live in [place] [facility] [place] [facility]. CSW suggested they consider SNF's and offered to call daughter as well. CSW contacted Abigail Butts (daughter [phone]) to update and provide SNF offers. She informed me that she is in the ER at [facility] with her son and cannot talk now- CSW asked if there was other family to be contacted and she stated there was not and hung up on CSW. Patient was observed walking in the hall with PT when she had an "episode" with PT. At this time, PT is recommending SNF and CSW will continue to work on this plan. Patient's husband offered very little interaction and no eye contact throughout [place] visit- per staff, he has dementia and has been staying at bedside since her surgery and that the patient has been preparing his meds and sharing her meals with him. CSW mentioned concerns related to this and his possible care needs which they plan to have unit leadership to follow up on.   Eduard Clos, MSW, [place]

## 2014-07-23 NOTE — Progress Notes (Signed)
Patient ID: Sarah [name], female   DOB: 17-Dec-1955, 59 y.o.   MRN: [id number] [facility] PROGRESS NOTE  Sarah [name] [id number]:[id number] DOB: 08-18-1955 DOA: 07/20/2014 PCP: Angelica Chessman, MD  Brief narrative:    59 y.o. 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 Surgery noted that patient has incarcerated ventral hernia above the umbilicus containing only fat and it was felt at that time that the patient's pain is because of pancreatitis. She was advised to follow-up with surgery outpatient but she was lost to follow-up. Patient presented to [facility] for evaluation of ongoing abdominal pain and she wanted to proceed with surgery. Patient is status post laparoscopic repair of incarcerated ventral hernia and umbilical hernia with insertion of mesh, done 07/20/2014. Her hospital course is somewhat complicated with hypoxia but as noted patient has history of COPD and is an active smoker which could contribute to her hypoxia. [facility] was consulted for management of hypoxia. Chest x-ray was done 07/22/2014 and showed COPD and CHF. 2-D echo showed preserved ejection fraction with grade 1 diastolic dysfunction.   Assessment/Plan:    Principal Problem:   Abdominal wall hernia - Status post laparoscopic repair of incarcerated ventral hernia and umbilical hernia with placement of mesh, done 07/20/2014. - Management per surgery.  Active Problems:   Acute respiratory failure with hypoxia / COPD - Respiratory status is stable. Chest x-ray showed CHF and COPD although COPD seems to be more likely. 2-D echo showed preserved ejection fraction with grade 1 diastolic dysfunction. - We'll change nebulizer treatment to help with COPD management. Stop albuterol and use Pulmicort and Brovana nebulizer treatment twice  daily. Continue Spiriva inhaler daily. Start Flonase nasal spray daily. We will see if this regimen helps patient little bit better. - COPD gold alert order placed     Ileus / hypokalemia - Since recent surgery, likely because of pain medications. - Pain management efforts per surgery. Supplemented potassium and this morning potassium is 3.8.    Diabetes mellitus, no mention is controlled or if complicated - We will obtain A1c on this admission. Patient is on extensive regimen by mouth and insulin regimen for diabetes control. - It would be reasonable to stop Actos because of worrisome findings of possible pulmonary edema on chest x-ray. Actos may worsen respiratory status.  - We will continue metformin, glimepiride and insulin regimen. - Diabetic coordinator consult placed, their input is appreciated    Essential hypertension - We will continue Norvasc 10 mg daily, HCTZ 50 mg daily, lisinopril 5 mg daily. - Continue to monitor renal function while patient is on these medications. Patient is also on metformin for diabetes and combining this with HCTZ and lisinopril all these are nephrotoxic agents which can contribute to worsening renal function.   DVT Prophylaxis  - Lovenox subQ ordered   Code Status: Full.  Family Communication:  plan of care discussed with the patient Disposition Plan: Home when stable.   IV access:  Peripheral IV  Procedures and diagnostic studies:    Dg Chest 2 View 07/22/2014    COPD with acute CHF and mild pulmonary interstitial edema. There is bibasilar atelectasis and scarring.    Dg Abd 2 Views 07/22/2014   The bowel gas pattern is consistent with small bowel ileus. No markedly distended bowel loops are  demonstrated. Nasogastric suction may be useful.     Medical Consultants:  Surgery primary [facility] consultant  Other Consultants:  None   IAnti-Infectives:   None    Leisa Lenz, MD  [facility] Pager [phone]  If 7PM-7AM, please contact  night-coverage [email or web address] Password [email or web address] 07/23/2014, 1:02 PM   LOS: 3 days    HPI/Subjective: No acute overnight events.  Objective: Filed Vitals:   07/22/14 2126 07/23/14 0642 07/23/14 0818 07/23/14 1115  BP: 87/55 152/77  125/65  Pulse: 103 98    Temp: 99.7 F (37.6 C) 98.5 F (36.9 C)    TempSrc: Oral Oral    Resp: 20 18    Height:      Weight:      SpO2: 91% 95% 95%     Intake/Output Summary (Last 24 hours) at 07/23/14 1302 Last data filed at 07/23/14 1000  Gross per 24 hour  Intake    762 ml  Output   1425 ml  Net   -663 ml    Exam:   General:  Pt is alert, little drowsy this am, no distress  Cardiovascular: Regular rate and rhythm, S1/S2 (+)  Respiratory: bilateral air entry, no wheezing   Abdomen: tender in mid abdomen, distention, (+) BS  Extremities: No edema, pulses DP and PT palpable bilaterally  Neuro: Grossly nonfocal  Data Reviewed: Basic Metabolic Panel:  Recent Labs Lab 07/22/14 0750 07/23/14 0454  NA 139 139  K 2.9* 3.8  CL 98 99  CO2 31 30  GLUCOSE 136* 112*  BUN 10 13  CREATININE 0.80 0.78  CALCIUM 9.8 9.4   Liver Function Tests: No results for input(s): AST, ALT, ALKPHOS, BILITOT, PROT, ALBUMIN in the last 168 hours. No results for input(s): LIPASE, AMYLASE in the last 168 hours. No results for input(s): AMMONIA in the last 168 hours. CBC:  Recent Labs Lab 07/22/14 0750  WBC 8.3  HGB 13.3  HCT 39.9  MCV 81.3  PLT 263   Cardiac Enzymes:  Recent Labs Lab 07/22/14 1210 07/22/14 1701 07/22/14 2330  TROPONINI <0.03 <0.03 <0.03   BNP: Invalid input(s): POCBNP CBG:  Recent Labs Lab 07/22/14 1228 07/22/14 1628 07/22/14 2217 07/23/14 0907 07/23/14 1108  GLUCAP 144* 105* 87 106* 138*    No results found for this or any previous visit (from the past 240 hour(s)).   Scheduled Meds: . allopurinol  100 mg Oral Daily  . amLODipine  10 mg Oral Daily  . enoxaparin (LOVENOX) injection  40 mg Subcutaneous  Q24H  . glimepiride  4 mg Oral Q breakfast  . hydrochlorothiazide  50 mg Oral Daily  . insulin aspart  0-20 Units Subcutaneous TID WC  . insulin glargine  10 Units Subcutaneous Daily  . lisinopril  5 mg Oral q morning - 10a  . meloxicam  7.5 mg Oral Daily  . metFORMIN  500 mg Oral BID WC  . milk and molasses  1 enema Rectal BID  . nicotine  21 mg Transdermal Daily  . pantoprazole  20 mg Oral QHS  . pioglitazone  15 mg Oral Daily  . tiotropium  18 mcg Inhalation Daily   Continuous Infusions: . 0.9 % NaCl with KCl 20 mEq / L 10 mL/hr at 07/22/14 1228

## 2014-07-23 NOTE — Progress Notes (Addendum)
3 Days Post-Op  Subjective:  Stable and alert this morning. Afebrile. Normotensive. No tachycardia. Doesn't seem to be in a lot of pain.  Phenergan caused significant sedation and she slept a lot. Had nausea and vomiting yesterday but none overnight. Discussed with nursing staff. No stool or flatus. Remains fearful and with chronic anxiety overlay. Did not ambulate at all yesterday  Hypokalemia and small bowel ileus identified on x-ray and lab yesterday. After 6 runs of KCL her K=3.8 this morning. WBC normal.  Chest x-ray yesterday suggested congestive heart failure, but echocardiogram shows normal EF and only grade 1 diastolic dysfunction. Cardiac enzymes negative. Agree that hypoxia likely due to COPD and active tobacco abuse. This was discussed with patient and she was strongly encouraged to stop smoking. She says she doesn't know if she can do this.  Appreciate internal medicine consultation and advice.  PT, OT, and social work involved.  Appreciate their input and advice.  Patient and social work in favor of SNF or rehabilitation facility.  FL 2 signed this morning.    Objective: Vital signs in last 24 hours: Temp:  [98.2 F (36.8 C)-99.7 F (37.6 C)] 98.5 F (36.9 C) (02/05 0642) Pulse Rate:  [93-103] 98 (02/05 0642) Resp:  [18-20] 18 (02/05 0642) BP: (87-157)/(55-77) 152/77 mmHg (02/05 0642) SpO2:  [91 %-95 %] 95 % (02/05 0642) Last BM Date: 07/20/14  Intake/Output from previous day: 02/04 0701 - 02/05 0700 In: 1162 [P.O.:240; I.V.:822; IV Piggyback:100] Out: 1610 [Urine:1425] Intake/Output this shift:    General appearance: Awake and alert. Other than severe anxiety mental status is normal. Doesn't appear to be in any significant physical distress Resp: Lungs are fairly clear. Some decreased breath sounds at bases but no wheeze or rhonchi. GI: Abdomen is generally soft, not distended. Wounds look good. Appropriate incisional tenderness but no signs of any  peritoneal irritation. Minimal bowel sounds.  Lab Results:  Results for orders placed or performed during the hospital encounter of 07/20/14 (from the past 24 hour(s))  Glucose, capillary     Status: Abnormal   Collection Time: 07/22/14  7:22 AM  Result Value Ref Range   Glucose-Capillary 135 (H) 70 - 99 mg/dL  CBC     Status: None   Collection Time: 07/22/14  7:50 AM  Result Value Ref Range   WBC 8.3 4.0 - 10.5 K/uL   RBC 4.91 3.87 - 5.11 MIL/uL   Hemoglobin 13.3 12.0 - 15.0 g/dL   HCT 39.9 36.0 - 46.0 %   MCV 81.3 78.0 - 100.0 fL   MCH 27.1 26.0 - 34.0 pg   MCHC 33.3 30.0 - 36.0 g/dL   RDW 15.5 11.5 - 15.5 %   Platelets 263 150 - 400 K/uL  Basic metabolic panel     Status: Abnormal   Collection Time: 07/22/14  7:50 AM  Result Value Ref Range   Sodium 139 135 - 145 mmol/L   Potassium 2.9 (L) 3.5 - 5.1 mmol/L   Chloride 98 96 - 112 mmol/L   CO2 31 19 - 32 mmol/L   Glucose, Bld 136 (H) 70 - 99 mg/dL   BUN 10 6 - 23 mg/dL   Creatinine, Ser 0.80 0.50 - 1.10 mg/dL   Calcium 9.8 8.4 - 10.5 mg/dL   GFR calc non Af Amer 78 (L) >90 mL/min   GFR calc Af Amer >90 >90 mL/min   Anion gap 10 5 - 15  Troponin I (q 6hr x 3)     Status:  None   Collection Time: 07/22/14 12:10 PM  Result Value Ref Range   Troponin I <0.03 <0.031 ng/mL  Brain natriuretic peptide     Status: None   Collection Time: 07/22/14 12:10 PM  Result Value Ref Range   B Natriuretic Peptide 52.0 0.0 - 100.0 pg/mL  Glucose, capillary     Status: Abnormal   Collection Time: 07/22/14 12:28 PM  Result Value Ref Range   Glucose-Capillary 144 (H) 70 - 99 mg/dL  Glucose, capillary     Status: Abnormal   Collection Time: 07/22/14  4:28 PM  Result Value Ref Range   Glucose-Capillary 105 (H) 70 - 99 mg/dL  Troponin I (q 6hr x 3)     Status: None   Collection Time: 07/22/14  5:01 PM  Result Value Ref Range   Troponin I <0.03 <0.031 ng/mL  Glucose, capillary     Status: None   Collection Time: 07/22/14 10:17 PM  Result  Value Ref Range   Glucose-Capillary 87 70 - 99 mg/dL  Troponin I (q 6hr x 3)     Status: None   Collection Time: 07/22/14 11:30 PM  Result Value Ref Range   Troponin I <0.03 <0.031 ng/mL  Basic metabolic panel     Status: Abnormal   Collection Time: 07/23/14  4:54 AM  Result Value Ref Range   Sodium 139 135 - 145 mmol/L   Potassium 3.8 3.5 - 5.1 mmol/L   Chloride 99 96 - 112 mmol/L   CO2 30 19 - 32 mmol/L   Glucose, Bld 112 (H) 70 - 99 mg/dL   BUN 13 6 - 23 mg/dL   Creatinine, Ser 0.78 0.50 - 1.10 mg/dL   Calcium 9.4 8.4 - 10.5 mg/dL   GFR calc non Af Amer 88 (L) >90 mL/min   GFR calc Af Amer >90 >90 mL/min   Anion gap 10 5 - 15     Studies/Results: Dg Chest 2 View  07/22/2014   CLINICAL DATA:  Status post laparoscopic repair of an incarcerated ventral and umbilical hernia with best insertion; complaining of shortness of breath and weakness, history of diabetes and asthma, long-term smoker  EXAM: CHEST  2 VIEW  COMPARISON:  PA and lateral chest x-ray of July 15, 2014  FINDINGS: The lungs are reasonably well inflated. The cardiopericardial silhouette is enlarged. The pulmonary vascularity is engorged. There are increased interstitial densities in both lung bases. Is no alveolar infiltrate. There is no significant pleural effusion.  IMPRESSION: COPD with acute CHF and mild pulmonary interstitial edema. There is bibasilar atelectasis and scarring.   Electronically Signed   By: David  Martinique   On: 07/22/2014 10:48   Dg Abd 2 Views  07/22/2014   CLINICAL DATA:  Mid and right lower abdominal pain 1 day status post repair of an incarcerated hernia, nausea and vomiting  EXAM: ABDOMEN - 2 VIEW  COMPARISON:  CT scan of the abdomen and pelvis of June 02, 2014.  FINDINGS: There is mild gaseous distention of the stomach. There are distended gas-filled small bowel loops in the left upper quadrant. The. decubitus film reveals numerous air-fluid levels within small bowel loops. There is gas in the  right colon. There is only a small amount of gas in the rectum.  IMPRESSION: The bowel gas pattern is consistent with small bowel ileus. No markedly distended bowel loops are demonstrated. Nasogastric suction may be useful.   Electronically Signed   By: David  [place]   On: 07/22/2014 12:25    .  allopurinol  100 mg Oral Daily  . amLODipine  10 mg Oral Daily  . enoxaparin (LOVENOX) injection  40 mg Subcutaneous Q24H  . glimepiride  4 mg Oral Q breakfast  . hydrochlorothiazide  50 mg Oral Daily  . insulin aspart  0-20 Units Subcutaneous TID WC  . insulin glargine  10 Units Subcutaneous Daily  . lisinopril  5 mg Oral q morning - 10a  . meloxicam  7.5 mg Oral Daily  . metFORMIN  500 mg Oral BID WC  . milk and molasses  1 enema Rectal BID  . nicotine  21 mg Transdermal Daily  . pantoprazole  20 mg Oral QHS  . pioglitazone  15 mg Oral Daily  . polyethylene glycol  17 g Oral BID  . potassium chloride  10 mEq Intravenous Q1 Hr x 4  . tiotropium  18 mcg Inhalation Daily     Assessment/Plan: s/p Procedure(s): LAPAROSCOPIC REPAIR INCARCARATED VENTRAL HERNIA AND UMBILICAL HERNIA INSERTION OF MESH  POD #3. Laparoscopic repair of incarcerated ventral hernia and umbilical hernia with mesh. Stable surgically, but poorly motivated. "I'm scared to walk. I'm scared to eat." Ileus - suspect hypokalemia has contributed to this. We'll continue to supplement potassium to raise potassium level to 4.0 or greater.. Advance diet To FL miralax declined by patient yesterday. Try milk and molasses enemas twice today. Decrease fentanyl dose, Phenergan dose, and switch from oxycodone to hydrocodone to decrease sedation.  Ambulate in hall Push incentive spirometry.  Defer management of medical problems to [facility]. Greatly appreciate their help. Has been followed intermittantly at [facility] as [facility].  Borderline hypoxia. 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 Push ambulation.  Inhalation therapy. Incentive spirometry. Continue supplemental O2.  Diabetes mellitus. On Amaryl, Actos, metformin. On sliding scale insulin. on Lantus 10 units daily. Continue close monitoring. Good control.  Hypertension.    152/77.  Seems controlled. Continue present lisinopril, HCTZ, and Norvasc. BUN 13. Creatinine 0.78. Potassium 3.8.  Anxiety and former alcohol use. Reportedly quit 10 years ago.  Does not act like alcohol withdrawal. Add Ativan when necessary.  Avoid oversedation.  Hospitalized for acute pancreatitis, July 2014.  Continue PT and OT.  rehabilitation or SNF placement. FL-2  signed.   @PROBHOSP @  LOS: 3 days     Jeret Goyer M 07/23/2014  . .prob

## 2014-07-23 NOTE — Progress Notes (Signed)
Inpatient Diabetes Program Recommendations  AACE/ADA: New Consensus Statement on Inpatient Glycemic Control (2013)  Target Ranges:  Prepandial:   less than 140 mg/dL      Peak postprandial:   less than 180 mg/dL (1-2 hours)      Critically ill patients:  140 - 180 mg/dL   Reason for Visit: Diabetes Consult Diabetes history: DM2 Outpatient Diabetes medications: metformin 500 mg bid and amaryl 4 mg QAM Current orders for Inpatient glycemic control: Lantus 10 QD and Novolog resistant tidwc  Pt states she checks blood sugars 2x/day at home and keeps logbook. Blood sugars usually under 180 mg/dL. Has had diabetes education in the past.  HgbA1C results pending. Spoke with pt regarding importance of good blood sugar control to prevent long-term complications. Pt states her blood sugars go up only when she eats chocolate cake and cookies. States she knows what to do. Will send Living Well With Diabetes book and encouraged pt to view diabetes videos on pt ed channel. Stressed importance of f/u with PCP regarding diabetes management.  Will continue to follow. Thank you. Lorenda Peck, RD, LDN, CDE Inpatient Diabetes Coordinator [phone]

## 2014-07-23 NOTE — Evaluation (Signed)
Physical Therapy Evaluation Patient Details Name: Sarah [name] MRN: [id number] DOB: [birth year] Today's Date: 07/23/2014   History of Present Illness  s/p hernia repair  Clinical Impression  Pt admitted with above diagnosis. Pt currently with functional limitations due to the deficits listed below (see PT Problem List).  Pt will benefit from skilled PT to increase their independence and safety with mobility to allow discharge to the venue listed below.   Pt moving at MIN to MIN/guard A, but did have syncopal episode during gait and w/c had to be brought to patient to return to room.  Pt had come to and was able to transfer back to recliner.  Recommend SNF for more rehab before d/c home.  Per pt, husband was in the hospital last week and he would be unable to care for her at current level.     Follow Up Recommendations SNF    Equipment Recommendations       Recommendations for Other Services       Precautions / Restrictions Restrictions Weight Bearing Restrictions: No      Mobility  Bed Mobility               General bed mobility comments: Pt up in chair upon arrival  Transfers     Transfers: Sit to/from Stand Sit to Stand: Min guard         General transfer comment: cues for hand placement  Ambulation/Gait Ambulation/Gait assistance: Min guard;Total assist;+2 physical assistance (TOTAL for syncopal episode) Ambulation Distance (Feet): 120 Feet Assistive device: Rolling walker (2 wheeled) Gait Pattern/deviations: Step-through pattern Gait velocity: cues to decrease speed   General Gait Details: Pt was ambulating with RW and had wanted to continue ambulation, but were returning to pt's room per PT's assessment, when pt became unresponsive.  PT was able to use gait belt to support pt while tech got w/c and nursing arrived to assess pt.  Pt came to and was pushed back to room via w/c.  BP 126/65 and blood sugar was 138.  Pt was able to transfer back to recliner with  MIN/guard.  Stairs            Wheelchair Mobility    Modified Rankin (Stroke Patients Only)       Balance Overall balance assessment: Needs assistance           Standing balance-Leahy Scale: Fair                               Pertinent Vitals/Pain Pain Assessment: 0-10 Pain Score: 7  Pain Location: abdomen and L hand from IV Pain Intervention(s): Limited activity within patient's tolerance;Repositioned;Monitored during session    [place] expects to be discharged to:: Private residence Living Arrangements: Spouse/significant other   Type of Home: House       Home Layout: One level Home Equipment: None;Cane - single point      Prior Function Level of Independence: Independent         Comments: occasionally uses cane     Hand Dominance        Extremity/Trunk Assessment   Upper Extremity Assessment: Defer to OT evaluation           Lower Extremity Assessment: Generalized weakness      Cervical / Trunk Assessment: Normal  Communication   Communication: No difficulties  Cognition Arousal/Alertness: Awake/alert Behavior During Therapy: WFL for tasks assessed/performed Overall Cognitive Status: Within Functional  Limits for tasks assessed                      General Comments General comments (skin integrity, edema, etc.): Per NT, 2 days ago pt had walked length of hallway with nursing and then yesterday was lethargic and unable to ambulate.    Exercises        Assessment/Plan    PT Assessment Patient needs continued PT services  PT Diagnosis Generalized weakness;Difficulty walking   PT Problem List Decreased activity tolerance;Decreased balance;Decreased mobility  PT Treatment Interventions Gait training;Functional mobility training;Therapeutic exercise;Therapeutic activities;DME instruction;Balance training   PT Goals (Current goals can be found in the Care Plan section) Acute Rehab PT  Goals Patient Stated Goal: not be sick and no pain PT Goal Formulation: With patient Time For Goal Achievement: 08/06/14 Potential to Achieve Goals: Good    Frequency Min 3X/week   Barriers to discharge        Co-evaluation               End of Session Equipment Utilized During Treatment: Gait belt Activity Tolerance: Treatment limited secondary to medical complications (Comment) (syncope) Patient left: in chair;with call bell/phone within reach;with family/visitor present;with nursing/sitter in room Nurse Communication: Mobility status         Time: 4917-9150 PT Time Calculation (min) (ACUTE ONLY): 33 min   Charges:   PT Evaluation $Initial PT Evaluation Tier I: 1 Procedure PT Treatments $Gait Training: 8-22 mins   PT G Codes:        Taquita Demby LUBECK 07/23/2014, 12:22 PM

## 2014-07-24 DIAGNOSIS — E876 Hypokalemia: Secondary | ICD-10-CM

## 2014-07-24 LAB — GLUCOSE, CAPILLARY
GLUCOSE-CAPILLARY: 108 mg/dL — AB (ref 70–99)
GLUCOSE-CAPILLARY: 83 mg/dL (ref 70–99)
Glucose-Capillary: 77 mg/dL (ref 70–99)
Glucose-Capillary: 115 mg/dL — ABNORMAL HIGH (ref 70–99)

## 2014-07-24 LAB — BASIC METABOLIC PANEL
Anion gap: 11 (ref 5–15)
BUN: 17 mg/dL (ref 6–23)
CALCIUM: 9.7 mg/dL (ref 8.4–10.5)
CHLORIDE: 100 mmol/L (ref 96–112)
CO2: 30 mmol/L (ref 19–32)
Creatinine, Ser: 0.81 mg/dL (ref 0.50–1.10)
GFR calc Af Amer: 89 mL/min — ABNORMAL LOW (ref >90)
GFR, EST NON AFRICAN AMERICAN: 77 mL/min — AB (ref >90)
Glucose, Bld: 111 mg/dL — ABNORMAL HIGH (ref 70–99)
POTASSIUM: 3.3 mmol/L — AB (ref 3.5–5.1)
Sodium: 141 mmol/L (ref 135–145)

## 2014-07-24 LAB — HEMOGLOBIN A1C
Hgb A1c MFr Bld: 6.3 % — ABNORMAL HIGH (ref 4.8–5.6)
Mean Plasma Glucose: 134 mg/dL

## 2014-07-24 MED ORDER — POTASSIUM CHLORIDE 10 MEQ/100ML IV SOLN
10.0000 meq | INTRAVENOUS | Status: AC
  Administered 2014-07-24 (×6): 10 meq via INTRAVENOUS
  Filled 2014-07-24 (×6): qty 100

## 2014-07-24 MED ORDER — METOCLOPRAMIDE HCL 5 MG/ML IJ SOLN
5.0000 mg | Freq: Three times a day (TID) | INTRAMUSCULAR | Status: AC
  Administered 2014-07-24 – 2014-07-25 (×6): 5 mg via INTRAVENOUS
  Filled 2014-07-24 (×2): qty 2
  Filled 2014-07-24 (×2): qty 1
  Filled 2014-07-24 (×2): qty 2

## 2014-07-24 NOTE — Progress Notes (Signed)
4 Days Post-Op  Subjective: Complains of nausea and wants to vomit. CBG is 77. No taking much po because of nausea  Objective: Vital signs in last 24 hours: Temp:  [98.1 F (36.7 C)-98.4 F (36.9 C)] 98.3 F (36.8 C) (02/06 0532) Pulse Rate:  [97-98] 98 (02/06 0532) Resp:  [18] 18 (02/06 0532) BP: (125-148)/(65-82) 148/82 mmHg (02/06 0532) SpO2:  [94 %-98 %] 98 % (02/06 0532) Last BM Date: 07/20/14  Intake/Output from previous day: 02/05 0701 - 02/06 0700 In: 1040 [P.O.:600; I.V.:240; IV Piggyback:200] Out: 1350 [Urine:700; Stool:650] Intake/Output this shift:    Resp: clear to auscultation bilaterally Cardio: regular rate and rhythm GI: soft, distended. few bs  Lab Results:   Recent Labs  07/22/14 0750  WBC 8.3  HGB 13.3  HCT 39.9  PLT 263   BMET  Recent Labs  07/23/14 0454 07/24/14 0526  NA 139 141  K 3.8 3.3*  CL 99 100  CO2 30 30  GLUCOSE 112* 111*  BUN 13 17  CREATININE 0.78 0.81  CALCIUM 9.4 9.7   PT/INR No results for input(s): LABPROT, INR in the last 72 hours. ABG No results for input(s): PHART, HCO3 in the last 72 hours.  Invalid input(s): PCO2, PO2  Studies/Results: Dg Chest 2 View  07/22/2014   CLINICAL DATA:  Status post laparoscopic repair of an incarcerated ventral and umbilical hernia with best insertion; complaining of shortness of breath and weakness, history of diabetes and asthma, long-term smoker  EXAM: CHEST  2 VIEW  COMPARISON:  PA and lateral chest x-ray of July 15, 2014  FINDINGS: The lungs are reasonably well inflated. The cardiopericardial silhouette is enlarged. The pulmonary vascularity is engorged. There are increased interstitial densities in both lung bases. Is no alveolar infiltrate. There is no significant pleural effusion.  IMPRESSION: COPD with acute CHF and mild pulmonary interstitial edema. There is bibasilar atelectasis and scarring.   Electronically Signed   By: David  Martinique   On: 07/22/2014 10:48   Dg Abd 2  Views  07/22/2014   CLINICAL DATA:  Mid and right lower abdominal pain 1 day status post repair of an incarcerated hernia, nausea and vomiting  EXAM: ABDOMEN - 2 VIEW  COMPARISON:  CT scan of the abdomen and pelvis of June 02, 2014.  FINDINGS: There is mild gaseous distention of the stomach. There are distended gas-filled small bowel loops in the left upper quadrant. The. decubitus film reveals numerous air-fluid levels within small bowel loops. There is gas in the right colon. There is only a small amount of gas in the rectum.  IMPRESSION: The bowel gas pattern is consistent with small bowel ileus. No markedly distended bowel loops are demonstrated. Nasogastric suction may be useful.   Electronically Signed   By: David  [place]   On: 07/22/2014 12:25    Anti-infectives: Anti-infectives    Start     Dose/Rate Route Frequency Ordered Stop   07/20/14 1800  ceFAZolin (ANCEF) IVPB 2 g/50 mL premix     2 g100 mL/hr over 30 Minutes Intravenous Every 8 hours 07/20/14 1630 07/21/14 1111   07/20/14 1645  azithromycin (ZITHROMAX) tablet 250 mg  Status:  Discontinued     250 mg Oral Daily 07/20/14 1630 07/20/14 1638   07/20/14 0826  ceFAZolin (ANCEF) IVPB 2 g/50 mL premix     2 g100 mL/hr over 30 Minutes Intravenous On call to O.R. 07/20/14 0826 07/20/14 1003      Assessment/Plan: s/p Procedure(s): LAPAROSCOPIC REPAIR INCARCARATED  VENTRAL HERNIA AND UMBILICAL HERNIA (N/A) INSERTION OF MESH (N/A) will not advance diet until ileus resolves  Add reglan for ileus Hold diabetes meds except sliding scale until ileus resolves  LOS: 4 days    TOTH III,PAUL S 07/24/2014

## 2014-07-24 NOTE — Progress Notes (Signed)
Patient ID: Sarah [name], female   DOB: March 05, 1956, 59 y.o.   MRN: [id number] [facility] PROGRESS NOTE  Sarah [name] [id number]:[id number] DOB: 1955-10-24 DOA: 07/20/2014 PCP: Angelica Chessman, MD  Brief narrative:    59 y.o. 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 Surgery noted that patient has incarcerated ventral hernia above the umbilicus containing only fat and it was felt at that time that the patient's pain is because of pancreatitis. She was advised to follow-up with surgery outpatient but she was lost to follow-up. Patient presented to [facility] long for evaluation of ongoing abdominal pain and she wanted to proceed with surgery. Patient is status post laparoscopic repair of incarcerated ventral hernia and umbilical hernia with insertion of mesh, done 07/20/2014. Her hospital course is somewhat complicated with hypoxia but as noted patient has history of COPD and is an active smoker which could contribute to her hypoxia. [facility] was consulted for management of hypoxia. Chest x-ray was done 07/22/2014 and showed COPD and CHF. 2-D echo showed preserved ejection fraction with grade 1 diastolic dysfunction. Postoperative course complicated with ileus.   Assessment/Plan:     Principal Problem:  Abdominal wall hernia / postoperative ileus - Status post laparoscopic repair of incarcerated ventral hernia and umbilical hernia with placement of mesh, done 07/20/2014. Developed postoperative ileus. - Management per surgery.  Active Problems:  Acute respiratory failure with hypoxia / COPD - Respiratory status is stable.  - Chest x-ray showed CHF and COPD. 2-D echo showed preserved ejection fraction with grade 1 diastolic dysfunction so hypoxia likely due to COPD. - Continue Pulmicort and Brovana nebulizer  treatment twice daily in hospital. Continue Spiriva inhaler daily.    Hypokalemia - from GI losses - supplemented  Diabetes mellitus, no mention is controlled or if complicated - V5I is 6.3 on this admission indicating good glycemic control. - diabetic meds on hold due to ileus, only on SSI - continue to hold actos - diabetic coordinator consulted   Essential hypertension - Continue Norvasc 10 mg daily, HCTZ 50 mg daily, lisinopril 5 mg daily.   DVT Prophylaxis  - Lovenox subQ ordered   Code Status: Full.  Family Communication: family not at the bedside this am Disposition Plan: Home when stable.   IV access:  Peripheral IV  Procedures and diagnostic studies:   Dg Chest 2 View 07/22/2014 COPD with acute CHF and mild pulmonary interstitial edema. There is bibasilar atelectasis and scarring.   Dg Abd 2 Views 07/22/2014 The bowel gas pattern is consistent with small bowel ileus. No markedly distended bowel loops are demonstrated. Nasogastric suction may be useful.   Medical Consultants:  Surgery primary [facility] consultant  Other Consultants:  None   IAnti-Infectives:   None       Leisa Lenz, MD  [facility] Pager [phone]  If 7PM-7AM, please contact night-coverage [email or web address] Password [email or web address] 07/24/2014, 3:56 PM   LOS: 4 days    HPI/Subjective: No acute overnight events.  Objective: Filed Vitals:   07/24/14 0532 07/24/14 0911 07/24/14 1018 07/24/14 1553  BP: 148/82  148/68 129/85  Pulse: 98   87  Temp: 98.3 F (36.8 C)   98.8 F (37.1 C)  TempSrc: Oral   Oral  Resp: 18   18  Height:      Weight:  SpO2: 98% 100%  97%    Intake/Output Summary (Last 24 hours) at 07/24/14 1556 Last data filed at 07/24/14 1543  Gross per 24 hour  Intake    640 ml  Output   1750 ml  Net  -1110 ml    Exam:   General:  Pt is alert, follows commands appropriately, not in acute distress  Cardiovascular: Regular rate and rhythm,  S1/S2, no murmurs  Respiratory: no wheezing, no crackles, no rhonchi  Abdomen: softly distended, bowel sounds present  Extremities: No edema, pulses DP and PT palpable bilaterally   Data Reviewed: Basic Metabolic Panel:  Recent Labs Lab 07/22/14 0750 07/23/14 0454 07/24/14 0526  NA 139 139 141  K 2.9* 3.8 3.3*  CL 98 99 100  CO2 31 30 30   GLUCOSE 136* 112* 111*  BUN 10 13 17   CREATININE 0.80 0.78 0.81  CALCIUM 9.8 9.4 9.7   Liver Function Tests: No results for input(s): AST, ALT, ALKPHOS, BILITOT, PROT, ALBUMIN in the last 168 hours. No results for input(s): LIPASE, AMYLASE in the last 168 hours. No results for input(s): AMMONIA in the last 168 hours. CBC:  Recent Labs Lab 07/22/14 0750  WBC 8.3  HGB 13.3  HCT 39.9  MCV 81.3  PLT 263   Cardiac Enzymes:  Recent Labs Lab 07/22/14 1210 07/22/14 1701 07/22/14 2330  TROPONINI <0.03 <0.03 <0.03   BNP: Invalid input(s): POCBNP CBG:  Recent Labs Lab 07/23/14 1108 07/23/14 1615 07/23/14 2209 07/24/14 0840 07/24/14 1228  GLUCAP 138* 100* 91 77 108*    No results found for this or any previous visit (from the past 240 hour(s)).   Scheduled Meds: . allopurinol  100 mg Oral Daily  . amLODipine  10 mg Oral Daily  . arformoterol  15 mcg Nebulization BID  . budesonide (PULMICORT) nebulizer solution  0.25 mg Nebulization BID  . enoxaparin (LOVENOX) injection  40 mg Subcutaneous Q24H  . fluticasone  1 spray Each Nare Daily  . hydrochlorothiazide  50 mg Oral Daily  . insulin aspart  0-20 Units Subcutaneous TID WC  . lisinopril  5 mg Oral q morning - 10a  . meloxicam  7.5 mg Oral Daily  . metoCLOPramide (REGLAN) injection  5 mg Intravenous 3 times per day  . nicotine  21 mg Transdermal Daily  . pantoprazole  20 mg Oral QHS  . potassium chloride  10 mEq Intravenous Q1 Hr x 6  . tiotropium  18 mcg Inhalation Daily   Continuous Infusions: . 0.9 % NaCl with KCl 20 mEq / L 50 mL/hr at 07/24/14 1132

## 2014-07-25 DIAGNOSIS — K9189 Other postprocedural complications and disorders of digestive system: Secondary | ICD-10-CM

## 2014-07-25 DIAGNOSIS — K567 Ileus, unspecified: Secondary | ICD-10-CM | POA: Insufficient documentation

## 2014-07-25 DIAGNOSIS — K460 Unspecified abdominal hernia with obstruction, without gangrene: Secondary | ICD-10-CM

## 2014-07-25 LAB — BASIC METABOLIC PANEL
ANION GAP: 8 (ref 5–15)
BUN: 15 mg/dL (ref 6–23)
CO2: 29 mmol/L (ref 19–32)
Calcium: 9.2 mg/dL (ref 8.4–10.5)
Chloride: 101 mmol/L (ref 96–112)
Creatinine, Ser: 0.65 mg/dL (ref 0.50–1.10)
GFR calc Af Amer: >90 mL/min (ref >90)
Glucose, Bld: 90 mg/dL (ref 70–99)
Potassium: 3.4 mmol/L — ABNORMAL LOW (ref 3.5–5.1)
SODIUM: 138 mmol/L (ref 135–145)

## 2014-07-25 LAB — GLUCOSE, CAPILLARY
GLUCOSE-CAPILLARY: 87 mg/dL (ref 70–99)
GLUCOSE-CAPILLARY: 117 mg/dL — AB (ref 70–99)
Glucose-Capillary: 87 mg/dL (ref 70–99)
Glucose-Capillary: 100 mg/dL — ABNORMAL HIGH (ref 70–99)

## 2014-07-25 LAB — CBC
HCT: 35.2 % — ABNORMAL LOW (ref 36.0–46.0)
Hemoglobin: 11.5 g/dL — ABNORMAL LOW (ref 12.0–15.0)
MCH: 27 pg (ref 26.0–34.0)
MCHC: 32.7 g/dL (ref 30.0–36.0)
MCV: 82.6 fL (ref 78.0–100.0)
Platelets: 247 10*3/uL (ref 150–400)
RBC: 4.26 MIL/uL (ref 3.87–5.11)
RDW: 15.5 % (ref 11.5–15.5)
WBC: 6.2 10*3/uL (ref 4.0–10.5)

## 2014-07-25 MED ORDER — BUDESONIDE-FORMOTEROL FUMARATE 160-4.5 MCG/ACT IN AERO
2.0000 | INHALATION_SPRAY | Freq: Two times a day (BID) | RESPIRATORY_TRACT | Status: DC
Start: 2014-07-25 — End: 2014-07-27
  Administered 2014-07-25 – 2014-07-27 (×4): 2 via RESPIRATORY_TRACT
  Filled 2014-07-25: qty 6

## 2014-07-25 MED ORDER — IPRATROPIUM-ALBUTEROL 0.5-2.5 (3) MG/3ML IN SOLN: 3.0000 mL | RESPIRATORY_TRACT | Status: DC | PRN

## 2014-07-25 MED ORDER — POTASSIUM CHLORIDE 10 MEQ/100ML IV SOLN
10.0000 meq | INTRAVENOUS | Status: AC
  Administered 2014-07-25 (×4): 10 meq via INTRAVENOUS
  Filled 2014-07-25 (×4): qty 100

## 2014-07-25 MED ORDER — POTASSIUM CHLORIDE CRYS ER 20 MEQ PO TBCR
40.0000 meq | EXTENDED_RELEASE_TABLET | Freq: Two times a day (BID) | ORAL | Status: AC
  Administered 2014-07-25 – 2014-07-26 (×3): 40 meq via ORAL
  Filled 2014-07-25 (×3): qty 2

## 2014-07-25 MED ORDER — POTASSIUM CHLORIDE 10 MEQ/100ML IV SOLN
10.0000 meq | INTRAVENOUS | Status: DC
  Filled 2014-07-25 (×4): qty 100

## 2014-07-25 NOTE — Progress Notes (Signed)
Patient ID: Sarah [name], female   DOB: Aug 05, 1955, 59 y.o.   MRN: [id number] [facility] PROGRESS NOTE  Sarah [name] HUT:[id number] DOB: 1955-12-06 DOA: 07/20/2014 PCP: Angelica Chessman, MD  Brief narrative:    59 y.o. 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 Surgery noted that patient has incarcerated ventral hernia above the umbilicus containing only fat and it was felt at that time that the patient's pain is because of pancreatitis. She was advised to follow-up with surgery outpatient but she was lost to follow-up. Patient presented to [facility] long for evaluation of ongoing abdominal pain and she wanted to proceed with surgery. Patient is status post laparoscopic repair of incarcerated ventral hernia and umbilical hernia with insertion of mesh, done 07/20/2014. Her hospital course is somewhat complicated with hypoxia but as noted patient has history of COPD and is an active smoker which could contribute to her hypoxia. [facility] was consulted for management of hypoxia. Chest x-ray was done 07/22/2014 and showed COPD and CHF. 2-D echo showed preserved ejection fraction with grade 1 diastolic dysfunction. Postoperative course complicated with ileus.   Assessment/Plan:    Principal Problem:  Abdominal wall hernia / postoperative ileus - Status post laparoscopic repair of incarcerated ventral hernia and umbilical hernia with placement of mesh, done 07/20/2014. Developed postoperative ileus. Continue management per surgery.   Active Problems:  Acute respiratory failure with hypoxia / COPD - Respiratory status remains stable.  - Chest x-ray showed CHF and COPD. 2-D echo showed preserved ejection fraction with grade 1 diastolic dysfunction so hypoxia likely due to COPD. - we started her on Pulmicort and  Brovana nebulizer treatment twice daily in hospital. Since respiratory status ok with stop those 2 and switch to Symbicort. She can continue Spiriva inhaler daily.  - oxygen saturation 97%.   Hypokalemia - from GI losses - supplemented, given IV potassium runs today as well, 4 doses  Diabetes mellitus, no mention is controlled or if complicated - S5K is 6.3 on this admission indicating good glycemic control. - diabetic meds on hold due to ileus, only on SSI - continue to hold actos (would recommend to hold it on discharge). Other meds ok to continue once ileus resolves  - diabetic coordinator consulted   Essential hypertension - Continue Norvasc 10 mg daily, HCTZ 50 mg daily, lisinopril 5 mg daily.   DVT Prophylaxis  - Lovenox subQ ordered   Code Status: Full.  Family Communication: family at the bedside this am Disposition Plan: per primary; will sign off; please call if we can be of any other help [phone] or cell# [phone]  IV access:  Peripheral IV  Procedures and diagnostic studies:   Dg Chest 2 View 07/22/2014 COPD with acute CHF and mild pulmonary interstitial edema. There is bibasilar atelectasis and scarring.   Dg Abd 2 Views 07/22/2014 The bowel gas pattern is consistent with small bowel ileus. No markedly distended bowel loops are demonstrated. Nasogastric suction may be useful.   Medical Consultants:  Surgery primary [facility] consultant  Other Consultants:  None   IAnti-Infectives:   None     Leisa Lenz, MD  [facility] Pager [phone]  If 7PM-7AM, please contact night-coverage [email or web address] Password [email or web address] 07/25/2014, 12:03 PM   LOS: 5 days    HPI/Subjective: No acute overnight events.  Objective:  Filed Vitals:   07/24/14 2126 07/25/14 0619 07/25/14 0742 07/25/14 1036  BP: 119/62 122/59  115/56  Pulse: 91 80    Temp: 98 F (36.7 C) 97.7 F (36.5 C)    TempSrc: Oral Oral    Resp: 18 15    Height:       Weight:      SpO2: 90% 93% 97%     Intake/Output Summary (Last 24 hours) at 07/25/14 1203 Last data filed at 07/25/14 1050  Gross per 24 hour  Intake 1898.67 ml  Output   1250 ml  Net 648.67 ml    Exam:   General:  Pt is anot in acute distress  Cardiovascular: Regular rate and rhythm, S1/S2 (+)  Respiratory: no wheezing, no rhonchi   Abdomen: Softly distended, bowel sounds present  Extremities: No edema, pulses DP and PT palpable bilaterally  Neuro: Grossly nonfocal, good muscle strength   Data Reviewed: Basic Metabolic Panel:  Recent Labs Lab 07/22/14 0750 07/23/14 0454 07/24/14 0526 07/25/14 0613  NA 139 139 141 138  K 2.9* 3.8 3.3* 3.4*  CL 98 99 100 101  CO2 31 30 30 29   GLUCOSE 136* 112* 111* 90  BUN 10 13 17 15   CREATININE 0.80 0.78 0.81 0.65  CALCIUM 9.8 9.4 9.7 9.2   Liver Function Tests: No results for input(s): AST, ALT, ALKPHOS, BILITOT, PROT, ALBUMIN in the last 168 hours. No results for input(s): LIPASE, AMYLASE in the last 168 hours. No results for input(s): AMMONIA in the last 168 hours. CBC:  Recent Labs Lab 07/22/14 0750 07/25/14 0613  WBC 8.3 6.2  HGB 13.3 11.5*  HCT 39.9 35.2*  MCV 81.3 82.6  PLT 263 247   Cardiac Enzymes:  Recent Labs Lab 07/22/14 1210 07/22/14 1701 07/22/14 2330  TROPONINI <0.03 <0.03 <0.03   BNP: Invalid input(s): POCBNP CBG:  Recent Labs Lab 07/24/14 0840 07/24/14 1228 07/24/14 1709 07/24/14 2155 07/25/14 0801  GLUCAP 77 108* 83 115* 87    No results found for this or any previous visit (from the past 240 hour(s)).   Scheduled Meds: . allopurinol  100 mg Oral Daily  . amLODipine  10 mg Oral Daily  . budesonide-formoterol  2 puff Inhalation BID  . enoxaparin (LOVENOX) injection  40 mg Subcutaneous Q24H  . fluticasone  1 spray Each Nare Daily  . hydrochlorothiazide  50 mg Oral Daily  . insulin aspart  0-20 Units Subcutaneous TID WC  . lisinopril  5 mg Oral q morning - 10a  .  meloxicam  7.5 mg Oral Daily  . metoCLOPramide (REGLAN) injection  5 mg Intravenous 3 times per day  . nicotine  21 mg Transdermal Daily  . pantoprazole  20 mg Oral QHS  . potassium chloride  10 mEq Intravenous Q1 Hr x 4  . tiotropium  18 mcg Inhalation Daily   Continuous Infusions: . 0.9 % NaCl with KCl 20 mEq / L 50 mL/hr at 07/24/14 1608

## 2014-07-25 NOTE — Progress Notes (Signed)
5 Days Post-Op  Subjective: Feels better. Nausea has resolved  Objective: Vital signs in last 24 hours: Temp:  [97.7 F (36.5 C)-98.8 F (37.1 C)] 97.7 F (36.5 C) (02/07 0619) Pulse Rate:  [80-91] 80 (02/07 0619) Resp:  [15-18] 15 (02/07 0619) BP: (119-148)/(59-85) 122/59 mmHg (02/07 0619) SpO2:  [90 %-97 %] 97 % (02/07 0742) Last BM Date: 07/24/14  Intake/Output from previous day: 02/06 0701 - 02/07 0700 In: 1818.7 [P.O.:240; I.V.:978.7; IV Piggyback:600] Out: 1250 [Urine:1250] Intake/Output this shift:    Resp: clear to auscultation bilaterally Cardio: regular rate and rhythm GI: soft, tender laterally. good bs  Lab Results:   Recent Labs  07/25/14 0613  WBC 6.2  HGB 11.5*  HCT 35.2*  PLT 247   BMET  Recent Labs  07/24/14 0526 07/25/14 0613  NA 141 138  K 3.3* 3.4*  CL 100 101  CO2 30 29  GLUCOSE 111* 90  BUN 17 15  CREATININE 0.81 0.65  CALCIUM 9.7 9.2   PT/INR No results for input(s): LABPROT, INR in the last 72 hours. ABG No results for input(s): PHART, HCO3 in the last 72 hours.  Invalid input(s): PCO2, PO2  Studies/Results: No results found.  Anti-infectives: Anti-infectives    Start     Dose/Rate Route Frequency Ordered Stop   07/20/14 1800  ceFAZolin (ANCEF) IVPB 2 g/50 mL premix     2 g100 mL/hr over 30 Minutes Intravenous Every 8 hours 07/20/14 1630 07/21/14 1111   07/20/14 1645  azithromycin (ZITHROMAX) tablet 250 mg  Status:  Discontinued     250 mg Oral Daily 07/20/14 1630 07/20/14 1638   07/20/14 0826  ceFAZolin (ANCEF) IVPB 2 g/50 mL premix     2 g100 mL/hr over 30 Minutes Intravenous On call to O.R. 07/20/14 0826 07/20/14 1003      Assessment/Plan: s/p Procedure(s): LAPAROSCOPIC REPAIR INCARCARATED VENTRAL HERNIA AND UMBILICAL HERNIA (N/A) INSERTION OF MESH (N/A) Advance diet  Continue reglan for ileus ambulate  LOS: 5 days    TOTH III,Lilymae Swiech S 07/25/2014

## 2014-07-25 NOTE — Clinical Social Work Note (Signed)
CSW received a call from supervisor Zach at 5:10 pm stating that pt's husband has been staying in his room for past 5 days and could use a meal voucher.  Supervisor also stated that Media planner had sent an email outlining requests  Had been mad to CSW for help but help was not provided  CSW reviewed pt chart and no consult pt or pt's husband was referred to [place] did not receive any phone calls to assist pt or pt's husband with any meals or any other services over the weekend  CSW reviewed chart which reflected a note from week day CSW detailing discussing possible SNF placement with pt  CSW reviewed other orders for CSW consult but could not find any CSW consult ordered for anything  CSW reviewed weekend handout for this pt's information but no information was provided on handout  In reviewing pt's notes it appears that PT recommended SNF but since no consult and nothing was noted on handout, no paperwork was competed by weekend CSW  CSW provided 2 meal vouchers to pt's RN  Week day CSW will follow up with pt for needs and services  .Dede Query, LCSW [facility] Clinical Social Worker - Weekend Coverage cell #: [phone]

## 2014-07-26 LAB — BASIC METABOLIC PANEL
ANION GAP: 11 (ref 5–15)
BUN: 9 mg/dL (ref 6–23)
CO2: 26 mmol/L (ref 19–32)
CREATININE: 0.69 mg/dL (ref 0.50–1.10)
Calcium: 9.2 mg/dL (ref 8.4–10.5)
Chloride: 97 mmol/L (ref 96–112)
GFR calc Af Amer: >90 mL/min (ref >90)
GFR calc non Af Amer: >90 mL/min (ref >90)
Glucose, Bld: 100 mg/dL — ABNORMAL HIGH (ref 70–99)
Potassium: 3.7 mmol/L (ref 3.5–5.1)
Sodium: 134 mmol/L — ABNORMAL LOW (ref 135–145)

## 2014-07-26 LAB — GLUCOSE, CAPILLARY: Glucose-Capillary: 102 mg/dL — ABNORMAL HIGH (ref 70–99)

## 2014-07-26 MED ORDER — LACTULOSE 10 GM/15ML PO SOLN
30.0000 g | Freq: Two times a day (BID) | ORAL | Status: DC | PRN
  Filled 2014-07-26 (×2): qty 45

## 2014-07-26 MED ORDER — POLYETHYLENE GLYCOL 3350 17 G PO PACK
17.0000 g | PACK | Freq: Two times a day (BID) | ORAL | Status: AC
  Administered 2014-07-26 – 2014-07-27 (×3): 17 g via ORAL
  Filled 2014-07-26 (×3): qty 1

## 2014-07-26 MED ORDER — PROMETHAZINE HCL 25 MG PO TABS: 25.0000 mg | ORAL_TABLET | Freq: Four times a day (QID) | ORAL | Status: DC | PRN

## 2014-07-26 NOTE — Care Management Note (Signed)
    Page 1 of 2   07/26/2014     2:24:57 PM CARE MANAGEMENT NOTE 07/26/2014  Patient:  Sarah [name], Sarah [name]   Account Number:  [id number]  Date Initiated:  07/22/2014  Documentation initiated by:  Sunday Spillers  Subjective/Objective Assessment:   59 yo female admitted s/p ventral hernia repair. PTA lived at home with spouse.     Action/Plan:   Home vs SNF when stable   Anticipated DC Date:  07/26/2014   Anticipated DC Plan:  HOME/SELF CARE  In-house referral  Clinical Social Worker      DC Forensic scientist  CM consult      PAC Choice  [place]   Choice offered to / List presented to:  C-4 Adult Children   DME arranged  [place]      DME agency  [place] arranged  [place].   Status of service:  Completed, signed off Medicare Important Message given?  YES (If response is "NO", the following Medicare IM given date fields will be blank) Date Medicare IM given:  07/26/2014 Medicare IM given by:  [facility] Date Additional Medicare IM given:   Additional Medicare IM given by:    Discharge Disposition:  [place]  Per UR Regulation:  Reviewed for med. necessity/level of care/duration of stay  If discussed at [place] of Stay Meetings, dates discussed:    Comments:  07-26-14 [place] 1420 Spoke with patient at bedside regarding [place] needs, patient declines SNF at this time and feels she can manage at home. Deferred HH choice to daughter after reviewing the [facility] list. Provided my number and she had her daughter call me. Daughter states they are currrently arranging for someone to stay with patient and her husband 24/7 for the next several weeks. She is agreeable to skilled services as well. She had no preference of HH ageny, contacted [facility] and they will provide services. Patient also requesting RW with seat. Message to  attending for orders for PT/OT/CSW/ RW, awaiting final orders.

## 2014-07-26 NOTE — Progress Notes (Signed)
6 Days Post-Op  Subjective: Tolerating full liquid diet. Wants regular food. Lots of flatus but no stool over weekend. Minimal abdominal pain.  She now states that she does not want to go to a nursing home and would prefer to be discharged home. She states that she can take care of herself and her husband better this way.  Objective: Vital signs in last 24 hours: Temp:  [97.7 F (36.5 C)-98.5 F (36.9 C)] 98.5 F (36.9 C) (02/08 0536) Pulse Rate:  [80-105] 81 (02/08 0536) Resp:  [15-18] 18 (02/08 0536) BP: (115-130)/(54-65) 125/65 mmHg (02/08 0536) SpO2:  [92 %-97 %] 93 % (02/08 0536) Last BM Date: 07/23/14  Intake/Output from previous day: 02/07 0701 - 02/08 0700 In: 1200 [P.O.:200; I.V.:1000] Out: 2850 [Urine:2850] Intake/Output this shift: Total I/O In: 400 [I.V.:400] Out: 1550 [Urine:1550]    EXAM: General appearance: Alert. Cooperative. No distress. Lungs clear to auscultation bilaterally Abdomen soft. Not distended. Essentially nontender. Wounds look fine.    Lab Results:  Results for orders placed or performed during the hospital encounter of 07/20/14 (from the past 24 hour(s))  CBC     Status: Abnormal   Collection Time: 07/25/14  6:13 AM  Result Value Ref Range   WBC 6.2 4.0 - 10.5 K/uL   RBC 4.26 3.87 - 5.11 MIL/uL   Hemoglobin 11.5 (L) 12.0 - 15.0 g/dL   HCT 35.2 (L) 36.0 - 46.0 %   MCV 82.6 78.0 - 100.0 fL   MCH 27.0 26.0 - 34.0 pg   MCHC 32.7 30.0 - 36.0 g/dL   RDW 15.5 11.5 - 15.5 %   Platelets 247 150 - 400 K/uL  Basic metabolic panel     Status: Abnormal   Collection Time: 07/25/14  6:13 AM  Result Value Ref Range   Sodium 138 135 - 145 mmol/L   Potassium 3.4 (L) 3.5 - 5.1 mmol/L   Chloride 101 96 - 112 mmol/L   CO2 29 19 - 32 mmol/L   Glucose, Bld 90 70 - 99 mg/dL   BUN 15 6 - 23 mg/dL   Creatinine, Ser 0.65 0.50 - 1.10 mg/dL   Calcium 9.2 8.4 - 10.5 mg/dL   GFR calc non Af Amer >90 >90 mL/min   GFR calc Af Amer >90 >90 mL/min   Anion  gap 8 5 - 15  Glucose, capillary     Status: None   Collection Time: 07/25/14  8:01 AM  Result Value Ref Range   Glucose-Capillary 87 70 - 99 mg/dL  Glucose, capillary     Status: None   Collection Time: 07/25/14 12:12 PM  Result Value Ref Range   Glucose-Capillary 87 70 - 99 mg/dL  Glucose, capillary     Status: Abnormal   Collection Time: 07/25/14  4:57 PM  Result Value Ref Range   Glucose-Capillary 117 (H) 70 - 99 mg/dL  Glucose, capillary     Status: Abnormal   Collection Time: 07/25/14  9:38 PM  Result Value Ref Range   Glucose-Capillary 100 (H) 70 - 99 mg/dL   Comment 1 Notify RN      Studies/Results: No results found.  [name] allopurinol  100 mg Oral Daily  . amLODipine  10 mg Oral Daily  . budesonide-formoterol  2 puff Inhalation BID  . enoxaparin (LOVENOX) injection  40 mg Subcutaneous Q24H  . fluticasone  1 spray Each Nare Daily  . insulin aspart  0-20 Units Subcutaneous TID WC  . lisinopril  5 mg Oral q  morning - 10a  . meloxicam  7.5 mg Oral Daily  . nicotine  21 mg Transdermal Daily  . pantoprazole  20 mg Oral QHS  . polyethylene glycol  17 g Oral BID  . potassium chloride  40 mEq Oral BID  . tiotropium  18 mcg Inhalation Daily     Assessment/Plan: s/p Procedure(s): LAPAROSCOPIC REPAIR INCARCARATED VENTRAL HERNIA AND UMBILICAL HERNIA INSERTION OF MESH  POD #6. Laparoscopic repair of incarcerated ventral hernia and umbilical hernia with mesh. Stable surgically, ." Ileus has essentially resolved. Advance to core modified diet this morning Offer Mira lax one more time If tolerates diet and does well with physical therapy today, discharge home tomorrow.  Multiple medical problems. Stable. [facility] hospitalists have signed off.. Greatly appreciate their help. Has been followed intermittantly at [facility] as outpatient.  Borderline hypoxia. 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 improved and off oxygen now.  Diabetes mellitus. On Amaryl, Actos,  metformin. On sliding scale insulin. on Lantus 10 units daily. Good control.  Hypertension. 152/77. Seems controlled. Continue present lisinopril, HCTZ, and Norvasc.  Hypokalemia. Received IV and by mouth supplementation yesterday. Check electrolytes this morning.  Anxiety and former alcohol use. Reportedly quit 10 years ago.  Hospitalized for acute pancreatitis, July 2014.  Continue PT and OT.    @PROBHOSP @  LOS: 6 days    Janese Radabaugh M 07/26/2014  . .prob

## 2014-07-26 NOTE — Progress Notes (Signed)
Physical Therapy Treatment Patient Details Name: Sarah [name] MRN: [id number] DOB: Jul 25, 1955 Today's Date: 07/26/2014    History of Present Illness s/p hernia repair    PT Comments    Pt progressing well.  Pt was able to get self out of recliner and amb full unit using RW just for safety/increased stability.   Pt would prefer to D/C to home vs SNF. Pt would like a bariatric cruiser walker with seat if possible.  If not will need a bariatric RW.  Follow Up Recommendations  Home health PT  will consult LPT on pt's progress   Equipment Recommendations  Other (comment) (4 [employer] cruiser with seat  (bariatric))    Recommendations for Other Services       Precautions / Restrictions Precautions Precautions: Fall Precaution Comments: ABD binder Restrictions Weight Bearing Restrictions: No    Mobility  Bed Mobility Overal bed mobility: Needs Assistance       Supine to sit: Min assist Sit to supine: Min assist   General bed mobility comments: Pt up in chair upon arrival  Transfers Overall transfer level: Needs assistance Equipment used: None;1 person hand held assist Transfers: Sit to/from Stand Sit to Stand: Supervision         General transfer comment: good safety cognition   Ambulation/Gait Ambulation/Gait assistance: Supervision Ambulation Distance (Feet): 350 Feet Assistive device: Rolling walker (2 wheeled) Gait Pattern/deviations: Step-through pattern;Decreased stride length;Trunk flexed Gait velocity: decreased but functional   General Gait Details: amb with RW just for safety/increased stability.  pt tolerated amb full unit.  Good alternating gait and NO LOB.     Stairs Stairs:  (no stairs to enter home)          Wheelchair Mobility    Modified Rankin (Stroke Patients Only)       Balance                                    Cognition Arousal/Alertness: Awake/alert Behavior During Therapy: WFL for tasks  assessed/performed Overall Cognitive Status: Within Functional Limits for tasks assessed                      Exercises      General Comments        Pertinent Vitals/Pain Pain Score: 4  Pain Location: belly Pain Descriptors / Indicators: Sore    Home Living                      Prior Function            PT Goals (current goals can now be found in the care plan section) Progress towards PT goals: Progressing toward goals    Frequency  Min 3X/week    PT Plan      Co-evaluation             End of Session Equipment Utilized During Treatment: Gait belt Activity Tolerance: Patient tolerated treatment well Patient left: in chair;with call bell/phone within reach;with family/visitor present     Time: 1157-1222 PT Time Calculation (min) (ACUTE ONLY): 25 min  Charges:  $Gait Training: 8-22 mins $Therapeutic Activity: 8-22 mins                    G Codes:      Sarah [name]  PTA [facility]  Acute  Rehab Pager      [phone]

## 2014-07-26 NOTE — Progress Notes (Signed)
Occupational Therapy Treatment Patient Details Name: Sarah [name] MRN: [id number] DOB: 14-Apr-1956 Today's Date: 07/26/2014    History of present illness s/p hernia repair      Follow Up Recommendations  Home health OT;Supervision/Assistance - 24 hour;SNF;Other (comment) (depending on progress and family to A at home)    Equipment Recommendations  Tub/shower seat;None recommended by OT    Recommendations for Other Services      Precautions / Restrictions Precautions Precautions: Fall       Mobility Bed Mobility Overal bed mobility: Needs Assistance       Supine to sit: Min assist Sit to supine: Min assist      Transfers Overall transfer level: Needs assistance Equipment used: 1 person hand held assist Transfers: Sit to/from Stand Sit to Stand: Supervision              Balance                                   ADL       Grooming: Wash/dry Materials engineer Transfer: Min guard   Toileting- Water quality scientist and Hygiene: Min guard;Sit to/from stand       Functional mobility during ADLs: Minimal assistance;Cueing for sequencing;Cueing for safety General ADL Comments: discussed in details SNF or HH. Pt wants HH but OT feels pt will benefit from SNF.  Pt worried about husband. HH will work if daugthers can provide some A at home      Vision                     Perception     Praxis      Cognition   Behavior During Therapy: [facility] for tasks assessed/performed Overall Cognitive Status: Within Functional Limits for tasks assessed                       Extremity/Trunk Assessment               Exercises     Shoulder Instructions       General Comments      Pertinent Vitals/ Pain       Pain Score: 4  Pain Location: belly Pain Descriptors / Indicators: Sore  Home Living     pt cares for husband with dementia                                       Prior  Functioning/Environment              Frequency       Progress Toward Goals  OT Goals(current goals can now be found in the care plan section)  Progress towards OT goals: Progressing toward goals     Plan Discharge plan needs to be updated    Co-evaluation                 End of Session     Activity Tolerance Patient tolerated treatment well   Patient Left in chair;with family/visitor present   Nurse Communication Mobility status        Time: 8546-2703 OT Time Calculation (min): 32 min  Charges: OT General Charges $OT Visit: 1 Procedure OT Treatments $Self Care/Home Management :  23-37 mins  Lequita Meadowcroft, Thereasa Parkin 07/26/2014, 11:54 AM

## 2014-07-26 NOTE — Progress Notes (Signed)
Clinical Social Work  CSW received a call from Scientist, research (physical sciences)) who reports that there has been concern re: patient's husband staying at the hospital. CSW spoke with bedside RN and CM prior to meeting with patient. Patient came to the hospital for surgery and husband has been staying with patient because she is his only caregiver.  CSW met with patient at bedside. Patient alert and oriented and engaged in assessment. Patient's husband was in room but sat at the desk in the room and completed word search puzzles. Patient reports that she and husband have been married for over 60 years but she is primary caregiver for husband. Patient reports that husband has cognitive delays and possible dementia. Patient reports that she brought husband with her because she was concerned about him staying alone. Patient and husband have children together but she reports that all of her children work and are unable to assist her. Patient reports she does not feel safe with patient returning home without her because he needs 24/7 supervisor. Patient reports that husband wanders at times and if he were at home all day by himself then he might leave the house and no family available to watch him. Patient reports that she could ask her grandchildren (who are in high school) to provide medication management but they have to go to school during the day. Patient reports she had considered going to SNF but could only go if husband could come with her and they could provide care for him as well. CSW explained that would not be an option and that husband could not stay at SNF with her. CSW explained it would not be an option for husband to stay with her 24/7 at [facility] but patient does not feel she could find any other assistance.  Patient reports she has a ramp at home and feels that if she can get a rolling walker at home with a seat then she will be safe and can ensure husband's safety as well. CSW made CM aware of patient's  desire to return home with [facility] and RW. CM aware of plans and will arrange [facility] to follow as well.  CSW spoke with patient about her feelings re: caring for patient and support at home. Patient reports it has become increasingly difficult to manage husband and children expect her to watch grandchildren as well. Patient reports she loves her husband and knows that God put them together so that she could ensure he was being properly cared for. Patient wants to continue caring for husband and reports she is motivated to ensure she and husband get the care they need. Patient reports limited support since they live in [facility] but would be agreeable to any additional help. CSW spoke with AD regarding situation. At this time, there are no immediate safety concerns since patient is providing care for husband and patient plans to DC home tomorrow with patient. CSW contacted [place] (DSS) and made referral to Ms. Wiggins for in-home care for patient. DSS reports they will place patient on waiting list for in-home aide and adult day care. DSS will contact patient and husband directly with any further questions or once patient has been approved from waiting list. CSW made charge RN aware of plans. AD reports meal vouchers can be provided to husband. If patient is not DC tomorrow then CSW can continue to assist to try and assist with safe planning for husband but at this time plan remains for husband  to stay at hospital and DC with patient tomorrow.  CSW will continue to follow to assist as needed.  Sindy Messing, LCSW (Coverage for [employer])

## 2014-07-27 LAB — GLUCOSE, CAPILLARY
Glucose-Capillary: 124 mg/dL — ABNORMAL HIGH (ref 70–99)
Glucose-Capillary: 110 mg/dL — ABNORMAL HIGH (ref 70–99)
Glucose-Capillary: 118 mg/dL — ABNORMAL HIGH (ref 70–99)
Glucose-Capillary: 110 mg/dL — ABNORMAL HIGH (ref 70–99)

## 2014-07-27 LAB — CREATININE, SERUM
CREATININE: 0.71 mg/dL (ref 0.50–1.10)
GFR calc Af Amer: >90 mL/min (ref >90)

## 2014-07-27 MED ORDER — LACTULOSE 10 GM/15ML PO SOLN
30.0000 g | Freq: Once | ORAL | Status: DC
  Filled 2014-07-27: qty 45

## 2014-07-27 MED ORDER — HYDROCODONE-ACETAMINOPHEN 5-325 MG PO TABS: 1.0000 | ORAL_TABLET | ORAL | Status: DC | PRN

## 2014-07-27 NOTE — Discharge Summary (Addendum)
Patient ID: Sarah [name] [id number] 59 y.o. [birth year]  Admit date: 07/20/2014  Discharge date and time: 07/27/2013  Admitting Physician: [name]  Discharge Physician: [name]  Admission Diagnoses: incarcerated ventral hernia  Discharge Diagnoses: Incarcerated ventral hernia Umbilical hernia Tobacco abuse COPD Postop hypoxia, resolved Diabetes mellitus type 2, controlled Hypertension. Controlled. Postop hypokalemia, resolved Anxiety Former alcohol abuse History acute panic pancreatitis, July 2014   Operations: Procedure(s): LAPAROSCOPIC REPAIR INCARCARATED VENTRAL HERNIA AND UMBILICAL HERNIA INSERTION OF MESH  Admission Condition: fair  Discharged Condition: fair  Indication for Admission: This is a 59 year old [place] American female, apparently referred by Dr. Artis Delay in the [facility] long ED for evaluation of a painful ventral hernia. States that she normally sees the PCP at [facility].  The patient has been known to have an incarcerated ventral hernia in the midline above the umbilicus for at least 18 months. She was hospitalized in July 2014 for acute pancreatitis which resolved after 3 days. Dr. Ninfa Linden saw her in the hospital and noted the incarcerated ventral hernia above the umbilicus containing fat only. It was felt that her pain at that time was due to the pancreatitis. She was advised that she could be followed up in our office electively. She has not been seen by [place]  since that time. She went to the emergency department on June 03, 2014 with nausea and abdominal pain. She was not vomiting. Admitted it had been going on for 3 weeks. She continued to have bowel movements. Was noted to be tender above the umbilicus. Gallbladder ultrasound was normal. CT scan shows a ventral hernia containing fat above the umbilicus. Defect is probably 3 cm., but the hernia sac is 5 cm or greater. Some edema present. No bowel involvement. Stable  bilateral adrenal nodules. Nodular densities of the lung base follow-up in 1 year recommended. The patient's medical care is delivered at [facility].  Comorbidities include obesity, ongoing tobacco abuse, hypertension, asthma, borderline diabetes. Former to alcohol abuse, she states she quit 10 years ago. She states she is that she is having a lot of pain and wants to go ahead with scheduling of surgery. She excepts her risks. I told her that she can lower her risk of recurrence and infection if she would lose weight and stopped smoking for 2 months. She states that she is unable to do this.   Hospital Course: On the day of mesh and the patient was taken to the operating room and underwent laparoscopic repair of incarcerated epigastric hernia which contained omentum. A reducible umbilical hernia was also noted. A single large sheet of mesh was used to cover both of these areas. Surgery was uneventful.     Postoperatively she had lots of problems with pain and nausea and vomiting and ileus and hypokalemia. This took several days to resolve.       She had transient problems with hypoxemia and her chest x-ray looked like congestive heart failure, but her echocardiogram showed no evidence of heart failure and it was felt that this was due to COPD, tobacco abuse, and hypoxia. With intensification of her pulmonary treatments she was weaned off of oxygen sent her lungs became clear. [facility]ists were involved in her care transiently.      She slowly recovered and resumed diet and bowel function although it took enemas and laxatives to get her bowels moving. Initially she thought that she would go to a nursing home but she changed her mind and she and her daughter decided  she wanted to go home where she could help take care of her disabled husband. We'll involve physical therapy, occupational therapy, case management, and clinical social work.      I discussed her discharge plan with Sunday Spillers, RN  from case management. We requested home health PT, home health OT, rolling walker with seat. The patient's daughter stated they were arranging for someone to stay with the patient her husband [phone] for the next several weeks. They're also agreeable to skilled services.     On the day of discharge the patient's patient felt well and was ready to go home. She was tolerating a regular diet and passing lots of flatus although hadn't had a bowel movement in a couple of days. Examination revealed that her abdomen was soft and minimally tender not distended although wounds looked good. Her lungs were clear.      I discussed her diet and activities and medications with her. I stressed that she needed to see her PCP at health[facility] or within 7 days to review her medical problems and medication regimen. I told her to return to see me in the office in 3 weeks.  Consults: [facility] hospitalist  Significant Diagnostic Studies: Lab work. Chest x-ray. EKG. Echocardiogram.  Treatments: surgery: Laparoscopic repair of incarcerated ventral hernia and umbilical hernia with mesh. Pulmonary physiotherapy and inhalation therapy.  Disposition: Home  Patient Instructions:    Medication List    STOP taking these medications        oxyCODONE-acetaminophen 5-325 MG per tablet  Commonly known as:  PERCOCET/ROXICET      TAKE these medications        albuterol 108 (90 BASE) MCG/ACT inhaler  Commonly known as:  PROVENTIL HFA;VENTOLIN HFA  Inhale 2 puffs into the lungs every 6 (six) hours as needed for wheezing.     allopurinol 100 MG tablet  Commonly known as:  ZYLOPRIM  Take 100 mg by mouth daily.     amLODipine 10 MG tablet  Commonly known as:  NORVASC  Take 1 tablet (10 mg total) by mouth daily.     azithromycin 250 MG tablet  Commonly known as:  ZITHROMAX Z-PAK  2 po day one, then 1 daily x 4 days     glimepiride 4 MG tablet  Commonly known as:  AMARYL  Take 4 mg by mouth daily with breakfast.      glimepiride 1 MG tablet  Commonly known as:  AMARYL  Take 1 tablet (1 mg total) by mouth daily before breakfast.     hydrochlorothiazide 50 MG tablet  Commonly known as:  HYDRODIURIL  Take 1 tablet (50 mg total) by mouth daily.     HYDROcodone-acetaminophen 5-325 MG per tablet  Commonly known as:  NORCO/VICODIN  Take 2 tablets by mouth every 6 (six) hours as needed for moderate pain or severe pain (pain).     HYDROcodone-acetaminophen 5-325 MG per tablet  Commonly known as:  NORCO/VICODIN  Take 1 tablet by mouth every 4 (four) hours as needed for moderate pain.     lisinopril 5 MG tablet  Commonly known as:  PRINIVIL,ZESTRIL  Take 5 mg by mouth every morning.     meclizine 25 MG tablet  Commonly known as:  ANTIVERT  Take 1 tablet (25 mg total) by mouth 3 (three) times daily as needed for dizziness.     meloxicam 7.5 MG tablet  Commonly known as:  MOBIC  Take 7.5 mg by mouth daily.  metFORMIN 500 MG tablet  Commonly known as:  GLUCOPHAGE  Take 500 mg by mouth 2 (two) times daily with a meal.     naproxen 500 MG tablet  Commonly known as:  NAPROSYN  Take 1 tablet (500 mg total) by mouth 2 (two) times daily.     pantoprazole 20 MG tablet  Commonly known as:  PROTONIX  Take 1 tablet (20 mg total) by mouth daily.     pioglitazone 15 MG tablet  Commonly known as:  ACTOS  Take 15 mg by mouth daily.     sucralfate 1 G tablet  Commonly known as:  CARAFATE  Take 1 tablet (1 g total) by mouth 4 (four) times daily -  with meals and at bedtime.        Activity: no heavy lifting for 8 weeks Diet: diabetic diet Wound Care: none needed  Follow-up:  With Dr. Dalbert Batman in 3 weeks. Follow-up: with primary physician at [facility] within 7 days.    Signed: Edsel Petrin. Dalbert Batman, M.D., FACS General and minimally invasive surgery Breast and Colorectal Surgery  07/27/2014, 5:54 AM

## 2014-07-27 NOTE — Discharge Instructions (Signed)
-  see above 

## 2014-07-27 NOTE — Progress Notes (Signed)
Nurse reviewed discharge instructions with pt and family.  Pt verbalized understanding of discharge instructions, follow up appointments and new medications.  No concerns a time of discharge.

## 2014-07-27 NOTE — Progress Notes (Signed)
Occupational Therapy Treatment Patient Details Name: Sarah [name] MRN: [id number] DOB: [birth year] Today's Date: 07/27/2014    History of present illness s/p hernia repair   OT comments  Pt is progressing well with current goals and is making strides toward going home.  If pt has some assist at home for her and her husband she should be able to d/c home safely with [place]. Pt at min guard to S level with adls at this time.  Follow Up Recommendations  Home health OT    Equipment Recommendations  Tub/shower seat;None recommended by OT    Recommendations for Other Services      Precautions / Restrictions Precautions Precautions: Fall Precaution Comments: ABD binder Restrictions Weight Bearing Restrictions: No       Mobility Bed Mobility               General bed mobility comments: Pt up in chair upon arrival  Transfers Overall transfer level: Needs assistance Equipment used: None;1 person hand held assist Transfers: Sit to/from Stand Sit to Stand: Supervision         General transfer comment: pt improving with safety each day.    Balance Overall balance assessment: Needs assistance         Standing balance support: During functional activity;Bilateral upper extremity supported Standing balance-Leahy Scale: Fair                     ADL Overall ADL's : Needs assistance/impaired Eating/Feeding: Independent   Grooming: Wash/dry hands;Wash/dry face;Oral care;Supervision/safety;Standing Grooming Details (indicate cue type and reason): pt stood at sink and groomed w no physical assist. Upper Body Bathing: Set up;Sitting   Lower Body Bathing: Set up;Sit to/from stand   Upper Body Dressing : Set up;Sitting   Lower Body Dressing: Supervision/safety;Sit to/from stand Lower Body Dressing Details (indicate cue type and reason): pt able to now cross legs to donn socks and shoes. Toilet Transfer: Supervision/safety;Ambulation   Toileting- Clothing  Manipulation and Hygiene: Supervision/safety;Sit to/from stand       Functional mobility during ADLs: Supervision/safety;Cueing for safety General ADL Comments: Pt has decided on Home with HH.  Pt to have 24 hour assist from outside help to assist with her and her husband.      Vision                     Perception     Praxis      Cognition   Behavior During Therapy: WFL for tasks assessed/performed Overall Cognitive Status: Within Functional Limits for tasks assessed                       Extremity/Trunk Assessment               Exercises     Shoulder Instructions       General Comments      Pertinent Vitals/ Pain       Pain Assessment: 0-10 Pain Score: 5  Pain Location: abdomen Pain Descriptors / Indicators: Sore Pain Intervention(s): Repositioned;Patient requesting pain meds-RN notified  Home Living                                          Prior Functioning/Environment              Frequency Min 2X/week     Progress Toward Goals  OT  Goals(current goals can now be found in the care plan section)  Progress towards OT goals: Progressing toward goals  Acute Rehab OT Goals Patient Stated Goal: not be sick and no pain OT Goal Formulation: With patient Time For Goal Achievement: 08/05/14 Potential to Achieve Goals: Good ADL Goals Pt Will Perform Grooming: with supervision;standing Pt Will Perform Lower Body Dressing: with supervision;sit to/from stand Pt Will Transfer to Toilet: with supervision;regular height toilet Pt Will Perform Toileting - Clothing Manipulation and hygiene: with supervision;sit to/from stand  Plan Frequency needs to be updated    Co-evaluation                 End of Session     Activity Tolerance Patient tolerated treatment well   Patient Left in chair;with family/visitor present   Nurse Communication Mobility status        Time: 3419-3790 OT Time Calculation (min): 14  min  Charges: OT General Charges $OT Visit: 1 Procedure OT Treatments $Self Care/Home Management : 8-22 mins  Glenford Peers 07/27/2014, 8:56 AM  [phone]

## 2014-08-11 ENCOUNTER — Ambulatory Visit: Payer: Medicare Other | Attending: Internal Medicine | Admitting: Internal Medicine

## 2014-08-11 ENCOUNTER — Encounter: Payer: Self-pay | Admitting: Internal Medicine

## 2014-08-11 VITALS — BP 118/82 | HR 93 | Temp 98.0°F | Resp 16 | Wt 212.4 lb

## 2014-08-11 DIAGNOSIS — E162 Hypoglycemia, unspecified: Secondary | ICD-10-CM

## 2014-08-11 DIAGNOSIS — I10 Essential (primary) hypertension: Secondary | ICD-10-CM

## 2014-08-11 DIAGNOSIS — K460 Unspecified abdominal hernia with obstruction, without gangrene: Secondary | ICD-10-CM

## 2014-08-11 DIAGNOSIS — R42 Dizziness and giddiness: Secondary | ICD-10-CM

## 2014-08-11 DIAGNOSIS — K436 Other and unspecified ventral hernia with obstruction, without gangrene: Secondary | ICD-10-CM

## 2014-08-11 DIAGNOSIS — E139 Other specified diabetes mellitus without complications: Secondary | ICD-10-CM

## 2014-08-11 DIAGNOSIS — J449 Chronic obstructive pulmonary disease, unspecified: Secondary | ICD-10-CM

## 2014-08-11 DIAGNOSIS — Z720 Tobacco use: Secondary | ICD-10-CM

## 2014-08-11 LAB — COMPLETE METABOLIC PANEL WITH GFR
ALT: <8 U/L (ref 0–35)
AST: 9 U/L (ref 0–37)
Albumin: 4.2 g/dL (ref 3.5–5.2)
Alkaline Phosphatase: 81 U/L (ref 39–117)
BILIRUBIN TOTAL: 0.4 mg/dL (ref 0.2–1.2)
BUN: 9 mg/dL (ref 6–23)
CHLORIDE: 96 meq/L (ref 96–112)
CO2: 29 mEq/L (ref 19–32)
Calcium: 9.9 mg/dL (ref 8.4–10.5)
Creat: 0.69 mg/dL (ref 0.50–1.10)
Glucose, Bld: 96 mg/dL (ref 70–99)
POTASSIUM: 4.2 meq/L (ref 3.5–5.3)
Sodium: 135 mEq/L (ref 135–145)
Total Protein: 7.9 g/dL (ref 6.0–8.3)

## 2014-08-11 LAB — GLUCOSE, POCT (MANUAL RESULT ENTRY): POC Glucose: 121 mg/dl — AB (ref 70–99)

## 2014-08-11 MED ORDER — NICOTINE 21 MG/24HR TD PT24: 21.0000 mg | MEDICATED_PATCH | Freq: Every day | TRANSDERMAL | Status: DC

## 2014-08-11 NOTE — Progress Notes (Signed)
Patient here for follow up Complains of feeling dizzy Still having abd pain from her hernia surgery this past Feb

## 2014-08-11 NOTE — Patient Instructions (Signed)
Hypoglycemia Hypoglycemia occurs when the glucose in your blood is too low. Glucose is a type of sugar that is your body's main energy source. Hormones, such as insulin and glucagon, control the level of glucose in the blood. Insulin lowers blood glucose and glucagon increases blood glucose. Having too much insulin in your blood stream, or not eating enough food containing sugar, can result in hypoglycemia. Hypoglycemia can happen to people with or without diabetes. It can develop quickly and can be a medical emergency.  CAUSES   Missing or delaying meals.  Not eating enough carbohydrates at meals.  Taking too much diabetes medicine.  Not timing your oral diabetes medicine or insulin doses with meals, snacks, and exercise.  Nausea and vomiting.  Certain medicines.  Severe illnesses, such as hepatitis, kidney disorders, and certain eating disorders.  Increased activity or exercise without eating something extra or adjusting medicines.  Drinking too much alcohol.  A nerve disorder that affects body functions like your heart rate, blood pressure, and digestion (autonomic neuropathy).  A condition where the stomach muscles do not function properly (gastroparesis). Therefore, medicines and food may not absorb properly.  Rarely, a tumor of the pancreas can produce too much insulin. SYMPTOMS   Hunger.  Sweating (diaphoresis).  Change in body temperature.  Shakiness.  Headache.  Anxiety.  Lightheadedness.  Irritability.  Difficulty concentrating.  Dry mouth.  Tingling or numbness in the hands or feet.  Restless sleep or sleep disturbances.  Altered speech and coordination.  Change in mental status.  Seizures or prolonged convulsions.  Combativeness.  Drowsiness (lethargic).  Weakness.  Increased heart rate or palpitations.  Confusion.  Pale, gray skin color.  Blurred or double vision.  Fainting. DIAGNOSIS  A physical exam and medical history will be  performed. Your caregiver may make a diagnosis based on your symptoms. Blood tests and other lab tests may be performed to confirm a diagnosis. Once the diagnosis is made, your caregiver will see if your signs and symptoms go away once your blood glucose is raised.  TREATMENT  Usually, you can easily treat your hypoglycemia when you notice symptoms.  Check your blood glucose. If it is less than 70 mg/dl, take one of the following:   3-4 glucose tablets.    cup juice.    cup regular soda.   1 cup skim milk.   -1 tube of glucose gel.   5-6 hard candies.   Avoid high-fat drinks or food that may delay a rise in blood glucose levels.  Do not take more than the recommended amount of sugary foods, drinks, gel, or tablets. Doing so will cause your blood glucose to go too high.   Wait 10-15 minutes and recheck your blood glucose. If it is still less than 70 mg/dl or below your target range, repeat treatment.   Eat a snack if it is more than 1 hour until your next meal.  There may be a time when your blood glucose may go so low that you are unable to treat yourself at home when you start to notice symptoms. You may need someone to help you. You may even faint or be unable to swallow. If you cannot treat yourself, someone will need to bring you to the hospital.  HOME CARE INSTRUCTIONS  If you have diabetes, follow your diabetes management plan by:  Taking your medicines as directed.  Following your exercise plan.  Following your meal plan. Do not skip meals. Eat on time.  Testing your blood   glucose regularly. Check your blood glucose before and after exercise. If you exercise longer or different than usual, be sure to check blood glucose more frequently.  Wearing your medical alert jewelry that says you have diabetes.  Identify the cause of your hypoglycemia. Then, develop ways to prevent the recurrence of hypoglycemia.  Do not take a hot bath or shower right after an  insulin shot.  Always carry treatment with you. Glucose tablets are the easiest to carry.  If you are going to drink alcohol, drink it only with meals.  Tell friends or family members ways to keep you safe during a seizure. This may include removing hard or sharp objects from the area or turning you on your side.  Maintain a healthy weight. SEEK MEDICAL CARE IF:   You are having problems keeping your blood glucose in your target range.  You are having frequent episodes of hypoglycemia.  You feel you might be having side effects from your medicines.  You are not sure why your blood glucose is dropping so low.  You notice a change in vision or a new problem with your vision. SEEK IMMEDIATE MEDICAL CARE IF:   Confusion develops.  A change in mental status occurs.  The inability to swallow develops.  Fainting occurs. Document Released: 06/04/2005 Document Revised: 06/09/2013 Document Reviewed: 10/01/2011 [facility] Patient Information 2015 [place], [place]. This information is not intended to replace advice given to you by your health care provider. Make sure you discuss any questions you have with your health care provider. Diabetes Mellitus and Food It is important for you to manage your blood sugar (glucose) level. Your blood glucose level can be greatly affected by what you eat. Eating healthier foods in the appropriate amounts throughout the day at about the same time each day will help you control your blood glucose level. It can also help slow or prevent worsening of your diabetes mellitus. Healthy eating may even help you improve the level of your blood pressure and reach or maintain a healthy weight.  HOW CAN FOOD AFFECT ME? Carbohydrates Carbohydrates affect your blood glucose level more than any other type of food. Your dietitian will help you determine how many carbohydrates to eat at each meal and teach you how to count carbohydrates. Counting carbohydrates is important to  keep your blood glucose at a healthy level, especially if you are using insulin or taking certain medicines for diabetes mellitus. Alcohol Alcohol can cause sudden decreases in blood glucose (hypoglycemia), especially if you use insulin or take certain medicines for diabetes mellitus. Hypoglycemia can be a life-threatening condition. Symptoms of hypoglycemia (sleepiness, dizziness, and disorientation) are similar to symptoms of having too much alcohol.  If your health care provider has given you approval to drink alcohol, do so in moderation and use the following guidelines:  Women should not have more than one drink per day, and men should not have more than two drinks per day. One drink is equal to:  12 oz of beer.  5 oz of wine.  1 oz of hard liquor.  Do not drink on an empty stomach.  Keep yourself hydrated. Have water, diet soda, or unsweetened iced tea.  Regular soda, juice, and other mixers might contain a lot of carbohydrates and should be counted. WHAT FOODS ARE NOT RECOMMENDED? As you make food choices, it is important to remember that all foods are not the same. Some foods have fewer nutrients per serving than other foods, even though they might have  the same number of calories or carbohydrates. It is difficult to get your body what it needs when you eat foods with fewer nutrients. Examples of foods that you should avoid that are high in calories and carbohydrates but low in nutrients include:  Trans fats (most processed foods list trans fats on the Nutrition Facts label).  Regular soda.  Juice.  Candy.  Sweets, such as cake, pie, doughnuts, and cookies.  Fried foods. WHAT FOODS CAN I EAT? Have nutrient-rich foods, which will nourish your body and keep you healthy. The food you should eat also will depend on several factors, including:  The calories you need.  The medicines you take.  Your weight.  Your blood glucose level.  Your blood pressure level.  Your  cholesterol level. You also should eat a variety of foods, including:  Protein, such as meat, poultry, fish, tofu, nuts, and seeds (lean animal proteins are best).  Fruits.  Vegetables.  Dairy products, such as milk, cheese, and yogurt (low fat is best).  Breads, grains, pasta, cereal, rice, and beans.  Fats such as olive oil, trans fat-free margarine, canola oil, avocado, and olives. DOES EVERYONE WITH DIABETES MELLITUS HAVE THE SAME MEAL PLAN? Because every person with diabetes mellitus is different, there is not one meal plan that works for everyone. It is very important that you meet with a dietitian who will help you create a meal plan that is just right for you. Document Released: 03/01/2005 Document Revised: 06/09/2013 Document Reviewed: 05/01/2013 [facility] Patient Information 2015 [place], [place]. This information is not intended to replace advice given to you by your health care provider. Make sure you discuss any questions you have with your health care provider.

## 2014-08-11 NOTE — Progress Notes (Signed)
MRN: [id number] Name: Sarah [name]  Sex: female Age: 59 y.o. DOB: 05-May-1956  Allergies: Aspirin; Ibuprofen; and Penicillins  Chief Complaint  Patient presents with  . Follow-up    HPI: Patient is 59 y.o. female who  has history of COPD, hypertension, diabetes, recently hospitalized with symptoms of ongoing abdominal pain, patient underwent laparoscopic repair of incarcerated ventral hernia and umbilical hernia with insertion of mesh, patient also developed some hypoxia which was thought to be likely secondary to COPD, she was continued with Pulmicort and nebulization and continued with Spiriva, her diabetes is well controlled her last hemoglobin A1c was 6.3%, patient also underwent PT/OT, patient was advised to follow with the surgery after discharge. Patient reports occasional headache dizziness, patient brought the fingerstick log noticed sometimes her blood sugar drops and 60s, patient is currently taking Actos, Amaryl, metformin, as per patient she was also prescribed meclizine which she has not taken for dizziness. Her blood pressure is also well controlled. She still has occasional abdominal pain, has also occasional constipation.  Past Medical History  Diagnosis Date  . Hypertension   . Asthma   . Gastritis   . Bacterial vaginosis   . GERD (gastroesophageal reflux disease)   . Diabetes type 2, uncontrolled     01/04/13- pt denies having diabetes  . Abdominal wall hernia 01/05/2013  . Pneumonia   . Diabetes mellitus without complication   . Headache   . Arthritis   . Blood clot in vein 2000    left knee after surgery    Past Surgical History  Procedure Laterality Date  . Left knee surgery Left 2000    arthroscopy and then blood clot removed  . Abdominal hysterectomy  1995    complete  . Dilation and curettage of uterus    . Ventral hernia repair N/A 07/20/2014    Procedure: LAPAROSCOPIC REPAIR INCARCARATED VENTRAL HERNIA AND UMBILICAL HERNIA;  Surgeon: Fanny Skates,  MD;  Location: [facility];  Service: General;  Laterality: N/A;  . Insertion of mesh N/A 07/20/2014    Procedure: INSERTION OF MESH;  Surgeon: Fanny Skates, MD;  Location: [facility];  Service: General;  Laterality: N/A;      Medication List       This list is accurate as of: 08/11/14  4:32 PM.  Always use your most recent med list.               albuterol 108 (90 BASE) MCG/ACT inhaler  Commonly known as:  PROVENTIL HFA;VENTOLIN HFA  Inhale 2 puffs into the lungs every 6 (six) hours as needed for wheezing.     allopurinol 100 MG tablet  Commonly known as:  ZYLOPRIM  Take 100 mg by mouth daily.     amLODipine 10 MG tablet  Commonly known as:  NORVASC  Take 1 tablet (10 mg total) by mouth daily.     azithromycin 250 MG tablet  Commonly known as:  ZITHROMAX Z-PAK  2 po day one, then 1 daily x 4 days     glimepiride 4 MG tablet  Commonly known as:  AMARYL  Take 4 mg by mouth daily with breakfast.     glimepiride 1 MG tablet  Commonly known as:  AMARYL  Take 1 tablet (1 mg total) by mouth daily before breakfast.     hydrochlorothiazide 50 MG tablet  Commonly known as:  HYDRODIURIL  Take 1 tablet (50 mg total) by mouth daily.     HYDROcodone-acetaminophen 5-325 MG per tablet  Commonly known as:  NORCO/VICODIN  Take 2 tablets by mouth every 6 (six) hours as needed for moderate pain or severe pain (pain).     HYDROcodone-acetaminophen 5-325 MG per tablet  Commonly known as:  NORCO/VICODIN  Take 1 tablet by mouth every 4 (four) hours as needed for moderate pain.     lisinopril 5 MG tablet  Commonly known as:  PRINIVIL,ZESTRIL  Take 5 mg by mouth every morning.     meclizine 25 MG tablet  Commonly known as:  ANTIVERT  Take 1 tablet (25 mg total) by mouth 3 (three) times daily as needed for dizziness.     meloxicam 7.5 MG tablet  Commonly known as:  MOBIC  Take 7.5 mg by mouth daily.     metFORMIN 500 MG tablet  Commonly known as:  GLUCOPHAGE  Take 500 mg by mouth 2 (two)  times daily with a meal.     naproxen 500 MG tablet  Commonly known as:  NAPROSYN  Take 1 tablet (500 mg total) by mouth 2 (two) times daily.     nicotine 21 mg/24hr patch  Commonly known as:  NICODERM CQ  Place 1 patch (21 mg total) onto the skin daily.     pantoprazole 20 MG tablet  Commonly known as:  PROTONIX  Take 1 tablet (20 mg total) by mouth daily.     pioglitazone 15 MG tablet  Commonly known as:  ACTOS  Take 15 mg by mouth daily.     sucralfate 1 G tablet  Commonly known as:  CARAFATE  Take 1 tablet (1 g total) by mouth 4 (four) times daily -  with meals and at bedtime.        Meds ordered this encounter  Medications  . nicotine (NICODERM CQ) 21 mg/24hr patch    Sig: Place 1 patch (21 mg total) onto the skin daily.    Dispense:  28 patch    Refill:  0    Immunization History  Administered Date(s) Administered  . Influenza-Unspecified 04/18/2013    History reviewed. No pertinent family history.  History  Substance Use Topics  . Smoking status: Current Every Day Smoker -- 1.50 packs/day for 40 years    Types: Cigarettes  . Smokeless tobacco: Never Used  . Alcohol Use: No    Review of Systems   As noted in HPI  Filed Vitals:   08/11/14 1505  BP: 118/82  Pulse: 93  Temp: 98 F (36.7 C)  Resp: 16    Physical Exam  Physical Exam  Eyes: EOM are normal. Pupils are equal, round, and reactive to light.  Cardiovascular: Normal rate and regular rhythm.   Pulmonary/Chest: Breath sounds normal. No respiratory distress. She has no wheezes. She has no rales.  Abdominal: Soft. There is no tenderness. There is no rebound.    CBC    Component Value Date/Time   WBC 6.2 07/25/2014 0613   RBC 4.26 07/25/2014 0613   HGB 11.5* 07/25/2014 0613   HCT 35.2* 07/25/2014 0613   PLT 247 07/25/2014 0613   MCV 82.6 07/25/2014 0613   LYMPHSABS 3.1 07/06/2014 1405   MONOABS 0.4 07/06/2014 1405   EOSABS 0.1 07/06/2014 1405   BASOSABS 0.0 07/06/2014 1405     CMP     Component Value Date/Time   NA 134* 07/26/2014 0550   K 3.7 07/26/2014 0550   CL 97 07/26/2014 0550   CO2 26 07/26/2014 0550   GLUCOSE 100* 07/26/2014 0550   BUN 9  07/26/2014 0550   CREATININE 0.71 07/27/2014 0430   CALCIUM 9.2 07/26/2014 0550   PROT 7.7 07/06/2014 1405   ALBUMIN 3.9 07/06/2014 1405   AST 13 07/06/2014 1405   ALT 10 07/06/2014 1405   ALKPHOS 76 07/06/2014 1405   BILITOT 0.8 07/06/2014 1405   GFRNONAA >90 07/27/2014 0430   GFRAA >90 07/27/2014 0430    Lab Results  Component Value Date/Time   CHOL 176 01/05/2013 10:41 AM    No components found for: HGA1C  Lab Results  Component Value Date/Time   AST 13 07/06/2014 02:05 PM    Assessment and Plan  Other specified diabetes mellitus without complications - Plan: Results for orders placed or performed in visit on 08/11/14  Glucose (CBG)  Result Value Ref Range   POC Glucose 121 (A) 70 - 99 mg/dl   Diabetes is well controlled sometimes she has hypoglycemia symptoms, I have discontinued Actos, she'll continue with Amaryl and metformin, repeat A1c in 3 months.  Essential hypertension, benign - Plan: blood pressure is well controlled, continue with current medications including amlodipine lisinopril and hydrochlorothiazide. COMPLETE METABOLIC PANEL WITH GFR  Dizziness and giddiness ?Secondary to hypoglycemia, I have reduced her diabetes medication dosage. Also patient has meclizine to use when necessary  Incarcerated ventral hernia Status post laparoscopic repair, patient to follow with surgery  Hypoglycemia Patient is educated about hypoglycemic symptoms.  Tobacco abuse - Plan: nicotine (NICODERM CQ) 21 mg/24hr patch  COPD mixed type Patient is currently on Symbicort, again counseled to quit smoking, she'll try nicotine patch.  Health Maintenance  -Vaccinations:  -patient declined flu shot and Pneumovax  Return in about 3 months (around 11/09/2014).   This note has been created  with Surveyor, quantity. Any transcriptional errors are unintentional.    Lorayne Marek, MD

## 2014-08-12 ENCOUNTER — Telehealth: Payer: Self-pay

## 2014-08-12 NOTE — Telephone Encounter (Signed)
-----   Message from Lorayne Marek, MD sent at 08/12/2014  9:15 AM EST ----- Call and let the Patient know that blood work is normal.

## 2014-08-12 NOTE — Telephone Encounter (Signed)
Patient is aware of her lab results 

## 2014-09-09 ENCOUNTER — Telehealth: Payer: Self-pay | Admitting: Internal Medicine

## 2014-09-09 ENCOUNTER — Other Ambulatory Visit: Payer: Self-pay | Admitting: Emergency Medicine

## 2014-09-09 MED ORDER — AMLODIPINE BESYLATE 10 MG PO TABS: 10.0000 mg | ORAL_TABLET | Freq: Every day | ORAL | Status: DC

## 2014-09-09 MED ORDER — HYDROCHLOROTHIAZIDE 50 MG PO TABS: 50.0000 mg | ORAL_TABLET | Freq: Every day | ORAL | Status: DC

## 2014-09-09 MED ORDER — GLIMEPIRIDE 4 MG PO TABS: 4.0000 mg | ORAL_TABLET | Freq: Every day | ORAL | Status: DC

## 2014-09-09 MED ORDER — GLIMEPIRIDE 1 MG PO TABS: 1.0000 mg | ORAL_TABLET | Freq: Every day | ORAL | Status: DC

## 2014-09-09 MED ORDER — METFORMIN HCL 500 MG PO TABS: 500.0000 mg | ORAL_TABLET | Freq: Two times a day (BID) | ORAL | Status: DC

## 2014-09-09 MED ORDER — PANTOPRAZOLE SODIUM 20 MG PO TBEC: 20.0000 mg | DELAYED_RELEASE_TABLET | Freq: Every day | ORAL | Status: DC

## 2014-09-09 MED ORDER — MELOXICAM 7.5 MG PO TABS: 7.5000 mg | ORAL_TABLET | Freq: Every day | ORAL | Status: DC

## 2014-09-13 ENCOUNTER — Telehealth: Payer: Self-pay | Admitting: Internal Medicine

## 2014-09-13 ENCOUNTER — Other Ambulatory Visit: Payer: Self-pay | Admitting: Internal Medicine

## 2014-09-13 MED ORDER — ALLOPURINOL 100 MG PO TABS: 100.0000 mg | ORAL_TABLET | Freq: Every day | ORAL | Status: DC

## 2014-09-13 MED ORDER — LISINOPRIL 5 MG PO TABS: 5.0000 mg | ORAL_TABLET | Freq: Every morning | ORAL | Status: DC

## 2014-09-13 NOTE — Telephone Encounter (Signed)
Patient is requesting a refill for allopurinol (ZYLOPRIM) 100 MG tablet and allopurinol (ZYLOPRIM) 100 MG tablet. Please follow up with pt.

## 2014-09-14 NOTE — Telephone Encounter (Signed)
Patient is requesting a refill for allopurinol (ZYLOPRIM) 100 MG tablet and allopurinol (ZYLOPRIM) 100 MG tablet. Please follow up with pt.

## 2014-09-14 NOTE — Telephone Encounter (Signed)
Apparently the prescription was done yesterday, can you check with the pharmacy

## 2014-09-16 ENCOUNTER — Ambulatory Visit: Payer: Medicare Other | Attending: Internal Medicine | Admitting: Internal Medicine

## 2014-09-16 ENCOUNTER — Encounter: Payer: Self-pay | Admitting: Internal Medicine

## 2014-09-16 VITALS — BP 130/87 | HR 76 | Temp 98.0°F | Resp 16 | Wt 209.0 lb

## 2014-09-16 DIAGNOSIS — R109 Unspecified abdominal pain: Secondary | ICD-10-CM

## 2014-09-16 DIAGNOSIS — E119 Type 2 diabetes mellitus without complications: Secondary | ICD-10-CM | POA: Diagnosis present

## 2014-09-16 DIAGNOSIS — H109 Unspecified conjunctivitis: Secondary | ICD-10-CM

## 2014-09-16 DIAGNOSIS — F17210 Nicotine dependence, cigarettes, uncomplicated: Secondary | ICD-10-CM | POA: Insufficient documentation

## 2014-09-16 DIAGNOSIS — E162 Hypoglycemia, unspecified: Secondary | ICD-10-CM | POA: Diagnosis not present

## 2014-09-16 DIAGNOSIS — E139 Other specified diabetes mellitus without complications: Secondary | ICD-10-CM | POA: Diagnosis not present

## 2014-09-16 DIAGNOSIS — Z720 Tobacco use: Secondary | ICD-10-CM | POA: Diagnosis not present

## 2014-09-16 LAB — GLUCOSE, POCT (MANUAL RESULT ENTRY): POC Glucose: 121 mg/dl — AB (ref 70–99)

## 2014-09-16 MED ORDER — OFLOXACIN 0.3 % OP SOLN: 1.0000 [drp] | Freq: Four times a day (QID) | OPHTHALMIC | Status: DC

## 2014-09-16 MED ORDER — BLOOD GLUCOSE MONITOR KIT: PACK | Status: DC

## 2014-09-16 MED ORDER — GLUCOCOM LANCETS 28G MISC: Status: DC

## 2014-09-16 MED ORDER — GLUCOSE BLOOD VI STRP: ORAL_STRIP | Status: DC

## 2014-09-16 MED ORDER — NICOTINE 21 MG/24HR TD PT24: 21.0000 mg | MEDICATED_PATCH | Freq: Every day | TRANSDERMAL | Status: DC

## 2014-09-16 NOTE — Progress Notes (Signed)
Patient here for follow up on her diabetes Complains of muscle spasms in her stomach Waked up  In the morning drenched in sweat Left eye has been red with discharge for the last four days

## 2014-09-16 NOTE — Progress Notes (Signed)
MRN: [id number] Name: Sarah [name]  Sex: female Age: 59 y.o. DOB: 1956-02-11  Allergies: Aspirin; Ibuprofen; and Penicillins  Chief Complaint  Patient presents with  . Spasms    HPI: Patient is 59 y.o. female who has is she of hyper tension, diabetes comes today complaining of left eye redness and discharge since the morning denies any fever chills chest pain or shortness of breath, she brought the fingerstick log, noted to have her blood sugar drops into 70s also patient complaining of abdominal cramps currently patient is on metformin as well as Amaryl, patient also has recently been treated for abdominal wall hernia in February, the symptoms she feels is more worse at night at that time she sweat a lot, denies any fever chills.  Past Medical History  Diagnosis Date  . Hypertension   . Asthma   . Gastritis   . Bacterial vaginosis   . GERD (gastroesophageal reflux disease)   . Diabetes type 2, uncontrolled     01/04/13- pt denies having diabetes  . Abdominal wall hernia 01/05/2013  . Pneumonia   . Diabetes mellitus without complication   . Headache   . Arthritis   . Blood clot in vein 2000    left knee after surgery    Past Surgical History  Procedure Laterality Date  . Left knee surgery Left 2000    arthroscopy and then blood clot removed  . Abdominal hysterectomy  1995    complete  . Dilation and curettage of uterus    . Ventral hernia repair N/A 07/20/2014    Procedure: LAPAROSCOPIC REPAIR INCARCARATED VENTRAL HERNIA AND UMBILICAL HERNIA;  Surgeon: Fanny Skates, MD;  Location: [facility];  Service: General;  Laterality: N/A;  . Insertion of mesh N/A 07/20/2014    Procedure: INSERTION OF MESH;  Surgeon: Fanny Skates, MD;  Location: [facility];  Service: General;  Laterality: N/A;      Medication List       This list is accurate as of: 09/16/14 11:00 AM.  Always use your most recent med list.               albuterol 108 (90 BASE) MCG/ACT inhaler  Commonly known  as:  PROVENTIL HFA;VENTOLIN HFA  Inhale 2 puffs into the lungs every 6 (six) hours as needed for wheezing.     allopurinol 100 MG tablet  Commonly known as:  ZYLOPRIM  Take 1 tablet (100 mg total) by mouth daily.     amLODipine 10 MG tablet  Commonly known as:  NORVASC  Take 1 tablet (10 mg total) by mouth daily.     azithromycin 250 MG tablet  Commonly known as:  ZITHROMAX Z-PAK  2 po day one, then 1 daily x 4 days     blood glucose meter kit and supplies Kit  Check blood sugar TID & QHS     glimepiride 1 MG tablet  Commonly known as:  AMARYL  Take 1 tablet (1 mg total) by mouth daily before breakfast.     glimepiride 4 MG tablet  Commonly known as:  AMARYL  Take 1 tablet (4 mg total) by mouth daily with breakfast.     GlucoCom Lancets Misc  Check blood sugar TID & QHS     glucose blood test strip  Commonly known as:  CHOICE DM FORA G20 TEST STRIPS  Check blood sugar TID & QHS     hydrochlorothiazide 50 MG tablet  Commonly known as:  HYDRODIURIL  Take 1  tablet (50 mg total) by mouth daily.     HYDROcodone-acetaminophen 5-325 MG per tablet  Commonly known as:  NORCO/VICODIN  Take 2 tablets by mouth every 6 (six) hours as needed for moderate pain or severe pain (pain).     HYDROcodone-acetaminophen 5-325 MG per tablet  Commonly known as:  NORCO/VICODIN  Take 1 tablet by mouth every 4 (four) hours as needed for moderate pain.     lisinopril 5 MG tablet  Commonly known as:  PRINIVIL,ZESTRIL  Take 1 tablet (5 mg total) by mouth every morning.     meclizine 25 MG tablet  Commonly known as:  ANTIVERT  Take 1 tablet (25 mg total) by mouth 3 (three) times daily as needed for dizziness.     meloxicam 7.5 MG tablet  Commonly known as:  MOBIC  Take 1 tablet (7.5 mg total) by mouth daily.     metFORMIN 500 MG tablet  Commonly known as:  GLUCOPHAGE  Take 1 tablet (500 mg total) by mouth 2 (two) times daily with a meal.     naproxen 500 MG tablet  Commonly known as:   NAPROSYN  Take 1 tablet (500 mg total) by mouth 2 (two) times daily.     nicotine 21 mg/24hr patch  Commonly known as:  NICODERM CQ  Place 1 patch (21 mg total) onto the skin daily.     ofloxacin 0.3 % ophthalmic solution  Commonly known as:  OCUFLOX  Place 1 drop into both eyes 4 (four) times daily.     pantoprazole 20 MG tablet  Commonly known as:  PROTONIX  Take 1 tablet (20 mg total) by mouth daily.     pioglitazone 15 MG tablet  Commonly known as:  ACTOS  Take 15 mg by mouth daily.     sucralfate 1 G tablet  Commonly known as:  CARAFATE  Take 1 tablet (1 g total) by mouth 4 (four) times daily -  with meals and at bedtime.        Meds ordered this encounter  Medications  . blood glucose meter kit and supplies KIT    Sig: Check blood sugar TID & QHS    Dispense:  1 each    Refill:  0    Order Specific Question:  Number of strips    Answer:  100    Order Specific Question:  Number of lancets    Answer:  100  . glucose blood (CHOICE DM FORA G20 TEST STRIPS) test strip    Sig: Check blood sugar TID & QHS    Dispense:  100 each    Refill:  12  . GlucoCom Lancets MISC    Sig: Check blood sugar TID & QHS    Dispense:  100 each    Refill:  0  . nicotine (NICODERM CQ) 21 mg/24hr patch    Sig: Place 1 patch (21 mg total) onto the skin daily.    Dispense:  28 patch    Refill:  0  . ofloxacin (OCUFLOX) 0.3 % ophthalmic solution    Sig: Place 1 drop into both eyes 4 (four) times daily.    Dispense:  5 mL    Refill:  0    Immunization History  Administered Date(s) Administered  . Influenza-Unspecified 04/18/2013    History reviewed. No pertinent family history.  History  Substance Use Topics  . Smoking status: Current Every Day Smoker -- 1.50 packs/day for 40 years    Types: Cigarettes  . Smokeless  tobacco: Never Used  . Alcohol Use: No    Review of Systems   As noted in HPI  Filed Vitals:   09/16/14 0942  BP: 130/87  Pulse: 76  Temp: 98 F (36.7  C)  Resp: 16    Physical Exam  Physical Exam  Constitutional: No distress.  HENT:  Left eye conjunctival injection, no apparent discharge  Eyes: EOM are normal. Pupils are equal, round, and reactive to light.  Neck: Neck supple.  Cardiovascular: Normal rate and regular rhythm.   Pulmonary/Chest: Breath sounds normal. No respiratory distress. She has no wheezes. She has no rales.  Abdominal:  Minimal epigastric tenderness with deep palpation, no rebound or guarding    CBC    Component Value Date/Time   WBC 6.2 07/25/2014 0613   RBC 4.26 07/25/2014 0613   HGB 11.5* 07/25/2014 0613   HCT 35.2* 07/25/2014 0613   PLT 247 07/25/2014 0613   MCV 82.6 07/25/2014 0613   LYMPHSABS 3.1 07/06/2014 1405   MONOABS 0.4 07/06/2014 1405   EOSABS 0.1 07/06/2014 1405   BASOSABS 0.0 07/06/2014 1405    CMP     Component Value Date/Time   NA 135 08/11/2014 1544   K 4.2 08/11/2014 1544   CL 96 08/11/2014 1544   CO2 29 08/11/2014 1544   GLUCOSE 96 08/11/2014 1544   BUN 9 08/11/2014 1544   CREATININE 0.69 08/11/2014 1544   CREATININE 0.71 07/27/2014 0430   CALCIUM 9.9 08/11/2014 1544   PROT 7.9 08/11/2014 1544   ALBUMIN 4.2 08/11/2014 1544   AST 9 08/11/2014 1544   ALT <8 08/11/2014 1544   ALKPHOS 81 08/11/2014 1544   BILITOT 0.4 08/11/2014 1544   GFRNONAA >89 08/11/2014 1544   GFRNONAA >90 07/27/2014 0430   GFRAA >89 08/11/2014 1544   GFRAA >90 07/27/2014 0430    Lab Results  Component Value Date/Time   CHOL 176 01/05/2013 10:41 AM    No components found for: HGA1C  Lab Results  Component Value Date/Time   AST 9 08/11/2014 03:44 PM    Assessment and Plan  Other specified diabetes mellitus without complications - Plan:  Results for orders placed or performed in visit on 09/16/14  Glucose (CBG)  Result Value Ref Range   POC Glucose 121 (A) 70 - 99 mg/dl  since patient is having hypoglycemic episodes as well as complaining of abdominal cramps have advised patient is  currently taking metformin twice a day, she will reduce it to one time a day, continue with her Amaryl as prescribed. We'll recheck A1c in 3 months also patient to keep the fingerstick log   Conjunctivitis, unspecified laterality - Plan: ofloxacin (OCUFLOX) 0.3 % ophthalmic solution  Tobacco abuse - Plan: counseled patient to quit smoking, she's going to try nicotine (NICODERM CQ) 21 mg/24hr patch   Hypoglycemia/ Abdominal cramps  Abdominal cramps ?secondary to hypoglycemia metformin intolerance, she'll reduce the dose of metformin to 1 time a day and keep the fingerstick log.    Return in about 3 months (around 12/16/2014) for diabetes.   This note has been created with Surveyor, quantity. Any transcriptional errors are unintentional.    Lorayne Marek, MD

## 2014-11-17 ENCOUNTER — Emergency Department (HOSPITAL_COMMUNITY): Payer: Medicare Other

## 2014-11-17 ENCOUNTER — Encounter (HOSPITAL_COMMUNITY): Payer: Self-pay | Admitting: Emergency Medicine

## 2014-11-17 ENCOUNTER — Emergency Department (HOSPITAL_COMMUNITY)
Admission: EM | Admit: 2014-11-17 | Discharge: 2014-11-17 | Disposition: A | Discharge status: Home / Self Care | Payer: Medicare Other | Attending: Emergency Medicine | Admitting: Emergency Medicine

## 2014-11-17 DIAGNOSIS — M1990 Unspecified osteoarthritis, unspecified site: Secondary | ICD-10-CM

## 2014-11-17 DIAGNOSIS — M25512 Pain in left shoulder: Secondary | ICD-10-CM | POA: Diagnosis not present

## 2014-11-17 DIAGNOSIS — Z880 Allergy status to penicillin: Secondary | ICD-10-CM | POA: Insufficient documentation

## 2014-11-17 DIAGNOSIS — Z79899 Other long term (current) drug therapy: Secondary | ICD-10-CM | POA: Insufficient documentation

## 2014-11-17 DIAGNOSIS — Z8742 Personal history of other diseases of the female genital tract: Secondary | ICD-10-CM | POA: Diagnosis not present

## 2014-11-17 DIAGNOSIS — Z791 Long term (current) use of non-steroidal anti-inflammatories (NSAID): Secondary | ICD-10-CM | POA: Diagnosis not present

## 2014-11-17 DIAGNOSIS — Z8701 Personal history of pneumonia (recurrent): Secondary | ICD-10-CM | POA: Insufficient documentation

## 2014-11-17 DIAGNOSIS — K219 Gastro-esophageal reflux disease without esophagitis: Secondary | ICD-10-CM | POA: Insufficient documentation

## 2014-11-17 DIAGNOSIS — I10 Essential (primary) hypertension: Secondary | ICD-10-CM | POA: Insufficient documentation

## 2014-11-17 DIAGNOSIS — Z792 Long term (current) use of antibiotics: Secondary | ICD-10-CM | POA: Diagnosis not present

## 2014-11-17 DIAGNOSIS — J45909 Unspecified asthma, uncomplicated: Secondary | ICD-10-CM | POA: Diagnosis not present

## 2014-11-17 DIAGNOSIS — Z720 Tobacco use: Secondary | ICD-10-CM | POA: Insufficient documentation

## 2014-11-17 DIAGNOSIS — M25531 Pain in right wrist: Secondary | ICD-10-CM | POA: Diagnosis present

## 2014-11-17 DIAGNOSIS — E119 Type 2 diabetes mellitus without complications: Secondary | ICD-10-CM | POA: Insufficient documentation

## 2014-11-17 DIAGNOSIS — R52 Pain, unspecified: Secondary | ICD-10-CM

## 2014-11-17 LAB — CBG MONITORING, ED: GLUCOSE-CAPILLARY: 209 mg/dL — AB (ref 65–99)

## 2014-11-17 MED ORDER — HYDROCODONE-ACETAMINOPHEN 5-325 MG PO TABS: 1.0000 | ORAL_TABLET | Freq: Four times a day (QID) | ORAL | Status: DC | PRN

## 2014-11-17 NOTE — ED Provider Notes (Signed)
CSN: [id number]     Arrival date & time 11/17/14  1150 History  This chart was scribed for non-physician practitioner Delos Haring, PA-C working with Lajean Saver, MD by Hilda Lias, ED Scribe. This patient was seen in room WTR5/WTR5 and the patient's care was started at 1:31 PM.    Chief Complaint  Patient presents with  . Arm Pain    left  . right hand pain       The history is provided by the patient. No language interpreter was used.    HPI Comments: Sarah [name] is a 59 y.o. female who presents to the Emergency Department complaining of constant right wrist pain with associated radiating pain to the right elbow and shoulder that began two weeks ago. Pt reports pain "shooting" to her right shoulder. Pt has been using a wrist brace for four days with little relief. Pt denies any known injury to the wrist, but notes being told that she has carpal tunnel in it. Pt also reports constant left shoulder pain that has been present for four days. Pt denies any known injury to the shoulder and notes that she has done very little with her hands and arms since pain began. Pt denies taking any medications for her pain.  She denies having any CP, SOB, fatigue, DOE, lower extremity swelling, headaches, fever, or any other associated symptoms. Pain is exacerbated by palpation and movement.    Past Medical History  Diagnosis Date  . Hypertension   . Asthma   . Gastritis   . Bacterial vaginosis   . GERD (gastroesophageal reflux disease)   . Diabetes type 2, uncontrolled     01/04/13- pt denies having diabetes  . Abdominal wall hernia 01/05/2013  . Pneumonia   . Diabetes mellitus without complication   . Headache   . Arthritis   . Blood clot in vein 2000    left knee after surgery   Past Surgical History  Procedure Laterality Date  . Left knee surgery Left 2000    arthroscopy and then blood clot removed  . Abdominal hysterectomy  1995    complete  . Dilation and curettage of uterus     . Ventral hernia repair N/A 07/20/2014    Procedure: LAPAROSCOPIC REPAIR INCARCARATED VENTRAL HERNIA AND UMBILICAL HERNIA;  Surgeon: Fanny Skates, MD;  Location: [facility];  Service: General;  Laterality: N/A;  . Insertion of mesh N/A 07/20/2014    Procedure: INSERTION OF MESH;  Surgeon: Fanny Skates, MD;  Location: [facility];  Service: General;  Laterality: N/A;   No family history on file. History  Substance Use Topics  . Smoking status: Current Every Day Smoker -- 1.50 packs/day for 40 years    Types: Cigarettes  . Smokeless tobacco: Never Used  . Alcohol Use: No   OB History    No data available     Review of Systems  Musculoskeletal: Positive for myalgias and arthralgias.      Allergies  Aspirin; Ibuprofen; and Penicillins  Home Medications   Prior to Admission medications   Medication Sig Start Date End Date Taking? Authorizing Provider  albuterol (PROVENTIL HFA;VENTOLIN HFA) 108 (90 BASE) MCG/ACT inhaler Inhale 2 puffs into the lungs every 6 (six) hours as needed for wheezing.    Historical Provider, MD  allopurinol (ZYLOPRIM) 100 MG tablet Take 1 tablet (100 mg total) by mouth daily. 09/13/14   Lorayne Marek, MD  amLODipine (NORVASC) 10 MG tablet Take 1 tablet (10 mg total) by mouth  daily. 09/09/14   Lorayne Marek, MD  azithromycin (ZITHROMAX Z-PAK) 250 MG tablet 2 po day one, then 1 daily x 4 days 07/06/14   Veryl Speak, MD  blood glucose meter kit and supplies KIT Check blood sugar TID & QHS 09/16/14   Lorayne Marek, MD  glimepiride (AMARYL) 1 MG tablet Take 1 tablet (1 mg total) by mouth daily before breakfast. 09/09/14   Lorayne Marek, MD  glimepiride (AMARYL) 4 MG tablet Take 1 tablet (4 mg total) by mouth daily with breakfast. 09/09/14   Lorayne Marek, MD  GlucoCom Lancets MISC Check blood sugar TID & QHS 09/16/14   Lorayne Marek, MD  glucose blood (CHOICE DM FORA G20 TEST STRIPS) test strip Check blood sugar TID & QHS 09/16/14   Lorayne Marek, MD  hydrochlorothiazide  (HYDRODIURIL) 50 MG tablet Take 1 tablet (50 mg total) by mouth daily. 09/09/14   Lorayne Marek, MD  HYDROcodone-acetaminophen (NORCO/VICODIN) 5-325 MG per tablet Take 2 tablets by mouth every 6 (six) hours as needed for moderate pain or severe pain (pain).    Historical Provider, MD  HYDROcodone-acetaminophen (NORCO/VICODIN) 5-325 MG per tablet Take 1 tablet by mouth every 4 (four) hours as needed for moderate pain. 07/27/14   Fanny Skates, MD  HYDROcodone-acetaminophen (NORCO/VICODIN) 5-325 MG per tablet Take 1 tablet by mouth every 6 (six) hours as needed. 11/17/14   Torian Thoennes Carlota Raspberry, PA-C  lisinopril (PRINIVIL,ZESTRIL) 5 MG tablet Take 1 tablet (5 mg total) by mouth every morning. 09/13/14   Lorayne Marek, MD  meclizine (ANTIVERT) 25 MG tablet Take 1 tablet (25 mg total) by mouth 3 (three) times daily as needed for dizziness. 07/06/14   Veryl Speak, MD  meloxicam (MOBIC) 7.5 MG tablet Take 1 tablet (7.5 mg total) by mouth daily. 09/09/14   Lorayne Marek, MD  metFORMIN (GLUCOPHAGE) 500 MG tablet Take 1 tablet (500 mg total) by mouth 2 (two) times daily with a meal. 09/09/14   Lorayne Marek, MD  naproxen (NAPROSYN) 500 MG tablet Take 1 tablet (500 mg total) by mouth 2 (two) times daily. Patient not taking: Reported on 05/24/2014 10/22/13   Hazel Sams, PA-C  nicotine (NICODERM CQ) 21 mg/24hr patch Place 1 patch (21 mg total) onto the skin daily. 09/16/14   Lorayne Marek, MD  ofloxacin (OCUFLOX) 0.3 % ophthalmic solution Place 1 drop into both eyes 4 (four) times daily. 09/16/14   Lorayne Marek, MD  pantoprazole (PROTONIX) 20 MG tablet Take 1 tablet (20 mg total) by mouth daily. 09/09/14   Lorayne Marek, MD  pioglitazone (ACTOS) 15 MG tablet Take 15 mg by mouth daily.    Historical Provider, MD  sucralfate (CARAFATE) 1 G tablet Take 1 tablet (1 g total) by mouth 4 (four) times daily -  with meals and at bedtime. Patient not taking: Reported on 06/29/2014 05/24/14   Malvin Johns, MD   BP 133/72 mmHg  Pulse 82   Temp(Src) 98 F (36.7 C) (Oral)  Resp 18  SpO2 99% Physical Exam  Constitutional: She is oriented to person, place, and time. She appears well-developed and well-nourished.  HENT:  Head: Normocephalic and atraumatic.  Cardiovascular: Normal rate.   Pulmonary/Chest: Effort normal and breath sounds normal. She exhibits no tenderness.  Abdominal: She exhibits no distension.  Musculoskeletal:       Left shoulder: She exhibits decreased range of motion (due to pain), tenderness, pain and spasm. She exhibits no bony tenderness, no swelling, no effusion, no crepitus, no deformity, no laceration, normal pulse and normal  strength (pts grip streng is 4+/4+).       Right wrist: She exhibits decreased range of motion (mild swelling, no induration, erythema, ecchymosis or rash,), tenderness and swelling. She exhibits no bony tenderness, no effusion, no crepitus, no deformity and no laceration.  Neurological: She is alert and oriented to person, place, and time.  Skin: Skin is warm and dry.  Psychiatric: She has a normal mood and affect.  Nursing note and vitals reviewed.   ED Course  Procedures (including critical care time)  DIAGNOSTIC STUDIES: Oxygen Saturation is 98% on room air, normal by my interpretation.    COORDINATION OF CARE: 1:38 PM Discussed treatment plan with pt at bedside and pt agreed to plan.   Labs Review Labs Reviewed  CBG MONITORING, ED - Abnormal; Notable for the following:    Glucose-Capillary 209 (*)    All other components within normal limits    Imaging Review No results found.   EKG Interpretation   Date/Time:  Wednesday November 17 2014 11:56:36 EDT Ventricular Rate:  89 PR Interval:  148 QRS Duration: 86 QT Interval:  376 QTC Calculation: 457 R Axis:   49 Text Interpretation:  Sinus rhythm Low voltage, precordial leads NO  SIGNIFICANT CHANGE SINCE LAST TRACING YESTERDAY Confirmed by Wilson Singer  MD,  STEPHEN ([id number]) on 11/17/2014 11:59:15 AM      MDM    Final diagnoses:  Arthritis    Patients xrays show that she has slight arthritic changes to the right hand and NO boney  Abnormality to the left shoulder. I personally performed the services described in this documentation, which was scribed in my presence. The recorded information has been reviewed and is accurate. She has been given a wrist splint in the ED. Will give a referral to Ortho and treat symptomatically. Pt pain is MSK and denies having any CP, SOB, DOE, or fatigue- symptoms are exacerbated by palpation and ROM.  Medications - No data to display  59 y.o.Sarah [name]'s evaluation in the Emergency Department is complete. It has been determined that no acute conditions requiring further emergency intervention are present at this time. The patient/guardian have been advised of the diagnosis and plan. We have discussed signs and symptoms that warrant return to the ED, such as changes or worsening in symptoms.  Vital signs are stable at discharge. Filed Vitals:   11/17/14 1355  BP: 133/72  Pulse: 82  Temp:   Resp: 18    Patient/guardian has voiced understanding and agreed to follow-up with the PCP or specialist.  I personally performed the services described in this documentation, which was scribed in my presence. The recorded information has been reviewed and is accurate.   Delos Haring, PA-C 11/19/14 [place], MD 11/22/14 [phone]

## 2014-11-17 NOTE — Discharge Instructions (Signed)
Shoulder Sprain A shoulder sprain is the result of damage to the tough, fiber-like tissues (ligaments) that help hold your shoulder in place. The ligaments may be stretched or torn. Besides the main shoulder joint (the ball and socket), there are several smaller joints that connect the bones in this area. A sprain usually involves one of those joints. Most often it is the acromioclavicular (or AC) joint. That is the joint that connects the collarbone (clavicle) and the shoulder blade (scapula) at the top point of the shoulder blade (acromion). A shoulder sprain is a mild form of what is called a shoulder separation. Recovering from a shoulder sprain may take some time. For some, pain lingers for several months. Most people recover without long term problems. CAUSES   A shoulder sprain is usually caused by some kind of trauma. This might be:  Falling on an outstretched arm.  Being hit hard on the shoulder.  Twisting the arm.  Shoulder sprains are more likely to occur in people who:  Play sports.  Have balance or coordination problems. SYMPTOMS   Pain when you move your shoulder.  Limited ability to move the shoulder.  Swelling and tenderness on top of the shoulder.  Redness or warmth in the shoulder.  Bruising.  A change in the shape of the shoulder. DIAGNOSIS  Your healthcare provider may:  Ask about your symptoms.  Ask about recent activity that might have caused those symptoms.  Examine your shoulder. You may be asked to do simple exercises to test movement. The other shoulder will be examined for comparison.  Order some tests that provide a look inside the body. They can show the extent of the injury. The tests could include:  X-rays.  CT (computed tomography) scan.  MRI (magnetic resonance imaging) scan. RISKS AND COMPLICATIONS  Loss of full shoulder motion.  Ongoing shoulder pain. TREATMENT  How long it takes to recover from a shoulder sprain depends on how  severe it was. Treatment options may include:  Rest. You should not use the arm or shoulder until it heals.  Ice. For 2 or 3 days after the injury, put an ice pack on the shoulder up to 4 times a day. It should stay on for 15 to 20 minutes each time. Wrap the ice in a towel so it does not touch your skin.  Over-the-counter medicine to relieve pain.  A sling or brace. This will keep the arm still while the shoulder is healing.  Physical therapy or rehabilitation exercises. These will help you regain strength and motion. Ask your healthcare provider when it is OK to begin these exercises.  Surgery. The need for surgery is rare with a sprained shoulder, but some people may need surgery to keep the joint in place and reduce pain. HOME CARE INSTRUCTIONS   Ask your healthcare provider about what you should and should not do while your shoulder heals.  Make sure you know how to apply ice to the correct area of your shoulder.  Talk with your healthcare provider about which medications should be used for pain and swelling.  If rehabilitation therapy will be needed, ask your healthcare provider to refer you to a therapist. If it is not recommended, then ask about at-home exercises. Find out when exercise should begin. SEEK MEDICAL CARE IF:  Your pain, swelling, or redness at the joint increases. SEEK IMMEDIATE MEDICAL CARE IF:   You have a fever.  You cannot move your arm or shoulder. Document Released: 10/21/2008 Document  Revised: 08/27/2011 Document Reviewed: 10/21/2008 [employer] Patient Information 2015 [place], [place]. This information is not intended to replace advice given to you by your health care provider. Make sure you discuss any questions you have with your health care provider. Carpal Tunnel Syndrome The carpal tunnel is a narrow area located on the palm side of your wrist. The tunnel is formed by the wrist bones and ligaments. Nerves, blood vessels, and tendons pass through the  carpal tunnel. Repeated wrist motion or certain diseases may cause swelling within the tunnel. This swelling pinches the main nerve in the wrist (median nerve) and causes the painful hand and arm condition called carpal tunnel syndrome. CAUSES   Repeated wrist motions.  Wrist injuries.  Certain diseases like arthritis, diabetes, alcoholism, hyperthyroidism, and kidney failure.  Obesity.  Pregnancy. SYMPTOMS   A "pins and needles" feeling in your fingers or hand, especially in your thumb, index and middle fingers.  Tingling or numbness in your fingers or hand.  An aching feeling in your entire arm, especially when your wrist and elbow are bent for long periods of time.  Wrist pain that goes up your arm to your shoulder.  Pain that goes down into your palm or fingers.  A weak feeling in your hands. DIAGNOSIS  Your health care provider will take your history and perform a physical exam. An electromyography test may be needed. This test measures electrical signals sent out by your nerves into the muscles. The electrical signals are usually slowed by carpal tunnel syndrome. You may also need X-rays. TREATMENT  Carpal tunnel syndrome may clear up by itself. Your health care provider may recommend a wrist splint or medicine such as a nonsteroidal anti-inflammatory medicine. Cortisone injections may help. Sometimes, surgery may be needed to free the pinched nerve.  HOME CARE INSTRUCTIONS   Take all medicine as directed by your health care provider. Only take over-the-counter or prescription medicines for pain, discomfort, or fever as directed by your health care provider.  If you were given a splint to keep your wrist from bending, wear it as directed. It is important to wear the splint at night. Wear the splint for as long as you have pain or numbness in your hand, arm, or wrist. This may take 1 to 2 months.  Rest your wrist from any activity that may be causing your pain. If your  symptoms are work-related, you may need to talk to your employer about changing to a job that does not require using your wrist.  Put ice on your wrist after long periods of wrist activity.  Put ice in a plastic bag.  Place a towel between your skin and the bag.  Leave the ice on for 15-20 minutes, 03-04 times a day.  Keep all follow-up visits as directed by your health care provider. This includes any orthopedic referrals, physical therapy, and rehabilitation. Any delay in getting necessary care could result in a delay or failure of your condition to heal. SEEK IMMEDIATE MEDICAL CARE IF:   You have new, unexplained symptoms.  Your symptoms get worse and are not helped or controlled with medicines. MAKE SURE YOU:   Understand these instructions.  Will watch your condition.  Will get help right away if you are not doing well or get worse. Document Released: 06/01/2000 Document Revised: 10/19/2013 Document Reviewed: 04/20/2011 [facility] Patient Information 2015 [place], [place]. This information is not intended to replace advice given to you by your health care provider. Make sure you discuss any  questions you have with your health care provider.

## 2014-11-17 NOTE — ED Notes (Signed)
Pt c/o left shoulder pain that radiates to her left elbow that started on Saturday.  Pt states that she mowed the lawn a little bit but had to keep stopping bc of pain in her hand.  Pt states that she also has pain in right hand and was told she has carpal tunnel in it.  Pt states that she is sore when she puts deodorant in her right axilla.

## 2014-11-19 ENCOUNTER — Ambulatory Visit: Payer: Medicare Other | Admitting: Internal Medicine

## 2014-11-22 ENCOUNTER — Ambulatory Visit: Payer: Medicare Other | Attending: Internal Medicine | Admitting: Internal Medicine

## 2014-11-22 ENCOUNTER — Encounter: Payer: Self-pay | Admitting: Internal Medicine

## 2014-11-22 VITALS — BP 140/70 | HR 93 | Temp 98.0°F | Resp 16 | Wt 212.0 lb

## 2014-11-22 DIAGNOSIS — Z90710 Acquired absence of both cervix and uterus: Secondary | ICD-10-CM | POA: Diagnosis not present

## 2014-11-22 DIAGNOSIS — E139 Other specified diabetes mellitus without complications: Secondary | ICD-10-CM | POA: Diagnosis not present

## 2014-11-22 DIAGNOSIS — M19019 Primary osteoarthritis, unspecified shoulder: Secondary | ICD-10-CM | POA: Insufficient documentation

## 2014-11-22 DIAGNOSIS — F17210 Nicotine dependence, cigarettes, uncomplicated: Secondary | ICD-10-CM | POA: Diagnosis not present

## 2014-11-22 DIAGNOSIS — J45909 Unspecified asthma, uncomplicated: Secondary | ICD-10-CM | POA: Insufficient documentation

## 2014-11-22 DIAGNOSIS — K219 Gastro-esophageal reflux disease without esophagitis: Secondary | ICD-10-CM

## 2014-11-22 DIAGNOSIS — Z791 Long term (current) use of non-steroidal anti-inflammatories (NSAID): Secondary | ICD-10-CM | POA: Diagnosis not present

## 2014-11-22 DIAGNOSIS — M19039 Primary osteoarthritis, unspecified wrist: Secondary | ICD-10-CM | POA: Diagnosis not present

## 2014-11-22 DIAGNOSIS — Z79899 Other long term (current) drug therapy: Secondary | ICD-10-CM | POA: Insufficient documentation

## 2014-11-22 DIAGNOSIS — Z1231 Encounter for screening mammogram for malignant neoplasm of breast: Secondary | ICD-10-CM

## 2014-11-22 DIAGNOSIS — E119 Type 2 diabetes mellitus without complications: Secondary | ICD-10-CM | POA: Insufficient documentation

## 2014-11-22 DIAGNOSIS — Z86718 Personal history of other venous thrombosis and embolism: Secondary | ICD-10-CM | POA: Diagnosis not present

## 2014-11-22 DIAGNOSIS — Z720 Tobacco use: Secondary | ICD-10-CM

## 2014-11-22 DIAGNOSIS — F17200 Nicotine dependence, unspecified, uncomplicated: Secondary | ICD-10-CM

## 2014-11-22 DIAGNOSIS — H538 Other visual disturbances: Secondary | ICD-10-CM | POA: Diagnosis not present

## 2014-11-22 DIAGNOSIS — M1990 Unspecified osteoarthritis, unspecified site: Secondary | ICD-10-CM

## 2014-11-22 DIAGNOSIS — I10 Essential (primary) hypertension: Secondary | ICD-10-CM | POA: Diagnosis not present

## 2014-11-22 LAB — POCT GLYCOSYLATED HEMOGLOBIN (HGB A1C): Hemoglobin A1C: 5.8

## 2014-11-22 LAB — GLUCOSE, POCT (MANUAL RESULT ENTRY): POC GLUCOSE: 158 mg/dL — AB (ref 70–99)

## 2014-11-22 MED ORDER — AMLODIPINE BESYLATE 10 MG PO TABS: 10.0000 mg | ORAL_TABLET | Freq: Every day | ORAL | Status: DC

## 2014-11-22 MED ORDER — NICOTINE 21 MG/24HR TD PT24: 21.0000 mg | MEDICATED_PATCH | Freq: Every day | TRANSDERMAL | Status: DC

## 2014-11-22 MED ORDER — PANTOPRAZOLE SODIUM 20 MG PO TBEC: 20.0000 mg | DELAYED_RELEASE_TABLET | Freq: Every day | ORAL | Status: DC

## 2014-11-22 MED ORDER — LISINOPRIL 5 MG PO TABS: 5.0000 mg | ORAL_TABLET | Freq: Every morning | ORAL | Status: DC

## 2014-11-22 MED ORDER — GLIMEPIRIDE 1 MG PO TABS: 1.0000 mg | ORAL_TABLET | Freq: Every day | ORAL | Status: DC

## 2014-11-22 MED ORDER — HYDROCHLOROTHIAZIDE 50 MG PO TABS: 50.0000 mg | ORAL_TABLET | Freq: Every day | ORAL | Status: DC

## 2014-11-22 MED ORDER — METFORMIN HCL 500 MG PO TABS: 500.0000 mg | ORAL_TABLET | Freq: Every day | ORAL | Status: DC

## 2014-11-22 NOTE — Progress Notes (Signed)
Patient here for follow up on her diabetes and HTN Patient also requesting refills on her medications Patient complains of bilateral hand and wrist pain as well

## 2014-11-22 NOTE — Progress Notes (Signed)
MRN: [id number] Name: Sarah [name]  Sex: female Age: 59 y.o. DOB: [birth year]  Allergies: Aspirin; Ibuprofen; and Penicillins  Chief Complaint  Patient presents with  . Follow-up    HPI: Patient is 59 y.o. female who history of diabetes hypertension, GERD tobacco abuse, recently went to the emergency room with symptoms of wrist  pain shoulder pain, EMR reviewed patient had x-ray done which reported arthritis, patient was given with the splint and was advised to follow with orthopedics as per patient she already has scheduled appointment, for diabetes she has been taking Amaryl and metformin, on the last visit she was advised to reduce the dose of metformin to daily which she is taking occasionally her blood sugar drops to 70s, I have counseled patient regarding hypoglycemia and if it is more frequent she will discontinue Amaryl and only continue with metformin once daily.  Past Medical History  Diagnosis Date  . Hypertension   . Asthma   . Gastritis   . Bacterial vaginosis   . GERD (gastroesophageal reflux disease)   . Diabetes type 2, uncontrolled     01/04/13- pt denies having diabetes  . Abdominal wall hernia 01/05/2013  . Pneumonia   . Diabetes mellitus without complication   . Headache   . Arthritis   . Blood clot in vein 2000    left knee after surgery    Past Surgical History  Procedure Laterality Date  . Left knee surgery Left 2000    arthroscopy and then blood clot removed  . Abdominal hysterectomy  1995    complete  . Dilation and curettage of uterus    . Ventral hernia repair N/A 07/20/2014    Procedure: LAPAROSCOPIC REPAIR INCARCARATED VENTRAL HERNIA AND UMBILICAL HERNIA;  Surgeon: Fanny Skates, MD;  Location: [facility];  Service: General;  Laterality: N/A;  . Insertion of mesh N/A 07/20/2014    Procedure: INSERTION OF MESH;  Surgeon: Fanny Skates, MD;  Location: [facility];  Service: General;  Laterality: N/A;      Medication List       This list is accurate  as of: 11/22/14 12:09 PM.  Always use your most recent med list.               albuterol 108 (90 BASE) MCG/ACT inhaler  Commonly known as:  PROVENTIL HFA;VENTOLIN HFA  Inhale 2 puffs into the lungs every 6 (six) hours as needed for wheezing.     allopurinol 100 MG tablet  Commonly known as:  ZYLOPRIM  Take 1 tablet (100 mg total) by mouth daily.     amLODipine 10 MG tablet  Commonly known as:  NORVASC  Take 1 tablet (10 mg total) by mouth daily.     azithromycin 250 MG tablet  Commonly known as:  ZITHROMAX Z-PAK  2 po day one, then 1 daily x 4 days     blood glucose meter kit and supplies Kit  Check blood sugar TID & QHS     glimepiride 1 MG tablet  Commonly known as:  AMARYL  Take 1 tablet (1 mg total) by mouth daily before breakfast.     GlucoCom Lancets Misc  Check blood sugar TID & QHS     glucose blood test strip  Commonly known as:  CHOICE DM FORA G20 TEST STRIPS  Check blood sugar TID & QHS     hydrochlorothiazide 50 MG tablet  Commonly known as:  HYDRODIURIL  Take 1 tablet (50 mg total) by mouth daily.  HYDROcodone-acetaminophen 5-325 MG per tablet  Commonly known as:  NORCO/VICODIN  Take 2 tablets by mouth every 6 (six) hours as needed for moderate pain or severe pain (pain).     HYDROcodone-acetaminophen 5-325 MG per tablet  Commonly known as:  NORCO/VICODIN  Take 1 tablet by mouth every 4 (four) hours as needed for moderate pain.     HYDROcodone-acetaminophen 5-325 MG per tablet  Commonly known as:  NORCO/VICODIN  Take 1 tablet by mouth every 6 (six) hours as needed.     lisinopril 5 MG tablet  Commonly known as:  PRINIVIL,ZESTRIL  Take 1 tablet (5 mg total) by mouth every morning.     meclizine 25 MG tablet  Commonly known as:  ANTIVERT  Take 1 tablet (25 mg total) by mouth 3 (three) times daily as needed for dizziness.     meloxicam 7.5 MG tablet  Commonly known as:  MOBIC  Take 1 tablet (7.5 mg total) by mouth daily.     metFORMIN 500 MG  tablet  Commonly known as:  GLUCOPHAGE  Take 1 tablet (500 mg total) by mouth daily with breakfast.     naproxen 500 MG tablet  Commonly known as:  NAPROSYN  Take 1 tablet (500 mg total) by mouth 2 (two) times daily.     nicotine 21 mg/24hr patch  Commonly known as:  NICODERM CQ  Place 1 patch (21 mg total) onto the skin daily.     ofloxacin 0.3 % ophthalmic solution  Commonly known as:  OCUFLOX  Place 1 drop into both eyes 4 (four) times daily.     pantoprazole 20 MG tablet  Commonly known as:  PROTONIX  Take 1 tablet (20 mg total) by mouth daily.     pioglitazone 15 MG tablet  Commonly known as:  ACTOS  Take 15 mg by mouth daily.     sucralfate 1 G tablet  Commonly known as:  CARAFATE  Take 1 tablet (1 g total) by mouth 4 (four) times daily -  with meals and at bedtime.        Meds ordered this encounter  Medications  . amLODipine (NORVASC) 10 MG tablet    Sig: Take 1 tablet (10 mg total) by mouth daily.    Dispense:  30 tablet    Refill:  3  . glimepiride (AMARYL) 1 MG tablet    Sig: Take 1 tablet (1 mg total) by mouth daily before breakfast.    Dispense:  30 tablet    Refill:  3  . hydrochlorothiazide (HYDRODIURIL) 50 MG tablet    Sig: Take 1 tablet (50 mg total) by mouth daily.    Dispense:  30 tablet    Refill:  3  . lisinopril (PRINIVIL,ZESTRIL) 5 MG tablet    Sig: Take 1 tablet (5 mg total) by mouth every morning.    Dispense:  30 tablet    Refill:  2  . metFORMIN (GLUCOPHAGE) 500 MG tablet    Sig: Take 1 tablet (500 mg total) by mouth daily with breakfast.    Dispense:  90 tablet    Refill:  3  . pantoprazole (PROTONIX) 20 MG tablet    Sig: Take 1 tablet (20 mg total) by mouth daily.    Dispense:  30 tablet    Refill:  2  . nicotine (NICODERM CQ) 21 mg/24hr patch    Sig: Place 1 patch (21 mg total) onto the skin daily.    Dispense:  28 patch  Refill:  1    Immunization History  Administered Date(s) Administered  . Influenza-Unspecified  04/18/2013    History reviewed. No pertinent family history.  History  Substance Use Topics  . Smoking status: Current Every Day Smoker -- 1.50 packs/day for 40 years    Types: Cigarettes  . Smokeless tobacco: Never Used  . Alcohol Use: No    Review of Systems   As noted in HPI  Filed Vitals:   11/22/14 1149  BP: 140/70  Pulse:   Temp:   Resp:     Physical Exam  Physical Exam  Constitutional: No distress.  Eyes: EOM are normal. Pupils are equal, round, and reactive to light.  Cardiovascular: Normal rate and regular rhythm.   Pulmonary/Chest: Breath sounds normal. No respiratory distress. She has no wheezes. She has no rales.  Musculoskeletal:  Patient is wearing wrist brace    CBC    Component Value Date/Time   WBC 6.2 07/25/2014 0613   RBC 4.26 07/25/2014 0613   HGB 11.5* 07/25/2014 0613   HCT 35.2* 07/25/2014 0613   PLT 247 07/25/2014 0613   MCV 82.6 07/25/2014 0613   LYMPHSABS 3.1 07/06/2014 1405   MONOABS 0.4 07/06/2014 1405   EOSABS 0.1 07/06/2014 1405   BASOSABS 0.0 07/06/2014 1405    CMP     Component Value Date/Time   NA 135 08/11/2014 1544   K 4.2 08/11/2014 1544   CL 96 08/11/2014 1544   CO2 29 08/11/2014 1544   GLUCOSE 96 08/11/2014 1544   BUN 9 08/11/2014 1544   CREATININE 0.69 08/11/2014 1544   CREATININE 0.71 07/27/2014 0430   CALCIUM 9.9 08/11/2014 1544   PROT 7.9 08/11/2014 1544   ALBUMIN 4.2 08/11/2014 1544   AST 9 08/11/2014 1544   ALT <8 08/11/2014 1544   ALKPHOS 81 08/11/2014 1544   BILITOT 0.4 08/11/2014 1544   GFRNONAA >89 08/11/2014 1544   GFRNONAA >90 07/27/2014 0430   GFRAA >89 08/11/2014 1544   GFRAA >90 07/27/2014 0430    Lab Results  Component Value Date/Time   CHOL 176 01/05/2013 10:41 AM    Lab Results  Component Value Date/Time   HGBA1C 5.80 11/22/2014 11:11 AM   HGBA1C 6.3* 07/23/2014 04:55 AM    Lab Results  Component Value Date/Time   AST 9 08/11/2014 03:44 PM    Assessment and Plan  Other  specified diabetes mellitus without complications - Plan:  Results for orders placed or performed in visit on 11/22/14  Glucose (CBG)  Result Value Ref Range   POC Glucose 158.0 (A) 70 - 99 mg/dl  HgB A1c  Result Value Ref Range   Hemoglobin A1C 5.80    Patient is advised regarding hypoglycemia, she's currently on Amaryl and metformin 500 mg daily, I have advised patient to discontinue Amaryl if she has frequent hypoglycemic, she understand and verbalized instructions. HgB A1c, glimepiride (AMARYL) 1 MG tablet, metFORMIN (GLUCOPHAGE) 500 MG tablet, Ambulatory referral to Podiatry, Ambulatory referral to Ophthalmology  Essential hypertension, benign - Plan: advise patient for DASH diet, patient has not taken her blood pressure medication today, continue withamLODipine (NORVASC) 10 MG tablet, hydrochlorothiazide (HYDRODIURIL) 50 MG tablet, lisinopril (PRINIVIL,ZESTRIL) 5 MG tablet  Arthritis Patient to follow with orthopedics  Gastroesophageal reflux disease, esophagitis presence not specified - Plan: Modification, continue with pantoprazole (PROTONIX) 20 MG tablet  Tobacco abuse - Plan: nicotine (NICODERM CQ) 21 mg/24hr patch  Blurry vision - Plan: Ambulatory referral to Ophthalmology  Encounter for screening mammogram for breast  cancer - Plan: MM DIGITAL SCREENING BILATERAL   Health Maintenance -Colonoscopy:I have discussed with the patient regarding colonoscopy, patient is reluctant, she will think about it -Mammogram:ordered   Return in about 3 months (around 02/22/2015), or if symptoms worsen or fail to improve, for diabetes.   This note has been created with Surveyor, quantity. Any transcriptional errors are unintentional.    Lorayne Marek, MD

## 2014-11-22 NOTE — Patient Instructions (Signed)
Diabetes Mellitus and Food It is important for you to manage your blood sugar (glucose) level. Your blood glucose level can be greatly affected by what you eat. Eating healthier foods in the appropriate amounts throughout the day at about the same time each day will help you control your blood glucose level. It can also help slow or prevent worsening of your diabetes mellitus. Healthy eating may even help you improve the level of your blood pressure and reach or maintain a healthy weight.  HOW CAN FOOD AFFECT ME? Carbohydrates Carbohydrates affect your blood glucose level more than any other type of food. Your dietitian will help you determine how many carbohydrates to eat at each meal and teach you how to count carbohydrates. Counting carbohydrates is important to keep your blood glucose at a healthy level, especially if you are using insulin or taking certain medicines for diabetes mellitus. Alcohol Alcohol can cause sudden decreases in blood glucose (hypoglycemia), especially if you use insulin or take certain medicines for diabetes mellitus. Hypoglycemia can be a life-threatening condition. Symptoms of hypoglycemia (sleepiness, dizziness, and disorientation) are similar to symptoms of having too much alcohol.  If your health care provider has given you approval to drink alcohol, do so in moderation and use the following guidelines:  Women should not have more than one drink per day, and men should not have more than two drinks per day. One drink is equal to:  12 oz of beer.  5 oz of wine.  1 oz of hard liquor.  Do not drink on an empty stomach.  Keep yourself hydrated. Have water, diet soda, or unsweetened iced tea.  Regular soda, juice, and other mixers might contain a lot of carbohydrates and should be counted. WHAT FOODS ARE NOT RECOMMENDED? As you make food choices, it is important to remember that all foods are not the same. Some foods have fewer nutrients per serving than other  foods, even though they might have the same number of calories or carbohydrates. It is difficult to get your body what it needs when you eat foods with fewer nutrients. Examples of foods that you should avoid that are high in calories and carbohydrates but low in nutrients include:  Trans fats (most processed foods list trans fats on the Nutrition Facts label).  Regular soda.  Juice.  Candy.  Sweets, such as cake, pie, doughnuts, and cookies.  Fried foods. WHAT FOODS CAN I EAT? Have nutrient-rich foods, which will nourish your body and keep you healthy. The food you should eat also will depend on several factors, including:  The calories you need.  The medicines you take.  Your weight.  Your blood glucose level.  Your blood pressure level.  Your cholesterol level. You also should eat a variety of foods, including:  Protein, such as meat, poultry, fish, tofu, nuts, and seeds (lean animal proteins are best).  Fruits.  Vegetables.  Dairy products, such as milk, cheese, and yogurt (low fat is best).  Breads, grains, pasta, cereal, rice, and beans.  Fats such as olive oil, trans fat-free margarine, canola oil, avocado, and olives. DOES EVERYONE WITH DIABETES MELLITUS HAVE THE SAME MEAL PLAN? Because every person with diabetes mellitus is different, there is not one meal plan that works for everyone. It is very important that you meet with a dietitian who will help you create a meal plan that is just right for you. Document Released: 03/01/2005 Document Revised: 06/09/2013 Document Reviewed: 05/01/2013 Exit[employer] Patient Information 2015 [employer]. This   information is not intended to replace advice given to you by your health care provider. Make sure you discuss any questions you have with your health care provider. DASH Eating Plan DASH stands for "Dietary Approaches to Stop Hypertension." The DASH eating plan is a healthy eating plan that has been shown to reduce high  blood pressure (hypertension). Additional health benefits may include reducing the risk of type 2 diabetes mellitus, heart disease, and stroke. The DASH eating plan may also help with weight loss. WHAT DO I NEED TO KNOW ABOUT THE DASH EATING PLAN? For the DASH eating plan, you will follow these general guidelines:  Choose foods with a percent daily value for sodium of less than 5% (as listed on the food label).  Use salt-free seasonings or herbs instead of table salt or sea salt.  Check with your health care provider or pharmacist before using salt substitutes.  Eat lower-sodium products, often labeled as "lower sodium" or "no salt added."  Eat fresh foods.  Eat more vegetables, fruits, and low-fat dairy products.  Choose whole grains. Look for the word "whole" as the first word in the ingredient list.  Choose fish and skinless chicken or turkey more often than red meat. Limit fish, poultry, and meat to 6 oz (170 g) each day.  Limit sweets, desserts, sugars, and sugary drinks.  Choose heart-healthy fats.  Limit cheese to 1 oz (28 g) per day.  Eat more home-cooked food and less restaurant, buffet, and fast food.  Limit fried foods.  Cook foods using methods other than frying.  Limit canned vegetables. If you do use them, rinse them well to decrease the sodium.  When eating at a restaurant, ask that your food be prepared with less salt, or no salt if possible. WHAT FOODS CAN I EAT? Seek help from a dietitian for individual calorie needs. Grains Whole grain or whole wheat bread. Brown rice. Whole grain or whole wheat pasta. Quinoa, bulgur, and whole grain cereals. Low-sodium cereals. Corn or whole wheat flour tortillas. Whole grain cornbread. Whole grain crackers. Low-sodium crackers. Vegetables Fresh or frozen vegetables (raw, steamed, roasted, or grilled). Low-sodium or reduced-sodium tomato and vegetable juices. Low-sodium or reduced-sodium tomato sauce and paste. Low-sodium  or reduced-sodium canned vegetables.  Fruits All fresh, canned (in natural juice), or frozen fruits. Meat and Other Protein Products Ground beef (85% or leaner), grass-fed beef, or beef trimmed of fat. Skinless chicken or turkey. Ground chicken or turkey. Pork trimmed of fat. All fish and seafood. Eggs. Dried beans, peas, or lentils. Unsalted nuts and seeds. Unsalted canned beans. Dairy Low-fat dairy products, such as skim or 1% milk, 2% or reduced-fat cheeses, low-fat ricotta or cottage cheese, or plain low-fat yogurt. Low-sodium or reduced-sodium cheeses. Fats and Oils Tub margarines without trans fats. Light or reduced-fat mayonnaise and salad dressings (reduced sodium). Avocado. Safflower, olive, or canola oils. Natural peanut or almond butter. Other Unsalted popcorn and pretzels. The items listed above may not be a complete list of recommended foods or beverages. Contact your dietitian for more options. WHAT FOODS ARE NOT RECOMMENDED? Grains White bread. White pasta. White rice. Refined cornbread. Bagels and croissants. Crackers that contain trans fat. Vegetables Creamed or fried vegetables. Vegetables in a cheese sauce. Regular canned vegetables. Regular canned tomato sauce and paste. Regular tomato and vegetable juices. Fruits Dried fruits. Canned fruit in light or heavy syrup. Fruit juice. Meat and Other Protein Products Fatty cuts of meat. Ribs, chicken wings, bacon, sausage, bologna, salami, chitterlings, fatback, hot   dogs, bratwurst, and packaged luncheon meats. Salted nuts and seeds. Canned beans with salt. Dairy Whole or 2% milk, cream, half-and-half, and cream cheese. Whole-fat or sweetened yogurt. Full-fat cheeses or blue cheese. Nondairy creamers and whipped toppings. Processed cheese, cheese spreads, or cheese curds. Condiments Onion and garlic salt, seasoned salt, table salt, and sea salt. Canned and packaged gravies. Worcestershire sauce. Tartar sauce. Barbecue sauce.  Teriyaki sauce. Soy sauce, including reduced sodium. Steak sauce. Fish sauce. Oyster sauce. Cocktail sauce. Horseradish. Ketchup and mustard. Meat flavorings and tenderizers. Bouillon cubes. Hot sauce. Tabasco sauce. Marinades. Taco seasonings. Relishes. Fats and Oils Butter, stick margarine, lard, shortening, ghee, and bacon fat. Coconut, palm kernel, or palm oils. Regular salad dressings. Other Pickles and olives. Salted popcorn and pretzels. The items listed above may not be a complete list of foods and beverages to avoid. Contact your dietitian for more information. WHERE CAN I FIND MORE INFORMATION? National [employer], [employer], [employer]: www.nhlbi.nih.gov/health/health-topics/topics/dash/ Document Released: 05/24/2011 Document Revised: 10/19/2013 Document Reviewed: 04/08/2013 ExitCare Patient Information 2015 [employer]. This information is not intended to replace advice given to you by your health care provider. Make sure you discuss any questions you have with your health care provider.  

## 2014-12-16 ENCOUNTER — Ambulatory Visit: Payer: Self-pay | Admitting: Podiatry

## 2015-01-04 ENCOUNTER — Ambulatory Visit: Payer: Self-pay | Admitting: Podiatry

## 2015-01-13 ENCOUNTER — Emergency Department (HOSPITAL_COMMUNITY): Payer: Medicare Other

## 2015-01-13 ENCOUNTER — Emergency Department (HOSPITAL_COMMUNITY)
Admission: EM | Admit: 2015-01-13 | Discharge: 2015-01-13 | Disposition: A | Discharge status: Home / Self Care | Payer: Medicare Other | Attending: Emergency Medicine | Admitting: Emergency Medicine

## 2015-01-13 ENCOUNTER — Encounter (HOSPITAL_COMMUNITY): Payer: Self-pay

## 2015-01-13 DIAGNOSIS — Z8742 Personal history of other diseases of the female genital tract: Secondary | ICD-10-CM | POA: Diagnosis not present

## 2015-01-13 DIAGNOSIS — R109 Unspecified abdominal pain: Secondary | ICD-10-CM | POA: Diagnosis present

## 2015-01-13 DIAGNOSIS — K219 Gastro-esophageal reflux disease without esophagitis: Secondary | ICD-10-CM | POA: Diagnosis not present

## 2015-01-13 DIAGNOSIS — M1990 Unspecified osteoarthritis, unspecified site: Secondary | ICD-10-CM | POA: Diagnosis not present

## 2015-01-13 DIAGNOSIS — J45909 Unspecified asthma, uncomplicated: Secondary | ICD-10-CM | POA: Diagnosis not present

## 2015-01-13 DIAGNOSIS — Z86718 Personal history of other venous thrombosis and embolism: Secondary | ICD-10-CM | POA: Diagnosis not present

## 2015-01-13 DIAGNOSIS — E119 Type 2 diabetes mellitus without complications: Secondary | ICD-10-CM | POA: Insufficient documentation

## 2015-01-13 DIAGNOSIS — K2970 Gastritis, unspecified, without bleeding: Secondary | ICD-10-CM | POA: Diagnosis not present

## 2015-01-13 DIAGNOSIS — R079 Chest pain, unspecified: Secondary | ICD-10-CM | POA: Insufficient documentation

## 2015-01-13 DIAGNOSIS — K5900 Constipation, unspecified: Secondary | ICD-10-CM | POA: Diagnosis not present

## 2015-01-13 DIAGNOSIS — Z79899 Other long term (current) drug therapy: Secondary | ICD-10-CM | POA: Diagnosis not present

## 2015-01-13 DIAGNOSIS — K859 Acute pancreatitis, unspecified: Secondary | ICD-10-CM | POA: Diagnosis not present

## 2015-01-13 DIAGNOSIS — I10 Essential (primary) hypertension: Secondary | ICD-10-CM | POA: Diagnosis not present

## 2015-01-13 DIAGNOSIS — Z720 Tobacco use: Secondary | ICD-10-CM | POA: Insufficient documentation

## 2015-01-13 DIAGNOSIS — Z90710 Acquired absence of both cervix and uterus: Secondary | ICD-10-CM | POA: Insufficient documentation

## 2015-01-13 DIAGNOSIS — Z880 Allergy status to penicillin: Secondary | ICD-10-CM | POA: Diagnosis not present

## 2015-01-13 DIAGNOSIS — Z8701 Personal history of pneumonia (recurrent): Secondary | ICD-10-CM | POA: Diagnosis not present

## 2015-01-13 LAB — COMPREHENSIVE METABOLIC PANEL
ALT: 10 U/L — AB (ref 14–54)
ANION GAP: 7 (ref 5–15)
AST: 14 U/L — ABNORMAL LOW (ref 15–41)
Albumin: 4.2 g/dL (ref 3.5–5.0)
Alkaline Phosphatase: 104 U/L (ref 38–126)
BUN: 12 mg/dL (ref 6–20)
CO2: 29 mmol/L (ref 22–32)
Calcium: 9.4 mg/dL (ref 8.9–10.3)
Chloride: 101 mmol/L (ref 101–111)
Creatinine, Ser: 0.77 mg/dL (ref 0.44–1.00)
GLUCOSE: 185 mg/dL — AB (ref 65–99)
Potassium: 3.4 mmol/L — ABNORMAL LOW (ref 3.5–5.1)
SODIUM: 137 mmol/L (ref 135–145)
TOTAL PROTEIN: 8.1 g/dL (ref 6.5–8.1)
Total Bilirubin: 0.6 mg/dL (ref 0.3–1.2)

## 2015-01-13 LAB — LIPASE, BLOOD: Lipase: 121 U/L — ABNORMAL HIGH (ref 22–51)

## 2015-01-13 LAB — I-STAT TROPONIN, ED
Troponin i, poc: 0.02 ng/mL (ref 0.00–0.08)
Troponin i, poc: 0 ng/mL (ref 0.00–0.08)

## 2015-01-13 LAB — CBC
HEMATOCRIT: 42.8 % (ref 36.0–46.0)
Hemoglobin: 14.6 g/dL (ref 12.0–15.0)
MCH: 26.9 pg (ref 26.0–34.0)
MCHC: 34.1 g/dL (ref 30.0–36.0)
MCV: 79 fL (ref 78.0–100.0)
PLATELETS: 255 10*3/uL (ref 150–400)
RBC: 5.42 MIL/uL — AB (ref 3.87–5.11)
RDW: 15.1 % (ref 11.5–15.5)
WBC: 5.9 10*3/uL (ref 4.0–10.5)

## 2015-01-13 MED ORDER — GI COCKTAIL ~~LOC~~
30.0000 mL | Freq: Once | ORAL | Status: AC
  Administered 2015-01-13: 30 mL via ORAL
  Filled 2015-01-13: qty 30

## 2015-01-13 MED ORDER — SUCRALFATE 1 G PO TABS: 1.0000 g | ORAL_TABLET | Freq: Three times a day (TID) | ORAL | Status: DC

## 2015-01-13 MED ORDER — DOCUSATE SODIUM 100 MG PO CAPS: 100.0000 mg | ORAL_CAPSULE | Freq: Two times a day (BID) | ORAL | Status: DC

## 2015-01-13 NOTE — ED Provider Notes (Signed)
CSN: [id number]     Arrival date & time 01/13/15  1725 History   First MD Initiated Contact with Patient 01/13/15 2116     Chief Complaint  Patient presents with  . Abdominal Pain  . Constipation  . Chest Pain     (Consider location/radiation/quality/duration/timing/severity/associated sxs/prior Treatment) HPI Comments: Patient history of diabetes hypertension, gastritis and pancreatitis presents with upper abdominal pain. She states for about a week she's had some pain across her upper abdomen. She's had a little nausea but no vomiting. She states over the last 2 days the pain occasionally shoots up into her chest and her left arm. She states this feels like a lightening bolt and only last for a few seconds. She denies any shortness of breath other than her baseline shortness of breath. She denies any leg pain or swelling. She denies any blood in her stool. She does report some constipation and hasn't had a bowel movement in about a week. She states she normally has a bowel movement every 2-3 days. She's been taking some milk of magnesia for symptomatic relief without improvement. Her past surgical history includes ventral hernia repair and hysterectomy.  Patient is a 59 y.o. female presenting with abdominal pain, constipation, and chest pain.  Abdominal Pain Associated symptoms: chest pain, constipation and nausea   Associated symptoms: no chills, no cough, no diarrhea, no fatigue, no fever, no hematuria, no shortness of breath and no vomiting   Constipation Associated symptoms: abdominal pain and nausea   Associated symptoms: no back pain, no diarrhea, no fever and no vomiting   Chest Pain Associated symptoms: abdominal pain and nausea   Associated symptoms: no back pain, no cough, no diaphoresis, no dizziness, no fatigue, no fever, no headache, no numbness, no shortness of breath, not vomiting and no weakness     Past Medical History  Diagnosis Date  . Hypertension   . Asthma   .  Gastritis   . Bacterial vaginosis   . GERD (gastroesophageal reflux disease)   . Diabetes type 2, uncontrolled     01/04/13- pt denies having diabetes  . Abdominal wall hernia 01/05/2013  . Pneumonia   . Diabetes mellitus without complication   . Headache   . Arthritis   . Blood clot in vein 2000    left knee after surgery   Past Surgical History  Procedure Laterality Date  . Left knee surgery Left 2000    arthroscopy and then blood clot removed  . Abdominal hysterectomy  1995    complete  . Dilation and curettage of uterus    . Ventral hernia repair N/A 07/20/2014    Procedure: LAPAROSCOPIC REPAIR INCARCARATED VENTRAL HERNIA AND UMBILICAL HERNIA;  Surgeon: Fanny Skates, MD;  Location: [facility];  Service: General;  Laterality: N/A;  . Insertion of mesh N/A 07/20/2014    Procedure: INSERTION OF MESH;  Surgeon: Fanny Skates, MD;  Location: [facility];  Service: General;  Laterality: N/A;   History reviewed. No pertinent family history. History  Substance Use Topics  . Smoking status: Current Every Day Smoker -- 1.50 packs/day for 40 years    Types: Cigarettes  . Smokeless tobacco: Never Used  . Alcohol Use: No   OB History    No data available     Review of Systems  Constitutional: Negative for fever, chills, diaphoresis and fatigue.  HENT: Negative for congestion, rhinorrhea and sneezing.   Eyes: Negative.   Respiratory: Negative for cough, chest tightness and shortness of breath.  Cardiovascular: Positive for chest pain. Negative for leg swelling.  Gastrointestinal: Positive for nausea, abdominal pain and constipation. Negative for vomiting, diarrhea and blood in stool.  Genitourinary: Negative for frequency, hematuria, flank pain and difficulty urinating.  Musculoskeletal: Negative for back pain and arthralgias.  Skin: Negative for rash.  Neurological: Negative for dizziness, speech difficulty, weakness, numbness and headaches.      Allergies  Aspirin; Ibuprofen; and  Penicillins  Home Medications   Prior to Admission medications   Medication Sig Start Date End Date Taking? Authorizing Provider  albuterol (PROVENTIL HFA;VENTOLIN HFA) 108 (90 BASE) MCG/ACT inhaler Inhale 2 puffs into the lungs every 6 (six) hours as needed for wheezing.   Yes Historical Provider, MD  allopurinol (ZYLOPRIM) 100 MG tablet Take 1 tablet (100 mg total) by mouth daily. 09/13/14  Yes Lorayne Marek, MD  amLODipine (NORVASC) 10 MG tablet Take 1 tablet (10 mg total) by mouth daily. 11/22/14  Yes Lorayne Marek, MD  blood glucose meter kit and supplies KIT Check blood sugar TID & QHS 09/16/14  Yes Deepak Advani, MD  glimepiride (AMARYL) 1 MG tablet Take 1 tablet (1 mg total) by mouth daily before breakfast. 11/22/14  Yes Lorayne Marek, MD  glimepiride (AMARYL) 4 MG tablet Take 4 mg by mouth daily with breakfast.  12/31/14  Yes Historical Provider, MD  hydrochlorothiazide (HYDRODIURIL) 50 MG tablet Take 1 tablet (50 mg total) by mouth daily. 11/22/14  Yes Lorayne Marek, MD  lisinopril (PRINIVIL,ZESTRIL) 5 MG tablet Take 5 mg by mouth daily.   Yes Historical Provider, MD  magnesium hydroxide (MILK OF MAGNESIA) 400 MG/5ML suspension Take 30 mLs by mouth daily as needed for mild constipation.   Yes Historical Provider, MD  metFORMIN (GLUCOPHAGE) 500 MG tablet Take 1 tablet (500 mg total) by mouth daily with breakfast. 11/22/14  Yes Deepak Advani, MD  pantoprazole (PROTONIX) 20 MG tablet Take 20 mg by mouth daily.   Yes Historical Provider, MD  azithromycin (ZITHROMAX Z-PAK) 250 MG tablet 2 po day one, then 1 daily x 4 days Patient not taking: Reported on 01/13/2015 07/06/14   Veryl Speak, MD  docusate sodium (COLACE) 100 MG capsule Take 1 capsule (100 mg total) by mouth every 12 (twelve) hours. 01/13/15   Malvin Johns, MD  GlucoCom Lancets MISC Check blood sugar TID & QHS Patient not taking: Reported on 01/13/2015 09/16/14   Lorayne Marek, MD  glucose blood (CHOICE DM FORA G20 TEST STRIPS) test strip  Check blood sugar TID & QHS Patient not taking: Reported on 01/13/2015 09/16/14   Lorayne Marek, MD  HYDROcodone-acetaminophen (NORCO/VICODIN) 5-325 MG per tablet Take 1 tablet by mouth every 4 (four) hours as needed for moderate pain. Patient not taking: Reported on 01/13/2015 07/27/14   Fanny Skates, MD  HYDROcodone-acetaminophen (NORCO/VICODIN) 5-325 MG per tablet Take 1 tablet by mouth every 6 (six) hours as needed. Patient not taking: Reported on 01/13/2015 11/17/14   Delos Haring, PA-C  lisinopril (PRINIVIL,ZESTRIL) 5 MG tablet Take 1 tablet (5 mg total) by mouth every morning. Patient not taking: Reported on 01/13/2015 11/22/14   Lorayne Marek, MD  meclizine (ANTIVERT) 25 MG tablet Take 1 tablet (25 mg total) by mouth 3 (three) times daily as needed for dizziness. 07/06/14   Veryl Speak, MD  meloxicam (MOBIC) 7.5 MG tablet Take 1 tablet (7.5 mg total) by mouth daily. Patient not taking: Reported on 01/13/2015 09/09/14   Lorayne Marek, MD  nicotine (NICODERM CQ) 21 mg/24hr patch Place 1 patch (21 mg total)  onto the skin daily. Patient not taking: Reported on 01/13/2015 11/22/14   Lorayne Marek, MD  ofloxacin (OCUFLOX) 0.3 % ophthalmic solution Place 1 drop into both eyes 4 (four) times daily. Patient not taking: Reported on 01/13/2015 09/16/14   Lorayne Marek, MD  pantoprazole (PROTONIX) 20 MG tablet Take 1 tablet (20 mg total) by mouth daily. Patient not taking: Reported on 01/13/2015 11/22/14   Lorayne Marek, MD  pioglitazone (ACTOS) 15 MG tablet Take 15 mg by mouth daily.    Historical Provider, MD  sucralfate (CARAFATE) 1 G tablet Take 1 tablet (1 g total) by mouth 4 (four) times daily -  with meals and at bedtime. 01/13/15   Malvin Johns, MD   BP 133/63 mmHg  Pulse 84  Temp(Src) 98 F (36.7 C) (Oral)  Resp 17  SpO2 99% Physical Exam  Constitutional: She is oriented to person, place, and time. She appears well-developed and well-nourished.  HENT:  Head: Normocephalic and atraumatic.  Eyes:  Pupils are equal, round, and reactive to light.  Neck: Normal range of motion. Neck supple.  Cardiovascular: Normal rate, regular rhythm and normal heart sounds.   Pulmonary/Chest: Effort normal and breath sounds normal. No respiratory distress. She has no wheezes. She has no rales. She exhibits no tenderness.  Abdominal: Soft. Bowel sounds are normal. There is tenderness (positive tenderness across the upper abdomen). There is no rebound and no guarding.  Musculoskeletal: Normal range of motion. She exhibits no edema.  Lymphadenopathy:    She has no cervical adenopathy.  Neurological: She is alert and oriented to person, place, and time.  Skin: Skin is warm and dry. No rash noted.  Psychiatric: She has a normal mood and affect.    ED Course  Procedures (including critical care time) Labs Review Labs Reviewed  LIPASE, BLOOD - Abnormal; Notable for the following:    Lipase 121 (*)    All other components within normal limits  COMPREHENSIVE METABOLIC PANEL - Abnormal; Notable for the following:    Potassium 3.4 (*)    Glucose, Bld 185 (*)    AST 14 (*)    ALT 10 (*)    All other components within normal limits  CBC - Abnormal; Notable for the following:    RBC 5.42 (*)    All other components within normal limits  URINALYSIS, ROUTINE W REFLEX MICROSCOPIC (NOT AT [facility])  I-STAT TROPOININ, ED  Randolm Idol, ED    Imaging Review Dg Chest 2 View  01/13/2015   CLINICAL DATA:  Upper abdominal pain, nausea and constipation for the past 5 days. Chest pain radiating into the left shoulder and arm for the past 2 days.  EXAM: CHEST  2 VIEW  COMPARISON:  07/22/2014.  FINDINGS: Normal sized heart. Small amount of linear density in the lingula. Otherwise, clear lungs. The interstitial markings remain mildly prominent with mild diffuse peribronchial thickening. Mild thoracic spine degenerative changes.  IMPRESSION: No acute abnormality. Mild chronic bronchitic changes and small amount of  lingular scarring.   Electronically Signed   By: Claudie Revering M.D.   On: 01/13/2015 18:58   Dg Abd 1 View  01/13/2015   CLINICAL DATA:  Upper abdomen pain, nausea, constipation for 5 days  EXAM: ABDOMEN - 1 VIEW  COMPARISON:  None.  FINDINGS: The bowel gas pattern is normal. Moderate bowel content is identified throughout colon. Surgical clips are identified in the pelvis. No radio-opaque calculi or other significant radiographic abnormality are seen.  IMPRESSION: No bowel obstruction. Moderate  bowel content is identified throughout colon consistent with constipation.   Electronically Signed   By: Abelardo Diesel M.D.   On: 01/13/2015 21:57     EKG Interpretation   Date/Time:  Thursday January 13 2015 18:26:45 EDT Ventricular Rate:  88 PR Interval:  143 QRS Duration: 88 QT Interval:  402 QTC Calculation: 486 R Axis:   53 Text Interpretation:  since last tracing no significant change Confirmed  by Andriea Hasegawa  MD, Avyukt Cimo ([id number]) on 01/13/2015 9:32:11 PM      MDM   Final diagnoses:  Gastritis  Acute pancreatitis, unspecified pancreatitis type    Patient presents with upper abdominal pain. It does not sound cardiac in nature. She does have some radiation to her chest but it's very fleeting. She doesn't have any other associated symptoms. She has no EKG changes. She's had 2 troponins have been negative. Her symptoms were markedly improved after GI cocktail. She does have some mild elevation in her lipase which she's had several times in the past. She's had a past gallbladder ultrasound which is been negative for gallstones. She denies any alcohol use. She is very antsy to go home. She states she feels much better. I gave her prescription for Carafate. She started on Protonix. I also gave her prescription for Colace for constipation. She was advised to use a clear liquid diet for the next 2-3 days. Return precautions were given.    Malvin Johns, MD 01/13/15 2238

## 2015-01-13 NOTE — ED Notes (Signed)
Unable to provide urine sample

## 2015-01-13 NOTE — Discharge Instructions (Signed)
Acute Pancreatitis Acute pancreatitis is a disease in which the pancreas becomes suddenly inflamed. The pancreas is a large gland located behind your stomach. The pancreas produces enzymes that help digest food. The pancreas also releases the hormones glucagon and insulin that help regulate blood sugar. Damage to the pancreas occurs when the digestive enzymes from the pancreas are activated and begin attacking the pancreas before being released into the intestine. Most acute attacks last a couple of days and can cause serious complications. Some people become dehydrated and develop low blood pressure. In severe cases, bleeding into the pancreas can lead to shock and can be life-threatening. The lungs, heart, and kidneys may fail. CAUSES  Pancreatitis can happen to anyone. In some cases, the cause is unknown. Most cases are caused by:  Alcohol abuse.  Gallstones. Other less common causes are:  Certain medicines.  Exposure to certain chemicals.  Infection.  Damage caused by an accident (trauma).  Abdominal surgery. SYMPTOMS   Pain in the upper abdomen that may radiate to the back.  Tenderness and swelling of the abdomen.  Nausea and vomiting. DIAGNOSIS  Your caregiver will perform a physical exam. Blood and stool tests may be done to confirm the diagnosis. Imaging tests may also be done, such as X-rays, CT scans, or an ultrasound of the abdomen. TREATMENT  Treatment usually requires a stay in the hospital. Treatment may include:  Pain medicine.  Fluid replacement through an intravenous line (IV).  Placing a tube in the stomach to remove stomach contents and control vomiting.  Not eating for 3 or 4 days. This gives your pancreas a rest, because enzymes are not being produced that can cause further damage.  Antibiotic medicines if your condition is caused by an infection.  Surgery of the pancreas or gallbladder. HOME CARE INSTRUCTIONS   Follow the diet advised by your  caregiver. This may involve avoiding alcohol and decreasing the amount of fat in your diet.  Eat smaller, more frequent meals. This reduces the amount of digestive juices the pancreas produces.  Drink enough fluids to keep your urine clear or pale yellow.  Only take over-the-counter or prescription medicines as directed by your caregiver.  Avoid drinking alcohol if it caused your condition.  Do not smoke.  Get plenty of rest.  Check your blood sugar at home as directed by your caregiver.  Keep all follow-up appointments as directed by your caregiver. SEEK MEDICAL CARE IF:   You do not recover as quickly as expected.  You develop new or worsening symptoms.  You have persistent pain, weakness, or nausea.  You recover and then have another episode of pain. SEEK IMMEDIATE MEDICAL CARE IF:   You are unable to eat or keep fluids down.  Your pain becomes severe.  You have a fever or persistent symptoms for more than 2 to 3 days.  You have a fever and your symptoms suddenly get worse.  Your skin or the white part of your eyes turn yellow (jaundice).  You develop vomiting.  You feel dizzy, or you faint.  Your blood sugar is high (over 300 mg/dL). MAKE SURE YOU:   Understand these instructions.  Will watch your condition.  Will get help right away if you are not doing well or get worse. Document Released: 06/04/2005 Document Revised: 12/04/2011 Document Reviewed: 09/13/2011 [facility] Patient Information 2015 [place], [place]. This information is not intended to replace advice given to you by your health care provider. Make sure you discuss any questions you have  with your health care provider.  Gastritis, Adult Gastritis is soreness and swelling (inflammation) of the lining of the stomach. Gastritis can develop as a sudden onset (acute) or long-term (chronic) condition. If gastritis is not treated, it can lead to stomach bleeding and ulcers. CAUSES  Gastritis occurs when  the stomach lining is weak or damaged. Digestive juices from the stomach then inflame the weakened stomach lining. The stomach lining may be weak or damaged due to viral or bacterial infections. One common bacterial infection is the Helicobacter pylori infection. Gastritis can also result from excessive alcohol consumption, taking certain medicines, or having too much acid in the stomach.  SYMPTOMS  In some cases, there are no symptoms. When symptoms are present, they may include:  Pain or a burning sensation in the upper abdomen.  Nausea.  Vomiting.  An uncomfortable feeling of fullness after eating. DIAGNOSIS  Your caregiver may suspect you have gastritis based on your symptoms and a physical exam. To determine the cause of your gastritis, your caregiver may perform the following:  Blood or stool tests to check for the H pylori bacterium.  Gastroscopy. A thin, flexible tube (endoscope) is passed down the esophagus and into the stomach. The endoscope has a light and camera on the end. Your caregiver uses the endoscope to view the inside of the stomach.  Taking a tissue sample (biopsy) from the stomach to examine under a microscope. TREATMENT  Depending on the cause of your gastritis, medicines may be prescribed. If you have a bacterial infection, such as an H pylori infection, antibiotics may be given. If your gastritis is caused by too much acid in the stomach, H2 blockers or antacids may be given. Your caregiver may recommend that you stop taking aspirin, ibuprofen, or other nonsteroidal anti-inflammatory drugs (NSAIDs). HOME CARE INSTRUCTIONS  Only take over-the-counter or prescription medicines as directed by your caregiver.  If you were given antibiotic medicines, take them as directed. Finish them even if you start to feel better.  Drink enough fluids to keep your urine clear or pale yellow.  Avoid foods and drinks that make your symptoms worse, such as:  Caffeine or alcoholic  drinks.  Chocolate.  Peppermint or mint flavorings.  Garlic and onions.  Spicy foods.  Citrus fruits, such as oranges, lemons, or limes.  Tomato-based foods such as sauce, chili, salsa, and pizza.  Fried and fatty foods.  Eat small, frequent meals instead of large meals. SEEK IMMEDIATE MEDICAL CARE IF:   You have black or dark red stools.  You vomit blood or material that looks like coffee grounds.  You are unable to keep fluids down.  Your abdominal pain gets worse.  You have a fever.  You do not feel better after 1 week.  You have any other questions or concerns. MAKE SURE YOU:  Understand these instructions.  Will watch your condition.  Will get help right away if you are not doing well or get worse. Document Released: 05/29/2001 Document Revised: 12/04/2011 Document Reviewed: 07/18/2011 [facility] Patient Information 2015 [place], [place]. This information is not intended to replace advice given to you by your health care provider. Make sure you discuss any questions you have with your health care provider.

## 2015-01-13 NOTE — ED Notes (Signed)
Pt c/o upper abdominal pain, nausea, and constipation x 5 days and chest pain radiating L shoulder and arm x 2 days.  Pain score 9/10.  Pt reports taking Milk of magnesia w/o relief.  Hx of HTN, DM, and gastritis.

## 2015-01-17 ENCOUNTER — Encounter (HOSPITAL_BASED_OUTPATIENT_CLINIC_OR_DEPARTMENT_OTHER): Payer: Self-pay | Admitting: *Deleted

## 2015-01-20 ENCOUNTER — Ambulatory Visit (HOSPITAL_BASED_OUTPATIENT_CLINIC_OR_DEPARTMENT_OTHER): Admission: RE | Admit: 2015-01-20 | Payer: Medicare Other | Source: Ambulatory Visit | Admitting: Orthopedic Surgery

## 2015-01-20 ENCOUNTER — Encounter (HOSPITAL_BASED_OUTPATIENT_CLINIC_OR_DEPARTMENT_OTHER): Admission: RE | Payer: Self-pay | Source: Ambulatory Visit

## 2015-01-20 HISTORY — DX: Personal history of other diseases of the circulatory system: Z86.79

## 2015-01-20 HISTORY — DX: Adverse effect of unspecified anesthetic, initial encounter: T41.45XA

## 2015-01-20 HISTORY — DX: Acute pancreatitis without necrosis or infection, unspecified: K85.90

## 2015-01-20 HISTORY — DX: Personal history of other venous thrombosis and embolism: Z86.718

## 2015-01-20 HISTORY — DX: Other complications of anesthesia, initial encounter: T88.59XA

## 2015-01-20 HISTORY — DX: Type 2 diabetes mellitus without complications: E11.9

## 2015-01-20 HISTORY — DX: Alcohol abuse, in remission: F10.11

## 2015-01-20 HISTORY — DX: Chronic obstructive pulmonary disease, unspecified: J44.9

## 2015-01-20 HISTORY — DX: Localized edema: R60.0

## 2015-01-20 HISTORY — DX: Personal history of other diseases of the respiratory system: Z87.09

## 2015-01-20 HISTORY — DX: Lesion of ulnar nerve, unspecified upper limb: G56.20

## 2015-01-20 HISTORY — DX: Benign neoplasm of unspecified adrenal gland: D35.00

## 2015-01-20 HISTORY — DX: Personal history of other diseases of the digestive system: Z87.19

## 2015-01-20 SURGERY — ULNAR NERVE DECOMPRESSION/TRANSPOSITION
Anesthesia: General | Site: Elbow | Laterality: Right

## 2015-01-31 ENCOUNTER — Other Ambulatory Visit: Payer: Self-pay

## 2015-01-31 MED ORDER — ALLOPURINOL 100 MG PO TABS: 100.0000 mg | ORAL_TABLET | Freq: Every day | ORAL | Status: DC

## 2015-03-02 ENCOUNTER — Other Ambulatory Visit: Payer: Self-pay | Admitting: Family Medicine

## 2015-03-02 DIAGNOSIS — E139 Other specified diabetes mellitus without complications: Secondary | ICD-10-CM

## 2015-03-02 DIAGNOSIS — E1165 Type 2 diabetes mellitus with hyperglycemia: Secondary | ICD-10-CM

## 2015-03-02 DIAGNOSIS — I10 Essential (primary) hypertension: Secondary | ICD-10-CM

## 2015-03-02 DIAGNOSIS — K219 Gastro-esophageal reflux disease without esophagitis: Secondary | ICD-10-CM

## 2015-03-02 DIAGNOSIS — M159 Polyosteoarthritis, unspecified: Secondary | ICD-10-CM

## 2015-03-02 DIAGNOSIS — IMO0002 Reserved for concepts with insufficient information to code with codable children: Secondary | ICD-10-CM

## 2015-03-02 MED ORDER — AMLODIPINE BESYLATE 10 MG PO TABS: 10.0000 mg | ORAL_TABLET | Freq: Every day | ORAL | Status: DC

## 2015-03-02 MED ORDER — PANTOPRAZOLE SODIUM 20 MG PO TBEC: 20.0000 mg | DELAYED_RELEASE_TABLET | Freq: Every day | ORAL | Status: DC

## 2015-03-02 MED ORDER — GLIMEPIRIDE 1 MG PO TABS: 1.0000 mg | ORAL_TABLET | Freq: Every day | ORAL | Status: DC

## 2015-03-02 MED ORDER — MELOXICAM 7.5 MG PO TABS: 7.5000 mg | ORAL_TABLET | Freq: Every day | ORAL | Status: DC

## 2015-03-17 ENCOUNTER — Other Ambulatory Visit: Payer: Self-pay

## 2015-03-17 DIAGNOSIS — Z1231 Encounter for screening mammogram for malignant neoplasm of breast: Secondary | ICD-10-CM

## 2015-04-01 ENCOUNTER — Ambulatory Visit
Admission: RE | Admit: 2015-04-01 | Discharge: 2015-04-01 | Disposition: A | Discharge status: Home / Self Care | Payer: Medicare Other | Source: Ambulatory Visit

## 2015-04-01 ENCOUNTER — Telehealth: Payer: Self-pay | Admitting: Family Medicine

## 2015-04-01 DIAGNOSIS — I10 Essential (primary) hypertension: Secondary | ICD-10-CM

## 2015-04-01 DIAGNOSIS — Z1231 Encounter for screening mammogram for malignant neoplasm of breast: Secondary | ICD-10-CM

## 2015-04-01 MED ORDER — HYDROCHLOROTHIAZIDE 25 MG PO TABS: 25.0000 mg | ORAL_TABLET | Freq: Every day | ORAL | Status: DC

## 2015-04-01 NOTE — Telephone Encounter (Signed)
HCTZ refilled to 25 mg which is recommended max Patient will need OV for DM2 and HTN f/u with new PCP please schedule.

## 2015-04-28 ENCOUNTER — Telehealth: Payer: Self-pay | Admitting: Internal Medicine

## 2015-04-28 DIAGNOSIS — M159 Polyosteoarthritis, unspecified: Secondary | ICD-10-CM

## 2015-04-28 NOTE — Telephone Encounter (Signed)
allopurinol (ZYLOPRIM) 100 MG tablet ° °

## 2015-04-28 NOTE — Telephone Encounter (Signed)
meloxicam (MOBIC) 7.5 MG tablet

## 2015-05-02 MED ORDER — MELOXICAM 7.5 MG PO TABS: 7.5000 mg | ORAL_TABLET | Freq: Every day | ORAL | Status: DC

## 2015-05-02 MED ORDER — ALLOPURINOL 100 MG PO TABS: 100.0000 mg | ORAL_TABLET | Freq: Every day | ORAL | Status: DC

## 2015-05-02 NOTE — Telephone Encounter (Signed)
Rx send to [facility]

## 2015-05-04 ENCOUNTER — Emergency Department (HOSPITAL_COMMUNITY)
Admission: EM | Admit: 2015-05-04 | Discharge: 2015-05-04 | Disposition: A | Discharge status: Home / Self Care | Payer: Medicare Other | Attending: Physician Assistant | Admitting: Physician Assistant

## 2015-05-04 ENCOUNTER — Emergency Department (HOSPITAL_COMMUNITY): Payer: Medicare Other

## 2015-05-04 ENCOUNTER — Encounter (HOSPITAL_COMMUNITY): Payer: Self-pay

## 2015-05-04 DIAGNOSIS — R1010 Upper abdominal pain, unspecified: Secondary | ICD-10-CM

## 2015-05-04 DIAGNOSIS — R1011 Right upper quadrant pain: Secondary | ICD-10-CM | POA: Diagnosis present

## 2015-05-04 DIAGNOSIS — K2970 Gastritis, unspecified, without bleeding: Secondary | ICD-10-CM | POA: Insufficient documentation

## 2015-05-04 DIAGNOSIS — E876 Hypokalemia: Secondary | ICD-10-CM | POA: Diagnosis not present

## 2015-05-04 DIAGNOSIS — K219 Gastro-esophageal reflux disease without esophagitis: Secondary | ICD-10-CM | POA: Insufficient documentation

## 2015-05-04 DIAGNOSIS — Z8669 Personal history of other diseases of the nervous system and sense organs: Secondary | ICD-10-CM | POA: Insufficient documentation

## 2015-05-04 DIAGNOSIS — E119 Type 2 diabetes mellitus without complications: Secondary | ICD-10-CM | POA: Insufficient documentation

## 2015-05-04 DIAGNOSIS — I10 Essential (primary) hypertension: Secondary | ICD-10-CM | POA: Diagnosis not present

## 2015-05-04 DIAGNOSIS — Z792 Long term (current) use of antibiotics: Secondary | ICD-10-CM | POA: Diagnosis not present

## 2015-05-04 DIAGNOSIS — Z86018 Personal history of other benign neoplasm: Secondary | ICD-10-CM | POA: Insufficient documentation

## 2015-05-04 DIAGNOSIS — Z7984 Long term (current) use of oral hypoglycemic drugs: Secondary | ICD-10-CM | POA: Insufficient documentation

## 2015-05-04 DIAGNOSIS — Z880 Allergy status to penicillin: Secondary | ICD-10-CM | POA: Diagnosis not present

## 2015-05-04 DIAGNOSIS — R110 Nausea: Secondary | ICD-10-CM

## 2015-05-04 DIAGNOSIS — Z79899 Other long term (current) drug therapy: Secondary | ICD-10-CM | POA: Insufficient documentation

## 2015-05-04 DIAGNOSIS — Z86718 Personal history of other venous thrombosis and embolism: Secondary | ICD-10-CM | POA: Diagnosis not present

## 2015-05-04 DIAGNOSIS — F17210 Nicotine dependence, cigarettes, uncomplicated: Secondary | ICD-10-CM | POA: Insufficient documentation

## 2015-05-04 DIAGNOSIS — Z791 Long term (current) use of non-steroidal anti-inflammatories (NSAID): Secondary | ICD-10-CM | POA: Diagnosis not present

## 2015-05-04 DIAGNOSIS — I509 Heart failure, unspecified: Secondary | ICD-10-CM | POA: Diagnosis not present

## 2015-05-04 DIAGNOSIS — J449 Chronic obstructive pulmonary disease, unspecified: Secondary | ICD-10-CM | POA: Diagnosis not present

## 2015-05-04 DIAGNOSIS — M1990 Unspecified osteoarthritis, unspecified site: Secondary | ICD-10-CM | POA: Insufficient documentation

## 2015-05-04 LAB — COMPREHENSIVE METABOLIC PANEL
ALT: 12 U/L — ABNORMAL LOW (ref 14–54)
ANION GAP: 8 (ref 5–15)
AST: 13 U/L — ABNORMAL LOW (ref 15–41)
Albumin: 4.3 g/dL (ref 3.5–5.0)
Alkaline Phosphatase: 95 U/L (ref 38–126)
BUN: 13 mg/dL (ref 6–20)
CHLORIDE: 99 mmol/L — AB (ref 101–111)
CO2: 27 mmol/L (ref 22–32)
Calcium: 9.7 mg/dL (ref 8.9–10.3)
Creatinine, Ser: 0.69 mg/dL (ref 0.44–1.00)
Glucose, Bld: 105 mg/dL — ABNORMAL HIGH (ref 65–99)
POTASSIUM: 3.2 mmol/L — AB (ref 3.5–5.1)
SODIUM: 134 mmol/L — AB (ref 135–145)
Total Bilirubin: 0.6 mg/dL (ref 0.3–1.2)
Total Protein: 8.2 g/dL — ABNORMAL HIGH (ref 6.5–8.1)

## 2015-05-04 LAB — I-STAT TROPONIN, ED: Troponin i, poc: 0 ng/mL (ref 0.00–0.08)

## 2015-05-04 LAB — CBC
HEMATOCRIT: 43.2 % (ref 36.0–46.0)
HEMOGLOBIN: 15.3 g/dL — AB (ref 12.0–15.0)
MCH: 27.7 pg (ref 26.0–34.0)
MCHC: 35.4 g/dL (ref 30.0–36.0)
MCV: 78.1 fL (ref 78.0–100.0)
PLATELETS: 258 10*3/uL (ref 150–400)
RBC: 5.53 MIL/uL — ABNORMAL HIGH (ref 3.87–5.11)
RDW: 14.4 % (ref 11.5–15.5)
WBC: 6 10*3/uL (ref 4.0–10.5)

## 2015-05-04 LAB — LIPASE, BLOOD: LIPASE: 30 U/L (ref 11–51)

## 2015-05-04 LAB — URINALYSIS, ROUTINE W REFLEX MICROSCOPIC
Bilirubin Urine: NEGATIVE
Glucose, UA: NEGATIVE mg/dL
Hgb urine dipstick: NEGATIVE
Ketones, ur: NEGATIVE mg/dL
LEUKOCYTES UA: NEGATIVE
Nitrite: NEGATIVE
PH: 5 (ref 5.0–8.0)
PROTEIN: NEGATIVE mg/dL
Specific Gravity, Urine: 1.016 (ref 1.005–1.030)

## 2015-05-04 MED ORDER — ONDANSETRON 4 MG PO TBDP: 4.0000 mg | ORAL_TABLET | Freq: Three times a day (TID) | ORAL | Status: DC | PRN

## 2015-05-04 MED ORDER — GI COCKTAIL ~~LOC~~
30.0000 mL | Freq: Once | ORAL | Status: AC
  Administered 2015-05-04: 30 mL via ORAL
  Filled 2015-05-04: qty 30

## 2015-05-04 MED ORDER — PANTOPRAZOLE SODIUM 20 MG PO TBEC: 20.0000 mg | DELAYED_RELEASE_TABLET | Freq: Every day | ORAL | Status: DC

## 2015-05-04 MED ORDER — MORPHINE SULFATE (PF) 4 MG/ML IV SOLN
4.0000 mg | Freq: Once | INTRAVENOUS | Status: DC
  Filled 2015-05-04: qty 1

## 2015-05-04 MED ORDER — SODIUM CHLORIDE 0.9 % IV BOLUS (SEPSIS)
1000.0000 mL | Freq: Once | INTRAVENOUS | Status: AC
  Administered 2015-05-04: 1000 mL via INTRAVENOUS

## 2015-05-04 MED ORDER — POTASSIUM CHLORIDE CRYS ER 20 MEQ PO TBCR
40.0000 meq | EXTENDED_RELEASE_TABLET | Freq: Once | ORAL | Status: AC
  Administered 2015-05-04: 40 meq via ORAL
  Filled 2015-05-04: qty 2

## 2015-05-04 MED ORDER — PANTOPRAZOLE SODIUM 40 MG IV SOLR
40.0000 mg | Freq: Once | INTRAVENOUS | Status: AC
  Administered 2015-05-04: 40 mg via INTRAVENOUS
  Filled 2015-05-04: qty 40

## 2015-05-04 MED ORDER — MORPHINE SULFATE (PF) 4 MG/ML IV SOLN
4.0000 mg | Freq: Once | INTRAVENOUS | Status: AC
  Administered 2015-05-04: 4 mg via INTRAVENOUS
  Filled 2015-05-04: qty 1

## 2015-05-04 MED ORDER — ONDANSETRON HCL 4 MG/2ML IJ SOLN
4.0000 mg | Freq: Once | INTRAMUSCULAR | Status: AC
  Administered 2015-05-04: 4 mg via INTRAVENOUS
  Filled 2015-05-04: qty 2

## 2015-05-04 MED ORDER — ONDANSETRON HCL 4 MG/2ML IJ SOLN
4.0000 mg | Freq: Once | INTRAMUSCULAR | Status: DC
  Filled 2015-05-04: qty 2

## 2015-05-04 NOTE — Discharge Instructions (Signed)
Your abdominal pain is likely from gastritis or an ulcer. You will need to take your home protonix as directed, and avoid spicy/fatty/acidic foods. Avoid laying down flat within 30 minutes of eating. Avoid NSAIDs like ibuprofen/excedrin on an empty stomach. Use zofran as needed for nausea. Use tylenol as needed for pain. Consider using over the counter tums or maalox for additional relief. Follow up with the gastroenterologist in one week for ongoing evaluation of your abdominal pain. Return to the ER for changes or worsening symptoms.  Abdominal (belly) pain can be caused by many things. Your caregiver performed an examination and possibly ordered blood/urine tests and imaging (CT scan, x-rays, ultrasound). Many cases can be observed and treated at home after initial evaluation in the emergency department. Even though you are being discharged home, abdominal pain can be unpredictable. Therefore, you need a repeated exam if your pain does not resolve, returns, or worsens. Most patients with abdominal pain don't have to be admitted to the hospital or have surgery, but serious problems like appendicitis and gallbladder attacks can start out as nonspecific pain. Many abdominal conditions cannot be diagnosed in one visit, so follow-up evaluations are very important. SEEK IMMEDIATE MEDICAL ATTENTION IF YOU DEVELOP ANY OF THE FOLLOWING SYMPTOMS:  The pain does not go away or becomes severe.   A temperature above 101 develops.   Repeated vomiting occurs (multiple episodes).   The pain becomes localized to portions of the abdomen. The right side could possibly be appendicitis. In an adult, the left lower portion of the abdomen could be colitis or diverticulitis.   Blood is being passed in stools or vomit (bright red or black tarry stools).   Return also if you develop chest pain, difficulty breathing, dizziness or fainting, or become confused, poorly responsive, or inconsolable (young children).  The  constipation stays for more than 4 days.   There is belly (abdominal) or rectal pain.   You do not seem to be getting better.      Gastritis, Adult Gastritis is soreness and swelling (inflammation) of the lining of the stomach. Gastritis can develop as a sudden onset (acute) or long-term (chronic) condition. If gastritis is not treated, it can lead to stomach bleeding and ulcers. CAUSES  Gastritis occurs when the stomach lining is weak or damaged. Digestive juices from the stomach then inflame the weakened stomach lining. The stomach lining may be weak or damaged due to viral or bacterial infections. One common bacterial infection is the Helicobacter pylori infection. Gastritis can also result from excessive alcohol consumption, taking certain medicines, or having too much acid in the stomach.  SYMPTOMS  In some cases, there are no symptoms. When symptoms are present, they may include:  Pain or a burning sensation in the upper abdomen.  Nausea.  Vomiting.  An uncomfortable feeling of fullness after eating. DIAGNOSIS  Your caregiver may suspect you have gastritis based on your symptoms and a physical exam. To determine the cause of your gastritis, your caregiver may perform the following:  Blood or stool tests to check for the H pylori bacterium.  Gastroscopy. A thin, flexible tube (endoscope) is passed down the esophagus and into the stomach. The endoscope has a light and camera on the end. Your caregiver uses the endoscope to view the inside of the stomach.  Taking a tissue sample (biopsy) from the stomach to examine under a microscope. TREATMENT  Depending on the cause of your gastritis, medicines may be prescribed. If you have a bacterial infection,  such as an H pylori infection, antibiotics may be given. If your gastritis is caused by too much acid in the stomach, H2 blockers or antacids may be given. Your caregiver may recommend that you stop taking aspirin, ibuprofen, or other  nonsteroidal anti-inflammatory drugs (NSAIDs). HOME CARE INSTRUCTIONS  Only take over-the-counter or prescription medicines as directed by your caregiver.  If you were given antibiotic medicines, take them as directed. Finish them even if you start to feel better.  Drink enough fluids to keep your urine clear or pale yellow.  Avoid foods and drinks that make your symptoms worse, such as:  Caffeine or alcoholic drinks.  Chocolate.  Peppermint or mint flavorings.  Garlic and onions.  Spicy foods.  Citrus fruits, such as oranges, lemons, or limes.  Tomato-based foods such as sauce, chili, salsa, and pizza.  Fried and fatty foods.  Eat small, frequent meals instead of large meals. SEEK IMMEDIATE MEDICAL CARE IF:   You have black or dark red stools.  You vomit blood or material that looks like coffee grounds.  You are unable to keep fluids down.  Your abdominal pain gets worse.  You have a fever.  You do not feel better after 1 week.  You have any other questions or concerns. MAKE SURE YOU:  Understand these instructions.  Will watch your condition.  Will get help right away if you are not doing well or get worse.   This information is not intended to replace advice given to you by your health care provider. Make sure you discuss any questions you have with your health care provider.   Document Released: 05/29/2001 Document Revised: 12/04/2011 Document Reviewed: 07/18/2011 Elsevier Interactive Patient Education 2016 [place] for Gastroesophageal Reflux Disease, Adult When you have gastroesophageal reflux disease (GERD), the foods you eat and your eating habits are very important. Choosing the right foods can help ease the discomfort of GERD. WHAT GENERAL GUIDELINES DO I NEED TO FOLLOW?  Choose fruits, vegetables, whole grains, low-fat dairy products, and low-fat meat, fish, and poultry.  Limit fats such as oils, salad dressings, butter,  nuts, and avocado.  Keep a food diary to identify foods that cause symptoms.  Avoid foods that cause reflux. These may be different for different people.  Eat frequent small meals instead of three large meals each day.  Eat your meals slowly, in a relaxed setting.  Limit fried foods.  Cook foods using methods other than frying.  Avoid drinking alcohol.  Avoid drinking large amounts of liquids with your meals.  Avoid bending over or lying down until 2-3 hours after eating. WHAT FOODS ARE NOT RECOMMENDED? The following are some foods and drinks that may worsen your symptoms: Vegetables Tomatoes. Tomato juice. Tomato and spaghetti sauce. Chili peppers. Onion and garlic. Horseradish. Fruits Oranges, grapefruit, and lemon (fruit and juice). Meats High-fat meats, fish, and poultry. This includes hot dogs, ribs, ham, sausage, salami, and bacon. Dairy Whole milk and chocolate milk. Sour cream. Cream. Butter. Ice cream. Cream cheese.  Beverages Coffee and tea, with or without caffeine. Carbonated beverages or energy drinks. Condiments Hot sauce. Barbecue sauce.  Sweets/Desserts Chocolate and cocoa. Donuts. Peppermint and spearmint. Fats and Oils High-fat foods, including [place] fries and potato chips. Other Vinegar. Strong spices, such as black pepper, white pepper, red pepper, cayenne, curry powder, cloves, ginger, and chili powder. The items listed above may not be a complete list of foods and beverages to avoid. Contact your dietitian for more information.  This information is not intended to replace advice given to you by your health care provider. Make sure you discuss any questions you have with your health care provider.   Document Released: 06/04/2005 Document Revised: 06/25/2014 Document Reviewed: 04/08/2013 Elsevier Interactive Patient Education 2016 [employer]  Nausea, Adult Nausea means you feel sick to your stomach or need to throw up (vomit). It may be a sign  of a more serious problem. If nausea gets worse, you may throw up. If you throw up a lot, you may lose too much body fluid (dehydration). HOME CARE   Get plenty of rest.  Ask your doctor how to replace body fluid losses (rehydrate).  Eat small amounts of food. Sip liquids more often.  Take all medicines as told by your doctor. GET HELP RIGHT AWAY IF:  You have a fever.  You pass out (faint).  You keep throwing up or have blood in your throw up.  You are very weak, have dry lips or a dry mouth, or you are very thirsty (dehydrated).  You have dark or bloody poop (stool).  You have very bad chest or belly (abdominal) pain.  You do not get better after 2 days, or you get worse.  You have a headache. MAKE SURE YOU:  Understand these instructions.  Will watch your condition.  Will get help right away if you are not doing well or get worse.   This information is not intended to replace advice given to you by your health care provider. Make sure you discuss any questions you have with your health care provider.   Document Released: 05/24/2011 Document Revised: 08/27/2011 Document Reviewed: 05/24/2011 Elsevier Interactive Patient Education [employer].

## 2015-05-04 NOTE — ED Provider Notes (Signed)
CSN: [id number]     Arrival date & time 05/04/15  1413 History   First MD Initiated Contact with Patient 05/04/15 1508     Chief Complaint  Patient presents with  . Abdominal Pain  . Nausea     (Consider location/radiation/quality/duration/timing/severity/associated sxs/prior Treatment) HPI Comments: Sarah [name] is a 59 y.o. female with a PMHx of HTN, gastritis, GERD, COPD, DM2, pancreatitis, adrenal adenoma, and alcohol abuse, with a PSHx of hysterectomy and ventral hernia repair, who presents to the ED with complaints of upper abdominal pain and nausea 1 day. Patient reports pain is 10/10 constant aching in the upper abdomen, nonradiating, worse with movement, and unrelieved with Carafate. She endorses recently taking more NSAIDs for foot pain.  she denies any fevers, chills, chest pain, shortness breath, vomiting, diarrhea, constipation, melena, hematochezia, obstipation, dysuria, hematuria, flank pain, numbness, tingling, weakness, recent travel, sick contacts, suspicious food intake, alcohol use, or antibiotic use.  Patient is a 59 y.o. female presenting with abdominal pain. The history is provided by the patient. No language interpreter was used.  Abdominal Pain Pain location:  LUQ, RUQ and epigastric Pain quality: aching   Pain radiates to:  Does not radiate Pain severity:  Severe Onset quality:  Gradual Duration:  1 day Timing:  Constant Progression:  Unchanged Chronicity:  Recurrent Context: not recent travel, not sick contacts and not suspicious food intake   Relieved by:  Nothing Worsened by:  Movement Ineffective treatments:  OTC medications (carafate) Associated symptoms: nausea   Associated symptoms: no chest pain, no chills, no constipation, no diarrhea, no dysuria, no fever, no flatus, no hematemesis, no hematochezia, no hematuria, no melena, no shortness of breath and no vomiting   Risk factors: NSAID use     Past Medical History  Diagnosis Date  .  Hypertension   . Gastritis     01-13-2015  per ED note  . GERD (gastroesophageal reflux disease)   . Headache   . Arthritis   . COPD (chronic obstructive pulmonary disease) ([place])   . Type 2 diabetes mellitus ([place])   . History of pulmonary edema     mild -  07-22-2014  post-op  surgery 07-20-2014  . History of acute pancreatitis     01-04-2013   . Acute pancreatitis     01-13-2015 per ED note  . Adrenal adenoma     bilateral  . Ulnar nerve compression     right elbow  . History of DVT of lower extremity     2000  post op left knee surgery  . History of acute congestive heart failure     07-22-2014   post-op surgery 07-20-2014  . Complication of anesthesia     post-op 07-20-2014 surgery post-op Hypoxia due to acute CHF/ mild Pulmonary edema  . Bilateral lower extremity edema   . History of alcohol abuse     state quit 2002/  drank daily >fifth of gin   Past Surgical History  Procedure Laterality Date  . Dilation and curettage of uterus    . Ventral hernia repair N/A 07/20/2014    Procedure: LAPAROSCOPIC REPAIR INCARCARATED VENTRAL HERNIA AND UMBILICAL HERNIA;  Surgeon: Fanny Skates, MD;  Location: [facility];  Service: General;  Laterality: N/A;  . Insertion of mesh N/A 07/20/2014    Procedure: INSERTION OF MESH;  Surgeon: Fanny Skates, MD;  Location: [facility];  Service: General;  Laterality: N/A;  . Knee arthroscopy Left 2000  . Total abdominal hysterectomy w/ bilateral salpingoophorectomy  1995  . Transthoracic echocardiogram  07-22-2014    mild LVH,  60-65%,  grade I diastolic dysfunction/  mild LAE   History reviewed. No pertinent family history. Social History  Substance Use Topics  . Smoking status: Current Every Day Smoker -- 1.50 packs/day for 40 years    Types: Cigarettes  . Smokeless tobacco: Never Used  . Alcohol Use: No     Comment: hx alcohol abuse- drank >fifth of gin daily/  states Quit 2002   OB History    No data available     Review of Systems    Constitutional: Negative for fever and chills.  Respiratory: Negative for shortness of breath.   Cardiovascular: Negative for chest pain.  Gastrointestinal: Positive for nausea and abdominal pain. Negative for vomiting, diarrhea, constipation, blood in stool, melena, hematochezia, flatus and hematemesis.  Genitourinary: Negative for dysuria, hematuria and flank pain.  Musculoskeletal: Negative for myalgias and arthralgias.  Skin: Negative for color change.  Allergic/Immunologic: Positive for immunocompromised state (diabetic).  Neurological: Negative for weakness and numbness.  Psychiatric/Behavioral: Negative for confusion.   10 Systems reviewed and are negative for acute change except as noted in the HPI.    Allergies  Aspirin; Ibuprofen; and Penicillins  Home Medications   Prior to Admission medications   Medication Sig Start Date End Date Taking? Authorizing Provider  albuterol (PROVENTIL HFA;VENTOLIN HFA) 108 (90 BASE) MCG/ACT inhaler Inhale 2 puffs into the lungs every 6 (six) hours as needed for wheezing.    Historical Provider, MD  allopurinol (ZYLOPRIM) 100 MG tablet Take 1 tablet (100 mg total) by mouth daily. 05/02/15   Josalyn Funches, MD  amLODipine (NORVASC) 10 MG tablet Take 1 tablet (10 mg total) by mouth daily. 03/02/15   Josalyn Funches, MD  docusate sodium (COLACE) 100 MG capsule Take 1 capsule (100 mg total) by mouth every 12 (twelve) hours. 01/13/15   Malvin Johns, MD  glimepiride (AMARYL) 1 MG tablet Take 1 tablet (1 mg total) by mouth daily before breakfast. 03/02/15   Boykin Nearing, MD  hydrochlorothiazide (HYDRODIURIL) 25 MG tablet Take 1 tablet (25 mg total) by mouth daily. 04/01/15   Boykin Nearing, MD  HYDROcodone-acetaminophen (NORCO/VICODIN) 5-325 MG per tablet Take 1 tablet by mouth every 4 (four) hours as needed for moderate pain. 07/27/14   Fanny Skates, MD  lisinopril (PRINIVIL,ZESTRIL) 5 MG tablet Take 1 tablet (5 mg total) by mouth every morning.  11/22/14   Lorayne Marek, MD  magnesium hydroxide (MILK OF MAGNESIA) 400 MG/5ML suspension Take 30 mLs by mouth daily as needed for mild constipation.    Historical Provider, MD  meclizine (ANTIVERT) 25 MG tablet Take 1 tablet (25 mg total) by mouth 3 (three) times daily as needed for dizziness. 07/06/14   Veryl Speak, MD  meloxicam (MOBIC) 7.5 MG tablet Take 1 tablet (7.5 mg total) by mouth daily. 05/02/15   Boykin Nearing, MD  metFORMIN (GLUCOPHAGE) 500 MG tablet Take 1 tablet (500 mg total) by mouth daily with breakfast. 11/22/14   Lorayne Marek, MD  nicotine (NICODERM CQ) 21 mg/24hr patch Place 1 patch (21 mg total) onto the skin daily. 11/22/14   Lorayne Marek, MD  ofloxacin (OCUFLOX) 0.3 % ophthalmic solution Place 1 drop into both eyes 4 (four) times daily. 09/16/14   Lorayne Marek, MD  pantoprazole (PROTONIX) 20 MG tablet Take 1 tablet (20 mg total) by mouth daily. 03/02/15   Josalyn Funches, MD  pioglitazone (ACTOS) 15 MG tablet Take 15 mg by mouth daily.  Historical Provider, MD  sucralfate (CARAFATE) 1 G tablet Take 1 tablet (1 g total) by mouth 4 (four) times daily -  with meals and at bedtime. 01/13/15   Malvin Johns, MD   BP 113/55 mmHg  Pulse 86  Temp(Src) 98.1 F (36.7 C) (Oral)  Resp 20  SpO2 98% Physical Exam  Constitutional: She is oriented to person, place, and time. Vital signs are normal. She appears well-developed and well-nourished.  Non-toxic appearance. No distress.  Afebrile, nontoxic, NAD  HENT:  Head: Normocephalic and atraumatic.  Mouth/Throat: Oropharynx is clear and moist and mucous membranes are normal.  Eyes: Conjunctivae and EOM are normal. Right eye exhibits no discharge. Left eye exhibits no discharge.  Neck: Normal range of motion. Neck supple.  Cardiovascular: Normal rate, regular rhythm, normal heart sounds and intact distal pulses.  Exam reveals no gallop and no friction rub.   No murmur heard. Pulmonary/Chest: Effort normal and breath sounds normal. No  respiratory distress. She has no decreased breath sounds. She has no wheezes. She has no rhonchi. She has no rales.  Abdominal: Soft. Normal appearance and bowel sounds are normal. She exhibits no distension. There is tenderness in the right upper quadrant and epigastric area. There is positive Murphy's sign. There is no rigidity, no rebound, no guarding, no CVA tenderness and no tenderness at McBurney's point.    Soft, nondistended, +BS throughout, with TTP in RUQ/epigastrum, no r/g/r, +murphy's, neg mcburney's, no CVA TTP   Musculoskeletal: Normal range of motion.  Neurological: She is alert and oriented to person, place, and time. She has normal strength. No sensory deficit.  Skin: Skin is warm, dry and intact. No rash noted.  Psychiatric: She has a normal mood and affect.  Nursing note and vitals reviewed.   ED Course  Procedures (including critical care time) Labs Review Labs Reviewed  COMPREHENSIVE METABOLIC PANEL - Abnormal; Notable for the following:    Sodium 134 (*)    Potassium 3.2 (*)    Chloride 99 (*)    Glucose, Bld 105 (*)    Total Protein 8.2 (*)    AST 13 (*)    ALT 12 (*)    All other components within normal limits  CBC - Abnormal; Notable for the following:    RBC 5.53 (*)    Hemoglobin 15.3 (*)    All other components within normal limits  URINALYSIS, ROUTINE W REFLEX MICROSCOPIC (NOT AT [facility]) - Abnormal; Notable for the following:    APPearance CLOUDY (*)    All other components within normal limits  LIPASE, BLOOD  I-STAT TROPOININ, ED    Imaging Review US Abdomen Complete  05/04/2015  CLINICAL DATA:  Mid abdominal pain.  Nausea. EXAM: ULTRASOUND ABDOMEN COMPLETE COMPARISON:  CT scan of the abdomen dated 04/02/2014 FINDINGS: Gallbladder: No gallstones or wall thickening visualized. No sonographic Murphy sign noted. Common bile duct: Diameter: 3 mm, normal. Liver: No focal lesion identified. Within normal limits in parenchymal echogenicity. IVC: No  abnormality visualized. Pancreas: Visualized portion unremarkable. Spleen: Size and appearance within normal limits.  7.3 cm length. Right Kidney: Length: 12.9 cm. Echogenicity within normal limits. No mass or hydronephrosis visualized. Left Kidney: Length: 11.8 cm. Echogenicity within normal limits. No mass or hydronephrosis visualized. Abdominal aorta: No aneurysm visualized.  2.4 cm maximal diameter. Other findings: None. IMPRESSION: Normal exam. Electronically Signed   By: Lorriane Shire M.D.   On: 05/04/2015 16:22   I have personally reviewed and evaluated these images and lab results  as part of my medical decision-making.   EKG Interpretation None     ED ECG REPORT   Date: 05/04/2015  Rate: 75  Rhythm: normal sinus rhythm and premature atrial contractions (PAC)  QRS Axis: normal  Intervals: normal  ST/T Wave abnormalities: normal  Conduction Disutrbances:none  Narrative Interpretation:   Old EKG Reviewed: none available  I have personally reviewed the EKG tracing and agree with the computerized printout as noted.   MDM   Final diagnoses:  Upper abdominal pain  Nausea  Hypokalemia  Gastritis  Gastroesophageal reflux disease, esophagitis presence not specified    59 y.o. female here with upper abd pain and nausea since yesterday. Has been taking more NSAIDs lately. On exam, tenderness in epigastrum and RUQ,+murphy's. Could be gastritis vs cholecystitis vs pancreatitis. Will await labs, get trop/EKG, and abd u/s. Will give pain meds, nausea meds, fluids, and protonix. Will reassess shortly.   5:11 PM Patient feeling improved. Trop neg, EKG still not done, will have this done now. Lipase WNL. CMP with mildly low K at 3.2, will give Kdur now (pt states she doesn't like this because it's a large pill, will give with GI cocktail). Otherwise CMP WNL. CBC unremarkable. Abd u/s unremarkable. Likely gastritis. Will reassess after EKG and U/A.   8:07 PM Pt provided U/A but was not  collected until just now, will await results.   9:10 PM U/A clear. Will d/c home with protonix and zofran, discussed diet modifications, and f/up with GI in 1-2wks for ongoing management. OTC tums/maalox discussed. Tylenol PRN pain. Avoid NSAIDs. I explained the diagnosis and have given explicit precautions to return to the ER including for any other new or worsening symptoms. The patient understands and accepts the medical plan as it's been dictated and I have answered their questions. Discharge instructions concerning home care and prescriptions have been given. The patient is STABLE and is discharged to home in good condition.  BP 123/54 mmHg  Pulse 86  Temp(Src) 98.1 F (36.7 C) (Oral)  Resp 18  SpO2 96%  Meds ordered this encounter  Medications  . pantoprazole (PROTONIX) injection 40 mg    Sig:   . DISCONTD: morphine 4 MG/ML injection 4 mg    Sig:   . DISCONTD: ondansetron (ZOFRAN) injection 4 mg    Sig:   . sodium chloride 0.9 % bolus 1,000 mL    Sig:   . morphine 4 MG/ML injection 4 mg    Sig:   . ondansetron (ZOFRAN) injection 4 mg    Sig:   . potassium chloride SA (K-DUR,KLOR-CON) CR tablet 40 mEq    Sig:   . gi cocktail (Maalox,Lidocaine,Donnatal)    Sig:   . ondansetron (ZOFRAN ODT) 4 MG disintegrating tablet    Sig: Take 1 tablet (4 mg total) by mouth every 8 (eight) hours as needed for nausea or vomiting.    Dispense:  15 tablet    Refill:  0    Order Specific Question:  Supervising Provider    Answer:  MILLER, BRIAN [[id number]]  . pantoprazole (PROTONIX) 20 MG tablet    Sig: Take 1 tablet (20 mg total) by mouth daily.    Dispense:  30 tablet    Refill:  0    Order Specific Question:  Supervising Provider    Answer:  Noemi Chapel [[id number]]     Aliz Meritt Camprubi-Soms, PA-C 05/04/15 2111  Courteney Julio Alm, MD 05/04/15 2322

## 2015-05-04 NOTE — ED Notes (Signed)
Pt c/o generalized abdominal pain and nausea x 1 day.  Pain score 10/10.  Pt reports taking Carafate that was prescribed in July w/o relief.  Sts "I thought that might help."  Hx of gastritis and pancreatitis.  Sts "drinking Coca Cola helped a little."

## 2015-05-04 NOTE — ED Notes (Signed)
Ultrasound at bedside

## 2015-05-09 ENCOUNTER — Ambulatory Visit: Payer: Medicare Other | Admitting: Family Medicine

## 2015-05-10 ENCOUNTER — Encounter: Payer: Self-pay | Admitting: Pharmacist

## 2015-05-10 ENCOUNTER — Ambulatory Visit: Payer: Medicare Other | Attending: Family Medicine | Admitting: Pharmacist

## 2015-05-10 DIAGNOSIS — Z0000 Encounter for general adult medical examination without abnormal findings: Secondary | ICD-10-CM | POA: Diagnosis not present

## 2015-05-10 DIAGNOSIS — Z23 Encounter for immunization: Secondary | ICD-10-CM | POA: Insufficient documentation

## 2015-05-10 NOTE — Progress Notes (Signed)
Patient here for her annual flu vaccine

## 2015-05-30 ENCOUNTER — Other Ambulatory Visit: Payer: Self-pay | Admitting: *Deleted

## 2015-06-01 ENCOUNTER — Telehealth: Payer: Self-pay | Admitting: Family Medicine

## 2015-06-01 ENCOUNTER — Other Ambulatory Visit: Payer: Self-pay | Admitting: Family Medicine

## 2015-06-01 DIAGNOSIS — E119 Type 2 diabetes mellitus without complications: Secondary | ICD-10-CM

## 2015-06-01 DIAGNOSIS — I10 Essential (primary) hypertension: Secondary | ICD-10-CM

## 2015-06-01 MED ORDER — GLIMEPIRIDE 4 MG PO TABS: 4.0000 mg | ORAL_TABLET | Freq: Every day | ORAL | Status: DC

## 2015-06-01 MED ORDER — HYDROCHLOROTHIAZIDE 25 MG PO TABS: 25.0000 mg | ORAL_TABLET | Freq: Every day | ORAL | Status: DC

## 2015-06-01 NOTE — Telephone Encounter (Signed)
Please inform patient HCTZ has been refilled 25 mg is preferred max dose, so 25 mg refilled She is due for f/u OV with new PCP

## 2015-06-02 NOTE — Telephone Encounter (Signed)
Pt.notified

## 2015-06-29 ENCOUNTER — Emergency Department (HOSPITAL_COMMUNITY): Payer: Medicare Other

## 2015-06-29 ENCOUNTER — Emergency Department (HOSPITAL_COMMUNITY)
Admission: EM | Admit: 2015-06-29 | Discharge: 2015-06-29 | Disposition: A | Discharge status: Home / Self Care | Payer: Medicare Other | Attending: Emergency Medicine | Admitting: Emergency Medicine

## 2015-06-29 ENCOUNTER — Encounter (HOSPITAL_COMMUNITY): Payer: Self-pay | Admitting: *Deleted

## 2015-06-29 DIAGNOSIS — E119 Type 2 diabetes mellitus without complications: Secondary | ICD-10-CM | POA: Diagnosis not present

## 2015-06-29 DIAGNOSIS — I509 Heart failure, unspecified: Secondary | ICD-10-CM | POA: Insufficient documentation

## 2015-06-29 DIAGNOSIS — Z79899 Other long term (current) drug therapy: Secondary | ICD-10-CM | POA: Diagnosis not present

## 2015-06-29 DIAGNOSIS — R059 Cough, unspecified: Secondary | ICD-10-CM

## 2015-06-29 DIAGNOSIS — Z86718 Personal history of other venous thrombosis and embolism: Secondary | ICD-10-CM | POA: Diagnosis not present

## 2015-06-29 DIAGNOSIS — E669 Obesity, unspecified: Secondary | ICD-10-CM | POA: Diagnosis not present

## 2015-06-29 DIAGNOSIS — F17210 Nicotine dependence, cigarettes, uncomplicated: Secondary | ICD-10-CM | POA: Insufficient documentation

## 2015-06-29 DIAGNOSIS — R079 Chest pain, unspecified: Secondary | ICD-10-CM

## 2015-06-29 DIAGNOSIS — Z8669 Personal history of other diseases of the nervous system and sense organs: Secondary | ICD-10-CM | POA: Diagnosis not present

## 2015-06-29 DIAGNOSIS — K219 Gastro-esophageal reflux disease without esophagitis: Secondary | ICD-10-CM | POA: Insufficient documentation

## 2015-06-29 DIAGNOSIS — I10 Essential (primary) hypertension: Secondary | ICD-10-CM | POA: Diagnosis not present

## 2015-06-29 DIAGNOSIS — Z792 Long term (current) use of antibiotics: Secondary | ICD-10-CM | POA: Insufficient documentation

## 2015-06-29 DIAGNOSIS — E876 Hypokalemia: Secondary | ICD-10-CM | POA: Diagnosis not present

## 2015-06-29 DIAGNOSIS — J441 Chronic obstructive pulmonary disease with (acute) exacerbation: Secondary | ICD-10-CM | POA: Diagnosis not present

## 2015-06-29 DIAGNOSIS — Z880 Allergy status to penicillin: Secondary | ICD-10-CM | POA: Diagnosis not present

## 2015-06-29 DIAGNOSIS — Z86018 Personal history of other benign neoplasm: Secondary | ICD-10-CM | POA: Insufficient documentation

## 2015-06-29 DIAGNOSIS — R05 Cough: Secondary | ICD-10-CM

## 2015-06-29 DIAGNOSIS — Z791 Long term (current) use of non-steroidal anti-inflammatories (NSAID): Secondary | ICD-10-CM | POA: Insufficient documentation

## 2015-06-29 LAB — I-STAT TROPONIN, ED
TROPONIN I, POC: 0 ng/mL (ref 0.00–0.08)
Troponin i, poc: 0.01 ng/mL (ref 0.00–0.08)

## 2015-06-29 LAB — CBC
HEMATOCRIT: 41.9 % (ref 36.0–46.0)
HEMOGLOBIN: 14.3 g/dL (ref 12.0–15.0)
MCH: 26.9 pg (ref 26.0–34.0)
MCHC: 34.1 g/dL (ref 30.0–36.0)
MCV: 78.9 fL (ref 78.0–100.0)
Platelets: 237 10*3/uL (ref 150–400)
RBC: 5.31 MIL/uL — AB (ref 3.87–5.11)
RDW: 15.2 % (ref 11.5–15.5)
WBC: 4.9 10*3/uL (ref 4.0–10.5)

## 2015-06-29 LAB — BASIC METABOLIC PANEL
Anion gap: 13 (ref 5–15)
BUN: 10 mg/dL (ref 6–20)
CALCIUM: 9.6 mg/dL (ref 8.9–10.3)
CO2: 29 mmol/L (ref 22–32)
Chloride: 93 mmol/L — ABNORMAL LOW (ref 101–111)
Creatinine, Ser: 0.78 mg/dL (ref 0.44–1.00)
GFR calc Af Amer: >60 mL/min (ref >60)
GFR calc non Af Amer: >60 mL/min (ref >60)
GLUCOSE: 146 mg/dL — AB (ref 65–99)
POTASSIUM: 2.9 mmol/L — AB (ref 3.5–5.1)
Sodium: 135 mmol/L (ref 135–145)

## 2015-06-29 LAB — POTASSIUM: POTASSIUM: 2.6 mmol/L — AB (ref 3.5–5.1)

## 2015-06-29 MED ORDER — AEROCHAMBER PLUS W/MASK MISC
1.0000 | Freq: Once | Status: DC
  Filled 2015-06-29: qty 1

## 2015-06-29 MED ORDER — ALBUTEROL SULFATE HFA 108 (90 BASE) MCG/ACT IN AERS: 2.0000 | INHALATION_SPRAY | RESPIRATORY_TRACT | Status: DC | PRN

## 2015-06-29 MED ORDER — ONDANSETRON 4 MG PO TBDP
4.0000 mg | ORAL_TABLET | Freq: Once | ORAL | Status: DC
  Filled 2015-06-29: qty 1

## 2015-06-29 MED ORDER — HYDROCODONE-ACETAMINOPHEN 5-325 MG PO TABS: 1.0000 | ORAL_TABLET | Freq: Four times a day (QID) | ORAL | Status: DC | PRN

## 2015-06-29 MED ORDER — POTASSIUM CHLORIDE 10 MEQ/100ML IV SOLN
10.0000 meq | Freq: Once | INTRAVENOUS | Status: AC
  Administered 2015-06-29: 10 meq via INTRAVENOUS
  Filled 2015-06-29: qty 100

## 2015-06-29 MED ORDER — ALBUTEROL SULFATE HFA 108 (90 BASE) MCG/ACT IN AERS
2.0000 | INHALATION_SPRAY | RESPIRATORY_TRACT | Status: DC | PRN
  Filled 2015-06-29: qty 6.7

## 2015-06-29 MED ORDER — ALBUTEROL (5 MG/ML) CONTINUOUS INHALATION SOLN
10.0000 mg/h | INHALATION_SOLUTION | Freq: Once | RESPIRATORY_TRACT | Status: AC
  Administered 2015-06-29: 10 mg/h via RESPIRATORY_TRACT
  Filled 2015-06-29: qty 20

## 2015-06-29 MED ORDER — PREDNISONE 20 MG PO TABS: 40.0000 mg | ORAL_TABLET | Freq: Every day | ORAL | Status: DC

## 2015-06-29 MED ORDER — POTASSIUM CHLORIDE CRYS ER 20 MEQ PO TBCR
40.0000 meq | EXTENDED_RELEASE_TABLET | Freq: Once | ORAL | Status: AC
  Administered 2015-06-29: 40 meq via ORAL
  Filled 2015-06-29: qty 2

## 2015-06-29 MED ORDER — BECLOMETHASONE DIPROPIONATE 40 MCG/ACT IN AERS: 2.0000 | INHALATION_SPRAY | Freq: Two times a day (BID) | RESPIRATORY_TRACT | Status: DC

## 2015-06-29 MED ORDER — HYDROCODONE-ACETAMINOPHEN 5-325 MG PO TABS
1.0000 | ORAL_TABLET | Freq: Once | ORAL | Status: AC
  Administered 2015-06-29: 1 via ORAL
  Filled 2015-06-29: qty 1

## 2015-06-29 MED ORDER — BENZONATATE 100 MG PO CAPS: 100.0000 mg | ORAL_CAPSULE | Freq: Three times a day (TID) | ORAL | Status: DC

## 2015-06-29 MED ORDER — POTASSIUM CHLORIDE CRYS ER 20 MEQ PO TBCR: 20.0000 meq | EXTENDED_RELEASE_TABLET | Freq: Every day | ORAL | Status: DC

## 2015-06-29 MED ORDER — POTASSIUM CHLORIDE 10 MEQ/100ML IV SOLN: 10.0000 meq | INTRAVENOUS | Status: DC

## 2015-06-29 MED ORDER — PREDNISONE 20 MG PO TABS
60.0000 mg | ORAL_TABLET | Freq: Once | ORAL | Status: AC
  Administered 2015-06-29: 60 mg via ORAL
  Filled 2015-06-29: qty 3

## 2015-06-29 NOTE — ED Provider Notes (Signed)
CSN: [id number]     Arrival date & time 06/29/15  1727 History   First MD Initiated Contact with Patient 06/29/15 1844     Chief Complaint  Patient presents with  . Chest Pain    HPI   Sarah [name] is an 60 y.o. female with history of COPD, CHF, DM, HTN, GERD who presents to the ED for evaluation of chest pain. She states she has had a cough productive of yellow sputum for the past 2-3 days that is increased from baseline. States that this morning she started feeling a vague mild pain on the left side of her chest underneath her left breast. She states that she was in the middle of a coughing fit when the pain suddenly intensified to 10/10. She states the pain radiates up the left side of her neck and to her arms bilaterally. She reports numbness/tingling in her hands. She states that this has never happened before. She denies SOB increased from baseline.   Past Medical History  Diagnosis Date  . Hypertension   . Gastritis     01-13-2015  per ED note  . GERD (gastroesophageal reflux disease)   . Headache   . Arthritis   . COPD (chronic obstructive pulmonary disease) ([place])   . Type 2 diabetes mellitus ([place])   . History of pulmonary edema     mild -  07-22-2014  post-op  surgery 07-20-2014  . History of acute pancreatitis     01-04-2013   . Acute pancreatitis     01-13-2015 per ED note  . Adrenal adenoma     bilateral  . Ulnar nerve compression     right elbow  . History of DVT of lower extremity     2000  post op left knee surgery  . History of acute congestive heart failure     07-22-2014   post-op surgery 07-20-2014  . Complication of anesthesia     post-op 07-20-2014 surgery post-op Hypoxia due to acute CHF/ mild Pulmonary edema  . Bilateral lower extremity edema   . History of alcohol abuse     state quit 2002/  drank daily >fifth of gin   Past Surgical History  Procedure Laterality Date  . Dilation and curettage of uterus    . Ventral hernia repair N/A 07/20/2014   Procedure: LAPAROSCOPIC REPAIR INCARCARATED VENTRAL HERNIA AND UMBILICAL HERNIA;  Surgeon: Fanny Skates, MD;  Location: [facility];  Service: General;  Laterality: N/A;  . Insertion of mesh N/A 07/20/2014    Procedure: INSERTION OF MESH;  Surgeon: Fanny Skates, MD;  Location: [facility];  Service: General;  Laterality: N/A;  . Knee arthroscopy Left 2000  . Total abdominal hysterectomy w/ bilateral salpingoophorectomy  1995  . Transthoracic echocardiogram  07-22-2014    mild LVH,  60-65%,  grade I diastolic dysfunction/  mild LAE   History reviewed. No pertinent family history. Social History  Substance Use Topics  . Smoking status: Current Every Day Smoker -- 1.50 packs/day for 40 years    Types: Cigarettes  . Smokeless tobacco: Never Used  . Alcohol Use: No     Comment: hx alcohol abuse- drank >fifth of gin daily/  states Quit 2002   OB History    No data available     Review of Systems  All other systems reviewed and are negative.     Allergies  Aspirin; Ibuprofen; and Penicillins  Home Medications   Prior to Admission medications   Medication Sig Start Date End  Date Taking? Authorizing Provider  Acetaminophen-Aspirin Buffered (EXCEDRIN BACK & BODY PO) Take 1 tablet by mouth 2 (two) times daily as needed (pain).    Historical Provider, MD  albuterol (PROVENTIL HFA;VENTOLIN HFA) 108 (90 BASE) MCG/ACT inhaler Inhale 2 puffs into the lungs every 6 (six) hours as needed for wheezing.    Historical Provider, MD  allopurinol (ZYLOPRIM) 100 MG tablet Take 1 tablet (100 mg total) by mouth daily. 05/02/15   Josalyn Funches, MD  amLODipine (NORVASC) 10 MG tablet Take 1 tablet (10 mg total) by mouth daily. 03/02/15   Josalyn Funches, MD  docusate sodium (COLACE) 100 MG capsule Take 1 capsule (100 mg total) by mouth every 12 (twelve) hours. 01/13/15   Malvin Johns, MD  glimepiride (AMARYL) 4 MG tablet Take 1 tablet (4 mg total) by mouth daily with breakfast. D/c 1 mg amaryl. OV visit needed for  additional refills 06/01/15   Boykin Nearing, MD  hydrochlorothiazide (HYDRODIURIL) 25 MG tablet Take 1 tablet (25 mg total) by mouth daily. D/c 50 mg HCTZ 06/01/15   Josalyn Funches, MD  HYDROcodone-acetaminophen (NORCO/VICODIN) 5-325 MG per tablet Take 1 tablet by mouth every 4 (four) hours as needed for moderate pain. 07/27/14   Fanny Skates, MD  lisinopril (PRINIVIL,ZESTRIL) 5 MG tablet Take 1 tablet (5 mg total) by mouth every morning. 11/22/14   Lorayne Marek, MD  magnesium hydroxide (MILK OF MAGNESIA) 400 MG/5ML suspension Take 30 mLs by mouth daily as needed for mild constipation.    Historical Provider, MD  meclizine (ANTIVERT) 25 MG tablet Take 1 tablet (25 mg total) by mouth 3 (three) times daily as needed for dizziness. 07/06/14   Veryl Speak, MD  meloxicam (MOBIC) 7.5 MG tablet Take 1 tablet (7.5 mg total) by mouth daily. 05/02/15   Boykin Nearing, MD  metFORMIN (GLUCOPHAGE) 500 MG tablet Take 1 tablet (500 mg total) by mouth daily with breakfast. 11/22/14   Lorayne Marek, MD  nicotine (NICODERM CQ) 21 mg/24hr patch Place 1 patch (21 mg total) onto the skin daily. 11/22/14   Lorayne Marek, MD  ofloxacin (OCUFLOX) 0.3 % ophthalmic solution Place 1 drop into both eyes 4 (four) times daily. 09/16/14   Lorayne Marek, MD  olopatadine (PATANOL) 0.1 % ophthalmic solution Place 1 drop into both eyes daily as needed. 04/05/15   Historical Provider, MD  ondansetron (ZOFRAN ODT) 4 MG disintegrating tablet Take 1 tablet (4 mg total) by mouth every 8 (eight) hours as needed for nausea or vomiting. 05/04/15   Mercedes Camprubi-Soms, PA-C  pantoprazole (PROTONIX) 20 MG tablet Take 1 tablet (20 mg total) by mouth daily. 03/02/15   Josalyn Funches, MD  pantoprazole (PROTONIX) 20 MG tablet Take 1 tablet (20 mg total) by mouth daily. 05/04/15   Mercedes Camprubi-Soms, PA-C  sucralfate (CARAFATE) 1 G tablet Take 1 tablet (1 g total) by mouth 4 (four) times daily -  with meals and at bedtime. 01/13/15   Malvin Johns, MD   BP 134/75 mmHg  Pulse 98  Temp(Src) 99.4 F (37.4 C) (Oral)  Resp 18  SpO2 97% Physical Exam  Constitutional: She is oriented to person, place, and time.  Obese, intermittent productive cough, NAD  HENT:  Right Ear: External ear normal.  Left Ear: External ear normal.  Nose: Nose normal.  Mouth/Throat: Oropharynx is clear and moist. No oropharyngeal exudate.  Eyes: Conjunctivae and EOM are normal. Pupils are equal, round, and reactive to light.  Neck: Normal range of motion. Neck supple.  Cardiovascular: Normal  rate, regular rhythm, normal heart sounds and intact distal pulses.   Pulmonary/Chest: Effort normal. No respiratory distress. She exhibits tenderness.    Bilateral lower lung bases with decreased breath sounds. Couple soft end-expiratory wheezes bilaterally. No rhonchi or rales.  Abdominal: Soft. Bowel sounds are normal. She exhibits no distension. There is no tenderness.  Musculoskeletal: She exhibits no edema.  Neurological: She is alert and oriented to person, place, and time. No cranial nerve deficit.  Skin: Skin is warm and dry.  Psychiatric: She has a normal mood and affect.  Nursing note and vitals reviewed.   ED Course  Procedures (including critical care time) Labs Review Labs Reviewed  BASIC METABOLIC PANEL - Abnormal; Notable for the following:    Potassium 2.9 (*)    Chloride 93 (*)    Glucose, Bld 146 (*)    All other components within normal limits  CBC - Abnormal; Notable for the following:    RBC 5.31 (*)    All other components within normal limits  POTASSIUM - Abnormal; Notable for the following:    Potassium 2.6 (*)    All other components within normal limits  I-STAT TROPOININ, ED  I-STAT TROPOININ, ED    Imaging Review Dg Chest 2 View  06/29/2015  CLINICAL DATA:  Left sided chest pain x 7 hours with pain radiating into left jaw and left humerus accompanied with numbness in both hands. Productive cough 2-3 days - dark brown  phlegm in early AM, then yellowish green. Also pain around bila.*comment was truncated*. 60 pack-year smoking history. EXAM: CHEST  2 VIEW COMPARISON:  01/05/2015 FINDINGS: Lateral view degraded by patient arm position. Midline trachea. Normal heart size and mediastinal contours. No pleural effusion or pneumothorax. Moderate pulmonary interstitial thickening. No lobar consolidation. Lingular scarring. IMPRESSION: 1.  No acute cardiopulmonary disease. 2. Moderate peribronchial thickening which may relate to chronic bronchitis or smoking. Electronically Signed   By: Abigail Miyamoto M.D.   On: 06/29/2015 18:15   I have personally reviewed and evaluated these images and lab results as part of my medical decision-making.   EKG Interpretation   Date/Time:  Wednesday June 29 2015 17:27:34 EST Ventricular Rate:  98 PR Interval:  138 QRS Duration: 84 QT Interval:  366 QTC Calculation: 467 R Axis:   52 Text Interpretation:  Sinus rhythm with Premature atrial complexes Low  voltage QRS Borderline ECG since last tracing no significant change  Confirmed by Eulis Foster  MD, ELLIOTT [id number]) on 06/29/2015 7:17:12 PM      MDM   Final diagnoses:  None    HEART score 3. Will treat pain symptomatically. Will delta troponin. Not hypertensive. Low suspicion for dissection. Likely musculoskeletal pain from coughing/bronchitis/COPD exacerbation. Prednisone and neb treatment given for bronchitis. Anticipate d/c home after delta troponin negative with rx for prednisone, albuterol, mucinex, and tessalon. Will have to sign out to PA-C HM at end of shift.     Anne Ng, PA-C 06/29/15 2259  Daleen Bo, MD 06/30/15 [phone]

## 2015-06-29 NOTE — ED Provider Notes (Signed)
Care assumed from [facility], [place].    Sarah [name] is a 60 y.o. female with a hx of CHF, COPD presents with vague CP onset this AM with worsening after coughing episode.  Pt with associated radiation into her neck and arms.  Pt also c/o productive cough.  Pt   Physical Exam  BP 134/85 mmHg  Pulse 85  Temp(Src) 99.4 F (37.4 C) (Oral)  Resp 18  SpO2 97%  Physical Exam   Face to face Exam:   General: Awake  HEENT: Atraumatic  Resp: Normal effort  Pulm/Chest: Reproducible pain over the left chest with palpation Cardiac: Tachycardia, no murmur Abd: Nondistended  Neuro:No focal weakness  Lymph: No adenopathy Skin: No rash  Results for orders placed or performed during the hospital encounter of [id number]  Basic metabolic panel  Result Value Ref Range   Sodium 135 135 - 145 mmol/L   Potassium 2.9 (L) 3.5 - 5.1 mmol/L   Chloride 93 (L) 101 - 111 mmol/L   CO2 29 22 - 32 mmol/L   Glucose, Bld 146 (H) 65 - 99 mg/dL   BUN 10 6 - 20 mg/dL   Creatinine, Ser 0.78 0.44 - 1.00 mg/dL   Calcium 9.6 8.9 - 10.3 mg/dL   GFR calc non Af Amer >60 >60 mL/min   GFR calc Af Amer >60 >60 mL/min   Anion gap 13 5 - 15  CBC  Result Value Ref Range   WBC 4.9 4.0 - 10.5 K/uL   RBC 5.31 (H) 3.87 - 5.11 MIL/uL   Hemoglobin 14.3 12.0 - 15.0 g/dL   HCT 41.9 36.0 - 46.0 %   MCV 78.9 78.0 - 100.0 fL   MCH 26.9 26.0 - 34.0 pg   MCHC 34.1 30.0 - 36.0 g/dL   RDW 15.2 11.5 - 15.5 %   Platelets 237 150 - 400 K/uL  Potassium  Result Value Ref Range   Potassium 2.6 (LL) 3.5 - 5.1 mmol/L  I-stat troponin, ED (not at [facility])  Result Value Ref Range   Troponin i, poc 0.00 0.00 - 0.08 ng/mL   Comment 3          I-stat troponin, ED  Result Value Ref Range   Troponin i, poc 0.01 0.00 - 0.08 ng/mL   Comment 3           Dg Chest 2 View  06/29/2015  CLINICAL DATA:  Left sided chest pain x 7 hours with pain radiating into left jaw and left humerus accompanied with numbness in both hands. Productive  cough 2-3 days - dark brown phlegm in early AM, then yellowish green. Also pain around bila.*comment was truncated*. 60 pack-year smoking history. EXAM: CHEST  2 VIEW COMPARISON:  01/05/2015 FINDINGS: Lateral view degraded by patient arm position. Midline trachea. Normal heart size and mediastinal contours. No pleural effusion or pneumothorax. Moderate pulmonary interstitial thickening. No lobar consolidation. Lingular scarring. IMPRESSION: 1.  No acute cardiopulmonary disease. 2. Moderate peribronchial thickening which may relate to chronic bronchitis or smoking. Electronically Signed   By: Abigail Miyamoto M.D.   On: 06/29/2015 18:15    EKG Interpretation  Date/Time:  Wednesday June 29 2015 17:27:34 EST Ventricular Rate:  98 PR Interval:  138 QRS Duration: 84 QT Interval:  366 QTC Calculation: 467 R Axis:   52 Text Interpretation:  Sinus rhythm with Premature atrial complexes Low voltage QRS Borderline ECG since last tracing no significant change Confirmed by Eulis Foster  MD, ELLIOTT [phone]) on 06/29/2015  7:17:12 PM            ED Course  Procedures  1. COPD exacerbation ([place])   2. Cough   3. Chest pain, unspecified chest pain type   4. Hypokalemia   5. Essential hypertension     MDM  Plan: Pt receiving potassium, albuterol and steroids.  Plan for delta trop at 9pm.  If negative pt may be d/c home with symptomatic treatment.    Heart Score 3.  11:19 PM Pt reports she is feeling better. She has clear and equal breath sounds.  Pt's repeat potassium is 2.6; she refused the IV potassium and her hypokalemia was likely worsened by her continuous albuterol treatment. She reports taking her HCTZ as directed, likely the initial cause of her hypokalemia.  Pt was given a 2nd dose of Klor Con 40mg  here in the ED.  She will be discharged with 3 days of potassium supplementation and strict instructions for PCP follow-up and repeat labs.    Pt reports she is CP free at this time.  She is noted to be  tachycardic after the continuous nebulizer but without hypoxia while ambulating.  No accessory muscle usage.  Delta trop was negative and ECG was without ischemia.  Pt requests d/c home.  No evidence of PNA on her CXR.  She will be d/c with albuterol MDI, prednisone, tessalon.    BP 140/65 mmHg  Pulse 120  Temp(Src) 99.4 F (37.4 C) (Oral)  Resp 20  SpO2 93%  The patient was discussed with Dr. Eulis Foster who agrees with the treatment plan.    Jarrett Soho Advait Buice, PA-C 06/29/15 2322  Daleen Bo, MD 06/30/15 0100  Daleen Bo, MD 06/30/15 [phone]

## 2015-06-29 NOTE — ED Notes (Signed)
Called in to room by pt's granddaughter, pt was complaining that the IV was burning and "it was too much, make it stop". Educated pt that IV potassium can be irritating to the veins. Notified [place], [place]. PA ok with stopping IV potassium at this time.

## 2015-06-29 NOTE — ED Notes (Signed)
Pt given albuterol inhaler and spacer. Educated on the use both. Pt verbalized understanding

## 2015-06-29 NOTE — ED Provider Notes (Signed)
  Face-to-face evaluation   History: Here for evaluation of chest soreness after a coughing episode earlier today. Chronic cough, worse in morning related to smoking.  Physical exam: Appears older than stated age. Congested cough noted during exam. Heart regular rate and murmur. Lungs decreased bilaterally with scattered rhonchi and wheezing.  Medical screening examination/treatment/procedure(s) were conducted as a shared visit with non-physician practitioner(s) and myself.  I personally evaluated the patient during the encounter  Daleen Bo, MD 06/30/15 [phone]

## 2015-06-29 NOTE — ED Notes (Addendum)
Pt reports having left side sharp chest pains x 6 hours, has pain in left upper arm and radiates into her neck. Reports when she puts her arms down, she gets tingling in bilateral hands. ekg done on arrival. Productive cough noted at triage.

## 2015-06-29 NOTE — ED Notes (Signed)
Pt given albuterol inhaler and spacer chamber prior to discharge.

## 2015-06-29 NOTE — ED Notes (Signed)
PA at bedside.

## 2015-06-29 NOTE — Discharge Instructions (Signed)
1. Medications: Albuterol, Qvar, prednisone, OTC mucinex, tessalon, potassium, Vicodin for severe pain only usual home medications 2. Treatment: rest, drink plenty of fluids, take tylenol for fever control 3. Follow Up: Please followup with your primary doctor in 3 days for discussion of your diagnoses and further evaluation after today's visit; if you do not have a primary care doctor use the resource guide provided to find one; Return to the ER for high fevers, difficulty breathing or other concerning symptoms    Asthma Attack Prevention While you may not be able to control the fact that you have asthma, you can take actions to prevent asthma attacks. The best way to prevent asthma attacks is to maintain good control of your asthma. You can achieve this by:  Taking your medicines as directed.  Avoiding things that can irritate your airways or make your asthma symptoms worse (asthma triggers).  Keeping track of how well your asthma is controlled and of any changes in your symptoms.  Responding quickly to worsening asthma symptoms (asthma attack).  Seeking emergency care when it is needed. WHAT ARE SOME WAYS TO PREVENT AN ASTHMA ATTACK? Have a Plan Work with your health care provider to create a written plan for managing and treating your asthma attacks (asthma action plan). This plan includes:  A list of your asthma triggers and how you can avoid them.  Information on when medicines should be taken and when their dosages should be changed.  The use of a device that measures how well your lungs are working (peak flow meter). Monitor Your Asthma Use your peak flow meter and record your results in a journal every day. A drop in your peak flow numbers on one or more days may indicate the start of an asthma attack. This can happen even before you start to feel symptoms. You can prevent an asthma attack from getting worse by following the steps in your asthma action plan. Avoid Asthma  Triggers Work with your asthma health care provider to find out what your asthma triggers are. This can be done by:  Allergy testing.  Keeping a journal that notes when asthma attacks occur and the factors that may have contributed to them.  Determining if there are other medical conditions that are making your asthma worse. Once you have determined your asthma triggers, take steps to avoid them. This may include avoiding excessive or prolonged exposure to:  Dust. Have someone dust and vacuum your home for you once or twice a week. Using a high-efficiency particulate arrestance (HEPA) vacuum is best.  Smoke. This includes campfire smoke, forest fire smoke, and secondhand smoke from tobacco products.  Pet dander. Avoid contact with animals that you know you are allergic to.  Allergens from trees, grasses or pollens. Avoid spending a lot of time outdoors when pollen counts are high, and on very windy days.  Very cold, dry, or humid air.  Mold.  Foods that contain high amounts of sulfites.  Strong odors.  Outdoor air pollutants, such as Lexicographer.  Indoor air pollutants, such as aerosol sprays and fumes from household cleaners.  Household pests, including dust mites and cockroaches, and pest droppings.  Certain medicines, including NSAIDs. Always talk to your health care provider before stopping or starting any new medicines. Medicines Take over-the-counter and prescription medicines only as told by your health care provider. Many asthma attacks can be prevented by carefully following your medicine schedule. Taking your medicines correctly is especially important when you cannot avoid certain  asthma triggers. Act Quickly If an asthma attack does happen, acting quickly can decrease how severe it is and how long it lasts. Take these steps:   Pay attention to your symptoms. If you are coughing, wheezing, or having difficulty breathing, do not wait to see if your symptoms go away  on their own. Follow your asthma action plan.  If you have followed your asthma action plan and your symptoms are not improving, call your health care provider or seek immediate medical care at the nearest hospital. It is important to note how often you need to use your fast-acting rescue inhaler. If you are using your rescue inhaler more often, it may mean that your asthma is not under control. Adjusting your asthma treatment plan may help you to prevent future asthma attacks and help you to gain better control of your condition. HOW CAN I PREVENT AN ASTHMA ATTACK WHEN I EXERCISE? Follow advice from your health care provider about whether you should use your fast-acting inhaler before exercising. Many people with asthma experience exercise-induced bronchoconstriction (EIB). This condition often worsens during vigorous exercise in cold, humid, or dry environments. Usually, people with EIB can stay very active by pre-treating with a fast-acting inhaler before exercising.   This information is not intended to replace advice given to you by your health care provider. Make sure you discuss any questions you have with your health care provider.   Document Released: 05/23/2009 Document Revised: 02/23/2015 Document Reviewed: 11/04/2014 Elsevier Interactive Patient Education 2016 [employer].    Emergency Department Resource Guide 1) Find a Doctor and Pay Out of Pocket Although you won't have to find out who is covered by your insurance plan, it is a good idea to ask around and get recommendations. You will then need to call the office and see if the doctor you have chosen will accept you as a new patient and what types of options they offer for patients who are self-pay. Some doctors offer discounts or will set up payment plans for their patients who do not have insurance, but you will need to ask so you aren't surprised when you get to your appointment.  2) Contact Your Local Health Department Not all  health departments have doctors that can see patients for sick visits, but many do, so it is worth a call to see if yours does. If you don't know where your local health department is, you can check in your phone book. The CDC also has a tool to help you locate your state's health department, and many state websites also have listings of all of their local health departments.  3) Find a [place] [facility] If your illness is not likely to be very severe or complicated, you may want to try a walk in clinic. These are popping up all over the country in pharmacies, drugstores, and shopping centers. They're usually staffed by nurse practitioners or physician assistants that have been trained to treat common illnesses and complaints. They're usually fairly quick and inexpensive. However, if you have serious medical issues or chronic medical problems, these are probably not your best option.  No Primary Care Doctor: - Call Health Connect at  [phone] - they can help you locate a primary care doctor that  accepts your insurance, provides certain services, etc. - Physician Referral Service- [phone]  Chronic Pain Problems: Organization         Address  Phone   Notes  [facility]  [phone] Patients need  to be referred by their primary care doctor.   Medication Assistance: Organization         Address  Phone   Notes  [facility] Medication [facility] [address] [place], [place] [address] [phone] --Must be a resident of [facility] -- Must have NO insurance coverage whatsoever (no Medicaid/ Medicare, etc.) -- The pt. MUST have a primary care doctor that directs their care regularly and follows them in the community   MedAssist  [phone]   [employer]  [phone]    Agencies that provide inexpensive medical care: Organization         Address  Phone   Notes  [place]  [phone]   [facility]     [phone]   [facility] [place], [place] [address] [phone]   [place] [address], [place] [phone]   Planned Parenthood    [phone]   [facility]    [phone]   [place] and [place] [address], [place] Phone:  [phone], Fax:  [phone] Hours of Operation:  9 am - 6 pm, M-F.  Also accepts Medicaid/Medicare and self-pay.  [facility] for [place], [address], [place] Phone: [phone], Fax: [phone]. Hours of Operation:  8:30 am - 5:30 pm, M-F.  Also accepts Medicaid and self-pay.  [facility] [place] [address] [place] Phone: [phone]   [place], [place], [place] [phone], Ext. [phone] Mondays & Thursdays: 7-9 AM.  First 15 patients are seen on a first come, first serve basis.    [place] Providers:  Organization         Address  Phone   Notes  [facility] [address], [place] [phone] Also accepts self-pay patients.  [facility] [id number] [place], [place]  [phone]   [place], [address], [place] [phone]   [facility] [address], [place] [phone]   Lucianne Lei [address], [place]   [phone] Only accepts [place] Access [place] patients after they have their name applied to their card.   Self-Pay (no insurance) in [facility]:  Organization         Address  Phone   Notes  Sickle Cell Patients, [facility] [place] [phone]   [facility] [place] [phone]   [facility] [place]  [place], [place], [place] [phone]     [facility]/Dr. Osei-Bonsu  [address], [place] or [address], [place] [phone] Phone number for both [place] and [place] locations is the same.  Urgent Medical and [facility] [address] [place] [phone]   [facility] [address] [place] or [address] [phone]   [facility] [address], [place] [phone], phone; [phone], fax Sees patients 1st and 3rd Saturday of every month.  Must not qualify for public or private insurance (i.e. Medicaid, Medicare, [place] Health Choice, Veterans' Benefits)  Household income  should be no more than 200% of the poverty level The clinic cannot treat you if you are pregnant or think you are pregnant  Sexually transmitted diseases are not treated at the clinic.    Dental Care: Organization         Address  Phone  Notes  [facility] Department of [facility] [place] [phone] Accepts children up to age 65 who are enrolled in [place] or [place]; pregnant women with a Medicaid card; and children who have applied for Medicaid or [place] Health Choice, but were declined, whose parents can pay a reduced fee at time of service.  [facility] Department of [facility]  [address], [place] [phone] Accepts children up to age 66 who are enrolled in [place] or [place]; pregnant women with a Medicaid card; and children who have applied for Medicaid or [place] Health Choice, but were declined, whose parents can pay a reduced fee at time of service.  [place] Adult Dental Access PROGRAM  [place] [phone] Patients are seen by appointment only. Walk-ins are not accepted. [place] will see patients 52 years of age and older. Monday - Tuesday (8am-5pm) Most Wednesdays (8:30-5pm) $30 per visit,  cash only  [facility] Adult Dental Access PROGRAM  [address], [facility] [phone] Patients are seen by appointment only. Walk-ins are not accepted. [place] will see patients 15 years of age and older. One Wednesday Evening (Monthly: Volunteer Based).  $30 per visit, cash only  [place]  [phone] for adults; Children under age 39, call Graduate Pediatric Dentistry at [phone]. Children aged 26-14, please call [phone] to request a pediatric application.  Dental services are provided in all areas of dental care including fillings, crowns and bridges, complete and partial dentures, implants, gum treatment, root canals, and extractions. Preventive care is also provided. Treatment is provided to both adults and children. Patients are selected via a lottery and there is often a waiting list.   [facility] [address] [place]  [phone] [email or web address]   [facility] [address] [place], [place] [phone], Ext. [phone] Second and Fourth Thursday of each month, opens at 6:30 AM; Clinic ends at 9 AM.  Patients are seen on a first-come first-served basis, and a limited number are seen during each clinic.   [facility]  [address] Hillard Danker [place], [place] [phone]   Eligibility Requirements You must have lived in [place], [place], or [place] counties for at least the last three months.   You cannot be eligible for state or federal sponsored [employer], including [employer], [place], or [employer].   You generally cannot be eligible for healthcare insurance through your employer.    How to apply: Eligibility screenings are held every Tuesday and Wednesday afternoon from 1:00 pm until 4:00 pm. You do not need an appointment for the interview!  [facility] [address] [place], [place]   [place]   [place] Department  [place]  [phone]    Behavioral Health Resources in the Community: Intensive Outpatient Programs Organization         Address  Phone  Notes  Lisco Haralson. [address], [place], [place]  [facility] Outpatient [address] [place], [place]   ADS: Alcohol & Drug Svcs [address], [place], [place]   [place] [address],  [place], [place] or [phone]   Substance Abuse Resources Organization         Address  Phone  Notes  Alcohol and Drug Services  [phone]   [place]  [phone]   [facility] [place]   Chinita Pester  [phone]   Residential & Outpatient Substance Abuse Program  [phone]   Psychological Services Organization         Address  Phone  Notes  [facility] [place]  [place]  [phone]   [place] [address], [place] or [phone]    Mobile Crisis Teams Organization         Address  Phone  Notes  [facility], Mobile Crisis Care Unit  [phone]   [facility]otherapeutic Services  [address] [place], [place]   [place] Levels [address], [place] [phone]    Self-Help/Support Groups Organization         Address  Phone             Notes  [place]ynn. of [place] - variety of support groups  [place] Call for more information  Narcotics Anonymous (NA), [facility] [address], [employer] [place]  2 meetings at this location   Special educational needs teacher         Address  Phone  Notes  [facility] Residential Treatment [place],    [place]  [phone]   [facility]  [address] [place] [id number], [place], [place]    [place] [facility] [place], [place] [phone] Admissions: 8am-3pm M-F  Incentives Substance [place] [address],    [place], [place] [phone]   [facility] [address] [place], [place], [place]   [facility] [address],  [place], [place]   [facility] Programs - Intensive Outpatient [address] Kristeen [address], [place], [place]   [facility] ([place].) [place].,  [place], [place] [phone] or [phone]   [facility] Services (RTS) [address] [place], [place] Accepts Medicaid  [facility] [place] [address]  [place] 1-2085491811 Substance Abuse/Addiction Treatment   [facility] Organization         Address  Phone  Notes  [facility]  [phone]   Domenic Schwab, PhD [address] Arlis [place], [place]   [phone] or [phone]   [place], [place] [phone]   [facility] [address], [place], [place] [phone] Insurance/Medicaid/sponsorship through [facility] and Families [address] [place]                                    [place], [place] [phone] [place] [address], [place] [phone]    Dr. Adele Schilder  [phone]   [facility] Dept. 1) [address], [place] 2) [place] 3)  [place], [place] [phone]  [phone]   [employer] Child Abuse Hotline 604-544-7534 or 951-377-9726 (After Hours)

## 2015-06-30 ENCOUNTER — Other Ambulatory Visit: Payer: Self-pay | Admitting: Family Medicine

## 2015-06-30 DIAGNOSIS — J441 Chronic obstructive pulmonary disease with (acute) exacerbation: Secondary | ICD-10-CM

## 2015-06-30 MED ORDER — IPRATROPIUM BROMIDE HFA 17 MCG/ACT IN AERS: 2.0000 | INHALATION_SPRAY | Freq: Two times a day (BID) | RESPIRATORY_TRACT | Status: DC

## 2015-06-30 MED ORDER — IPRATROPIUM BROMIDE HFA 17 MCG/ACT IN AERS: 2.0000 | INHALATION_SPRAY | Freq: Four times a day (QID) | RESPIRATORY_TRACT | Status: DC | PRN

## 2015-07-13 ENCOUNTER — Emergency Department (HOSPITAL_COMMUNITY): Payer: Medicare Other

## 2015-07-13 ENCOUNTER — Encounter (HOSPITAL_COMMUNITY): Payer: Self-pay | Admitting: Emergency Medicine

## 2015-07-13 ENCOUNTER — Emergency Department (HOSPITAL_COMMUNITY)
Admission: EM | Admit: 2015-07-13 | Discharge: 2015-07-14 | Disposition: A | Discharge status: Home / Self Care | Payer: Medicare Other | Attending: Emergency Medicine | Admitting: Emergency Medicine

## 2015-07-13 DIAGNOSIS — M79661 Pain in right lower leg: Secondary | ICD-10-CM | POA: Diagnosis not present

## 2015-07-13 DIAGNOSIS — Z792 Long term (current) use of antibiotics: Secondary | ICD-10-CM | POA: Diagnosis not present

## 2015-07-13 DIAGNOSIS — Z880 Allergy status to penicillin: Secondary | ICD-10-CM | POA: Insufficient documentation

## 2015-07-13 DIAGNOSIS — M25551 Pain in right hip: Secondary | ICD-10-CM | POA: Diagnosis present

## 2015-07-13 DIAGNOSIS — Z8709 Personal history of other diseases of the respiratory system: Secondary | ICD-10-CM | POA: Insufficient documentation

## 2015-07-13 DIAGNOSIS — I10 Essential (primary) hypertension: Secondary | ICD-10-CM | POA: Insufficient documentation

## 2015-07-13 DIAGNOSIS — R200 Anesthesia of skin: Secondary | ICD-10-CM | POA: Insufficient documentation

## 2015-07-13 DIAGNOSIS — Z86018 Personal history of other benign neoplasm: Secondary | ICD-10-CM | POA: Insufficient documentation

## 2015-07-13 DIAGNOSIS — Z86718 Personal history of other venous thrombosis and embolism: Secondary | ICD-10-CM | POA: Diagnosis not present

## 2015-07-13 DIAGNOSIS — R739 Hyperglycemia, unspecified: Secondary | ICD-10-CM

## 2015-07-13 DIAGNOSIS — I509 Heart failure, unspecified: Secondary | ICD-10-CM | POA: Insufficient documentation

## 2015-07-13 DIAGNOSIS — Z8719 Personal history of other diseases of the digestive system: Secondary | ICD-10-CM | POA: Diagnosis not present

## 2015-07-13 DIAGNOSIS — J449 Chronic obstructive pulmonary disease, unspecified: Secondary | ICD-10-CM | POA: Insufficient documentation

## 2015-07-13 DIAGNOSIS — M1990 Unspecified osteoarthritis, unspecified site: Secondary | ICD-10-CM | POA: Insufficient documentation

## 2015-07-13 DIAGNOSIS — F17210 Nicotine dependence, cigarettes, uncomplicated: Secondary | ICD-10-CM | POA: Diagnosis not present

## 2015-07-13 DIAGNOSIS — M5416 Radiculopathy, lumbar region: Secondary | ICD-10-CM

## 2015-07-13 DIAGNOSIS — E119 Type 2 diabetes mellitus without complications: Secondary | ICD-10-CM | POA: Diagnosis not present

## 2015-07-13 MED ORDER — ONDANSETRON 4 MG PO TBDP
4.0000 mg | ORAL_TABLET | Freq: Once | ORAL | Status: AC
  Administered 2015-07-13: 4 mg via ORAL
  Filled 2015-07-13: qty 1

## 2015-07-13 MED ORDER — OXYCODONE-ACETAMINOPHEN 5-325 MG PO TABS
1.0000 | ORAL_TABLET | Freq: Once | ORAL | Status: AC
  Administered 2015-07-13: 1 via ORAL
  Filled 2015-07-13: qty 1

## 2015-07-13 MED ORDER — METFORMIN HCL 500 MG PO TABS: 500.0000 mg | ORAL_TABLET | Freq: Every day | ORAL | Status: DC

## 2015-07-13 NOTE — ED Notes (Signed)
This RN went in and attempted to rouse patient.  Patient responding, but speech is still slurred, patient falls back asleep easily.  Pt's grandaughter brought her subway, patient provided with ice water.  Grandaughter states she will encourage her to eat.

## 2015-07-13 NOTE — ED Provider Notes (Signed)
CSN: [id number]     Arrival date & time 07/13/15  1640 History   First MD Initiated Contact with Patient 07/13/15 2002     Chief Complaint  Patient presents with  . Foot Pain  . Knee Pain  . Hip Pain     (Consider location/radiation/quality/duration/timing/severity/associated sxs/prior Treatment) HPI   Sarah [name] is a 60 y.o. female who presents to the Emergency Department complaining of constant right hip pain with associated radiating pain to the right knee and toes that began earlier today. Pt reports pain "shooting" to her toes associated with numbness in her 2nd and 3rd toe. Pt has Excedrin  with little relief. Pt denies any known injury. She is a diabetic and has been out of her Metformin for 1 month. She has also not checked her  Glucose for 1 month. She refuses to have any blood work or CBG done at todays visit, because she doesn't want "to do it like that". She is willing to check her own glucose and is is 198 in the room. Pt reports just ate a [employer] before I came in the room and requests a Coka-Cola during the interview. She denies having any CP, SOB, fatigue, DOE, lower extremity swelling, headaches, fever, or any other associated symptoms. Pain is exacerbated by palpation and movement. No loss of bowel or bladder control  Past Medical History  Diagnosis Date  . Hypertension   . Gastritis     01-13-2015  per ED note  . GERD (gastroesophageal reflux disease)   . Headache   . Arthritis   . COPD (chronic obstructive pulmonary disease) ([place])   . Type 2 diabetes mellitus ([place])   . History of pulmonary edema     mild -  07-22-2014  post-op  surgery 07-20-2014  . History of acute pancreatitis     01-04-2013   . Acute pancreatitis     01-13-2015 per ED note  . Adrenal adenoma     bilateral  . Ulnar nerve compression     right elbow  . History of DVT of lower extremity     2000  post op left knee surgery  . History of acute congestive heart failure     07-22-2014    post-op surgery 07-20-2014  . Complication of anesthesia     post-op 07-20-2014 surgery post-op Hypoxia due to acute CHF/ mild Pulmonary edema  . Bilateral lower extremity edema   . History of alcohol abuse     state quit 2002/  drank daily >fifth of gin   Past Surgical History  Procedure Laterality Date  . Dilation and curettage of uterus    . Ventral hernia repair N/A 07/20/2014    Procedure: LAPAROSCOPIC REPAIR INCARCARATED VENTRAL HERNIA AND UMBILICAL HERNIA;  Surgeon: Fanny Skates, MD;  Location: [facility];  Service: General;  Laterality: N/A;  . Insertion of mesh N/A 07/20/2014    Procedure: INSERTION OF MESH;  Surgeon: Fanny Skates, MD;  Location: [facility];  Service: General;  Laterality: N/A;  . Knee arthroscopy Left 2000  . Total abdominal hysterectomy w/ bilateral salpingoophorectomy  1995  . Transthoracic echocardiogram  07-22-2014    mild LVH,  60-65%,  grade I diastolic dysfunction/  mild LAE   No family history on file. Social History  Substance Use Topics  . Smoking status: Current Every Day Smoker -- 1.50 packs/day for 40 years    Types: Cigarettes  . Smokeless tobacco: Never Used  . Alcohol Use: No  Comment: hx alcohol abuse- drank >fifth of gin daily/  states Quit 2002   OB History    No data available     Review of Systems  Review of Systems All other systems negative except as documented in the HPI. All pertinent positives and negatives as reviewed in the HPI.   Allergies  Aspirin; Ibuprofen; and Penicillins  Home Medications   Prior to Admission medications   Medication Sig Start Date End Date Taking? Authorizing Provider  Acetaminophen-Aspirin Buffered (EXCEDRIN BACK & BODY PO) Take 1 tablet by mouth 2 (two) times daily as needed (pain).   Yes Historical Provider, MD  albuterol (PROVENTIL HFA;VENTOLIN HFA) 108 (90 Base) MCG/ACT inhaler Inhale 2 puffs into the lungs every 4 (four) hours as needed for wheezing or shortness of breath. 06/29/15  Yes Hannah  Muthersbaugh, PA-C  amLODipine (NORVASC) 10 MG tablet Take 1 tablet (10 mg total) by mouth daily. 03/02/15  Yes Josalyn Funches, MD  benzonatate (TESSALON) 100 MG capsule Take 1 capsule (100 mg total) by mouth every 8 (eight) hours. 06/29/15  Yes Hannah Muthersbaugh, PA-C  glimepiride (AMARYL) 4 MG tablet Take 1 tablet (4 mg total) by mouth daily with breakfast. D/c 1 mg amaryl. OV visit needed for additional refills 06/01/15  Yes Josalyn Funches, MD  hydrochlorothiazide (HYDRODIURIL) 25 MG tablet Take 1 tablet (25 mg total) by mouth daily. D/c 50 mg HCTZ Patient taking differently: Take 25 mg by mouth daily.  06/01/15  Yes Boykin Nearing, MD  HYDROcodone-acetaminophen (NORCO/VICODIN) 5-325 MG tablet Take 1 tablet by mouth every 6 (six) hours as needed for moderate pain or severe pain. 06/29/15  Yes Hannah Muthersbaugh, PA-C  ipratropium (ATROVENT HFA) 17 MCG/ACT inhaler Inhale 2 puffs into the lungs every 6 (six) hours as needed for wheezing. 06/30/15  Yes Josalyn Funches, MD  lisinopril (PRINIVIL,ZESTRIL) 5 MG tablet Take 1 tablet (5 mg total) by mouth every morning. 11/22/14  Yes Lorayne Marek, MD  magnesium hydroxide (MILK OF MAGNESIA) 400 MG/5ML suspension Take 30 mLs by mouth daily as needed for mild constipation.   Yes Historical Provider, MD  ofloxacin (OCUFLOX) 0.3 % ophthalmic solution Place 1 drop into both eyes 4 (four) times daily. 09/16/14  Yes Deepak Advani, MD  olopatadine (PATANOL) 0.1 % ophthalmic solution Place 1 drop into both eyes daily as needed for allergies.  04/05/15  Yes Historical Provider, MD  ondansetron (ZOFRAN ODT) 4 MG disintegrating tablet Take 1 tablet (4 mg total) by mouth every 8 (eight) hours as needed for nausea or vomiting. 05/04/15  Yes Mercedes Camprubi-Soms, PA-C  polyethylene glycol (MIRALAX / GLYCOLAX) packet Take 17 g by mouth daily as needed for mild constipation.   Yes Historical Provider, MD  potassium chloride SA (K-DUR,KLOR-CON) 20 MEQ tablet Take 1 tablet  (20 mEq total) by mouth daily. 06/29/15  Yes Hannah Muthersbaugh, PA-C  allopurinol (ZYLOPRIM) 100 MG tablet Take 1 tablet (100 mg total) by mouth daily. Patient not taking: Reported on 06/29/2015 05/02/15   Boykin Nearing, MD  meclizine (ANTIVERT) 25 MG tablet Take 1 tablet (25 mg total) by mouth 3 (three) times daily as needed for dizziness. Patient not taking: Reported on 07/13/2015 07/06/14   Veryl Speak, MD  metFORMIN (GLUCOPHAGE) 500 MG tablet Take 1 tablet (500 mg total) by mouth daily with breakfast. Patient not taking: Reported on 07/13/2015 11/22/14   Lorayne Marek, MD  pantoprazole (PROTONIX) 20 MG tablet Take 1 tablet (20 mg total) by mouth daily. Patient not taking: Reported on 07/13/2015 03/02/15  Boykin Nearing, MD  pantoprazole (PROTONIX) 20 MG tablet Take 1 tablet (20 mg total) by mouth daily. Patient not taking: Reported on 07/13/2015 05/04/15   Mercedes Camprubi-Soms, PA-C  predniSONE (DELTASONE) 20 MG tablet Take 2 tablets (40 mg total) by mouth daily. Patient not taking: Reported on 07/13/2015 06/29/15   Jarrett Soho Muthersbaugh, PA-C  sucralfate (CARAFATE) 1 G tablet Take 1 tablet (1 g total) by mouth 4 (four) times daily -  with meals and at bedtime. Patient not taking: Reported on 07/13/2015 01/13/15   Malvin Johns, MD   BP 116/56 mmHg  Pulse 77  Temp(Src) 98.2 F (36.8 C) (Oral)  Resp 18  Ht 5\' 8"  (1.727 m)  SpO2 97% Physical Exam  Constitutional: She appears well-developed and well-nourished. No distress.  HENT:  Head: Normocephalic and atraumatic.  Right Ear: Tympanic membrane and ear canal normal.  Left Ear: Tympanic membrane and ear canal normal.  Nose: Nose normal.  Mouth/Throat: Uvula is midline, oropharynx is clear and moist and mucous membranes are normal.  Eyes: Pupils are equal, round, and reactive to light.  Neck: Normal range of motion. Neck supple.  Cardiovascular: Normal rate and regular rhythm.   Pulmonary/Chest: Effort normal.  Abdominal: Soft.  No signs  of abdominal distention  Musculoskeletal:  No LE swelling Right Foot: Pedal pulse is strong when found with the doppler. Normal cap refill to all 5 toes. Decreased sensation to the ventral and dorsal side of her foot as well as to the 2nd and 3rd toes   Neurological: She is alert.  Acting at baseline  Skin: Skin is warm and dry. No rash noted.  Nursing note and vitals reviewed.   ED Course  Procedures (including critical care time) Labs Review Labs Reviewed - No data to display  Imaging Review Dg Lumbar Spine Complete  07/13/2015  CLINICAL DATA:  Radicular type symptoms to right lower extremity EXAM: LUMBAR SPINE - COMPLETE 4+ VIEW COMPARISON:  None. FINDINGS: Frontal, lateral, spot lumbosacral lateral, and bilateral oblique views were obtained. There are 5 non-rib-bearing lumbar type vertebral bodies. There is no fracture or spondylolisthesis. The disc spaces appear normal. There is facet osteoarthritic change at L4-5 and L5-S1 bilaterally and at L3-4 on the right. There is atherosclerotic calcification in the aorta and iliac arteries. There are surgical clips in the pelvis. IMPRESSION: Mild osteoarthritic change. No appreciable disc space narrowing. No fracture or spondylolisthesis. Atherosclerotic calcification noted in the aorta and iliac arteries. Electronically Signed   By: Lowella Grip III M.D.   On: 07/13/2015 21:27   I have personally reviewed and evaluated these images and lab results as part of my medical decision-making.   EKG Interpretation None      MDM   Final diagnoses:  Lumbar radiculopathy  Numbness of toes  Right calf pain   I consulted Dr. Janann Colonel, discussed patients symptoms. He does feel that she may need MRI but not on emergency basis since she does not have weakness, no loss of bowel or bladder control. She continues to have pain but the Percocet made her sedated, therefore she will be monitored.  Patient refused blood work. She checked her own glucose  and it was 194. Discussed appropriate diet but patient admits she will not be complaint with diet. Will refill Metformin.  Patient with back pain.  No neurological deficits and normal neuro exam.  Patient is ambulatory.  No loss of bowel or bladder control.  No concern for cauda equina.  No fever, night sweats, weight  loss, h/o cancer, IVDA, no recent procedure to back. No urinary symptoms suggestive of UTI.  Supportive care and return precaution discussed. Appears safe for discharge at this time. Follow up as indicated in discharge paperwork.    Patient asked to come back at 8:00am tomorrow for dopplers of her right LE to r/o DVT  due to calf pain (radiculopathy favored) No SOB  Delos Haring, PA-C 07/13/15 2342  Dorie Rank, MD 07/13/15 [phone]

## 2015-07-13 NOTE — ED Notes (Signed)
Patient called out, stated she was ready to go home, states she is fine to get up.  Patient able to sit up on edge of bed independently, able to dress self.  C/o pain getting up, still has limp, but steady on feet.

## 2015-07-13 NOTE — ED Notes (Signed)
Pharm tech at bedside 

## 2015-07-13 NOTE — ED Notes (Signed)
C/o numbness to R foot since 10:30am.  Reports pain to R great toe, R knee, and R hip.  No known injury.

## 2015-07-13 NOTE — Discharge Instructions (Signed)
Lumbosacral Radiculopathy °Lumbosacral radiculopathy is a condition that involves the spinal nerves and nerve roots in the low back and bottom of the spine. The condition develops when these nerves and nerve roots move out of place or become inflamed and cause symptoms. °CAUSES °This condition may be caused by: °· Pressure from a disk that bulges out of place (herniated disk). A disk is a plate of cartilage that separates bones in the spine. °· Disk degeneration. °· A narrowing of the bones of the lower back (spinal stenosis). °· A tumor. °· An infection. °· An injury that places sudden pressure on the disks that cushion the bones of your lower spine. °RISK FACTORS °This condition is more likely to develop in: °· Males aged 30-50 years. °· Females aged 50-60 years. °· People who lift improperly. °· People who are overweight or live a sedentary lifestyle. °· People who smoke. °· People who perform repetitive activities that strain the spine. °SYMPTOMS °Symptoms of this condition include: °· Pain that goes down from the back into the legs (sciatica). This is the most common symptom. The pain may be worse with sitting, coughing, or sneezing. °· Pain and numbness in the arms and legs. °· Muscle weakness. °· Tingling. °· Loss of bladder control or bowel control. °DIAGNOSIS °This condition is diagnosed with a physical exam and medical history. If the pain is lasting, you may have tests, such as: °· MRI scan. °· X-ray. °· CT scan. °· Myelogram. °· Nerve conduction study. °TREATMENT °This condition is often treated with: °· Hot packs and ice applied to affected areas. °· Stretches to improve flexibility. °· Exercises to strengthen back muscles. °· Physical therapy. °· Pain medicine. °· A steroid injection in the spine. °In some cases, no treatment is needed. If the condition is long-lasting (chronic), or if symptoms are severe, treatment may involve surgery or lifestyle changes, such as following a weight loss plan. °HOME  CARE INSTRUCTIONS °Medicines °· Take medicines only as directed by your health care provider. °· Do not drive or operate heavy machinery while taking pain medicine. °Injury Care °· Apply a heat pack to the injured area as directed by your health care provider. °· Apply ice to the affected area: °¨ Put ice in a plastic bag. °¨ Place a towel between your skin and the bag. °¨ Leave the ice on for 20-30 minutes, every 2 hours while you are awake or as needed. Or, leave the ice on for as long as directed by your health care provider. °Other Instructions °· If you were shown how to do any exercises or stretches, do them as directed by your health care provider. °· If your health care provider prescribed a diet or exercise program, follow it as directed. °· Keep all follow-up visits as directed by your health care provider. This is important. °SEEK MEDICAL CARE IF: °· Your pain does not improve over time even when taking pain medicines. °SEEK IMMEDIATE MEDICAL CARE IF: °· Your develop severe pain. °· Your pain suddenly gets worse. °· You develop increasing weakness in your legs. °· You lose the ability to control your bladder or bowel. °· You have difficulty walking or balancing. °· You have a fever. °  °This information is not intended to replace advice given to you by your health care provider. Make sure you discuss any questions you have with your health care provider. °  °Document Released: 06/04/2005 Document Revised: 10/19/2014 Document Reviewed: 05/31/2014 °Elsevier Interactive Patient Education ©2016 [employer] ° °

## 2015-07-13 NOTE — ED Notes (Addendum)
Oneita Hurt, RN went in to discharge patient and patient states she is the only person who can drive (patient has 6 visitors with her coming in and out of the room).  Patient made aware she cannot drive under current conditions.  Patient verbalized understanding.    Patient is responding to percocet with slurred speech and heavy eyes.  RN made PA aware

## 2015-07-14 ENCOUNTER — Emergency Department (HOSPITAL_COMMUNITY): Admission: EM | Admit: 2015-07-14 | Payer: Medicare Other | Source: Home / Self Care

## 2015-07-14 ENCOUNTER — Ambulatory Visit (HOSPITAL_BASED_OUTPATIENT_CLINIC_OR_DEPARTMENT_OTHER)
Admission: RE | Admit: 2015-07-14 | Discharge: 2015-07-14 | Disposition: A | Discharge status: Home / Self Care | Payer: Medicare Other | Source: Ambulatory Visit | Attending: Emergency Medicine | Admitting: Emergency Medicine

## 2015-07-14 ENCOUNTER — Emergency Department (HOSPITAL_COMMUNITY): Admit: 2015-07-14 | Payer: Medicare Other

## 2015-07-14 DIAGNOSIS — M79609 Pain in unspecified limb: Secondary | ICD-10-CM

## 2015-07-14 DIAGNOSIS — M5416 Radiculopathy, lumbar region: Secondary | ICD-10-CM | POA: Diagnosis not present

## 2015-07-14 NOTE — Progress Notes (Signed)
VASCULAR LAB PRELIMINARY  PRELIMINARY  PRELIMINARY  PRELIMINARY  Right lower extremity venous duplex completed.    Preliminary report:  There is no DVT or SVT noted in the right lower extremity.   Iona Stay, RVT 07/14/2015, 10:52 AM

## 2015-07-14 NOTE — ED Notes (Signed)
Patient able to dress and ambulate in a straight line independently.

## 2015-09-05 ENCOUNTER — Ambulatory Visit: Payer: Medicare Other | Attending: Internal Medicine | Admitting: Internal Medicine

## 2015-09-05 ENCOUNTER — Encounter: Payer: Self-pay | Admitting: Internal Medicine

## 2015-09-05 VITALS — BP 149/81 | HR 74 | Temp 97.7°F | Resp 16 | Ht 68.0 in | Wt 226.8 lb

## 2015-09-05 DIAGNOSIS — Z0000 Encounter for general adult medical examination without abnormal findings: Secondary | ICD-10-CM

## 2015-09-05 DIAGNOSIS — M545 Low back pain, unspecified: Secondary | ICD-10-CM

## 2015-09-05 DIAGNOSIS — Z7984 Long term (current) use of oral hypoglycemic drugs: Secondary | ICD-10-CM | POA: Insufficient documentation

## 2015-09-05 DIAGNOSIS — M109 Gout, unspecified: Secondary | ICD-10-CM | POA: Insufficient documentation

## 2015-09-05 DIAGNOSIS — M25551 Pain in right hip: Secondary | ICD-10-CM | POA: Insufficient documentation

## 2015-09-05 DIAGNOSIS — Z86718 Personal history of other venous thrombosis and embolism: Secondary | ICD-10-CM | POA: Insufficient documentation

## 2015-09-05 DIAGNOSIS — F17210 Nicotine dependence, cigarettes, uncomplicated: Secondary | ICD-10-CM | POA: Insufficient documentation

## 2015-09-05 DIAGNOSIS — Z888 Allergy status to other drugs, medicaments and biological substances status: Secondary | ICD-10-CM | POA: Insufficient documentation

## 2015-09-05 DIAGNOSIS — K859 Acute pancreatitis without necrosis or infection, unspecified: Secondary | ICD-10-CM | POA: Diagnosis not present

## 2015-09-05 DIAGNOSIS — Z880 Allergy status to penicillin: Secondary | ICD-10-CM | POA: Insufficient documentation

## 2015-09-05 DIAGNOSIS — E119 Type 2 diabetes mellitus without complications: Secondary | ICD-10-CM | POA: Diagnosis not present

## 2015-09-05 DIAGNOSIS — J449 Chronic obstructive pulmonary disease, unspecified: Secondary | ICD-10-CM | POA: Insufficient documentation

## 2015-09-05 DIAGNOSIS — M1990 Unspecified osteoarthritis, unspecified site: Secondary | ICD-10-CM | POA: Insufficient documentation

## 2015-09-05 DIAGNOSIS — F17200 Nicotine dependence, unspecified, uncomplicated: Secondary | ICD-10-CM

## 2015-09-05 DIAGNOSIS — I10 Essential (primary) hypertension: Secondary | ICD-10-CM | POA: Insufficient documentation

## 2015-09-05 DIAGNOSIS — K219 Gastro-esophageal reflux disease without esophagitis: Secondary | ICD-10-CM | POA: Insufficient documentation

## 2015-09-05 DIAGNOSIS — Z79899 Other long term (current) drug therapy: Secondary | ICD-10-CM | POA: Diagnosis not present

## 2015-09-05 DIAGNOSIS — I509 Heart failure, unspecified: Secondary | ICD-10-CM | POA: Insufficient documentation

## 2015-09-05 DIAGNOSIS — E1142 Type 2 diabetes mellitus with diabetic polyneuropathy: Secondary | ICD-10-CM

## 2015-09-05 LAB — LIPID PANEL
CHOL/HDL RATIO: 5.5 ratio — AB (ref <=5.0)
Cholesterol: 172 mg/dL (ref 125–200)
HDL: 31 mg/dL — AB (ref >=46)
LDL Cholesterol: 117 mg/dL (ref <130)
TRIGLYCERIDES: 122 mg/dL (ref <150)
VLDL: 24 mg/dL (ref <30)

## 2015-09-05 LAB — POCT GLYCOSYLATED HEMOGLOBIN (HGB A1C): HEMOGLOBIN A1C: 6.4

## 2015-09-05 LAB — BASIC METABOLIC PANEL
BUN: 9 mg/dL (ref 7–25)
CALCIUM: 9.3 mg/dL (ref 8.6–10.4)
CO2: 24 mmol/L (ref 20–31)
CREATININE: 0.65 mg/dL (ref 0.50–0.99)
Chloride: 105 mmol/L (ref 98–110)
GLUCOSE: 84 mg/dL (ref 65–99)
Potassium: 4 mmol/L (ref 3.5–5.3)
Sodium: 139 mmol/L (ref 135–146)

## 2015-09-05 LAB — TSH: TSH: 1.44 mIU/L

## 2015-09-05 LAB — GLUCOSE, POCT (MANUAL RESULT ENTRY): POC Glucose: 98 mg/dl (ref 70–99)

## 2015-09-05 LAB — RHEUMATOID FACTOR

## 2015-09-05 MED ORDER — LISINOPRIL 5 MG PO TABS: 5.0000 mg | ORAL_TABLET | Freq: Every morning | ORAL | Status: DC

## 2015-09-05 MED ORDER — GABAPENTIN 100 MG PO CAPS: 100.0000 mg | ORAL_CAPSULE | Freq: Three times a day (TID) | ORAL | Status: DC

## 2015-09-05 MED ORDER — HYDROCHLOROTHIAZIDE 25 MG PO TABS: 25.0000 mg | ORAL_TABLET | Freq: Every day | ORAL | Status: DC

## 2015-09-05 MED ORDER — BUPROPION HCL ER (SMOKING DET) 150 MG PO TB12: 150.0000 mg | ORAL_TABLET | Freq: Two times a day (BID) | ORAL | Status: DC

## 2015-09-05 MED ORDER — ALLOPURINOL 100 MG PO TABS: 100.0000 mg | ORAL_TABLET | Freq: Every day | ORAL | Status: DC

## 2015-09-05 NOTE — Progress Notes (Signed)
Sarah [name], is a 60 y.o. female  SX:[id number]  [id number]  DOB - 1955/09/18  CC:  Chief Complaint  Patient presents with  . Establish Care  . Medical Management of Chronic Issues       HPI: Sarah [name] is a 60 y.o. female here today to establish medical care.  This patient has a history of diabetes, hypertension, GERD, tobacco abuse.  She was last seen in our clinic on June 2016.  She was seen in emergency room on 07/13/2015 for right hip pain with associated radiating pain to the right knee and toes that began earlier in the day. Also noted pains "shooting" her toes associated with numbness in the second and third toe.  She comes to our clinic with same pain that she's had since January, especially in the right hip radiating down to her right leg with associated numbness and tingling. She also complains of bilateral hand swelling and joint pains, especially in the morning and gets better throughout the day.  She works at [employer] and stays fairly busy throughout the day. She also has 17 grandkids and she has a husband and one of her grandkids today with her. She denies any headache or chest pains. No abdominal pains. No nausea. No fevers or chills or shortness of breath today. She states she ran out of her lisinopril a few months ago and has been taking her allopurinol infrequently. She states she takes Vicodin to help her with her pain. Also, she has been taking NSAIDs as well. She is interested in stopping smoking. If her insurance will cover for medications. She denies any suicidal ideation. No auditory hallucinations, visual hallucinations or homicidal ideations currently. She denies any signs of depression.   Allergies  Allergen Reactions  . Aspirin Other (See Comments)    Causes her stomach pain.  . Ibuprofen Other (See Comments)    Causes stomach pain  . Penicillins Other (See Comments)    Localized whelps and redness after injection Has patient had a PCN reaction  causing immediate rash, facial/tongue/throat swelling, SOB or lightheadedness with hypotension: No Has patient had a PCN reaction causing severe rash involving mucus membranes or skin necrosis: Yes Has patient had a PCN reaction that required hospitalization No Has patient had a PCN reaction occurring within the last 10 years: No If all of the above answers are "NO", then may proceed with Cephalosporin use.    Past Medical History  Diagnosis Date  . Hypertension   . Gastritis     01-13-2015  per ED note  . GERD (gastroesophageal reflux disease)   . Headache   . Arthritis   . COPD (chronic obstructive pulmonary disease) ([facility])   . Type 2 diabetes mellitus ([place])   . History of pulmonary edema     mild -  07-22-2014  post-op  surgery 07-20-2014  . History of acute pancreatitis     01-04-2013   . Acute pancreatitis     01-13-2015 per ED note  . Adrenal adenoma     bilateral  . Ulnar nerve compression     right elbow  . History of DVT of lower extremity     2000  post op left knee surgery  . History of acute congestive heart failure     07-22-2014   post-op surgery 07-20-2014  . Complication of anesthesia     post-op 07-20-2014 surgery post-op Hypoxia due to acute CHF/ mild Pulmonary edema  . Bilateral lower extremity edema   . History  of alcohol abuse     state quit 2002/  drank daily >fifth of gin   Current Outpatient Prescriptions on File Prior to Visit  Medication Sig Dispense Refill  . Acetaminophen-Aspirin Buffered (EXCEDRIN BACK & BODY PO) Take 1 tablet by mouth 2 (two) times daily as needed (pain).    [name] albuterol (PROVENTIL HFA;VENTOLIN HFA) 108 (90 Base) MCG/ACT inhaler Inhale 2 puffs into the lungs every 4 (four) hours as needed for wheezing or shortness of breath. 1 Inhaler 3  . amLODipine (NORVASC) 10 MG tablet Take 1 tablet (10 mg total) by mouth daily. 30 tablet 5  . glimepiride (AMARYL) 4 MG tablet Take 1 tablet (4 mg total) by mouth daily with breakfast. D/c 1 mg  amaryl. OV visit needed for additional refills 30 tablet 0  . HYDROcodone-acetaminophen (NORCO/VICODIN) 5-325 MG tablet Take 1 tablet by mouth every 6 (six) hours as needed for moderate pain or severe pain. 6 tablet 0  . ipratropium (ATROVENT HFA) 17 MCG/ACT inhaler Inhale 2 puffs into the lungs every 6 (six) hours as needed for wheezing. 1 Inhaler 12  . magnesium hydroxide (MILK OF MAGNESIA) 400 MG/5ML suspension Take 30 mLs by mouth daily as needed for mild constipation.    . metFORMIN (GLUCOPHAGE) 500 MG tablet Take 1 tablet (500 mg total) by mouth daily with breakfast. 90 tablet 3  . metFORMIN (GLUCOPHAGE) 500 MG tablet Take 1 tablet (500 mg total) by mouth daily with breakfast. 30 tablet 0  . ofloxacin (OCUFLOX) 0.3 % ophthalmic solution Place 1 drop into both eyes 4 (four) times daily. 5 mL 0  . olopatadine (PATANOL) 0.1 % ophthalmic solution Place 1 drop into both eyes daily as needed for allergies.     [name] ondansetron (ZOFRAN ODT) 4 MG disintegrating tablet Take 1 tablet (4 mg total) by mouth every 8 (eight) hours as needed for nausea or vomiting. 15 tablet 0  . pantoprazole (PROTONIX) 20 MG tablet Take 1 tablet (20 mg total) by mouth daily. 30 tablet 0  . polyethylene glycol (MIRALAX / GLYCOLAX) packet Take 17 g by mouth daily as needed for mild constipation.    . benzonatate (TESSALON) 100 MG capsule Take 1 capsule (100 mg total) by mouth every 8 (eight) hours. (Patient not taking: Reported on 09/05/2015) 21 capsule 0  . potassium chloride SA (K-DUR,KLOR-CON) 20 MEQ tablet Take 1 tablet (20 mEq total) by mouth daily. (Patient not taking: Reported on 09/05/2015) 3 tablet 0   No current facility-administered medications on file prior to visit.   History reviewed. No pertinent family history. Social History   Social History  . Marital Status: Married    Spouse Name: N/A  . Number of Children: N/A  . Years of Education: N/A   Occupational History  . Not on file.   Social History Main  Topics  . Smoking status: Current Every Day Smoker -- 1.50 packs/day for 40 years    Types: Cigarettes  . Smokeless tobacco: Never Used  . Alcohol Use: No     Comment: hx alcohol abuse- drank >fifth of gin daily/  states Quit 2002  . Drug Use: No  . Sexual Activity: Not on file   Other Topics Concern  . Not on file   Social History Narrative    Review of Systems: Constitutional: Negative for fever, chills, diaphoresis, activity change, appetite change and fatigue. HENT: Negative for ear pain, nosebleeds, congestion, facial swelling, rhinorrhea, neck pain, neck stiffness and ear discharge.  Eyes: Negative for pain,  discharge, redness, itching and visual disturbance.  Had eye exam about 61months ago. Respiratory: Negative for cough, choking, chest tightness, shortness of breath, wheezing and stridor.  Cardiovascular: Negative for chest pain, palpitations and leg swelling. Gastrointestinal: Negative for abdominal distention. Genitourinary: Negative for dysuria, urgency, frequency, hematuria, flank pain, decreased urine volume, difficulty urinating and dyspareunia.  Musculoskeletal: + back pain, right sided and radiates down her leg, joint swelling (bilat hands, knees r>l), arthralgia.   Neurological: Negative for dizziness, tremors, seizures, syncope, facial asymmetry, speech difficulty, weakness, light-headedness, numbness and headaches.  Hematological: Negative for adenopathy. Does not bruise/bleed easily. Psychiatric/Behavioral: Negative for hallucinations, behavioral problems, confusion, dysphoric mood, decreased concentration and agitation.    Objective:   Filed Vitals:   09/05/15 1142  BP: 149/81  Pulse: 74  Temp: 97.7 F (36.5 C)  Resp: 16    Physical Exam: Constitutional: Patient appears well-developed and well-nourished. No distress.  Morbid obese, sitting in patient chair. HENT: Normocephalic, atraumatic, External right and left ear normal. Oropharynx is clear and  moist.  Eyes: Conjunctivae and EOM are normal. PERRL, no scleral icterus. Neck: Normal ROM. Neck supple. No JVD. No tracheal deviation. No thyromegaly. CVS: RRR, S1/S2 +, no murmurs, no gallops, no carotid bruit.  Pulmonary: Effort and breath sounds normal, no stridor, rhonchi, wheezes, rales.  Abdominal: Soft. BS +, obese Musculoskeletal: ROM decreased on right knee, minimal effusion on right knee palpible.  Neg Homan's sign.  Reflexes decreased 1+ bilat (although difficult exam in patient chair).  Trace bilat le edema.  Lymphadenopathy: No lymphadenopathy noted, cervical. Neuro: Alert. Normal reflexes, muscle tone coordination. Cn 2-12 grossly intack. Skin: Skin is warm and dry. No rash noted. Not diaphoretic. No erythema. No pallor. Psychiatric: Normal mood and affect. Behavior, judgment, thought content normal.  Lab Results  Component Value Date   WBC 4.9 06/29/2015   HGB 14.3 06/29/2015   HCT 41.9 06/29/2015   MCV 78.9 06/29/2015   PLT 237 06/29/2015   Lab Results  Component Value Date   CREATININE 0.78 06/29/2015   BUN 10 06/29/2015   NA 135 06/29/2015   K 2.6* 06/29/2015   CL 93* 06/29/2015   CO2 29 06/29/2015    Lab Results  Component Value Date   HGBA1C 6.4 09/05/2015   Lipid Panel     Component Value Date/Time   CHOL 176 01/05/2013 1041   TRIG 165* 01/05/2013 1041   HDL 21* 01/05/2013 1041   CHOLHDL 8.4 01/05/2013 1041   VLDL 33 01/05/2013 1041   LDLCALC 122* 01/05/2013 1041      07/13/15 lumbar spine films IMPRESSION: Mild osteoarthritic change. No appreciable disc space narrowing. No fracture or spondylolisthesis. Atherosclerotic calcification noted in the aorta and iliac arteries.  Assessment and plan:   1. Controlled type 2 diabetes mellitus without complication, without long-term current use of insulin (HCC) Continue same regimen. - Glucose (CBG) 98 - POCT A1C 6.4 today  2. Essential hypertension, benign Refilled: - lisinopril  (PRINIVIL,ZESTRIL) 5 MG tablet; Take 1 tablet (5 mg total) by mouth every morning.  Dispense: 90 tablet; Refill: 3 - hydrochlorothiazide (HYDRODIURIL) 25 MG tablet; Take 1 tablet (25 mg total) by mouth daily. D/c 50 mg HCTZ  Dispense: 90 tablet; Refill: 3  3. Gout of multiple sites, unspecified cause, unspecified chronicity - renewed allopurinol and recd taking in regularly. - allopurinol (ZYLOPRIM) 100 MG tablet; Take 1 tablet (100 mg total) by mouth daily.  Dispense: 30 tablet; Refill: 2 - chk Rheumatoid factor and ANA  4.  Low back pain radiating to right lower extremity -noted lumbar spine films. - MR Lumbar Spine Wo Contrast; Future - low back pain exercises recd  5. Morbid obesity, unspecified obesity type ([place]) Encouraged diet/exericise   6. Tobacco use disorder with hx of COPD - on inh - smoking cessation counseling given. She wants to stop, will try. - buPROPion (ZYBAN) 150 MG 12 hr tablet; Take 1 tablet (150 mg total) by mouth 2 (two) times daily.  Dispense: 60 tablet; Refill: 3  7. Diabetic polyneuropathy associated with type 2 diabetes mellitus (HCC) - trial neurontin 100mg  tid, adjust as needed.  6. Health care maintenance chk today - TSH - Vitamin D, 25-hydroxy - Lipid Panel - Basic Metabolic Panel - Ambulatory referral to Gastroenterology - for colonoscopy. States she didn't get it a few years ago b/c of copay cost. - due for eye exam in about 6months - mammogram 04/01/15 negative    Return in about 3 months (around 12/06/2015), or if symptoms worsen or fail to improve.  The patient was given clear instructions to go to ER or return to medical center if symptoms don't improve, worsen or new problems develop. The patient verbalized understanding. The patient was told to call to get lab results if they haven't heard anything in the next week.     This note has been created with Surveyor, quantity. Any transcriptional  errors are unintentional.    Maren Reamer, MD, [place], [place]   09/05/2015, 1:30 PM

## 2015-09-05 NOTE — Patient Instructions (Addendum)
- BLOOD WORK TODAY - referral for colonoscopy - referral specialist will call you about appt. - mri back ordered - referral specialist will call you about appointment  It was a pleasure meeting you.  Back Pain, Adult Back pain is very common in adults.The cause of back pain is rarely dangerous and the pain often gets better over time.The cause of your back pain may not be known. Some common causes of back pain include:  Strain of the muscles or ligaments supporting the spine.  Wear and tear (degeneration) of the spinal disks.  Arthritis.  Direct injury to the back. For many people, back pain may return. Since back pain is rarely dangerous, most people can learn to manage this condition on their own. HOME CARE INSTRUCTIONS Watch your back pain for any changes. The following actions may help to lessen any discomfort you are feeling:  Remain active. It is stressful on your back to sit or stand in one place for long periods of time. Do not sit, drive, or stand in one place for more than 30 minutes at a time. Take short walks on even surfaces as soon as you are able.Try to increase the length of time you walk each day.  Exercise regularly as directed by your health care provider. Exercise helps your back heal faster. It also helps avoid future injury by keeping your muscles strong and flexible.  Do not stay in bed.Resting more than 1-2 days can delay your recovery.  Pay attention to your body when you bend and lift. The most comfortable positions are those that put less stress on your recovering back. Always use proper lifting techniques, including:  Bending your knees.  Keeping the load close to your body.  Avoiding twisting.  Find a comfortable position to sleep. Use a firm mattress and lie on your side with your knees slightly bent. If you lie on your back, put a pillow under your knees.  Avoid feeling anxious or stressed.Stress increases muscle tension and can worsen back  pain.It is important to recognize when you are anxious or stressed and learn ways to manage it, such as with exercise.  Take medicines only as directed by your health care provider. Over-the-counter medicines to reduce pain and inflammation are often the most helpful.Your health care provider may prescribe muscle relaxant drugs.These medicines help dull your pain so you can more quickly return to your normal activities and healthy exercise.  Apply ice to the injured area:  Put ice in a plastic bag.  Place a towel between your skin and the bag.  Leave the ice on for 20 minutes, 2-3 times a day for the first 2-3 days. After that, ice and heat may be alternated to reduce pain and spasms.  Maintain a healthy weight. Excess weight puts extra stress on your back and makes it difficult to maintain good posture. SEEK MEDICAL CARE IF:  You have pain that is not relieved with rest or medicine.  You have increasing pain going down into the legs or buttocks.  You have pain that does not improve in one week.  You have night pain.  You lose weight.  You have a fever or chills. SEEK IMMEDIATE MEDICAL CARE IF:   You develop new bowel or bladder control problems.  You have unusual weakness or numbness in your arms or legs.  You develop nausea or vomiting.  You develop abdominal pain.  You feel faint.   This information is not intended to replace advice given to  you by your health care provider. Make sure you discuss any questions you have with your health care provider.   Document Released: 06/04/2005 Document Revised: 06/25/2014 Document Reviewed: 10/06/2013 Elsevier Interactive Patient Education [employer].

## 2015-09-05 NOTE — Progress Notes (Signed)
Patient's here to reestablish care with multi health issues.  Patient c/o hand and feet and legs swelling x1wk now, rated 10/10.  Patient's c/o lower back pain rated 10/10, sharp pain, radiates down her hip.  Med refill allopurinol, glimepiride, and other

## 2015-09-06 LAB — ANA: ANA: NEGATIVE

## 2015-09-06 LAB — VITAMIN D 25 HYDROXY (VIT D DEFICIENCY, FRACTURES): Vit D, 25-Hydroxy: 9 ng/mL — ABNORMAL LOW (ref 30–100)

## 2015-09-07 ENCOUNTER — Telehealth: Payer: Self-pay | Admitting: *Deleted

## 2015-09-07 ENCOUNTER — Other Ambulatory Visit: Payer: Self-pay | Admitting: Internal Medicine

## 2015-09-07 DIAGNOSIS — E559 Vitamin D deficiency, unspecified: Secondary | ICD-10-CM

## 2015-09-07 MED ORDER — VITAMIN D (ERGOCALCIFEROL) 1.25 MG (50000 UNIT) PO CAPS: 50000.0000 [IU] | ORAL_CAPSULE | ORAL | Status: DC

## 2015-09-07 NOTE — Telephone Encounter (Signed)
-----   Message from Maren Reamer, MD sent at 09/07/2015  9:46 AM EDT ----- Please call patient with her lab results.  Her work up for rheumatoid arthritis was all negative.  However, her labs for Vit D was extremely low, which would contribute to much of her joint aches/muscles pains.  I have put in prescription for Vit D, and will rechk her levels and see how she is responding to it in 3 months. Her cholesterol panel is also in good range.  Her HDL (good cholesterol) was low, only way to improve is diet and increasing exercise (part genetics as well).  Kidney and thyroid test all normal as well.  Encourage increasing activity and watching diet/DASH diet. Thanks.

## 2015-09-07 NOTE — Telephone Encounter (Signed)
Patient name and date of birth verified and results given.

## 2015-09-09 ENCOUNTER — Emergency Department (HOSPITAL_COMMUNITY): Payer: Medicare Other

## 2015-09-09 ENCOUNTER — Encounter (HOSPITAL_COMMUNITY): Payer: Self-pay | Admitting: Emergency Medicine

## 2015-09-09 ENCOUNTER — Emergency Department (HOSPITAL_COMMUNITY)
Admission: EM | Admit: 2015-09-09 | Discharge: 2015-09-09 | Disposition: A | Discharge status: Home / Self Care | Payer: Medicare Other | Attending: Emergency Medicine | Admitting: Emergency Medicine

## 2015-09-09 DIAGNOSIS — Z86718 Personal history of other venous thrombosis and embolism: Secondary | ICD-10-CM | POA: Diagnosis not present

## 2015-09-09 DIAGNOSIS — G8929 Other chronic pain: Secondary | ICD-10-CM | POA: Diagnosis not present

## 2015-09-09 DIAGNOSIS — M79641 Pain in right hand: Secondary | ICD-10-CM | POA: Diagnosis not present

## 2015-09-09 DIAGNOSIS — M7989 Other specified soft tissue disorders: Secondary | ICD-10-CM | POA: Insufficient documentation

## 2015-09-09 DIAGNOSIS — Z79899 Other long term (current) drug therapy: Secondary | ICD-10-CM | POA: Diagnosis not present

## 2015-09-09 DIAGNOSIS — Z7984 Long term (current) use of oral hypoglycemic drugs: Secondary | ICD-10-CM | POA: Diagnosis not present

## 2015-09-09 DIAGNOSIS — I10 Essential (primary) hypertension: Secondary | ICD-10-CM | POA: Diagnosis not present

## 2015-09-09 DIAGNOSIS — M79642 Pain in left hand: Secondary | ICD-10-CM | POA: Diagnosis not present

## 2015-09-09 DIAGNOSIS — M1990 Unspecified osteoarthritis, unspecified site: Secondary | ICD-10-CM | POA: Insufficient documentation

## 2015-09-09 DIAGNOSIS — F17210 Nicotine dependence, cigarettes, uncomplicated: Secondary | ICD-10-CM | POA: Diagnosis not present

## 2015-09-09 DIAGNOSIS — Z880 Allergy status to penicillin: Secondary | ICD-10-CM | POA: Insufficient documentation

## 2015-09-09 DIAGNOSIS — M109 Gout, unspecified: Secondary | ICD-10-CM | POA: Diagnosis not present

## 2015-09-09 DIAGNOSIS — J449 Chronic obstructive pulmonary disease, unspecified: Secondary | ICD-10-CM | POA: Insufficient documentation

## 2015-09-09 DIAGNOSIS — Z86018 Personal history of other benign neoplasm: Secondary | ICD-10-CM | POA: Diagnosis not present

## 2015-09-09 DIAGNOSIS — M25561 Pain in right knee: Secondary | ICD-10-CM | POA: Diagnosis not present

## 2015-09-09 DIAGNOSIS — K219 Gastro-esophageal reflux disease without esophagitis: Secondary | ICD-10-CM | POA: Diagnosis not present

## 2015-09-09 DIAGNOSIS — M79643 Pain in unspecified hand: Secondary | ICD-10-CM

## 2015-09-09 DIAGNOSIS — E119 Type 2 diabetes mellitus without complications: Secondary | ICD-10-CM | POA: Insufficient documentation

## 2015-09-09 DIAGNOSIS — I509 Heart failure, unspecified: Secondary | ICD-10-CM | POA: Diagnosis not present

## 2015-09-09 DIAGNOSIS — Z7951 Long term (current) use of inhaled steroids: Secondary | ICD-10-CM | POA: Insufficient documentation

## 2015-09-09 MED ORDER — HYDROCODONE-ACETAMINOPHEN 5-325 MG PO TABS
1.0000 | ORAL_TABLET | Freq: Once | ORAL | Status: AC
  Administered 2015-09-09: 1 via ORAL
  Filled 2015-09-09: qty 1

## 2015-09-09 MED ORDER — HYDROCODONE-ACETAMINOPHEN 5-325 MG PO TABS: 1.0000 | ORAL_TABLET | ORAL | Status: DC | PRN

## 2015-09-09 MED ORDER — NAPROXEN 500 MG PO TABS: 500.0000 mg | ORAL_TABLET | Freq: Two times a day (BID) | ORAL | Status: DC

## 2015-09-09 NOTE — ED Provider Notes (Signed)
CSN: [id number]     Arrival date & time 09/09/15  1023 History   First MD Initiated Contact with Patient 09/09/15 1131     Chief Complaint  Patient presents with  . Leg Pain  . Hand Pain     (Consider location/radiation/quality/duration/timing/severity/associated sxs/prior Treatment) HPI   Patient is a 60 year old female past medical history of hypertension, diabetes, DVT, gout, CHF and COPD who presents the ED with complaint of right knee pain. Patient reports having chronic right knee and leg pain for the past few months but notes her right knee pain worsened over the past week. She endorses associated swelling to her right knee. Patient also reports having pain and swelling to bilateral hands. Patient reports she has had similar pain in the past. She notes her pain is typically worse in the morning and improves throughout the day. She notes she works at [employer] which requires her to use her hands all shift. Denies any recent fall, trauma, injury. Denies fever, chills, redness, warmth, numbness, tingling, weakness. She notes she has been taking BC powder and aspirin without relief. She notes she was seen by her PCP 4 days ago for similar symptoms and was started on gabapentin.  Past Medical History  Diagnosis Date  . Hypertension   . Gastritis     01-13-2015  per ED note  . GERD (gastroesophageal reflux disease)   . Headache   . Arthritis   . COPD (chronic obstructive pulmonary disease) ([place])   . Type 2 diabetes mellitus ([place])   . History of pulmonary edema     mild -  07-22-2014  post-op  surgery 07-20-2014  . History of acute pancreatitis     01-04-2013   . Acute pancreatitis     01-13-2015 per ED note  . Adrenal adenoma     bilateral  . Ulnar nerve compression     right elbow  . History of DVT of lower extremity     2000  post op left knee surgery  . History of acute congestive heart failure     07-22-2014   post-op surgery 07-20-2014  . Complication of  anesthesia     post-op 07-20-2014 surgery post-op Hypoxia due to acute CHF/ mild Pulmonary edema  . Bilateral lower extremity edema   . History of alcohol abuse     state quit 2002/  drank daily >fifth of gin   Past Surgical History  Procedure Laterality Date  . Dilation and curettage of uterus    . Ventral hernia repair N/A 07/20/2014    Procedure: LAPAROSCOPIC REPAIR INCARCARATED VENTRAL HERNIA AND UMBILICAL HERNIA;  Surgeon: Fanny Skates, MD;  Location: [facility];  Service: General;  Laterality: N/A;  . Insertion of mesh N/A 07/20/2014    Procedure: INSERTION OF MESH;  Surgeon: Fanny Skates, MD;  Location: [facility];  Service: General;  Laterality: N/A;  . Knee arthroscopy Left 2000  . Total abdominal hysterectomy w/ bilateral salpingoophorectomy  1995  . Transthoracic echocardiogram  07-22-2014    mild LVH,  60-65%,  grade I diastolic dysfunction/  mild LAE   No family history on file. Social History  Substance Use Topics  . Smoking status: Current Every Day Smoker -- 1.50 packs/day for 40 years    Types: Cigarettes  . Smokeless tobacco: Never Used  . Alcohol Use: No     Comment: hx alcohol abuse- drank >fifth of gin daily/  states Quit 2002   OB History    No data available  Review of Systems  Musculoskeletal: Positive for myalgias and joint swelling (left knee, bilateral hands).  All other systems reviewed and are negative.     Allergies  Aspirin; Ibuprofen; and Penicillins  Home Medications   Prior to Admission medications   Medication Sig Start Date End Date Taking? Authorizing Provider  albuterol (PROVENTIL HFA;VENTOLIN HFA) 108 (90 Base) MCG/ACT inhaler Inhale 2 puffs into the lungs every 4 (four) hours as needed for wheezing or shortness of breath. 06/29/15  Yes Hannah Muthersbaugh, PA-C  amLODipine (NORVASC) 10 MG tablet Take 1 tablet (10 mg total) by mouth daily. 03/02/15  Yes Josalyn Funches, MD  beclomethasone (QVAR) 40 MCG/ACT inhaler Inhale 2 puffs into the  lungs daily as needed (for SOB).   Yes Historical Provider, MD  budesonide-formoterol (SYMBICORT) 160-4.5 MCG/ACT inhaler Inhale 2 puffs into the lungs as needed (for SOB).   Yes Historical Provider, MD  gabapentin (NEURONTIN) 100 MG capsule Take 1 capsule (100 mg total) by mouth 3 (three) times daily. Patient taking differently: Take 100 mg by mouth 3 (three) times daily. For pain 09/05/15  Yes Maren Reamer, MD  ondansetron (ZOFRAN ODT) 4 MG disintegrating tablet Take 1 tablet (4 mg total) by mouth every 8 (eight) hours as needed for nausea or vomiting. 05/04/15  Yes Mercedes Camprubi-Soms, PA-C  polyethylene glycol (MIRALAX / GLYCOLAX) packet Take 17 g by mouth daily as needed for mild constipation (Pt takes 3-4x/week).    Yes Historical Provider, MD  Vitamin D, Ergocalciferol, (DRISDOL) 50000 units CAPS capsule Take 1 capsule (50,000 Units total) by mouth every 7 (seven) days. 09/07/15  Yes Maren Reamer, MD  Acetaminophen-Aspirin Buffered (EXCEDRIN BACK & BODY PO) Take 1 tablet by mouth 5 (five) times daily as needed (pain).     Historical Provider, MD  allopurinol (ZYLOPRIM) 100 MG tablet Take 1 tablet (100 mg total) by mouth daily. 09/05/15   Maren Reamer, MD  benzonatate (TESSALON) 100 MG capsule Take 1 capsule (100 mg total) by mouth every 8 (eight) hours. Patient not taking: Reported on 09/05/2015 06/29/15   Jarrett Soho Muthersbaugh, PA-C  buPROPion (ZYBAN) 150 MG 12 hr tablet Take 1 tablet (150 mg total) by mouth 2 (two) times daily. 09/05/15   Maren Reamer, MD  glimepiride (AMARYL) 4 MG tablet Take 1 tablet (4 mg total) by mouth daily with breakfast. D/c 1 mg amaryl. OV visit needed for additional refills 06/01/15   Boykin Nearing, MD  hydrochlorothiazide (HYDRODIURIL) 25 MG tablet Take 1 tablet (25 mg total) by mouth daily. D/c 50 mg HCTZ 09/05/15   Maren Reamer, MD  HYDROcodone-acetaminophen (NORCO/VICODIN) 5-325 MG tablet Take 1 tablet by mouth every 4 (four) hours as needed.  09/09/15   Nona Dell, PA-C  ipratropium (ATROVENT HFA) 17 MCG/ACT inhaler Inhale 2 puffs into the lungs every 6 (six) hours as needed for wheezing. Patient taking differently: Inhale 1 puff into the lungs daily as needed for wheezing.  06/30/15   Josalyn Funches, MD  lisinopril (PRINIVIL,ZESTRIL) 5 MG tablet Take 1 tablet (5 mg total) by mouth every morning. 09/05/15   Maren Reamer, MD  magnesium hydroxide (MILK OF MAGNESIA) 400 MG/5ML suspension Take 30 mLs by mouth daily as needed for mild constipation.    Historical Provider, MD  metFORMIN (GLUCOPHAGE) 500 MG tablet Take 1 tablet (500 mg total) by mouth daily with breakfast. 11/22/14   Lorayne Marek, MD  naproxen (NAPROSYN) 500 MG tablet Take 1 tablet (500 mg total) by mouth 2 (  two) times daily. 09/09/15   Nona Dell, PA-C  ofloxacin (OCUFLOX) 0.3 % ophthalmic solution Place 1 drop into both eyes 4 (four) times daily. Patient taking differently: Place 1 drop into both eyes daily as needed (burning and blurry vision).  09/16/14   Lorayne Marek, MD  olopatadine (PATANOL) 0.1 % ophthalmic solution Place 1 drop into both eyes daily as needed for allergies (itchiness).  04/05/15   Historical Provider, MD  pantoprazole (PROTONIX) 20 MG tablet Take 1 tablet (20 mg total) by mouth daily. 05/04/15   Mercedes Camprubi-Soms, PA-C   BP 129/71 mmHg  Pulse 79  Temp(Src) 97.9 F (36.6 C) (Oral)  Resp 17  Ht 5\' 8"  (1.727 m)  Wt 102.513 kg  BMI 34.37 kg/m2  SpO2 98% Physical Exam  Constitutional: She is oriented to person, place, and time. She appears well-developed and well-nourished. No distress.  HENT:  Head: Normocephalic and atraumatic.  Mouth/Throat: Oropharynx is clear and moist. No oropharyngeal exudate.  Eyes: Conjunctivae and EOM are normal. Right eye exhibits no discharge. Left eye exhibits no discharge. No scleral icterus.  Neck: Normal range of motion. Neck supple.  Cardiovascular: Normal rate, regular rhythm,  normal heart sounds and intact distal pulses.   Pulmonary/Chest: Effort normal and breath sounds normal. No stridor. No respiratory distress. She has no wheezes. She has no rales. She exhibits no tenderness.  Abdominal: Soft. Bowel sounds are normal. She exhibits no distension and no mass. There is no tenderness. There is no rebound and no guarding.  Musculoskeletal: She exhibits no edema.       Right knee: She exhibits decreased range of motion (due to pain) and swelling. She exhibits no effusion, no ecchymosis, no deformity, no laceration, no erythema, normal alignment and normal patellar mobility.  Mild swelling noted to right anterior knee. Right knee diffusely tender with light palpation. Decreased range of motion of right knee due to reported pain. Full range of motion of right hip, ankle and foot. Sensation grossly intact. 2+ DP pulses.  Mild swelling noted to bilateral hands. Equal grip strength bilaterally. Full range of motion of bilateral upper extremities with 5 out of 5 strength. Sensation intact. 2+ radial pulses. Cap refill less than 2.  Neurological: She is alert and oriented to person, place, and time.  Skin: Skin is warm and dry. No rash noted. She is not diaphoretic.  Nursing note and vitals reviewed.   ED Course  Procedures (including critical care time) Labs Review Labs Reviewed - No data to display  Imaging Review Dg Knee Complete 4 Views Right  09/09/2015  CLINICAL DATA:  Right posterior knee pain for 4 days with no known injury EXAM: RIGHT KNEE - COMPLETE 4+ VIEW COMPARISON:  None. FINDINGS: Tricompartmental arthritis with mild joint space narrowing and osteophyte formation. No fracture or dislocation. Prominent posterior tibial osteophytes. Mild calcification of the femoral and popliteal arteries. No definite joint effusion. IMPRESSION: Mild to moderate arthritis with no acute findings Electronically Signed   By: Skipper Cliche M.D.   On: 09/09/2015 13:05   I have  personally reviewed and evaluated these images and lab results as part of my medical decision-making.   EKG Interpretation None      MDM   Final diagnoses:  Right knee pain  Pain of hand, unspecified laterality    Patient presents with right knee pain and bilateral hand pain and swelling. Denies any recent fall, trauma, injury. Denies fever, redness, numbness, tingling, weakness. Patient reports pain is consistent with  chronic pain she has had in the past. VSS. Exam revealed mild swelling to right anterior knee, tender to palpation, decreased range of motion of right knee due to reported pain, bilateral lower extremities otherwise neurovascularly intact. Mild swelling and tenderness to bilateral hands, bilateral upper extremities otherwise neurovascularly intact. No signs of infection/cellulitis. Right knee x-ray revealed mild to moderate arthritis. Patient given pain meds in the ED. I suspect patient's symptoms are likely due to to osteoarthritis and do not feel that any further workup or imagine is warranted at this time. Pt is able ambulate, no bony abnormality or deformity, no erythema or excessive heat, no evidence of cellulitis, DVT, or septic joint. Patient reports working a lot over the past few months and notes she uses her hands frequently at work. Discussed results and plan for discharge with patient. Patient discharged home with pain meds, NSAIDs and discussed symptomatic treatment. Advised patient to follow up with her PCP.    Chesley Noon [place], [place] 09/09/15 1356  Fredia Sorrow, MD 09/14/15 1006

## 2015-09-09 NOTE — Discharge Instructions (Signed)
Take your medication as prescribed. You may also apply ice to your knee for 15-20 minutes 3-4 times daily to help with pain and swelling. I recommend resting for the next few days and refrain from doing any heavy lifting or repetitive movements that exacerbate your symptoms. You may also continue taking your gout medication as prescribed. Follow-up with your primary care provider in 5-7 days.  Please return to the Emergency Department if symptoms worsen or new onset of fever, chest pain, shortness of breath, redness, swelling, warmth, numbness, tingling, weakness, leg swelling.

## 2015-09-09 NOTE — ED Notes (Signed)
Patient states R leg pain since Monday.   Patient states bilateral hand pain and swelling since Monday.  Patient states went to her primary doctor on Monday, and was put on Vit D2, neurontin, wellbutrin starting Monday.   Patient states she thinks something is "more wrong that what they said".

## 2015-09-09 NOTE — ED Notes (Signed)
Pt removed one wedding band (yellow/gold color) and one other ring (yellow/ gold color with one white stone). Rings removed because hand was swelling. Rings given to pt's granddaughter. She is sitting at bedside.

## 2015-09-09 NOTE — ED Notes (Signed)
Pt returns from xray

## 2015-12-12 ENCOUNTER — Ambulatory Visit: Payer: Medicare Other | Attending: Internal Medicine | Admitting: Internal Medicine

## 2015-12-12 ENCOUNTER — Other Ambulatory Visit: Payer: Self-pay | Admitting: *Deleted

## 2015-12-12 ENCOUNTER — Encounter: Payer: Self-pay | Admitting: Internal Medicine

## 2015-12-12 ENCOUNTER — Other Ambulatory Visit: Payer: Self-pay | Admitting: Internal Medicine

## 2015-12-12 VITALS — BP 127/70 | HR 81 | Temp 97.7°F | Resp 18 | Ht 68.0 in | Wt 217.8 lb

## 2015-12-12 DIAGNOSIS — Z1159 Encounter for screening for other viral diseases: Secondary | ICD-10-CM

## 2015-12-12 DIAGNOSIS — E559 Vitamin D deficiency, unspecified: Secondary | ICD-10-CM | POA: Diagnosis not present

## 2015-12-12 DIAGNOSIS — E139 Other specified diabetes mellitus without complications: Secondary | ICD-10-CM

## 2015-12-12 DIAGNOSIS — R5383 Other fatigue: Secondary | ICD-10-CM

## 2015-12-12 DIAGNOSIS — I10 Essential (primary) hypertension: Secondary | ICD-10-CM | POA: Diagnosis not present

## 2015-12-12 DIAGNOSIS — M109 Gout, unspecified: Secondary | ICD-10-CM | POA: Diagnosis not present

## 2015-12-12 DIAGNOSIS — E1140 Type 2 diabetes mellitus with diabetic neuropathy, unspecified: Secondary | ICD-10-CM

## 2015-12-12 DIAGNOSIS — Z1211 Encounter for screening for malignant neoplasm of colon: Secondary | ICD-10-CM

## 2015-12-12 DIAGNOSIS — Z114 Encounter for screening for human immunodeficiency virus [HIV]: Secondary | ICD-10-CM

## 2015-12-12 MED ORDER — AMLODIPINE BESYLATE 10 MG PO TABS: 10.0000 mg | ORAL_TABLET | Freq: Every day | ORAL | Status: DC

## 2015-12-12 MED ORDER — HYDROCHLOROTHIAZIDE 25 MG PO TABS: 25.0000 mg | ORAL_TABLET | Freq: Every day | ORAL | Status: DC

## 2015-12-12 MED ORDER — ALLOPURINOL 100 MG PO TABS: 100.0000 mg | ORAL_TABLET | Freq: Every day | ORAL | Status: DC

## 2015-12-12 MED ORDER — RANITIDINE HCL 150 MG PO TABS: 150.0000 mg | ORAL_TABLET | Freq: Two times a day (BID) | ORAL | Status: DC

## 2015-12-12 MED ORDER — GLIMEPIRIDE 1 MG PO TABS: 1.0000 mg | ORAL_TABLET | Freq: Every day | ORAL | Status: DC

## 2015-12-12 MED ORDER — METFORMIN HCL 500 MG PO TABS
500.0000 mg | ORAL_TABLET | Freq: Every day | ORAL | Status: DC
Start: 2015-12-12 — End: 2015-12-22

## 2015-12-12 MED ORDER — GABAPENTIN 300 MG PO CAPS: 300.0000 mg | ORAL_CAPSULE | Freq: Three times a day (TID) | ORAL | Status: DC

## 2015-12-12 MED ORDER — LISINOPRIL 5 MG PO TABS: 5.0000 mg | ORAL_TABLET | Freq: Every morning | ORAL | Status: DC

## 2015-12-12 MED ORDER — CITALOPRAM HYDROBROMIDE 20 MG PO TABS: 20.0000 mg | ORAL_TABLET | Freq: Every day | ORAL | Status: DC

## 2015-12-12 NOTE — Patient Instructions (Signed)
DASH Eating Plan DASH stands for "Dietary Approaches to Stop Hypertension." The DASH eating plan is a healthy eating plan that has been shown to reduce high blood pressure (hypertension). Additional health benefits may include reducing the risk of type 2 diabetes mellitus, heart disease, and stroke. The DASH eating plan may also help with weight loss. WHAT DO I NEED TO KNOW ABOUT THE DASH EATING PLAN? For the DASH eating plan, you will follow these general guidelines:  Choose foods with a percent daily value for sodium of less than 5% (as listed on the food label).  Use salt-free seasonings or herbs instead of table salt or sea salt.  Check with your health care provider or pharmacist before using salt substitutes.  Eat lower-sodium products, often labeled as "lower sodium" or "no salt added."  Eat fresh foods.  Eat more vegetables, fruits, and low-fat dairy products.  Choose whole grains. Look for the word "whole" as the first word in the ingredient list.  Choose fish and skinless chicken or turkey more often than red meat. Limit fish, poultry, and meat to 6 oz (170 g) each day.  Limit sweets, desserts, sugars, and sugary drinks.  Choose heart-healthy fats.  Limit cheese to 1 oz (28 g) per day.  Eat more home-cooked food and less restaurant, buffet, and fast food.  Limit fried foods.  Cook foods using methods other than frying.  Limit canned vegetables. If you do use them, rinse them well to decrease the sodium.  When eating at a restaurant, ask that your food be prepared with less salt, or no salt if possible. WHAT FOODS CAN I EAT? Seek help from a dietitian for individual calorie needs. Grains Whole grain or whole wheat bread. Brown rice. Whole grain or whole wheat pasta. Quinoa, bulgur, and whole grain cereals. Low-sodium cereals. Corn or whole wheat flour tortillas. Whole grain cornbread. Whole grain crackers. Low-sodium crackers. Vegetables Fresh or frozen vegetables  (raw, steamed, roasted, or grilled). Low-sodium or reduced-sodium tomato and vegetable juices. Low-sodium or reduced-sodium tomato sauce and paste. Low-sodium or reduced-sodium canned vegetables.  Fruits All fresh, canned (in natural juice), or frozen fruits. Meat and Other Protein Products Ground beef (85% or leaner), grass-fed beef, or beef trimmed of fat. Skinless chicken or turkey. Ground chicken or turkey. Pork trimmed of fat. All fish and seafood. Eggs. Dried beans, peas, or lentils. Unsalted nuts and seeds. Unsalted canned beans. Dairy Low-fat dairy products, such as skim or 1% milk, 2% or reduced-fat cheeses, low-fat ricotta or cottage cheese, or plain low-fat yogurt. Low-sodium or reduced-sodium cheeses. Fats and Oils Tub margarines without trans fats. Light or reduced-fat mayonnaise and salad dressings (reduced sodium). Avocado. Safflower, olive, or canola oils. Natural peanut or almond butter. Other Unsalted popcorn and pretzels. The items listed above may not be a complete list of recommended foods or beverages. Contact your dietitian for more options. WHAT FOODS ARE NOT RECOMMENDED? Grains White bread. White pasta. White rice. Refined cornbread. Bagels and croissants. Crackers that contain trans fat. Vegetables Creamed or fried vegetables. Vegetables in a cheese sauce. Regular canned vegetables. Regular canned tomato sauce and paste. Regular tomato and vegetable juices. Fruits Dried fruits. Canned fruit in light or heavy syrup. Fruit juice. Meat and Other Protein Products Fatty cuts of meat. Ribs, chicken wings, bacon, sausage, bologna, salami, chitterlings, fatback, hot dogs, bratwurst, and packaged luncheon meats. Salted nuts and seeds. Canned beans with salt. Dairy Whole or 2% milk, cream, half-and-half, and cream cheese. Whole-fat or sweetened yogurt. Full-fat   cheeses or blue cheese. Nondairy creamers and whipped toppings. Processed cheese, cheese spreads, or cheese  curds. Condiments Onion and garlic salt, seasoned salt, table salt, and sea salt. Canned and packaged gravies. Worcestershire sauce. Tartar sauce. Barbecue sauce. Teriyaki sauce. Soy sauce, including reduced sodium. Steak sauce. Fish sauce. Oyster sauce. Cocktail sauce. Horseradish. Ketchup and mustard. Meat flavorings and tenderizers. Bouillon cubes. Hot sauce. Tabasco sauce. Marinades. Taco seasonings. Relishes. Fats and Oils Butter, stick margarine, lard, shortening, ghee, and bacon fat. Coconut, palm kernel, or palm oils. Regular salad dressings. Other Pickles and olives. Salted popcorn and pretzels. The items listed above may not be a complete list of foods and beverages to avoid. Contact your dietitian for more information. WHERE CAN I FIND MORE INFORMATION? National [employer], [employer], [employer]: travelstabloid.com   This information is not intended to replace advice given to you by your health care provider. Make sure you discuss any questions you have with your health care provider.   Document Released: 05/24/2011 Document Revised: 06/25/2014 Document Reviewed: 04/08/2013 Elsevier Interactive Patient Education 2016 [employer]  - Diabetes Mellitus and Food It is important for you to manage your blood sugar (glucose) level. Your blood glucose level can be greatly affected by what you eat. Eating healthier foods in the appropriate amounts throughout the day at about the same time each day will help you control your blood glucose level. It can also help slow or prevent worsening of your diabetes mellitus. Healthy eating may even help you improve the level of your blood pressure and reach or maintain a healthy weight.  General recommendations for healthful eating and cooking habits include:  Eating meals and snacks regularly. Avoid going long periods of time without eating to lose weight.  Eating a diet that consists mainly of plant-based foods, such  as fruits, vegetables, nuts, legumes, and whole grains.  Using low-heat cooking methods, such as baking, instead of high-heat cooking methods, such as deep frying. Work with your dietitian to make sure you understand how to use the Nutrition Facts information on food labels. HOW CAN FOOD AFFECT ME? Carbohydrates Carbohydrates affect your blood glucose level more than any other type of food. Your dietitian will help you determine how many carbohydrates to eat at each meal and teach you how to count carbohydrates. Counting carbohydrates is important to keep your blood glucose at a healthy level, especially if you are using insulin or taking certain medicines for diabetes mellitus. Alcohol Alcohol can cause sudden decreases in blood glucose (hypoglycemia), especially if you use insulin or take certain medicines for diabetes mellitus. Hypoglycemia can be a life-threatening condition. Symptoms of hypoglycemia (sleepiness, dizziness, and disorientation) are similar to symptoms of having too much alcohol.  If your health care provider has given you approval to drink alcohol, do so in moderation and use the following guidelines:  Women should not have more than one drink per day, and men should not have more than two drinks per day. One drink is equal to:  12 oz of beer.  5 oz of wine.  1 oz of hard liquor.  Do not drink on an empty stomach.  Keep yourself hydrated. Have water, diet soda, or unsweetened iced tea.  Regular soda, juice, and other mixers might contain a lot of carbohydrates and should be counted. WHAT FOODS ARE NOT RECOMMENDED? As you make food choices, it is important to remember that all foods are not the same. Some foods have fewer nutrients per serving than other foods, even  though they might have the same number of calories or carbohydrates. It is difficult to get your body what it needs when you eat foods with fewer nutrients. Examples of foods that you should avoid that are  high in calories and carbohydrates but low in nutrients include:  Trans fats (most processed foods list trans fats on the Nutrition Facts label).  Regular soda.  Juice.  Candy.  Sweets, such as cake, pie, doughnuts, and cookies.  Fried foods. WHAT FOODS CAN I EAT? Eat nutrient-rich foods, which will nourish your body and keep you healthy. The food you should eat also will depend on several factors, including:  The calories you need.  The medicines you take.  Your weight.  Your blood glucose level.  Your blood pressure level.  Your cholesterol level. You should eat a variety of foods, including:  Protein.  Lean cuts of meat.  Proteins low in saturated fats, such as fish, egg whites, and beans. Avoid processed meats.  Fruits and vegetables.  Fruits and vegetables that may help control blood glucose levels, such as apples, mangoes, and yams.  Dairy products.  Choose fat-free or low-fat dairy products, such as milk, yogurt, and cheese.  Grains, bread, pasta, and rice.  Choose whole grain products, such as multigrain bread, whole oats, and brown rice. These foods may help control blood pressure.  Fats.  Foods containing healthful fats, such as nuts, avocado, olive oil, canola oil, and fish. DOES EVERYONE WITH DIABETES MELLITUS HAVE THE SAME MEAL PLAN? Because every person with diabetes mellitus is different, there is not one meal plan that works for everyone. It is very important that you meet with a dietitian who will help you create a meal plan that is just right for you.   This information is not intended to replace advice given to you by your health care provider. Make sure you discuss any questions you have with your health care provider.   Document Released: 03/01/2005 Document Revised: 06/25/2014 Document Reviewed: 05/01/2013 Elsevier Interactive Patient Education 2016 [employer].   - Diabetes and Exercise Exercising regularly is important. It is not  just about losing weight. It has many health benefits, such as:  Improving your overall fitness, flexibility, and endurance.  Increasing your bone density.  Helping with weight control.  Decreasing your body fat.  Increasing your muscle strength.  Reducing stress and tension.  Improving your overall health. People with diabetes who exercise gain additional benefits because exercise:  Reduces appetite.  Improves the body's use of blood sugar (glucose).  Helps lower or control blood glucose.  Decreases blood pressure.  Helps control blood lipids (such as cholesterol and triglycerides).  Improves the body's use of the hormone insulin by:  Increasing the body's insulin sensitivity.  Reducing the body's insulin needs.  Decreases the risk for heart disease because exercising:  Lowers cholesterol and triglycerides levels.  Increases the levels of good cholesterol (such as high-density lipoproteins [HDL]) in the body.  Lowers blood glucose levels. YOUR ACTIVITY PLAN  Choose an activity that you enjoy, and set realistic goals. To exercise safely, you should begin practicing any new physical activity slowly, and gradually increase the intensity of the exercise over time. Your health care provider or diabetes educator can help create an activity plan that works for you. General recommendations include:  Encouraging children to engage in at least 60 minutes of physical activity each day.  Stretching and performing strength training exercises, such as yoga or weight lifting, at least 2  times per week.  Performing a total of at least 150 minutes of moderate-intensity exercise each week, such as brisk walking or water aerobics.  Exercising at least 3 days per week, making sure you allow no more than 2 consecutive days to pass without exercising.  Avoiding long periods of inactivity (90 minutes or more). When you have to spend an extended period of time sitting down, take frequent  breaks to walk or stretch. RECOMMENDATIONS FOR EXERCISING WITH TYPE 1 OR TYPE 2 DIABETES   Check your blood glucose before exercising. If blood glucose levels are greater than 240 mg/dL, check for urine ketones. Do not exercise if ketones are present.  Avoid injecting insulin into areas of the body that are going to be exercised. For example, avoid injecting insulin into:  The arms when playing tennis.  The legs when jogging.  Keep a record of:  Food intake before and after you exercise.  Expected peak times of insulin action.  Blood glucose levels before and after you exercise.  The type and amount of exercise you have done.  Review your records with your health care provider. Your health care provider will help you to develop guidelines for adjusting food intake and insulin amounts before and after exercising.  If you take insulin or oral hypoglycemic agents, watch for signs and symptoms of hypoglycemia. They include:  Dizziness.  Shaking.  Sweating.  Chills.  Confusion.  Drink plenty of water while you exercise to prevent dehydration or heat stroke. Body water is lost during exercise and must be replaced.  Talk to your health care provider before starting an exercise program to make sure it is safe for you. Remember, almost any type of activity is better than none.   This information is not intended to replace advice given to you by your health care provider. Make sure you discuss any questions you have with your health care provider.   Document Released: 08/25/2003 Document Revised: 10/19/2014 Document Reviewed: 11/11/2012 Elsevier Interactive Patient Education 2016 [place] for Eating Away From Home If You Have Diabetes Controlling your level of blood glucose, also known as blood sugar, can be challenging. It can be even more difficult when you do not prepare your own meals. The following tips can help you manage your diabetes when you eat away from  home. PLANNING AHEAD Plan ahead if you know you will be eating away from home:  Ask your health care provider how to time meals and medicine if you are taking insulin.  Make a list of restaurants near you that offer healthy choices. If they have a carry-out menu, take it home and plan what you will order ahead of time.  Look up the restaurant you want to eat at online. Many chain and fast-food restaurants list nutritional information online. Use this information to choose the healthiest options and to calculate how many carbohydrates will be in your meal.  Use a carbohydrate-counting book or mobile app to look up the carbohydrate content and serving size of the foods you want to eat.  Become familiar with serving sizes and learn to recognize how many servings are in a portion. This will allow you to estimate how many carbohydrates you can eat. FREE FOODS A "free food" is any food or drink that has less than 5 g of carbohydrates per serving. Free foods include:  Many vegetables.  Hard boiled eggs.  Nuts or seeds.  Olives.  Cheeses.  Meats. These types of foods  make good appetizer choices and are often available at salad bars. Lemon juice, vinegar, or a low-calorie salad dressing of fewer than 20 calories per serving can be used as a "free" salad dressing.  CHOICES TO REDUCE CARBOHYDRATES  Substitute nonfat sweetened yogurt with a sugar-free yogurt. Yogurt made from soy milk may also be used, but you will still want a sugar-free or plain option to choose a lower carbohydrate amount.  Ask your server to take away the bread basket or chips from your table.  Order fresh fruit. A salad bar often offers fresh fruit choices. Avoid canned fruit because it is usually packed in sugar or syrup.  Order a salad, and eat it without dressing. Or, create a "free" salad dressing.  Ask for substitutions. For example, instead of [place] fries, request an order of a vegetable such as salad, green  beans, or broccoli. OTHER TIPS   If you take insulin, take the insulin once your food arrives to your table. This will ensure your insulin and food are timed correctly.  Ask your server about the portion size before your order, and ask for a take-out box if the portion has more servings than you should have. When your food comes, leave the amount you should have on the plate, and put the rest in the take-out box.  Consider splitting an entree with someone and ordering a side salad.   This information is not intended to replace advice given to you by your health care provider. Make sure you discuss any questions you have with your health care provider.   Document Released: 06/04/2005 Document Revised: 02/23/2015 Document Reviewed: 09/01/2013 Elsevier Interactive Patient Education [employer].

## 2015-12-12 NOTE — Progress Notes (Signed)
Sarah [name], is a 60 y.o. female  [id number]  [id number]  DOB - [birth year]  Chief Complaint  Patient presents with  . Follow-up    DM        Subjective:   Tarianna Marze is a 60 y.o. female here today for a follow up visit, here w/ her husband and young 14 y/o grandson. Pt c/o of persistent dysthymia, no energy or incentive "to do anything". She denies depression, no si/hi/avh, but states she has no energy. C/o of tossing/turning all night, and then eventually falls asleep.  Still smoking, zyban did not help.  Denies etoh.  Had held of on colonoscopy last time b/c of copay cost, but amendable to testing now after further explanation about risk/benefits today.  She's quite happy that her kids have all graduated now and out of house.  Pt brought all her meds and I personally reviewed it today.  Patient has No headache, No chest pain, No abdominal pain - No Nausea, No new weakness tingling or numbness, No Cough - SOB.  Problem  DM Neuropathy, Type II Diabetes Mellitus (Hcc)    ALLERGIES: Allergies  Allergen Reactions  . Aspirin Other (See Comments)    Causes her stomach pain.  . Ibuprofen Other (See Comments)    Causes stomach pain  . Penicillins Other (See Comments)    Localized whelps and redness after injection Has patient had a PCN reaction causing immediate rash, facial/tongue/throat swelling, SOB or lightheadedness with hypotension: No Has patient had a PCN reaction causing severe rash involving mucus membranes or skin necrosis: Yes Has patient had a PCN reaction that required hospitalization No Has patient had a PCN reaction occurring within the last 10 years: No If all of the above answers are "NO", then may proceed with Cephalosporin use.     PAST MEDICAL HISTORY: Past Medical History  Diagnosis Date  . Hypertension   . Gastritis     01-13-2015  per ED note  . GERD (gastroesophageal reflux disease)   . Headache   . Arthritis   . COPD (chronic  obstructive pulmonary disease) ([place])   . Type 2 diabetes mellitus ([place])   . History of pulmonary edema     mild -  07-22-2014  post-op  surgery 07-20-2014  . History of acute pancreatitis     01-04-2013   . Acute pancreatitis     01-13-2015 per ED note  . Adrenal adenoma     bilateral  . Ulnar nerve compression     right elbow  . History of DVT of lower extremity     2000  post op left knee surgery  . History of acute congestive heart failure     07-22-2014   post-op surgery 07-20-2014  . Complication of anesthesia     post-op 07-20-2014 surgery post-op Hypoxia due to acute CHF/ mild Pulmonary edema  . Bilateral lower extremity edema   . History of alcohol abuse     state quit 2002/  drank daily >fifth of gin    MEDICATIONS AT HOME: Prior to Admission medications   Medication Sig Start Date End Date Taking? Authorizing Provider  Acetaminophen-Aspirin Buffered (EXCEDRIN BACK & BODY PO) Take 1 tablet by mouth 5 (five) times daily as needed (pain).    Yes Historical Provider, MD  albuterol (PROVENTIL HFA;VENTOLIN HFA) 108 (90 Base) MCG/ACT inhaler Inhale 2 puffs into the lungs every 4 (four) hours as needed for wheezing or shortness of breath. 06/29/15  Yes Jarrett Soho Muthersbaugh, PA-C  allopurinol (ZYLOPRIM) 100 MG tablet Take 1 tablet (100 mg total) by mouth daily. 12/12/15  Yes Maren Reamer, MD  amLODipine (NORVASC) 10 MG tablet Take 1 tablet (10 mg total) by mouth daily. 12/12/15  Yes Maren Reamer, MD  beclomethasone (QVAR) 40 MCG/ACT inhaler Inhale 2 puffs into the lungs daily as needed (for SOB).   Yes Historical Provider, MD  budesonide-formoterol (SYMBICORT) 160-4.5 MCG/ACT inhaler Inhale 2 puffs into the lungs as needed (for SOB).   Yes Historical Provider, MD  hydrochlorothiazide (HYDRODIURIL) 25 MG tablet Take 1 tablet (25 mg total) by mouth daily. D/c 50 mg HCTZ 12/12/15  Yes Maren Reamer, MD  HYDROcodone-acetaminophen (NORCO/VICODIN) 5-325 MG tablet Take 1 tablet by  mouth every 4 (four) hours as needed. 09/09/15  Yes Nona Dell, PA-C  ipratropium (ATROVENT HFA) 17 MCG/ACT inhaler Inhale 2 puffs into the lungs every 6 (six) hours as needed for wheezing. Patient taking differently: Inhale 1 puff into the lungs daily as needed for wheezing.  06/30/15  Yes Josalyn Funches, MD  lisinopril (PRINIVIL,ZESTRIL) 5 MG tablet Take 1 tablet (5 mg total) by mouth every morning. 12/12/15  Yes Maren Reamer, MD  magnesium hydroxide (MILK OF MAGNESIA) 400 MG/5ML suspension Take 30 mLs by mouth daily as needed for mild constipation.   Yes Historical Provider, MD  metFORMIN (GLUCOPHAGE) 500 MG tablet Take 1 tablet (500 mg total) by mouth daily with breakfast. 12/12/15  Yes Maren Reamer, MD  naproxen (NAPROSYN) 500 MG tablet Take 1 tablet (500 mg total) by mouth 2 (two) times daily. 09/09/15  Yes Chesley Noon Nadeau, PA-C  ofloxacin (OCUFLOX) 0.3 % ophthalmic solution Place 1 drop into both eyes 4 (four) times daily. Patient taking differently: Place 1 drop into both eyes daily as needed (burning and blurry vision).  09/16/14  Yes Deepak Advani, MD  olopatadine (PATANOL) 0.1 % ophthalmic solution Place 1 drop into both eyes daily as needed for allergies (itchiness).  04/05/15  Yes Historical Provider, MD  ondansetron (ZOFRAN ODT) 4 MG disintegrating tablet Take 1 tablet (4 mg total) by mouth every 8 (eight) hours as needed for nausea or vomiting. 05/04/15  Yes Mercedes Camprubi-Soms, PA-C  pantoprazole (PROTONIX) 20 MG tablet Take 1 tablet (20 mg total) by mouth daily. 05/04/15  Yes Mercedes Camprubi-Soms, PA-C  polyethylene glycol (MIRALAX / GLYCOLAX) packet Take 17 g by mouth daily as needed for mild constipation (Pt takes 3-4x/week).    Yes Historical Provider, MD  Vitamin D, Ergocalciferol, (DRISDOL) 50000 units CAPS capsule Take 1 capsule (50,000 Units total) by mouth every 7 (seven) days. 09/07/15  Yes Maren Reamer, MD  citalopram (CELEXA) 20 MG tablet Take  1 tablet (20 mg total) by mouth daily. 12/12/15   Maren Reamer, MD  gabapentin (NEURONTIN) 300 MG capsule Take 1 capsule (300 mg total) by mouth 3 (three) times daily. 12/12/15   Maren Reamer, MD  glimepiride (AMARYL) 1 MG tablet Take 1 tablet (1 mg total) by mouth daily with breakfast. 12/12/15   Maren Reamer, MD  ranitidine (ZANTAC) 150 MG tablet Take 1 tablet (150 mg total) by mouth 2 (two) times daily. 12/12/15   Maren Reamer, MD     Objective:   Filed Vitals:   12/12/15 1107  BP: 127/70  Pulse: 81  Temp: 97.7 F (36.5 C)  TempSrc: Oral  Resp: 18  Height: 5\' 8"  (1.727 m)  Weight: 217 lb 12.8 oz (98.793 kg)  SpO2: 97%  Exam General appearance : Awake, alert, not in any distress. Speech Clear. Not toxic looking, pleasant. HEENT: Atraumatic and Normocephalic, pupils equally reactive to light. Neck: supple, no JVD.   Chest:Good air entry bilaterally, no added sounds. CVS: S1 S2 regular, no murmurs/gallups or rubs. Abdomen: Bowel sounds active, Non tender and not distended with no gaurding, rigidity or rebound. Extremities: B/L Lower Ext shows no edema, both legs are warm to touch Neurology: Awake alert, and oriented X 3, CN II-XII grossly intact, Non focal Skin:No Rash  Data Review Lab Results  Component Value Date   HGBA1C 6.4 09/05/2015   HGBA1C 5.80 11/22/2014   HGBA1C 6.3* 07/23/2014    Depression screen PHQ 2/9 09/05/2015  Decreased Interest 0  Down, Depressed, Hopeless 0  PHQ - 2 Score 0      Assessment & Plan   1. 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 - doing very well overall. - reduced amaryl to 1mg  po qday. - metFORMIN (GLUCOPHAGE) 500 MG tablet; Take 1 tablet (500 mg total) by mouth daily with breakfast.  Dispense: 90 tablet; Refill: 3 - Ambulatory referral to Ophthalmology - annual, pt requesting Dr Venetia Maxon - a1c 6.2, glu 94 -  - continue low carb diet and increase exercise  2. Essential hypertension,  benign Renewed rx; DASH diet discussed - hydrochlorothiazide (HYDRODIURIL) 25 MG tablet; Take 1 tablet (25 mg total) by mouth daily. D/c 50 mg HCTZ  Dispense: 90 tablet; Refill: 3 - amLODipine (NORVASC) 10 MG tablet; Take 1 tablet (10 mg total) by mouth daily.  Dispense: 90 tablet; Refill: 5 - lisinopril (PRINIVIL,ZESTRIL) 5 MG tablet; Take 1 tablet (5 mg total) by mouth every morning.  Dispense: 90 tablet; Refill: 3  3. Gout of multiple sites, unspecified cause, unspecified chronicity - allopurinol (ZYLOPRIM) 100 MG tablet; Take 1 tablet (100 mg total) by mouth daily.  Dispense: 90 tablet; Refill: 3  4. Vitamin D deficiency Sp 3 month rx of vit D 50,000, will rechk levels. May just need OTC supplements. - Vitamin D, 25-hydroxy  5. Colon cancer screening - lengthy discussion re: risk/benefit, now willing to do. - Ambulatory referral to Gastroenterology - resent referral  6. Screening for HIV (human immunodeficiency virus) - HIV antibody (with reflex)  7. Need for hepatitis C screening test - per recd for her age grp. - Hepatitis C Ab Reflex HCV RNA, QUANT  8. Other fatigue  - unclear etiology. - pt denies depression, but does not sleep well, c/o of dysthymia as well - welbutrin (used initially for tob abuse and depression) did not help, dc'd - trial low dose citalapram 20qd - sleep study to r/o OSA. - ordered., - Split night study; Future  9. tob abuse - still smoking, continue to encouraged complete cessation, Zyban did not help at all per pt.  10. Health maintance - no longer needs Papsmear - only has ovaries after hysterectomy per pt. - MM up to date, due next year - tdap/pneumovax - does not want at this time.    Patient have been counseled extensively about nutrition and exercise  Return in about 3 months (around 03/13/2016).  The patient was given clear instructions to go to ER or return to medical center if symptoms don't improve, worsen or new problems develop. The  patient verbalized understanding. The patient was told to call to get lab results if they haven't heard anything in the next week.   This note has been created with Museum/gallery curator and  smart Company secretary. Any transcriptional errors are unintentional.   Maren Reamer, MD, [facility] [place], [place]   12/12/2015, 12:01 PM

## 2015-12-12 NOTE — Progress Notes (Signed)
Patient is here for FU DM  Patient complains of bilateral knee pain being present. Right knee scaeld currently at a 9 aand the left knee down to foot is scaled currently at a 4.  Patient request refills on

## 2015-12-13 ENCOUNTER — Other Ambulatory Visit: Payer: Self-pay | Admitting: Internal Medicine

## 2015-12-13 LAB — VITAMIN D 25 HYDROXY (VIT D DEFICIENCY, FRACTURES): Vit D, 25-Hydroxy: 35 ng/mL (ref 30–100)

## 2015-12-13 LAB — HIV ANTIBODY (ROUTINE TESTING W REFLEX): HIV 1&2 Ab, 4th Generation: NONREACTIVE

## 2015-12-13 LAB — HEPATITIS C ANTIBODY: HCV Ab: NEGATIVE

## 2015-12-13 MED ORDER — PRAVASTATIN SODIUM 20 MG PO TABS: 20.0000 mg | ORAL_TABLET | Freq: Every day | ORAL | Status: DC

## 2015-12-13 NOTE — Telephone Encounter (Signed)
-----   Message from Maren Reamer, MD sent at 12/13/2015  9:20 AM EDT ----- Please call w/ results.  Her Hep C and HIV screening was negative.  Vit D levels back to normal.  She can take an OTC vit D of 500-1000mg  daily.  Although her cholesterol levels were within normal range a few months ago, new guidelines recd being on a cholesterol medication anyway as a cardiovasc/stroke prevention medication, so I started her on pravastatin 20 qhs as well. thanks

## 2015-12-13 NOTE — Telephone Encounter (Signed)
MA unable to leave a VM due to system not being set up yet.

## 2015-12-22 ENCOUNTER — Other Ambulatory Visit: Payer: Self-pay | Admitting: Pharmacist

## 2015-12-22 DIAGNOSIS — E139 Other specified diabetes mellitus without complications: Secondary | ICD-10-CM

## 2015-12-22 MED ORDER — METFORMIN HCL 500 MG PO TABS: 500.0000 mg | ORAL_TABLET | Freq: Every day | ORAL | Status: DC

## 2016-02-06 ENCOUNTER — Other Ambulatory Visit: Payer: Self-pay | Admitting: Internal Medicine

## 2016-02-17 ENCOUNTER — Other Ambulatory Visit: Payer: Self-pay | Admitting: Internal Medicine

## 2016-02-17 ENCOUNTER — Other Ambulatory Visit: Payer: Self-pay | Admitting: Family Medicine

## 2016-02-17 DIAGNOSIS — E119 Type 2 diabetes mellitus without complications: Secondary | ICD-10-CM

## 2016-02-17 DIAGNOSIS — M109 Gout, unspecified: Secondary | ICD-10-CM

## 2016-02-17 DIAGNOSIS — I10 Essential (primary) hypertension: Secondary | ICD-10-CM

## 2016-02-17 DIAGNOSIS — M159 Polyosteoarthritis, unspecified: Secondary | ICD-10-CM

## 2016-02-22 ENCOUNTER — Other Ambulatory Visit: Payer: Self-pay | Admitting: Internal Medicine

## 2016-02-22 ENCOUNTER — Other Ambulatory Visit: Payer: Self-pay | Admitting: Family Medicine

## 2016-02-22 DIAGNOSIS — E119 Type 2 diabetes mellitus without complications: Secondary | ICD-10-CM

## 2016-02-22 DIAGNOSIS — M109 Gout, unspecified: Secondary | ICD-10-CM

## 2016-02-22 DIAGNOSIS — I10 Essential (primary) hypertension: Secondary | ICD-10-CM

## 2016-02-22 DIAGNOSIS — M159 Polyosteoarthritis, unspecified: Secondary | ICD-10-CM

## 2016-02-28 ENCOUNTER — Other Ambulatory Visit: Payer: Self-pay | Admitting: Internal Medicine

## 2016-02-28 ENCOUNTER — Telehealth: Payer: Self-pay | Admitting: Internal Medicine

## 2016-02-28 NOTE — Telephone Encounter (Signed)
This request has not been denied, it just has not been processed yet. I have forwarded the request to Dr. Janne Napoleon as the medication was discontinued and she needs to determine if it should be restarted.

## 2016-02-28 NOTE — Telephone Encounter (Signed)
Would like to know why apropion was denied

## 2016-03-05 ENCOUNTER — Other Ambulatory Visit: Payer: Self-pay | Admitting: Internal Medicine

## 2016-03-09 ENCOUNTER — Other Ambulatory Visit: Payer: Self-pay | Admitting: Internal Medicine

## 2016-03-09 ENCOUNTER — Emergency Department (HOSPITAL_COMMUNITY)
Admission: EM | Admit: 2016-03-09 | Discharge: 2016-03-10 | Disposition: A | Discharge status: Home / Self Care | Payer: Medicare Other | Attending: Emergency Medicine | Admitting: Emergency Medicine

## 2016-03-09 ENCOUNTER — Emergency Department (HOSPITAL_COMMUNITY): Payer: Medicare Other

## 2016-03-09 ENCOUNTER — Encounter (HOSPITAL_COMMUNITY): Payer: Self-pay | Admitting: *Deleted

## 2016-03-09 DIAGNOSIS — R05 Cough: Secondary | ICD-10-CM | POA: Diagnosis present

## 2016-03-09 DIAGNOSIS — F17210 Nicotine dependence, cigarettes, uncomplicated: Secondary | ICD-10-CM | POA: Diagnosis not present

## 2016-03-09 DIAGNOSIS — J449 Chronic obstructive pulmonary disease, unspecified: Secondary | ICD-10-CM | POA: Insufficient documentation

## 2016-03-09 DIAGNOSIS — J189 Pneumonia, unspecified organism: Secondary | ICD-10-CM | POA: Insufficient documentation

## 2016-03-09 DIAGNOSIS — Z7984 Long term (current) use of oral hypoglycemic drugs: Secondary | ICD-10-CM | POA: Insufficient documentation

## 2016-03-09 DIAGNOSIS — Z79899 Other long term (current) drug therapy: Secondary | ICD-10-CM | POA: Insufficient documentation

## 2016-03-09 DIAGNOSIS — I10 Essential (primary) hypertension: Secondary | ICD-10-CM | POA: Insufficient documentation

## 2016-03-09 DIAGNOSIS — E119 Type 2 diabetes mellitus without complications: Secondary | ICD-10-CM | POA: Diagnosis not present

## 2016-03-09 LAB — I-STAT CHEM 8, ED
BUN: 9 mg/dL (ref 6–20)
CREATININE: 0.7 mg/dL (ref 0.44–1.00)
Calcium, Ion: 1.19 mmol/L (ref 1.15–1.40)
Chloride: 95 mmol/L — ABNORMAL LOW (ref 101–111)
GLUCOSE: 110 mg/dL — AB (ref 65–99)
HCT: 43 % (ref 36.0–46.0)
HEMOGLOBIN: 14.6 g/dL (ref 12.0–15.0)
POTASSIUM: 3 mmol/L — AB (ref 3.5–5.1)
Sodium: 139 mmol/L (ref 135–145)
TCO2: 31 mmol/L (ref 0–100)

## 2016-03-09 LAB — I-STAT TROPONIN, ED: TROPONIN I, POC: 0 ng/mL (ref 0.00–0.08)

## 2016-03-09 LAB — CBC WITH DIFFERENTIAL/PLATELET
BASOS ABS: 0 10*3/uL (ref 0.0–0.1)
Basophils Relative: 0 %
EOS PCT: 2 %
Eosinophils Absolute: 0.1 10*3/uL (ref 0.0–0.7)
HCT: 39.2 % (ref 36.0–46.0)
Hemoglobin: 13.5 g/dL (ref 12.0–15.0)
LYMPHS PCT: 46 %
Lymphs Abs: 3.2 10*3/uL (ref 0.7–4.0)
MCH: 27.2 pg (ref 26.0–34.0)
MCHC: 34.4 g/dL (ref 30.0–36.0)
MCV: 79 fL (ref 78.0–100.0)
MONO ABS: 0.6 10*3/uL (ref 0.1–1.0)
MONOS PCT: 8 %
Neutro Abs: 3.1 10*3/uL (ref 1.7–7.7)
Neutrophils Relative %: 44 %
PLATELETS: 289 10*3/uL (ref 150–400)
RBC: 4.96 MIL/uL (ref 3.87–5.11)
RDW: 15 % (ref 11.5–15.5)
WBC: 7 10*3/uL (ref 4.0–10.5)

## 2016-03-09 LAB — I-STAT CG4 LACTIC ACID, ED: Lactic Acid, Venous: 0.9 mmol/L (ref 0.5–1.9)

## 2016-03-09 MED ORDER — HYDROCODONE-ACETAMINOPHEN 5-325 MG PO TABS
1.0000 | ORAL_TABLET | Freq: Once | ORAL | Status: AC
  Administered 2016-03-09: 1 via ORAL
  Filled 2016-03-09: qty 1

## 2016-03-09 MED ORDER — POTASSIUM CHLORIDE CRYS ER 20 MEQ PO TBCR
40.0000 meq | EXTENDED_RELEASE_TABLET | Freq: Once | ORAL | Status: AC
  Administered 2016-03-09: 40 meq via ORAL
  Filled 2016-03-09: qty 2

## 2016-03-09 NOTE — ED Triage Notes (Signed)
Pt states she has been coughing up thick yellow sputum. C/o left sided rib pain. Pt also has a right earache.

## 2016-03-09 NOTE — ED Triage Notes (Signed)
Pt c/o pain each time she inhales.

## 2016-03-09 NOTE — ED Provider Notes (Signed)
[facility] DEPT Provider Note   CSN: [id number] Arrival date & time: 03/09/16  1945     History   Chief Complaint Chief Complaint  Patient presents with  . Cough    pt has had a cough times one week. pt c/o left rib pain. Pt has been having fevers on and off.     HPI Sarah [name] is a 60 y.o. female with a past medical history significant for diabetes, GERD, hypertension, CHF, COPD, pancreatitis, and prior DVT who presents with chills, congestion, cough, and left-sided chest pain. Patient describes her chest pain as 9 out of 10 severity and very pleuritic in nature. She describes the pain as sharp. Patient reports associated diaphoresis and productive cough. She says she has had a cough for 1 week, the congestion for 2 weeks, but the chest pain started yesterday.  She reports episodes of epistaxis over the last 3 weeks but denies hemoptysis. Patient denies any recent leg pain or leg swelling. Patient has not taken her temperature but has felt chills. Patient denies any nausea, vomiting, constipation, diarrhea, dysuria, or any other complaints. Patient says she's had a history of pneumonia in the past. Patient denies chest trauma.  The history is provided by the patient, the spouse and medical records. No language interpreter was used.  Chest Pain   This is a new problem. The current episode started 2 days ago. The problem occurs constantly. The problem has been gradually worsening. The pain is associated with coughing and breathing. The pain is present in the lateral region. The pain is at a severity of 9/10. The pain is severe. The quality of the pain is described as pleuritic. The pain does not radiate. The symptoms are aggravated by certain positions and deep breathing. Associated symptoms include cough, diaphoresis, shortness of breath and sputum production. Pertinent negatives include no abdominal pain, no back pain, no fever, no headaches, no hemoptysis, no leg pain, no lower  extremity edema, no nausea, no near-syncope, no palpitations, no syncope, no vomiting and no weakness. She has tried nothing for the symptoms.  Her past medical history is significant for COPD, CHF, diabetes, DVT and hypertension.    Past Medical History:  Diagnosis Date  . Acute pancreatitis    01-13-2015 per ED note  . Adrenal adenoma    bilateral  . Arthritis   . Bilateral lower extremity edema   . Complication of anesthesia    post-op 07-20-2014 surgery post-op Hypoxia due to acute CHF/ mild Pulmonary edema  . COPD (chronic obstructive pulmonary disease) ([place])   . Gastritis    01-13-2015  per ED note  . GERD (gastroesophageal reflux disease)   . Headache   . History of acute congestive heart failure    07-22-2014   post-op surgery 07-20-2014  . History of acute pancreatitis    01-04-2013   . History of alcohol abuse    state quit 2002/  drank daily >fifth of gin  . History of DVT of lower extremity    2000  post op left knee surgery  . History of pulmonary edema    mild -  07-22-2014  post-op  surgery 07-20-2014  . Hypertension   . Type 2 diabetes mellitus ([place])   . Ulnar nerve compression    right elbow    Patient Active Problem List   Diagnosis Date Noted  . DM neuropathy, type II diabetes mellitus ([place]) 12/12/2015  . GERD (gastroesophageal reflux disease) 03/02/2015  . Ileus, postoperative   .  Incarcerated ventral hernia 07/20/2014  . Abdominal pain, unspecified site 01/05/2013  . Pancreatitis, acute 01/05/2013  . Abdominal wall hernia 01/05/2013  . Diabetes type 2, controlled ([place])   . COPD with acute exacerbation ([place]) 06/28/2011  . Hemoptysis 06/28/2011  . HTN (hypertension) 06/27/2011  . TOBACCO ABUSE 02/05/2007  . CONSTIPATION NOS 02/05/2007  . OSTEOARTHROSIS, GENERALIZED, MULTIPLE SITES 02/05/2007  . LOW BACK PAIN 02/05/2007  . HX, URINARY INFECTION 02/05/2007    Past Surgical History:  Procedure Laterality Date  . DILATION AND CURETTAGE OF UTERUS     . INSERTION OF MESH N/A 07/20/2014   Procedure: INSERTION OF MESH;  Surgeon: Fanny Skates, MD;  Location: [facility] ORS;  Service: General;  Laterality: N/A;  . KNEE ARTHROSCOPY Left 2000  . TOTAL ABDOMINAL HYSTERECTOMY W/ BILATERAL SALPINGOOPHORECTOMY  1995  . TRANSTHORACIC ECHOCARDIOGRAM  07-22-2014   mild LVH,  60-65%,  grade I diastolic dysfunction/  mild LAE  . VENTRAL HERNIA REPAIR N/A 07/20/2014   Procedure: LAPAROSCOPIC REPAIR INCARCARATED VENTRAL HERNIA AND UMBILICAL HERNIA;  Surgeon: Fanny Skates, MD;  Location: [facility];  Service: General;  Laterality: N/A;    OB History    No data available       Home Medications    Prior to Admission medications   Medication Sig Start Date End Date Taking? Authorizing Provider  albuterol (PROVENTIL HFA;VENTOLIN HFA) 108 (90 Base) MCG/ACT inhaler Inhale 2 puffs into the lungs every 4 (four) hours as needed for wheezing or shortness of breath. 06/29/15  Yes Hannah Muthersbaugh, PA-C  amLODipine (NORVASC) 10 MG tablet Take 1 tablet (10 mg total) by mouth daily. 12/12/15  Yes Maren Reamer, MD  beclomethasone (QVAR) 40 MCG/ACT inhaler Inhale 2 puffs into the lungs daily as needed (for SOB).   Yes Historical Provider, MD  budesonide-formoterol (SYMBICORT) 160-4.5 MCG/ACT inhaler Inhale 2 puffs into the lungs as needed (for SOB).   Yes Historical Provider, MD  glimepiride (AMARYL) 1 MG tablet Take 1 tablet (1 mg total) by mouth daily with breakfast. 12/12/15  Yes Maren Reamer, MD  hydrochlorothiazide (HYDRODIURIL) 25 MG tablet Take 1 tablet (25 mg total) by mouth daily. D/c 50 mg HCTZ 12/12/15  Yes Maren Reamer, MD  ipratropium (ATROVENT HFA) 17 MCG/ACT inhaler Inhale 2 puffs into the lungs every 6 (six) hours as needed for wheezing. Patient taking differently: Inhale 1 puff into the lungs daily as needed for wheezing.  06/30/15  Yes Josalyn Funches, MD  lisinopril (PRINIVIL,ZESTRIL) 5 MG tablet Take 1 tablet (5 mg total) by mouth every morning.  12/12/15  Yes Maren Reamer, MD  magnesium hydroxide (MILK OF MAGNESIA) 400 MG/5ML suspension Take 30 mLs by mouth daily as needed for mild constipation.   Yes Historical Provider, MD  metFORMIN (GLUCOPHAGE) 500 MG tablet Take 1 tablet (500 mg total) by mouth daily with breakfast. 12/22/15  Yes Maren Reamer, MD  polyethylene glycol (MIRALAX / GLYCOLAX) packet Take 17 g by mouth daily as needed for mild constipation (Pt takes 3-4x/week).    Yes Historical Provider, MD  ranitidine (ZANTAC) 150 MG tablet Take 1 tablet (150 mg total) by mouth 2 (two) times daily. 12/12/15  Yes Dawn Lazarus Gowda, MD  Acetaminophen-Aspirin Buffered (EXCEDRIN BACK & BODY PO) Take 1 tablet by mouth 5 (five) times daily as needed (pain).     Historical Provider, MD  allopurinol (ZYLOPRIM) 100 MG tablet Take 1 tablet (100 mg total) by mouth daily. 12/12/15   Maren Reamer, MD  buPROPion (WELLBUTRIN SR) 150 MG 12 hr tablet TAKE ONE TABLET BY MOUTH TWICE DAILY 02/28/16   Maren Reamer, MD  citalopram (CELEXA) 20 MG tablet Take 1 tablet (20 mg total) by mouth daily. 12/12/15   Maren Reamer, MD  gabapentin (NEURONTIN) 300 MG capsule TAKE ONE CAPSULE BY MOUTH 3 TIMES A DAY 03/05/16   Maren Reamer, MD  HYDROcodone-acetaminophen (NORCO/VICODIN) 5-325 MG tablet Take 1 tablet by mouth every 4 (four) hours as needed. 09/09/15   Nona Dell, PA-C  naproxen (NAPROSYN) 500 MG tablet Take 1 tablet (500 mg total) by mouth 2 (two) times daily. 09/09/15   Nona Dell, PA-C  ofloxacin (OCUFLOX) 0.3 % ophthalmic solution Place 1 drop into both eyes 4 (four) times daily. Patient taking differently: Place 1 drop into both eyes daily as needed (burning and blurry vision).  09/16/14   Lorayne Marek, MD  olopatadine (PATANOL) 0.1 % ophthalmic solution Place 1 drop into both eyes daily as needed for allergies (itchiness).  04/05/15   Historical Provider, MD  ondansetron (ZOFRAN ODT) 4 MG disintegrating tablet Take 1  tablet (4 mg total) by mouth every 8 (eight) hours as needed for nausea or vomiting. 05/04/15   Mercedes Camprubi-Soms, PA-C  pravastatin (PRAVACHOL) 20 MG tablet Take 1 tablet (20 mg total) by mouth daily. 12/13/15   Maren Reamer, MD    Family History History reviewed. No pertinent family history.  Social History Social History  Substance Use Topics  . Smoking status: Current Every Day Smoker    Packs/day: 1.50    Years: 40.00    Types: Cigarettes  . Smokeless tobacco: Never Used  . Alcohol use No     Comment: hx alcohol abuse- drank >fifth of gin daily/  states Quit 2002     Allergies   Aspirin; Ibuprofen; and Penicillins   Review of Systems Review of Systems  Constitutional: Positive for chills and diaphoresis. Negative for appetite change and fever.  HENT: Positive for congestion, nosebleeds and rhinorrhea.   Eyes: Negative for visual disturbance.  Respiratory: Positive for cough, sputum production, chest tightness and shortness of breath. Negative for hemoptysis, wheezing and stridor.   Cardiovascular: Positive for chest pain. Negative for palpitations, leg swelling, syncope and near-syncope.  Gastrointestinal: Negative for abdominal pain, constipation, diarrhea, nausea and vomiting.  Genitourinary: Negative for flank pain.  Musculoskeletal: Negative for back pain, neck pain and neck stiffness.  Skin: Negative for wound.  Neurological: Negative for weakness, light-headedness and headaches.  Psychiatric/Behavioral: Negative for agitation.  All other systems reviewed and are negative.    Physical Exam Updated Vital Signs BP 121/61 (BP Location: Left Arm)   Pulse 88   Temp 98.8 F (37.1 C) (Oral)   Resp 20   Ht 5\' 8"  (1.727 m)   Wt 190 lb (86.2 kg)   SpO2 99%   BMI 28.89 kg/m   Physical Exam  Constitutional: She is oriented to person, place, and time. She appears well-developed and well-nourished. No distress.  HENT:  Head: Normocephalic and atraumatic.    Mouth/Throat: Oropharynx is clear and moist. No oropharyngeal exudate.  Eyes: Conjunctivae and EOM are normal. Pupils are equal, round, and reactive to light.  Neck: Normal range of motion. Neck supple.  Cardiovascular: Normal rate and regular rhythm.   No murmur heard. Pulmonary/Chest: Effort normal and breath sounds normal. No stridor. No respiratory distress. She has no wheezes. She has no rales. She exhibits tenderness.  Abdominal: Soft. There is no tenderness.  Musculoskeletal: She exhibits no edema.  Neurological: She is alert and oriented to person, place, and time. No cranial nerve deficit. She exhibits normal muscle tone.  Skin: Skin is warm and dry. Capillary refill takes less than 2 seconds.  Psychiatric: She has a normal mood and affect.  Nursing note and vitals reviewed.    ED Treatments / Results  Labs (all labs ordered are listed, but only abnormal results are displayed) Labs Reviewed  I-STAT CHEM 8, ED - Abnormal; Notable for the following:       Result Value   Potassium 3.0 (*)    Chloride 95 (*)    Glucose, Bld 110 (*)    All other components within normal limits  CBC WITH DIFFERENTIAL/PLATELET  Randolm Idol, ED  I-STAT CG4 LACTIC ACID, ED    EKG  EKG Interpretation  Date/Time:  Friday March 09 2016 23:41:56 EDT Ventricular Rate:  80 PR Interval:    QRS Duration: 90 QT Interval:  415 QTC Calculation: 479 R Axis:   37 Text Interpretation:  Sinus arrhythmia Borderline low voltage, extremity leads RSR' in V1 or V2, probably normal variant appears similar to prior.  Confirmed by Sherry Ruffing MD, [place] [phone]) on 03/09/2016 11:50:57 PM       Radiology Dg Chest 2 View  Result Date: 03/09/2016 CLINICAL DATA:  Acute onset of productive cough and left-sided chest pain. Initial encounter. EXAM: CHEST  2 VIEW COMPARISON:  Chest radiograph performed 06/29/2015 FINDINGS: The lungs are well-aerated. Vascular congestion is noted. Mild left basilar airspace  opacity may reflect pneumonia, slightly more prominent than on the prior study. Underlying chronically increased interstitial markings are seen. There is no evidence of pleural effusion or pneumothorax. The heart is borderline normal in size. No acute osseous abnormalities are seen. IMPRESSION: Vascular congestion noted. Mild left basilar airspace opacity may reflect pneumonia, slightly more prominent than on the prior study. Underlying chronically increased interstitial markings seen. Electronically Signed   By: Garald Balding M.D.   On: 03/09/2016 21:01    Procedures Procedures (including critical care time)  Medications Ordered in ED Medications  HYDROcodone-acetaminophen (NORCO/VICODIN) 5-325 MG per tablet 1 tablet (1 tablet Oral Given 03/09/16 2350)  potassium chloride SA (K-DUR,KLOR-CON) CR tablet 40 mEq (40 mEq Oral Given 03/09/16 2353)  iopamidol (ISOVUE-370) 76 % injection 100 mL (100 mLs Intravenous Contrast Given 03/10/16 0021)     Initial Impression / Assessment and Plan / ED Course  I have reviewed the triage vital signs and the nursing notes.  Pertinent labs & imaging results that were available during my care of the patient were reviewed by me and considered in my medical decision making (see chart for details).  Clinical Course   Sarah [name] is a 60 y.o. female with a past medical history significant for diabetes, GERD, hypertension, CHF, COPD, pancreatitis, and prior DVT who presents with chills, congestion, cough, and left-sided chest pain. History and exam as seen above. Initial consideration is for pneumonia however, given considerable chest pain that patient describes as pleuritic as well as history of DVT, pulmonary embolism strongly considered. Given patient's risk factors, CT scan ordered as well as other workup.  Patient reports that during previous admission, IV narcotic medications made her "sleep for days". Patient requested oral Vicodin for pain control. Oral  hydrocodone was ordered.  EKG did not show any signs of acute ischemia and it appeared similar to prior. Initial chest x-ray showed concern for possible pneumonia however, CBC showed no leukocytosis and normal  lactic acid. PE still considered as well as possible PNA. Patient found a low potassium which was orally supplemented. Initial troponin negative.  CT scan negative for pulmonary embolism. Patient was found to have increase in number of nodules which the patient was informed of.  Given the chest x-ray showing concern for possible pneumonia, the patient's productive cough and clinical suspicion of PNA, patient will be started on antibiotics for community acquired pneumonia. She given several pills of pain medication for her left-sided chest wall pain. Patient given instructions to follow-up with PCP next several days as well as return to the ED should any new symptoms. Patient understood plan of care, had no other questions or concerns, and was discharged in good condition.    Final Clinical Impressions(s) / ED Diagnoses   Final diagnoses:  Community acquired pneumonia    New Prescriptions New Prescriptions   HYDROCODONE-ACETAMINOPHEN (NORCO/VICODIN) 5-325 MG TABLET    Take 1 tablet by mouth every 6 (six) hours as needed.   LEVOFLOXACIN (LEVAQUIN) 750 MG TABLET    Take 1 tablet (750 mg total) by mouth daily.    Clinical Impression: 1. Community acquired pneumonia     Disposition: Discharge  Condition: Good  I have discussed the results, Dx and Tx plan with the pt(& family if present). He/she/they expressed understanding and agree(s) with the plan. Discharge instructions discussed at great length. Strict return precautions discussed and pt &/or family have verbalized understanding of the instructions. No further questions at time of discharge.    New Prescriptions   HYDROCODONE-ACETAMINOPHEN (NORCO/VICODIN) 5-325 MG TABLET    Take 1 tablet by mouth every 6 (six) hours as needed.     LEVOFLOXACIN (LEVAQUIN) 750 MG TABLET    Take 1 tablet (750 mg total) by mouth daily.    Follow Up: Maren Reamer, MD [place]  [phone]  Schedule an appointment as soon as possible for a visit  If symptoms worsen, please return to the nearest ED.     Gwenyth Allegra Jacquez Sheetz, MD 03/10/16 [phone]

## 2016-03-10 ENCOUNTER — Emergency Department (HOSPITAL_COMMUNITY): Payer: Medicare Other

## 2016-03-10 ENCOUNTER — Encounter (HOSPITAL_COMMUNITY): Payer: Self-pay | Admitting: Radiology

## 2016-03-10 DIAGNOSIS — J189 Pneumonia, unspecified organism: Secondary | ICD-10-CM | POA: Diagnosis not present

## 2016-03-10 MED ORDER — IOPAMIDOL (ISOVUE-370) INJECTION 76%
100.0000 mL | Freq: Once | INTRAVENOUS | Status: AC | PRN
  Administered 2016-03-10: 100 mL via INTRAVENOUS

## 2016-03-10 MED ORDER — LEVOFLOXACIN 750 MG PO TABS
750.0000 mg | ORAL_TABLET | Freq: Every day | ORAL | 0 refills | Status: AC
Start: 2016-03-10 — End: 2016-03-15

## 2016-03-10 MED ORDER — LEVOFLOXACIN 750 MG PO TABS
750.0000 mg | ORAL_TABLET | Freq: Once | ORAL | Status: AC
  Administered 2016-03-10: 750 mg via ORAL
  Filled 2016-03-10: qty 1

## 2016-03-10 MED ORDER — HYDROCODONE-ACETAMINOPHEN 5-325 MG PO TABS: 1.0000 | ORAL_TABLET | Freq: Four times a day (QID) | ORAL | 0 refills | Status: DC | PRN

## 2016-03-23 ENCOUNTER — Other Ambulatory Visit: Payer: Self-pay | Admitting: Internal Medicine

## 2016-03-23 ENCOUNTER — Other Ambulatory Visit: Payer: Self-pay | Admitting: Family Medicine

## 2016-03-23 DIAGNOSIS — M159 Polyosteoarthritis, unspecified: Secondary | ICD-10-CM

## 2016-03-23 DIAGNOSIS — I10 Essential (primary) hypertension: Secondary | ICD-10-CM

## 2016-03-23 DIAGNOSIS — M109 Gout, unspecified: Secondary | ICD-10-CM

## 2016-03-23 DIAGNOSIS — E119 Type 2 diabetes mellitus without complications: Secondary | ICD-10-CM

## 2016-04-22 ENCOUNTER — Other Ambulatory Visit: Payer: Self-pay | Admitting: Internal Medicine

## 2016-04-22 DIAGNOSIS — M109 Gout, unspecified: Secondary | ICD-10-CM

## 2016-04-22 DIAGNOSIS — E119 Type 2 diabetes mellitus without complications: Secondary | ICD-10-CM

## 2016-04-22 DIAGNOSIS — I10 Essential (primary) hypertension: Secondary | ICD-10-CM

## 2016-04-30 ENCOUNTER — Other Ambulatory Visit: Payer: Self-pay | Admitting: Internal Medicine

## 2016-05-14 ENCOUNTER — Other Ambulatory Visit: Payer: Self-pay | Admitting: Pharmacist

## 2016-05-14 DIAGNOSIS — I10 Essential (primary) hypertension: Secondary | ICD-10-CM

## 2016-05-14 MED ORDER — HYDROCHLOROTHIAZIDE 25 MG PO TABS: 25.0000 mg | ORAL_TABLET | Freq: Every day | ORAL | 0 refills | Status: DC

## 2016-05-17 ENCOUNTER — Ambulatory Visit: Payer: Medicare Other | Attending: Internal Medicine | Admitting: Pharmacist

## 2016-05-17 DIAGNOSIS — Z23 Encounter for immunization: Secondary | ICD-10-CM | POA: Diagnosis not present

## 2016-05-22 ENCOUNTER — Other Ambulatory Visit: Payer: Self-pay | Admitting: Internal Medicine

## 2016-05-22 DIAGNOSIS — M159 Polyosteoarthritis, unspecified: Secondary | ICD-10-CM

## 2016-05-22 DIAGNOSIS — Z1231 Encounter for screening mammogram for malignant neoplasm of breast: Secondary | ICD-10-CM

## 2016-05-22 DIAGNOSIS — E119 Type 2 diabetes mellitus without complications: Secondary | ICD-10-CM

## 2016-06-13 ENCOUNTER — Ambulatory Visit: Payer: Medicare Other

## 2016-06-21 ENCOUNTER — Other Ambulatory Visit: Payer: Self-pay

## 2016-06-21 DIAGNOSIS — I10 Essential (primary) hypertension: Secondary | ICD-10-CM

## 2016-06-21 MED ORDER — HYDROCHLOROTHIAZIDE 25 MG PO TABS: 25.0000 mg | ORAL_TABLET | Freq: Every day | ORAL | 0 refills | Status: DC

## 2016-06-26 ENCOUNTER — Other Ambulatory Visit: Payer: Self-pay | Admitting: Internal Medicine

## 2016-06-26 DIAGNOSIS — E119 Type 2 diabetes mellitus without complications: Secondary | ICD-10-CM

## 2016-07-02 ENCOUNTER — Ambulatory Visit: Payer: Medicare Other | Attending: Internal Medicine | Admitting: Internal Medicine

## 2016-07-02 ENCOUNTER — Encounter: Payer: Self-pay | Admitting: Internal Medicine

## 2016-07-02 VITALS — BP 131/69 | HR 78 | Temp 97.6°F | Resp 16 | Wt 224.2 lb

## 2016-07-02 DIAGNOSIS — Z1211 Encounter for screening for malignant neoplasm of colon: Secondary | ICD-10-CM

## 2016-07-02 DIAGNOSIS — J441 Chronic obstructive pulmonary disease with (acute) exacerbation: Secondary | ICD-10-CM | POA: Insufficient documentation

## 2016-07-02 DIAGNOSIS — I110 Hypertensive heart disease with heart failure: Secondary | ICD-10-CM | POA: Diagnosis present

## 2016-07-02 DIAGNOSIS — R040 Epistaxis: Secondary | ICD-10-CM | POA: Diagnosis not present

## 2016-07-02 DIAGNOSIS — F17210 Nicotine dependence, cigarettes, uncomplicated: Secondary | ICD-10-CM | POA: Diagnosis not present

## 2016-07-02 DIAGNOSIS — R7303 Prediabetes: Secondary | ICD-10-CM | POA: Diagnosis not present

## 2016-07-02 DIAGNOSIS — I509 Heart failure, unspecified: Secondary | ICD-10-CM | POA: Diagnosis not present

## 2016-07-02 DIAGNOSIS — M109 Gout, unspecified: Secondary | ICD-10-CM | POA: Diagnosis not present

## 2016-07-02 DIAGNOSIS — Z880 Allergy status to penicillin: Secondary | ICD-10-CM | POA: Diagnosis not present

## 2016-07-02 DIAGNOSIS — I10 Essential (primary) hypertension: Secondary | ICD-10-CM | POA: Diagnosis not present

## 2016-07-02 DIAGNOSIS — Z7982 Long term (current) use of aspirin: Secondary | ICD-10-CM | POA: Diagnosis not present

## 2016-07-02 DIAGNOSIS — Z720 Tobacco use: Secondary | ICD-10-CM

## 2016-07-02 DIAGNOSIS — Z7984 Long term (current) use of oral hypoglycemic drugs: Secondary | ICD-10-CM | POA: Diagnosis not present

## 2016-07-02 DIAGNOSIS — Z79899 Other long term (current) drug therapy: Secondary | ICD-10-CM | POA: Diagnosis not present

## 2016-07-02 DIAGNOSIS — E785 Hyperlipidemia, unspecified: Secondary | ICD-10-CM | POA: Diagnosis not present

## 2016-07-02 LAB — BASIC METABOLIC PANEL WITH GFR
BUN: 13 mg/dL (ref 7–25)
CALCIUM: 10 mg/dL (ref 8.6–10.4)
CHLORIDE: 99 mmol/L (ref 98–110)
CO2: 28 mmol/L (ref 20–31)
Creat: 0.83 mg/dL (ref 0.50–0.99)
GFR, EST NON AFRICAN AMERICAN: 76 mL/min (ref >=60)
GFR, Est African American: 87 mL/min (ref >=60)
GLUCOSE: 111 mg/dL — AB (ref 65–99)
POTASSIUM: 3.6 mmol/L (ref 3.5–5.3)
SODIUM: 137 mmol/L (ref 135–146)

## 2016-07-02 LAB — POCT GLYCOSYLATED HEMOGLOBIN (HGB A1C): HEMOGLOBIN A1C: 6.2

## 2016-07-02 LAB — CBC WITH DIFFERENTIAL/PLATELET
BASOS PCT: 0 %
Basophils Absolute: 0 cells/uL (ref 0–200)
EOS PCT: 2 %
Eosinophils Absolute: 126 cells/uL (ref 15–500)
HEMATOCRIT: 41.6 % (ref 35.0–45.0)
HEMOGLOBIN: 13.8 g/dL (ref 11.7–15.5)
LYMPHS ABS: 2583 {cells}/uL (ref 850–3900)
Lymphocytes Relative: 41 %
MCH: 26.3 pg — ABNORMAL LOW (ref 27.0–33.0)
MCHC: 33.2 g/dL (ref 32.0–36.0)
MCV: 79.2 fL — ABNORMAL LOW (ref 80.0–100.0)
MONO ABS: 441 {cells}/uL (ref 200–950)
MPV: 10.4 fL (ref 7.5–12.5)
Monocytes Relative: 7 %
NEUTROS ABS: 3150 {cells}/uL (ref 1500–7800)
Neutrophils Relative %: 50 %
Platelets: 308 10*3/uL (ref 140–400)
RBC: 5.25 MIL/uL — AB (ref 3.80–5.10)
RDW: 15.5 % — ABNORMAL HIGH (ref 11.0–15.0)
WBC: 6.3 10*3/uL (ref 3.8–10.8)

## 2016-07-02 LAB — GLUCOSE, POCT (MANUAL RESULT ENTRY): POC GLUCOSE: 127 mg/dL — AB (ref 70–99)

## 2016-07-02 MED ORDER — AMLODIPINE BESYLATE 10 MG PO TABS: 10.0000 mg | ORAL_TABLET | Freq: Every day | ORAL | 3 refills | Status: DC

## 2016-07-02 MED ORDER — HYDROCHLOROTHIAZIDE 25 MG PO TABS: 25.0000 mg | ORAL_TABLET | Freq: Every day | ORAL | 3 refills | Status: DC

## 2016-07-02 MED ORDER — ALBUTEROL SULFATE HFA 108 (90 BASE) MCG/ACT IN AERS: 2.0000 | INHALATION_SPRAY | RESPIRATORY_TRACT | 3 refills | Status: DC | PRN

## 2016-07-02 MED ORDER — GLIMEPIRIDE 1 MG PO TABS: 1.0000 mg | ORAL_TABLET | Freq: Every day | ORAL | 3 refills | Status: DC

## 2016-07-02 MED ORDER — PREDNISONE 20 MG PO TABS: 60.0000 mg | ORAL_TABLET | Freq: Every day | ORAL | 0 refills | Status: DC

## 2016-07-02 MED ORDER — ALBUTEROL SULFATE HFA 108 (90 BASE) MCG/ACT IN AERS
2.0000 | INHALATION_SPRAY | RESPIRATORY_TRACT | 3 refills | Status: DC | PRN
Start: 2016-07-02 — End: 2016-07-02

## 2016-07-02 MED ORDER — SALINE SPRAY 0.65 % NA SOLN: 2.0000 | NASAL | 11 refills | Status: DC | PRN

## 2016-07-02 MED ORDER — ALLOPURINOL 100 MG PO TABS: 100.0000 mg | ORAL_TABLET | Freq: Every day | ORAL | 3 refills | Status: DC

## 2016-07-02 MED ORDER — ASPIRIN EC 81 MG PO TBEC
81.0000 mg | DELAYED_RELEASE_TABLET | Freq: Every day | ORAL | 3 refills | Status: DC
Start: 2016-07-02 — End: 2024-02-28

## 2016-07-02 MED ORDER — NICOTINE 21 MG/24HR TD PT24: 21.0000 mg | MEDICATED_PATCH | Freq: Every day | TRANSDERMAL | 0 refills | Status: DC

## 2016-07-02 MED ORDER — LISINOPRIL 5 MG PO TABS: 5.0000 mg | ORAL_TABLET | Freq: Every morning | ORAL | 3 refills | Status: DC

## 2016-07-02 MED ORDER — PRAVASTATIN SODIUM 20 MG PO TABS: 20.0000 mg | ORAL_TABLET | Freq: Every day | ORAL | 3 refills | Status: DC

## 2016-07-02 MED ORDER — NICOTINE POLACRILEX 4 MG MT GUM
4.0000 mg | CHEWING_GUM | OROMUCOSAL | 0 refills | Status: DC | PRN
Start: 2016-07-02 — End: 2017-11-08

## 2016-07-02 MED ORDER — BUDESONIDE-FORMOTEROL FUMARATE 160-4.5 MCG/ACT IN AERO: 2.0000 | INHALATION_SPRAY | Freq: Two times a day (BID) | RESPIRATORY_TRACT | 11 refills | Status: DC

## 2016-07-02 MED ORDER — METFORMIN HCL 500 MG PO TABS: 500.0000 mg | ORAL_TABLET | Freq: Every day | ORAL | 3 refills | Status: DC

## 2016-07-02 NOTE — Patient Instructions (Addendum)
Steps to Quit Smoking Smoking tobacco can be bad for your health. It can also affect almost every organ in your body. Smoking puts you and people around you at risk for many serious long-lasting (chronic) diseases. Quitting smoking is hard, but it is one of the best things that you can do for your health. It is never too late to quit. What are the benefits of quitting smoking? When you quit smoking, you lower your risk for getting serious diseases and conditions. They can include:  Lung cancer or lung disease.  Heart disease.  Stroke.  Heart attack.  Not being able to have children (infertility).  Weak bones (osteoporosis) and broken bones (fractures). If you have coughing, wheezing, and shortness of breath, those symptoms may get better when you quit. You may also get sick less often. If you are pregnant, quitting smoking can help to lower your chances of having a baby of low birth weight. What can I do to help me quit smoking? Talk with your doctor about what can help you quit smoking. Some things you can do (strategies) include:  Quitting smoking totally, instead of slowly cutting back how much you smoke over a period of time.  Going to in-person counseling. You are more likely to quit if you go to many counseling sessions.  Using resources and support systems, such as:  Online chats with a counselor.  Phone quitlines.  Printed self-help materials.  Support groups or group counseling.  Text messaging programs.  Mobile phone apps or applications.  Taking medicines. Some of these medicines may have nicotine in them. If you are pregnant or breastfeeding, do not take any medicines to quit smoking unless your doctor says it is okay. Talk with your doctor about counseling or other things that can help you. Talk with your doctor about using more than one strategy at the same time, such as taking medicines while you are also going to in-person counseling. This can help make quitting  easier. What things can I do to make it easier to quit? Quitting smoking might feel very hard at first, but there is a lot that you can do to make it easier. Take these steps:  Talk to your family and friends. Ask them to support and encourage you.  Call phone quitlines, reach out to support groups, or work with a counselor.  Ask people who smoke to not smoke around you.  Avoid places that make you want (trigger) to smoke, such as:  Bars.  Parties.  Smoke-break areas at work.  Spend time with people who do not smoke.  Lower the stress in your life. Stress can make you want to smoke. Try these things to help your stress:  Getting regular exercise.  Deep-breathing exercises.  Yoga.  Meditating.  Doing a body scan. To do this, close your eyes, focus on one area of your body at a time from head to toe, and notice which parts of your body are tense. Try to relax the muscles in those areas.  Download or buy apps on your mobile phone or tablet that can help you stick to your quit plan. There are many free apps, such as QuitGuide from the CDC (Centers for Disease Control and Prevention). You can find more support from smokefree.gov and other websites. This information is not intended to replace advice given to you by your health care provider. Make sure you discuss any questions you have with your health care provider. Document Released: 03/31/2009 Document Revised: 01/31/2016 Document   Reviewed: 10/19/2014 Elsevier Interactive Patient Education  2017 [employer]  

## 2016-07-02 NOTE — Addendum Note (Signed)
Addended byLottie Mussel T on: 07/02/2016 11:35 AM   Modules accepted: Orders

## 2016-07-02 NOTE — Progress Notes (Signed)
Sarah [name], is a 61 y.o. female  [id number]  [id number]  DOB - 12-17-1955  Chief Complaint  Patient presents with  . Hypertension  . Diabetes        Subjective:   Sarah [name] is a 61 y.o. female here today for a follow up visit, w/ hx of dm, htn, tob abuse.  Last seen by me 12/12/15, was in ED 9/17 for pna as well. Per pt, since treatment for pna, feels unchanged, still w/ intermittent coughing/congestion. Afebrile. Still smoking 1ppd, once to stop, but very nicoderm dependent. Asked for nicorette gum or nicotine patch if covered by insurance.  Currently out of her inhalers for sometime. Denies gout flairs, but gets joint pains bilat upper extrem arms and her right knee. She has hx of left knee injury requiring surgery, and since than became more dependent on right knee, thus causing injury to her right knee. Right knee gets stiff and swollen at times, does not walk w/ walker.  Co of epistaxis in right nostril at times in am.  Pt brought in her pill box dispenser, takes 1 packet a day.  Very organized.  Patient has No headache, No chest pain, No abdominal pain - No Nausea, No new weakness tingling or numbness, SOB.  No problems updated.  ALLERGIES: Allergies  Allergen Reactions  . Aspirin Other (See Comments)    Causes her stomach pain.  . Ibuprofen Other (See Comments)    Causes stomach pain  . Penicillins Other (See Comments)    Localized whelps and redness after injection Has patient had a PCN reaction causing immediate rash, facial/tongue/throat swelling, SOB or lightheadedness with hypotension: No Has patient had a PCN reaction causing severe rash involving mucus membranes or skin necrosis: Yes Has patient had a PCN reaction that required hospitalization No Has patient had a PCN reaction occurring within the last 10 years: No If all of the above answers are "NO", then may proceed with Cephalosporin use.     PAST MEDICAL HISTORY: Past Medical History:    Diagnosis Date  . Acute pancreatitis    01-13-2015 per ED note  . Adrenal adenoma    bilateral  . Arthritis   . Bilateral lower extremity edema   . Complication of anesthesia    post-op 07-20-2014 surgery post-op Hypoxia due to acute CHF/ mild Pulmonary edema  . COPD (chronic obstructive pulmonary disease) ([place])   . Gastritis    01-13-2015  per ED note  . GERD (gastroesophageal reflux disease)   . Headache   . History of acute congestive heart failure    07-22-2014   post-op surgery 07-20-2014  . History of acute pancreatitis    01-04-2013   . History of alcohol abuse    state quit 2002/  drank daily >fifth of gin  . History of DVT of lower extremity    2000  post op left knee surgery  . History of pulmonary edema    mild -  07-22-2014  post-op  surgery 07-20-2014  . Hypertension   . Type 2 diabetes mellitus ([place])   . Ulnar nerve compression    right elbow    MEDICATIONS AT HOME: Prior to Admission medications   Medication Sig Start Date End Date Taking? Authorizing Provider  albuterol (PROVENTIL HFA;VENTOLIN HFA) 108 (90 Base) MCG/ACT inhaler Inhale 2 puffs into the lungs every 4 (four) hours as needed for wheezing or shortness of breath. 07/02/16  Yes Maren Reamer, MD  allopurinol (ZYLOPRIM) 100 MG tablet Take  1 tablet (100 mg total) by mouth daily. 07/02/16  Yes Maren Reamer, MD  amLODipine (NORVASC) 10 MG tablet Take 1 tablet (10 mg total) by mouth daily. 07/02/16  Yes Maren Reamer, MD  hydrochlorothiazide (HYDRODIURIL) 25 MG tablet Take 1 tablet (25 mg total) by mouth daily. 07/02/16  Yes Maren Reamer, MD  metFORMIN (GLUCOPHAGE) 500 MG tablet Take 1 tablet (500 mg total) by mouth daily with breakfast. 07/02/16  Yes Maren Reamer, MD  ranitidine (ZANTAC) 150 MG tablet TAKE ONE TABLET BY MOUTH TWICE DAILY 04/30/16  Yes Maren Reamer, MD  Acetaminophen-Aspirin Buffered (EXCEDRIN BACK & BODY PO) Take 1 tablet by mouth 5 (five) times daily as needed (pain).      Historical Provider, MD  aspirin EC 81 MG tablet Take 1 tablet (81 mg total) by mouth daily. 07/02/16   Maren Reamer, MD  beclomethasone (QVAR) 40 MCG/ACT inhaler Inhale 2 puffs into the lungs daily as needed (for SOB).    Historical Provider, MD  budesonide-formoterol (SYMBICORT) 160-4.5 MCG/ACT inhaler Inhale 2 puffs into the lungs 2 (two) times daily. 07/02/16   Maren Reamer, MD  buPROPion (WELLBUTRIN SR) 150 MG 12 hr tablet TAKE ONE TABLET BY MOUTH TWICE DAILY Patient not taking: Reported on 07/02/2016 03/12/16   Maren Reamer, MD  citalopram (CELEXA) 20 MG tablet Take 1 tablet (20 mg total) by mouth daily. Patient not taking: Reported on 07/02/2016 12/12/15   Maren Reamer, MD  gabapentin (NEURONTIN) 300 MG capsule TAKE ONE CAPSULE BY MOUTH 3 TIMES A DAY Patient not taking: Reported on 07/02/2016 03/05/16   Maren Reamer, MD  glimepiride (AMARYL) 1 MG tablet Take 1 tablet (1 mg total) by mouth daily with breakfast. 07/02/16   Maren Reamer, MD  HYDROcodone-acetaminophen (NORCO/VICODIN) 5-325 MG tablet Take 1 tablet by mouth every 4 (four) hours as needed. Patient not taking: Reported on 07/02/2016 09/09/15   Nona Dell, PA-C  HYDROcodone-acetaminophen (NORCO/VICODIN) 5-325 MG tablet Take 1 tablet by mouth every 6 (six) hours as needed. Patient not taking: Reported on 07/02/2016 03/10/16   Gwenyth Allegra Tegeler, MD  ipratropium (ATROVENT HFA) 17 MCG/ACT inhaler Inhale 2 puffs into the lungs every 6 (six) hours as needed for wheezing. Patient not taking: Reported on 07/02/2016 06/30/15   Boykin Nearing, MD  lisinopril (PRINIVIL,ZESTRIL) 5 MG tablet Take 1 tablet (5 mg total) by mouth every morning. 07/02/16   Maren Reamer, MD  magnesium hydroxide (MILK OF MAGNESIA) 400 MG/5ML suspension Take 30 mLs by mouth daily as needed for mild constipation.    Historical Provider, MD  naproxen (NAPROSYN) 500 MG tablet Take 1 tablet (500 mg total) by mouth 2 (two) times  daily. Patient not taking: Reported on 07/02/2016 09/09/15   Nona Dell, PA-C  nicotine (NICODERM CQ) 21 mg/24hr patch Place 1 patch (21 mg total) onto the skin daily. 07/02/16   Maren Reamer, MD  nicotine polacrilex (NICORETTE) 4 MG gum Take 1 each (4 mg total) by mouth as needed for smoking cessation. 07/02/16   Maren Reamer, MD  ofloxacin (OCUFLOX) 0.3 % ophthalmic solution Place 1 drop into both eyes 4 (four) times daily. Patient not taking: Reported on 07/02/2016 09/16/14   Lorayne Marek, MD  olopatadine (PATANOL) 0.1 % ophthalmic solution Place 1 drop into both eyes daily as needed for allergies (itchiness).  04/05/15   Historical Provider, MD  ondansetron (ZOFRAN ODT) 4 MG disintegrating tablet Take 1 tablet (4  mg total) by mouth every 8 (eight) hours as needed for nausea or vomiting. Patient not taking: Reported on 07/02/2016 05/04/15   [place], PA-C  polyethylene glycol [facility] / Floria Raveling) packet Take 17 g by mouth daily as needed for mild constipation (Pt takes 3-4x/week).     Historical Provider, MD  pravastatin (PRAVACHOL) 20 MG tablet Take 1 tablet (20 mg total) by mouth daily. 07/02/16   Maren Reamer, MD  predniSONE (DELTASONE) 20 MG tablet Take 3 tablets (60 mg total) by mouth daily with breakfast. Day 1-3, take 60mg  (=3 tabs) qam, day 4-7 take 40mg  qam (=2 tabs), day 8-10 take 1 tab daily, than stop 07/02/16   Maren Reamer, MD  sodium chloride (OCEAN) 0.65 % SOLN nasal spray Place 2 sprays into both nostrils as needed for congestion. 07/02/16   Maren Reamer, MD     Objective:   Vitals:   07/02/16 0943  BP: 131/69  Pulse: 78  Resp: 16  Temp: 97.6 F (36.4 C)  TempSrc: Oral  SpO2: 96%  Weight: 224 lb 3.2 oz (101.7 kg)    Exam General appearance : Awake, alert, not in any distress. Speech Clear. Not toxic looking, pleasant.  Smells of tob. Smoke. HEENT: Atraumatic and Normocephalic, pupils equally reactive to light.  bilat TMs clear.    Neck: supple, no JVD.  Chest:Good air entry bilaterally, no added sounds. CVS: S1 S2 regular, no murmurs/gallups or rubs. Abdomen: Bowel sounds active, Non tender and not distended with no gaurding, rigidity or rebound. Extremities: B/L Lower Ext shows no edema, both legs are warm to touch Neurology: Awake alert, and oriented X 3, CN II-XII grossly intact, Non focal Skin:No Rash  Data Review Lab Results  Component Value Date   HGBA1C 6.2 07/02/2016   HGBA1C 6.4 09/05/2015   HGBA1C 5.80 11/22/2014    Depression screen PHQ 2/9 07/02/2016 09/05/2015  Decreased Interest 0 0  Down, Depressed, Hopeless 0 0  PHQ - 2 Score 0 0      Assessment & Plan   1. Essential hypertension, benign Well controlled, continue low salt diet recds  - amLODipine (NORVASC) 10 MG tablet; Take 1 tablet (10 mg total) by mouth daily.  Dispense: 90 tablet; Refill: 3 - lisinopril (PRINIVIL,ZESTRIL) 5 MG tablet; Take 1 tablet (5 mg total) by mouth every morning.  Dispense: 90 tablet; Refill: 3 - hydrochlorothiazide (HYDRODIURIL) 25 MG tablet; Take 1 tablet (25 mg total) by mouth daily.  Dispense: 90 tablet; Refill: 3  2. COPD exacerbation (HCC) Mild suspected, but still w/ cough since 9/17, will chk cxr, r/o chf w/ bnp labs. - short steroid course, renewed inhalers, recd symbicort 2 puffs bid for now. Albuterol prn. - DG Chest 2 View; Future - Brain natriuretic peptide - advised to stop smoking.  3. Gout of multiple sites, unspecified cause, unspecified chronicity controlled - allopurinol (ZYLOPRIM) 100 MG tablet; Take 1 tablet (100 mg total) by mouth daily.  Dispense: 90 tablet; Refill: 3  4. Prediabetes Controlled,  Renewed metformin 500 qd, and amaryl 1mg  qd - POCT glucose (manual entry) 127 - POCT glycosylated hemoglobin (Hb A1C) 6.2 - CBC with Differential - BASIC METABOLIC PANEL WITH GFR    5. hyperlipidemia - ascvd risk >20% in 10 years, recd taking the pravastatin 20mg  qhs and asa 81 qd,  for stroke and cad risk reduction.   - pt hx of stomach upset w/ aspirin, but no bleeding.  rcd takes it w/ food, her risk of  bleeding w/ asa 1.4% in 10 years, dw her that benefits of asa outweighs her risk of bleeding.  6. Epistaxis - suspect due to dry air, recd getting humidifier.  - saline nasal gtt rx. - if cannot afford humidifier, can also leave out a pot of water for slow evaporation, esp during winter when heater can dry air.  7. Colon cancer screening - encouraged her to get - Ambulatory referral to Gastroenterology  8. Right knee stiffness/arthritis suspected - right knee brace rx provided, may help provide more support/pain relief.  9. Tob abuse - total cessation recd - trial nicorrette gum and nicoderm patch if insurance covers.    Patient have been counseled extensively about nutrition and exercise  Return in about 3 months (around 09/30/2016), or if symptoms worsen or fail to improve.  The patient was given clear instructions to go to ER or return to medical center if symptoms don't improve, worsen or new problems develop. The patient verbalized understanding. The patient was told to call to get lab results if they haven't heard anything in the next week.   This note has been created with Surveyor, quantity. Any transcriptional errors are unintentional.   Maren Reamer, MD, [place] [facility] [place], [place]   07/02/2016, 10:54 AM

## 2016-07-03 LAB — BRAIN NATRIURETIC PEPTIDE: Brain Natriuretic Peptide: 6.6 pg/mL (ref <100)

## 2016-07-06 ENCOUNTER — Telehealth: Payer: Self-pay

## 2016-07-06 NOTE — Telephone Encounter (Signed)
Contacted pt to go over lab results pt is aware of results and doesn't have any questions or concerns 

## 2016-07-09 ENCOUNTER — Other Ambulatory Visit: Payer: Self-pay | Admitting: Internal Medicine

## 2016-07-09 DIAGNOSIS — I10 Essential (primary) hypertension: Secondary | ICD-10-CM

## 2016-07-09 DIAGNOSIS — M109 Gout, unspecified: Secondary | ICD-10-CM

## 2016-07-12 ENCOUNTER — Other Ambulatory Visit: Payer: Self-pay | Admitting: Internal Medicine

## 2016-07-27 ENCOUNTER — Other Ambulatory Visit: Payer: Self-pay | Admitting: Internal Medicine

## 2016-07-27 DIAGNOSIS — E119 Type 2 diabetes mellitus without complications: Secondary | ICD-10-CM

## 2016-08-20 ENCOUNTER — Other Ambulatory Visit: Payer: Self-pay | Admitting: Pharmacist

## 2016-08-20 ENCOUNTER — Other Ambulatory Visit: Payer: Self-pay | Admitting: Internal Medicine

## 2016-08-20 DIAGNOSIS — I10 Essential (primary) hypertension: Secondary | ICD-10-CM

## 2016-08-20 DIAGNOSIS — E119 Type 2 diabetes mellitus without complications: Secondary | ICD-10-CM

## 2016-08-20 MED ORDER — HYDROCHLOROTHIAZIDE 25 MG PO TABS: 25.0000 mg | ORAL_TABLET | Freq: Every day | ORAL | 3 refills | Status: DC

## 2016-09-18 ENCOUNTER — Other Ambulatory Visit: Payer: Self-pay | Admitting: Internal Medicine

## 2016-09-28 ENCOUNTER — Other Ambulatory Visit: Payer: Self-pay | Admitting: Internal Medicine

## 2016-09-28 DIAGNOSIS — I10 Essential (primary) hypertension: Secondary | ICD-10-CM

## 2016-09-28 DIAGNOSIS — M109 Gout, unspecified: Secondary | ICD-10-CM

## 2016-10-17 ENCOUNTER — Other Ambulatory Visit: Payer: Self-pay | Admitting: Internal Medicine

## 2016-10-19 ENCOUNTER — Other Ambulatory Visit: Payer: Self-pay | Admitting: Internal Medicine

## 2016-10-19 DIAGNOSIS — E119 Type 2 diabetes mellitus without complications: Secondary | ICD-10-CM

## 2016-10-22 ENCOUNTER — Other Ambulatory Visit: Payer: Self-pay | Admitting: *Deleted

## 2016-10-22 DIAGNOSIS — I10 Essential (primary) hypertension: Secondary | ICD-10-CM

## 2016-10-22 MED ORDER — HYDROCHLOROTHIAZIDE 25 MG PO TABS: 25.0000 mg | ORAL_TABLET | Freq: Every day | ORAL | 0 refills | Status: DC

## 2016-11-01 ENCOUNTER — Encounter: Payer: Self-pay | Admitting: Internal Medicine

## 2016-11-02 ENCOUNTER — Encounter: Payer: Self-pay | Admitting: Internal Medicine

## 2016-11-05 ENCOUNTER — Encounter: Payer: Self-pay | Admitting: Internal Medicine

## 2016-11-19 IMAGING — CR DG HAND COMPLETE 3+V*R*
3 series · 3 of 3 positions shown · non-contrast
Comparison: Wrist radiographs dated 10/22/2013

CLINICAL DATA: Pain and swelling of the right hand. No known
injury.

EXAM:
RIGHT HAND - COMPLETE 3+ VIEW

[x hand pa right]
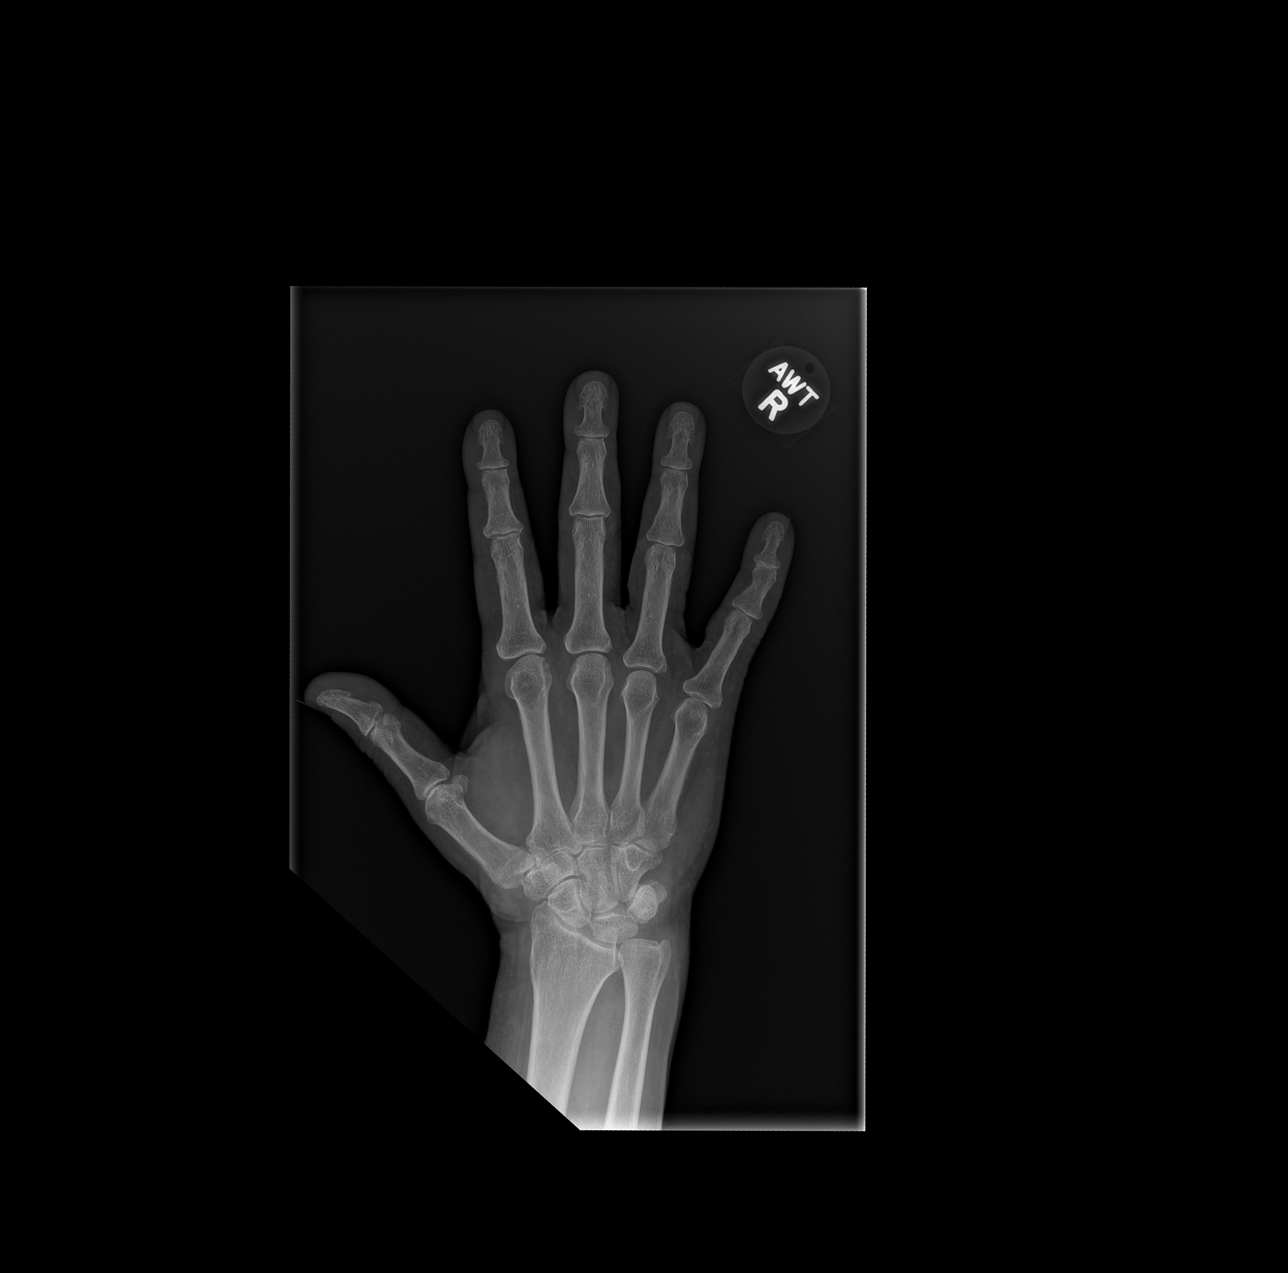

[x hand obl right]
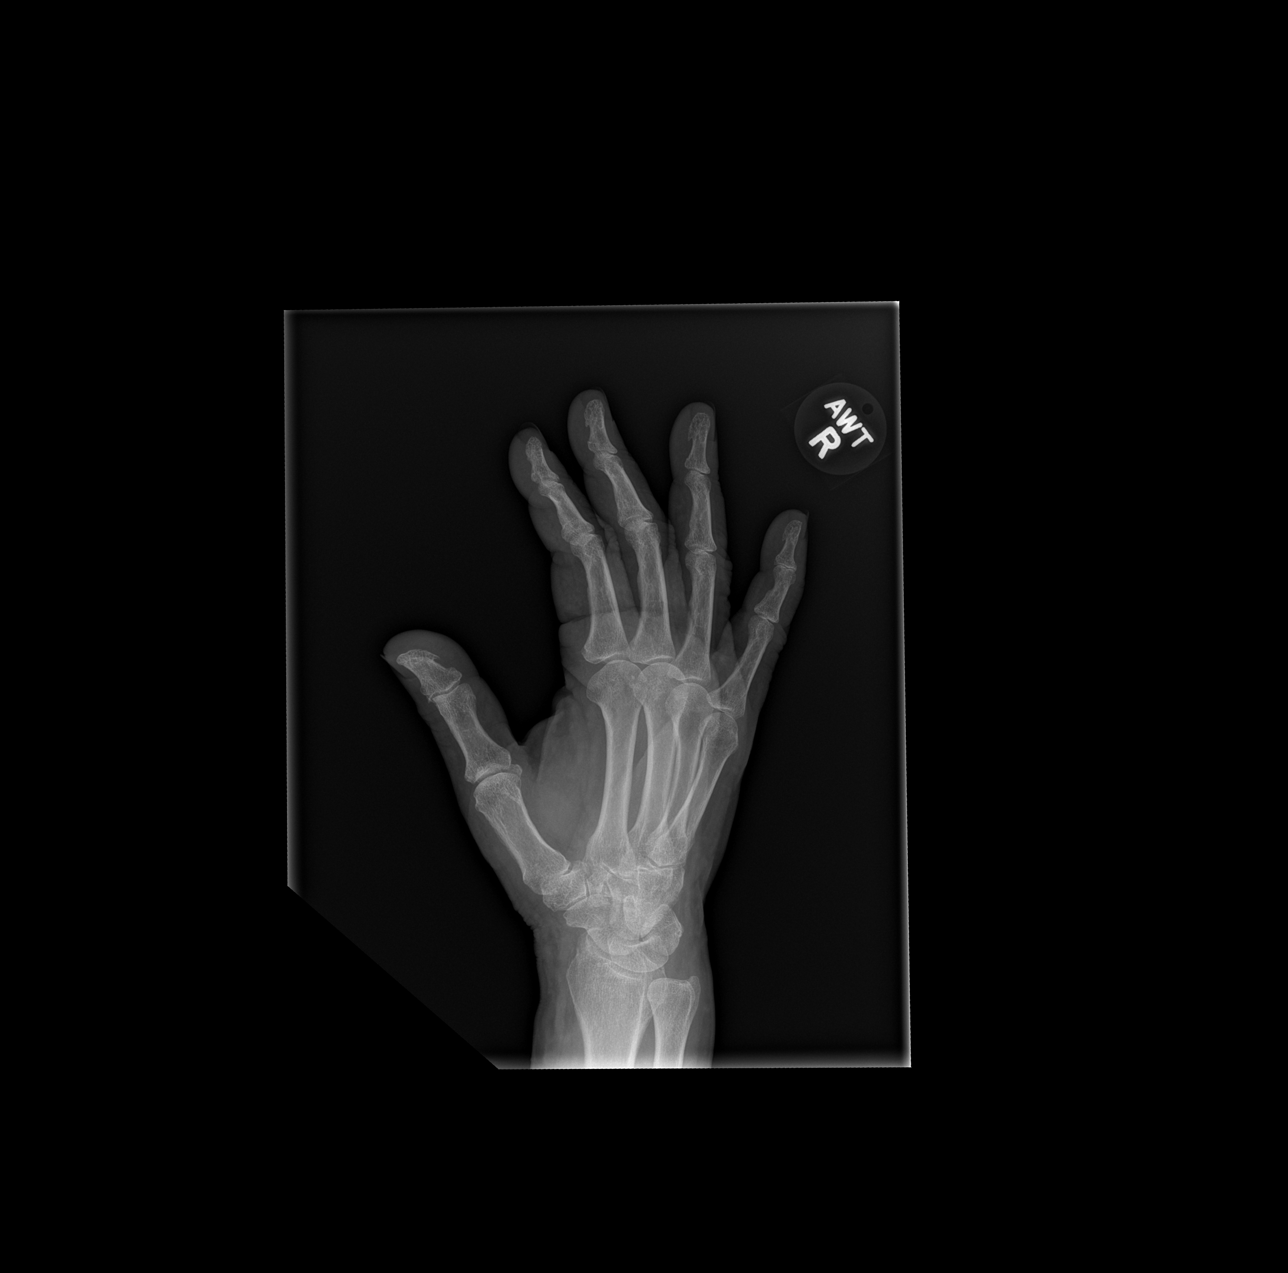

[x hand lat right]
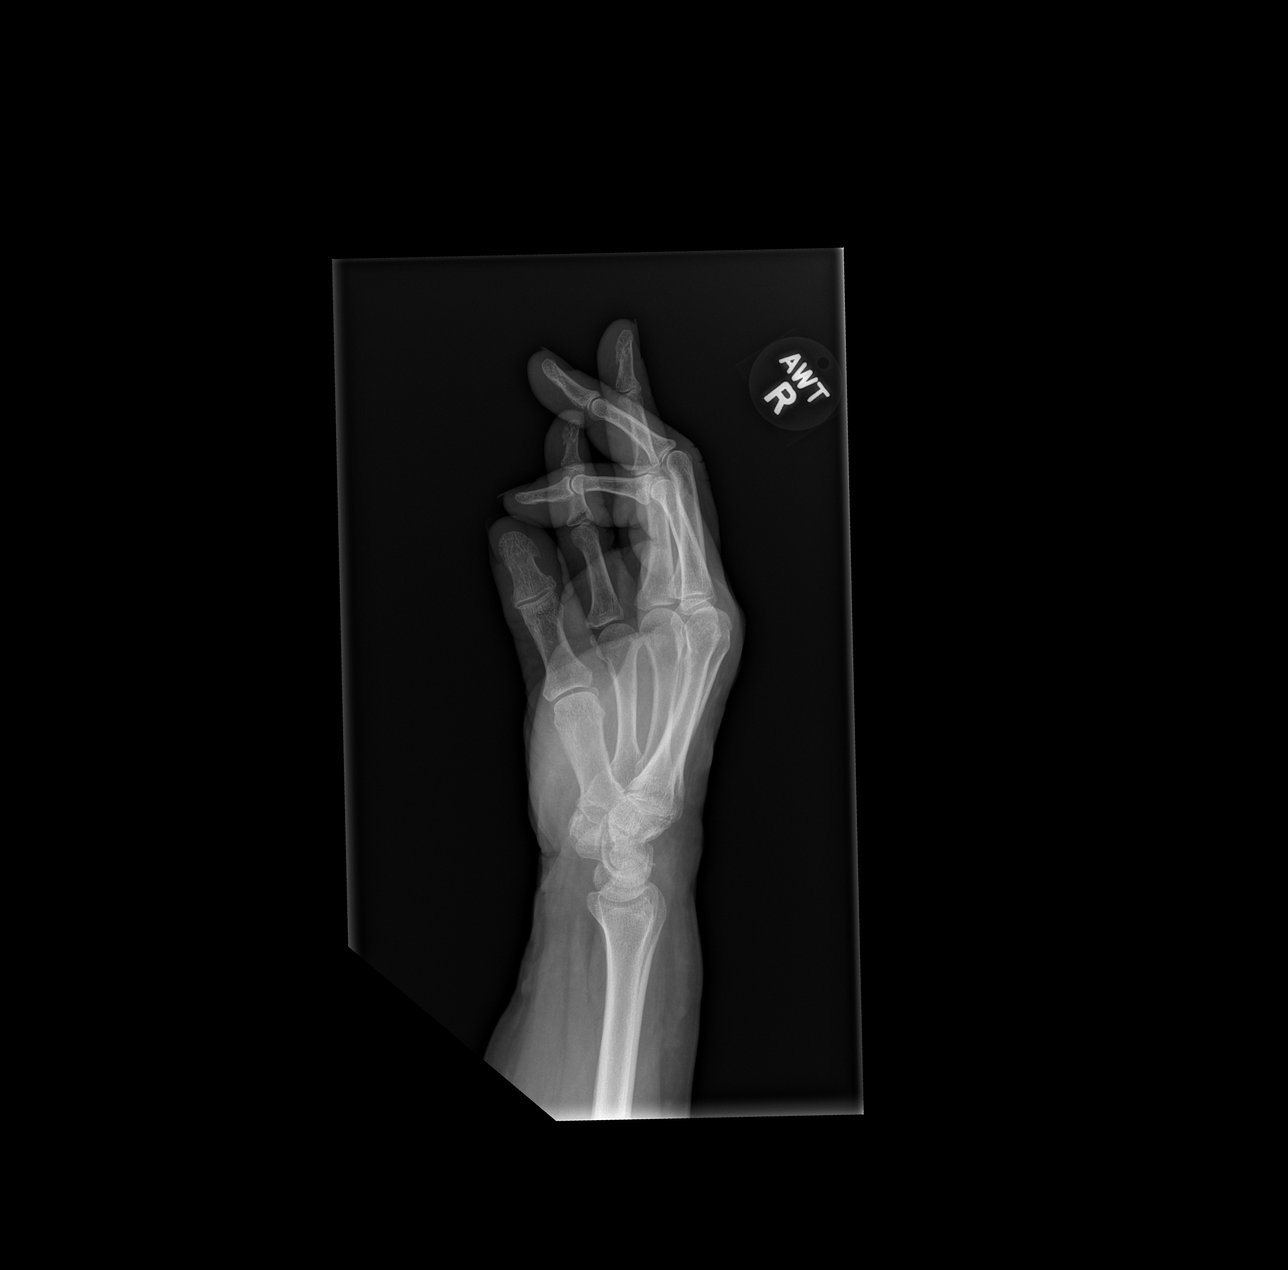

[3 of 3 positions shown; findings below may reference images not displayed]

FINDINGS: There is no fracture or dislocation. There is slight osteoarthritic
changes of interphalangeal joints and of the metacarpal phalangeal
joints.

There is a tiny old avulsion from the dorsal aspect of the
triquetrum.
IMPRESSION: No acute abnormality.  Slight arthritic changes.

## 2016-12-17 ENCOUNTER — Other Ambulatory Visit: Payer: Self-pay | Admitting: Pharmacist

## 2016-12-17 MED ORDER — RANITIDINE HCL 150 MG PO TABS: 150.0000 mg | ORAL_TABLET | Freq: Two times a day (BID) | ORAL | 0 refills | Status: DC

## 2016-12-21 ENCOUNTER — Other Ambulatory Visit: Payer: Self-pay | Admitting: Pharmacist

## 2016-12-21 DIAGNOSIS — M109 Gout, unspecified: Secondary | ICD-10-CM

## 2016-12-21 DIAGNOSIS — I10 Essential (primary) hypertension: Secondary | ICD-10-CM

## 2016-12-21 MED ORDER — HYDROCHLOROTHIAZIDE 25 MG PO TABS: 25.0000 mg | ORAL_TABLET | Freq: Every day | ORAL | 0 refills | Status: DC

## 2016-12-21 MED ORDER — ALLOPURINOL 100 MG PO TABS: 100.0000 mg | ORAL_TABLET | Freq: Every day | ORAL | 0 refills | Status: DC

## 2016-12-21 MED ORDER — AMLODIPINE BESYLATE 10 MG PO TABS: 10.0000 mg | ORAL_TABLET | Freq: Every day | ORAL | 0 refills | Status: DC

## 2016-12-21 NOTE — Telephone Encounter (Signed)
Received refill requests from [facility] for allopurinol, amlodipine, and HCTZ. Patient has appt with Dr. Wynetta Emery later this month. Will refill x 30 days. Consider changing HCTZ at next visit as patient has gout and HCTZ can increase risk of gout flares.

## 2017-01-10 ENCOUNTER — Ambulatory Visit: Payer: Medicare Other | Attending: Internal Medicine | Admitting: Internal Medicine

## 2017-01-10 ENCOUNTER — Encounter: Payer: Self-pay | Admitting: Internal Medicine

## 2017-01-10 VITALS — BP 133/74 | HR 85 | Temp 98.4°F | Resp 16

## 2017-01-10 DIAGNOSIS — Z90710 Acquired absence of both cervix and uterus: Secondary | ICD-10-CM | POA: Diagnosis not present

## 2017-01-10 DIAGNOSIS — M170 Bilateral primary osteoarthritis of knee: Secondary | ICD-10-CM | POA: Insufficient documentation

## 2017-01-10 DIAGNOSIS — J449 Chronic obstructive pulmonary disease, unspecified: Secondary | ICD-10-CM | POA: Diagnosis not present

## 2017-01-10 DIAGNOSIS — Z720 Tobacco use: Secondary | ICD-10-CM | POA: Diagnosis not present

## 2017-01-10 DIAGNOSIS — Z79899 Other long term (current) drug therapy: Secondary | ICD-10-CM | POA: Diagnosis not present

## 2017-01-10 DIAGNOSIS — K069 Disorder of gingiva and edentulous alveolar ridge, unspecified: Secondary | ICD-10-CM | POA: Diagnosis not present

## 2017-01-10 DIAGNOSIS — D1779 Benign lipomatous neoplasm of other sites: Secondary | ICD-10-CM | POA: Insufficient documentation

## 2017-01-10 DIAGNOSIS — Z7984 Long term (current) use of oral hypoglycemic drugs: Secondary | ICD-10-CM | POA: Insufficient documentation

## 2017-01-10 DIAGNOSIS — Z7982 Long term (current) use of aspirin: Secondary | ICD-10-CM | POA: Diagnosis not present

## 2017-01-10 DIAGNOSIS — F17210 Nicotine dependence, cigarettes, uncomplicated: Secondary | ICD-10-CM | POA: Insufficient documentation

## 2017-01-10 DIAGNOSIS — Z9889 Other specified postprocedural states: Secondary | ICD-10-CM | POA: Diagnosis not present

## 2017-01-10 DIAGNOSIS — E119 Type 2 diabetes mellitus without complications: Secondary | ICD-10-CM | POA: Insufficient documentation

## 2017-01-10 DIAGNOSIS — Z880 Allergy status to penicillin: Secondary | ICD-10-CM | POA: Diagnosis not present

## 2017-01-10 DIAGNOSIS — D179 Benign lipomatous neoplasm, unspecified: Secondary | ICD-10-CM | POA: Insufficient documentation

## 2017-01-10 DIAGNOSIS — J320 Chronic maxillary sinusitis: Secondary | ICD-10-CM | POA: Diagnosis not present

## 2017-01-10 LAB — POCT GLYCOSYLATED HEMOGLOBIN (HGB A1C): Hemoglobin A1C: 6.6

## 2017-01-10 LAB — GLUCOSE, POCT (MANUAL RESULT ENTRY): POC GLUCOSE: 105 mg/dL — AB (ref 70–99)

## 2017-01-10 MED ORDER — MELOXICAM 15 MG PO TABS: 15.0000 mg | ORAL_TABLET | Freq: Every day | ORAL | 4 refills | Status: DC

## 2017-01-10 MED ORDER — DOXYCYCLINE HYCLATE 100 MG PO TABS: 100.0000 mg | ORAL_TABLET | Freq: Two times a day (BID) | ORAL | 0 refills | Status: DC

## 2017-01-10 NOTE — Progress Notes (Signed)
Patient ID: Sarah [name], female    DOB: 1955-09-17  MRN: [id number]  CC: re-establish; Diabetes; Hypertension; and Dental Pain   Subjective: Sarah [name] is a 61 y.o. female who presents for f/u visit. Husband Sarah [name] is with her. Her concerns today include:  61 year old with history of diabetes, hypertension, tobacco dependence, osteoarthritis of multiple sites, COPD, gout, and hyperlipidemia. Husbanbd has dementia and she is his primary caregiver. They recently lost their house after evaluation by social services found snakes in the house. Currently living with their daughter. The whole situation has been stressful for her.  1. DM Due for eye exam.  -Reports compliance with medication. -Eating habits have been a bit off given that she is having to stay with her daughter Not getting in as much exercise as she would like because of pains in the knees  2. "My brain is leaking." -Complains of malodorous blood tinged mucus from nostrils  x several mths -pain around nostril and sharp pains over maxillary sinuses -no fever  3 COPD Out of breath with walking. Feels her breathing is worse despite use of inhalers. Positive morning cough -Using Symbicort when necessary. she was not aware that it was to be used twice a day. Uses the albuterol and Atrovent also as needed She has Symbicort, albuterol, and Atrovent inhalers with her today  4. Tob: Smoking1 pk a day. Would like to quit. Has already purchased nicotine patches 21 mg nicotine gum and lozenges. She has them with her today. Wanted to find out if it is okay for her to use them being that she is a diabetic  5. Knees -complains of intermittent swelling and stiffness in both knees. Worse when getting up from a seated position and with prolonged walking. Had arthroscopic surgery on the left knee 2 6. Request dental referral. She has a lot of plaque buildup with receding gumline that she is concerned about  Lipoma back x 15 yrs Patient  Active Problem List   Diagnosis Date Noted  . DM neuropathy, type II diabetes mellitus ([place]) 12/12/2015  . GERD (gastroesophageal reflux disease) 03/02/2015  . Ileus, postoperative ([place])   . Incarcerated ventral hernia 07/20/2014  . Abdominal pain, unspecified site 01/05/2013  . Pancreatitis, acute 01/05/2013  . Abdominal wall hernia 01/05/2013  . Diabetes type 2, controlled ([place])   . COPD with acute exacerbation ([place]) 06/28/2011  . Hemoptysis 06/28/2011  . HTN (hypertension) 06/27/2011  . TOBACCO ABUSE 02/05/2007  . CONSTIPATION NOS 02/05/2007  . OSTEOARTHROSIS, GENERALIZED, MULTIPLE SITES 02/05/2007  . LOW BACK PAIN 02/05/2007  . HX, URINARY INFECTION 02/05/2007     Current Outpatient Prescriptions on File Prior to Visit  Medication Sig Dispense Refill  . Acetaminophen-Aspirin Buffered (EXCEDRIN BACK & BODY PO) Take 1 tablet by mouth 5 (five) times daily as needed (pain).     [name] allopurinol (ZYLOPRIM) 100 MG tablet Take 1 tablet (100 mg total) by mouth daily. 30 tablet 0  . amLODipine (NORVASC) 10 MG tablet Take 1 tablet (10 mg total) by mouth daily. 30 tablet 0  . aspirin EC 81 MG tablet Take 1 tablet (81 mg total) by mouth daily. 90 tablet 3  . beclomethasone (QVAR) 40 MCG/ACT inhaler Inhale 2 puffs into the lungs daily as needed (for SOB).    . budesonide-formoterol (SYMBICORT) 160-4.5 MCG/ACT inhaler Inhale 2 puffs into the lungs 2 (two) times daily. 1 Inhaler 11  . buPROPion (WELLBUTRIN SR) 150 MG 12 hr tablet TAKE ONE TABLET BY MOUTH TWICE  DAILY (Patient not taking: Reported on 07/02/2016) 56 tablet 0  . citalopram (CELEXA) 20 MG tablet Take 1 tablet (20 mg total) by mouth daily. (Patient not taking: Reported on 07/02/2016) 30 tablet 3  . gabapentin (NEURONTIN) 300 MG capsule TAKE ONE CAPSULE BY MOUTH 3 TIMES A DAY (Patient not taking: Reported on 07/02/2016) 84 capsule 2  . glimepiride (AMARYL) 1 MG tablet Take 1 tablet (1 mg total) by mouth daily with breakfast. 90 tablet 3  .  glimepiride (AMARYL) 4 MG tablet take 1 tablet once daily WITH BREAKFAST 90 tablet 0  . hydrochlorothiazide (HYDRODIURIL) 25 MG tablet Take 1 tablet (25 mg total) by mouth daily. 30 tablet 0  . HYDROcodone-acetaminophen (NORCO/VICODIN) 5-325 MG tablet Take 1 tablet by mouth every 4 (four) hours as needed. (Patient not taking: Reported on 07/02/2016) 6 tablet 0  . HYDROcodone-acetaminophen (NORCO/VICODIN) 5-325 MG tablet Take 1 tablet by mouth every 6 (six) hours as needed. (Patient not taking: Reported on 07/02/2016) 4 tablet 0  . ipratropium (ATROVENT HFA) 17 MCG/ACT inhaler Inhale 2 puffs into the lungs every 6 (six) hours as needed for wheezing. (Patient not taking: Reported on 07/02/2016) 1 Inhaler 12  . lisinopril (PRINIVIL,ZESTRIL) 5 MG tablet Take 1 tablet (5 mg total) by mouth every morning. 90 tablet 3  . magnesium hydroxide (MILK OF MAGNESIA) 400 MG/5ML suspension Take 30 mLs by mouth daily as needed for mild constipation.    . meloxicam (MOBIC) 7.5 MG tablet take 1 tablet by mouth once daily 30 tablet 0  . metFORMIN (GLUCOPHAGE) 500 MG tablet Take 1 tablet (500 mg total) by mouth daily with breakfast. 90 tablet 3  . naproxen (NAPROSYN) 500 MG tablet Take 1 tablet (500 mg total) by mouth 2 (two) times daily. (Patient not taking: Reported on 07/02/2016) 30 tablet 0  . nicotine (NICODERM CQ) 21 mg/24hr patch Place 1 patch (21 mg total) onto the skin daily. 28 patch 0  . nicotine polacrilex (NICORETTE) 4 MG gum Take 1 each (4 mg total) by mouth as needed for smoking cessation. 100 tablet 0  . ofloxacin (OCUFLOX) 0.3 % ophthalmic solution Place 1 drop into both eyes 4 (four) times daily. (Patient not taking: Reported on 07/02/2016) 5 mL 0  . olopatadine (PATANOL) 0.1 % ophthalmic solution Place 1 drop into both eyes daily as needed for allergies (itchiness).     . ondansetron (ZOFRAN ODT) 4 MG disintegrating tablet Take 1 tablet (4 mg total) by mouth every 8 (eight) hours as needed for nausea or  vomiting. (Patient not taking: Reported on 07/02/2016) 15 tablet 0  . polyethylene glycol (MIRALAX / GLYCOLAX) packet Take 17 g by mouth daily as needed for mild constipation (Pt takes 3-4x/week).     . pravastatin (PRAVACHOL) 20 MG tablet Take 1 tablet (20 mg total) by mouth daily. 90 tablet 3  . predniSONE (DELTASONE) 20 MG tablet Take 3 tablets (60 mg total) by mouth daily with breakfast. Day 1-3, take 60mg  (=3 tabs) qam, day 4-7 take 40mg  qam (=2 tabs), day 8-10 take 1 tab daily, than stop 20 tablet 0  . PROAIR HFA 108 (90 Base) MCG/ACT inhaler INHALE 2 PUFFS BY MOUTH EVERY 4 HOURS AS NEEDED FOR WHEEZING OR SHORTNESS OF BREATH 8.5 g 1  . ranitidine (ZANTAC) 150 MG tablet Take 1 tablet (150 mg total) by mouth 2 (two) times daily. 60 tablet 0  . sodium chloride (OCEAN) 0.65 % SOLN nasal spray Place 2 sprays into both nostrils as needed for  congestion. 1 Bottle 11   No current facility-administered medications on file prior to visit.     Allergies  Allergen Reactions  . Aspirin Other (See Comments)    Causes her stomach pain.  . Ibuprofen Other (See Comments)    Causes stomach pain  . Penicillins Other (See Comments)    Localized whelps and redness after injection Has patient had a PCN reaction causing immediate rash, facial/tongue/throat swelling, SOB or lightheadedness with hypotension: No Has patient had a PCN reaction causing severe rash involving mucus membranes or skin necrosis: Yes Has patient had a PCN reaction that required hospitalization No Has patient had a PCN reaction occurring within the last 10 years: No If all of the above answers are "NO", then may proceed with Cephalosporin use.     Social History   Social History  . Marital status: Married    Spouse name: N/A  . Number of children: N/A  . Years of education: N/A   Occupational History  . Not on file.   Social History Main Topics  . Smoking status: Current Every Day Smoker    Packs/day: 1.50    Years: 40.00     Types: Cigarettes  . Smokeless tobacco: Never Used  . Alcohol use No     Comment: hx alcohol abuse- drank >fifth of gin daily/  states Quit 2002  . Drug use: No  . Sexual activity: Not on file   Other Topics Concern  . Not on file   Social History Narrative  . No narrative on file    No family history on file.  Past Surgical History:  Procedure Laterality Date  . DILATION AND CURETTAGE OF UTERUS    . INSERTION OF MESH N/A 07/20/2014   Procedure: INSERTION OF MESH;  Surgeon: Fanny Skates, MD;  Location: [facility];  Service: General;  Laterality: N/A;  . KNEE ARTHROSCOPY Left 2000  . TOTAL ABDOMINAL HYSTERECTOMY W/ BILATERAL SALPINGOOPHORECTOMY  1995  . TRANSTHORACIC ECHOCARDIOGRAM  07-22-2014   mild LVH,  60-65%,  grade I diastolic dysfunction/  mild LAE  . VENTRAL HERNIA REPAIR N/A 07/20/2014   Procedure: LAPAROSCOPIC REPAIR INCARCARATED VENTRAL HERNIA AND UMBILICAL HERNIA;  Surgeon: Fanny Skates, MD;  Location: [facility];  Service: General;  Laterality: N/A;    ROS: Review of Systems Negative except as stated above PHYSICAL EXAM: BP 133/74   Pulse 85   Temp 98.4 F (36.9 C) (Oral)   Resp 16   SpO2 95%   Physical Exam  General appearance - alert, well appearing, obese older African-American female and in no distress Mental status - alert, oriented to person, place, and time, normal mood, behavior, speech, dress, motor activity, and thought processes Nose - normal and patent, no erythema, discharge or polyps Mouth - mucous membranes moist, pharynx normal without lesions. Significant plaque buildup with receding gumline Neck - supple, no significant adenopathy Chest - clear to auscultation, no wheezes, rales or rhonchi, symmetric air entry Heart - normal rate, regular rhythm, normal S1, S2, no murmurs, rubs, clicks or gallops Musculoskeletal - knees: Joints are enlarged. No point tenderness. Moderate crepitus on passive movement. Extremities -no lower extremity  edema. Skin: Large lipoma below left scapular. Patient reports having this for 15 years and it has not changed over the past several years Results for orders placed or performed in visit on 01/10/17  POCT glucose (manual entry)  Result Value Ref Range   POC Glucose 105 (A) 70 - 99 mg/dl  POCT glycosylated hemoglobin (Hb  A1C)  Result Value Ref Range   Hemoglobin A1C 6.6    Depression screen [facility] 2/9 07/02/2016 09/05/2015  Decreased Interest 0 0  Down, Depressed, Hopeless 0 0  PHQ - 2 Score 0 0    ASSESSMENT AND PLAN: 1. Controlled type 2 diabetes mellitus without complication, without long-term current use of insulin ([place]) Discussed the importance of healthy eating habits, regular aerobic exercise (at least 150 minutes a week as tolerated) and medication compliance to achieve or maintain control of diabetes. -Continue Amaryl. - POCT glucose (manual entry) - POCT glycosylated hemoglobin (Hb A1C) - Ambulatory referral to Ophthalmology  2. Tobacco abuse Patient advised to quit smoking. Discussed health risks associated with smoking including lung and other types of cancers, chronic lung diseases and CV risks.. Pt ready to give trail of quitting.  Discussed methods to help quit including quitting cold [place], use of NRT, Chantix and Bupropion. She will start using the nicotine patches and gum. I have instructed her in use  3. Chronic obstructive pulmonary disease, unspecified COPD type ([place]) Advised to use Symbicort twice a day as prescribed and the albuterol and Atrovent as needed Strongly encouraged to quit smoking  -4. Chronic maxillary sinusitis -Willl give a trial of antibiotics. If no improvement will refer to ENT - doxycycline (VIBRA-TABS) 100 MG tablet; Take 1 tablet (100 mg total) by mouth 2 (two) times daily.  Dispense: 20 tablet; Refill: 0  5. Lipoma of other specified sites -Observe for now since this has not changed over the past several years  6. Osteoarthritis of both  knees, unspecified osteoarthritis type Weight loss encouraged. Meloxicam daily - DG Knee Complete 4 Views Left; Future - DG Knee Complete 4 Views Right; Future - meloxicam (MOBIC) 15 MG tablet; Take 1 tablet (15 mg total) by mouth daily.  Dispense: 30 tablet; Refill: 4  7. Gum disease* - Ambulatory referral to Dentistry  Patient declined Pneumovax and TD. Patient was given the opportunity to ask questions.  Patient verbalized understanding of the plan and was able to repeat key elements of the plan.   Orders Placed This Encounter  Procedures  . POCT glucose (manual entry)  . POCT glycosylated hemoglobin (Hb A1C)     Requested Prescriptions    No prescriptions requested or ordered in this encounter    No Follow-up on file.  Karle Plumber, MD, FACP

## 2017-01-10 NOTE — Patient Instructions (Signed)
Take the antibiotic as prescribed.   Use Symbicort twice a day as prescribed.

## 2017-01-14 ENCOUNTER — Encounter: Payer: Self-pay | Admitting: Internal Medicine

## 2017-01-14 ENCOUNTER — Telehealth: Payer: Self-pay | Admitting: Internal Medicine

## 2017-01-14 DIAGNOSIS — I10 Essential (primary) hypertension: Secondary | ICD-10-CM

## 2017-01-14 DIAGNOSIS — M109 Gout, unspecified: Secondary | ICD-10-CM

## 2017-01-14 MED ORDER — AMLODIPINE BESYLATE 10 MG PO TABS: 10.0000 mg | ORAL_TABLET | Freq: Every day | ORAL | 0 refills | Status: DC

## 2017-01-14 MED ORDER — METFORMIN HCL 500 MG PO TABS: 500.0000 mg | ORAL_TABLET | Freq: Every day | ORAL | 0 refills | Status: DC

## 2017-01-14 MED ORDER — BUDESONIDE-FORMOTEROL FUMARATE 160-4.5 MCG/ACT IN AERO
2.0000 | INHALATION_SPRAY | Freq: Two times a day (BID) | RESPIRATORY_TRACT | 2 refills | Status: DC
Start: 2017-01-14 — End: 2020-11-22

## 2017-01-14 MED ORDER — ALLOPURINOL 100 MG PO TABS: 100.0000 mg | ORAL_TABLET | Freq: Every day | ORAL | 0 refills | Status: DC

## 2017-01-14 MED ORDER — HYDROCHLOROTHIAZIDE 25 MG PO TABS: 25.0000 mg | ORAL_TABLET | Freq: Every day | ORAL | 0 refills | Status: DC

## 2017-01-14 MED ORDER — GLIMEPIRIDE 1 MG PO TABS: 1.0000 mg | ORAL_TABLET | Freq: Every day | ORAL | 0 refills | Status: DC

## 2017-01-14 MED ORDER — ALBUTEROL SULFATE HFA 108 (90 BASE) MCG/ACT IN AERS: INHALATION_SPRAY | RESPIRATORY_TRACT | 1 refills | Status: DC

## 2017-01-14 MED ORDER — LISINOPRIL 5 MG PO TABS: 5.0000 mg | ORAL_TABLET | Freq: Every morning | ORAL | 0 refills | Status: DC

## 2017-01-14 MED ORDER — PRAVASTATIN SODIUM 20 MG PO TABS: 20.0000 mg | ORAL_TABLET | Freq: Every day | ORAL | 0 refills | Status: DC

## 2017-01-14 MED ORDER — RANITIDINE HCL 150 MG PO TABS: 150.0000 mg | ORAL_TABLET | Freq: Two times a day (BID) | ORAL | 2 refills | Status: DC

## 2017-01-14 NOTE — Telephone Encounter (Signed)
Pt called requesting medication refills on all current medications. Pt states she forgot to mention during ov..the patient would like medications sent to [facility] on [address]

## 2017-01-14 NOTE — Telephone Encounter (Signed)
All chronic medications refilled and sent to requested pharmacy

## 2017-01-15 IMAGING — CR DG CHEST 2V
2 series · 2 of 2 positions shown · non-contrast
Comparison: 07/22/2014.

CLINICAL DATA: Upper abdominal pain, nausea and constipation for
the past 5 days. Chest pain radiating into the left shoulder and arm
for the past 2 days.

EXAM:
CHEST  2 VIEW

[w chest pa]
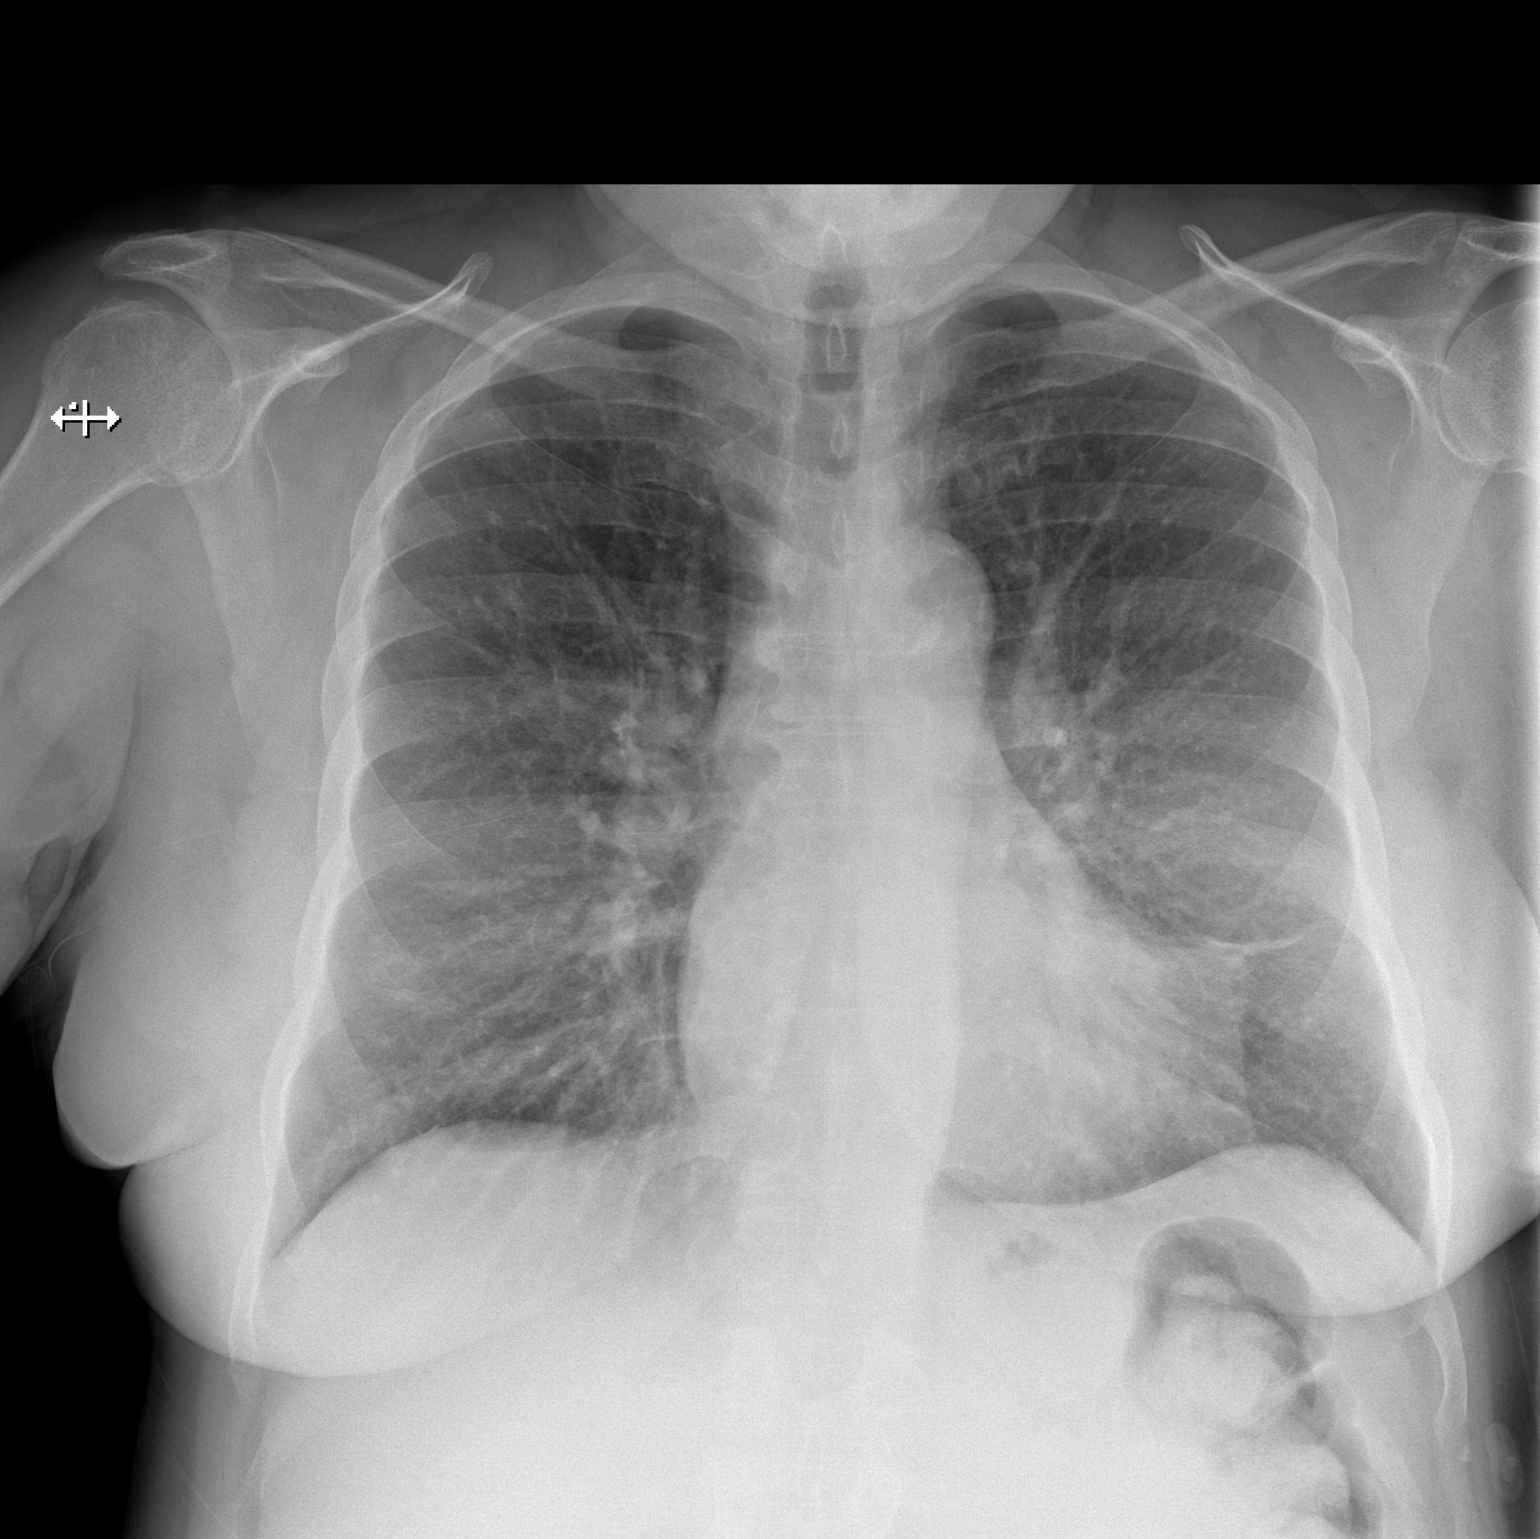

[w chest lat]
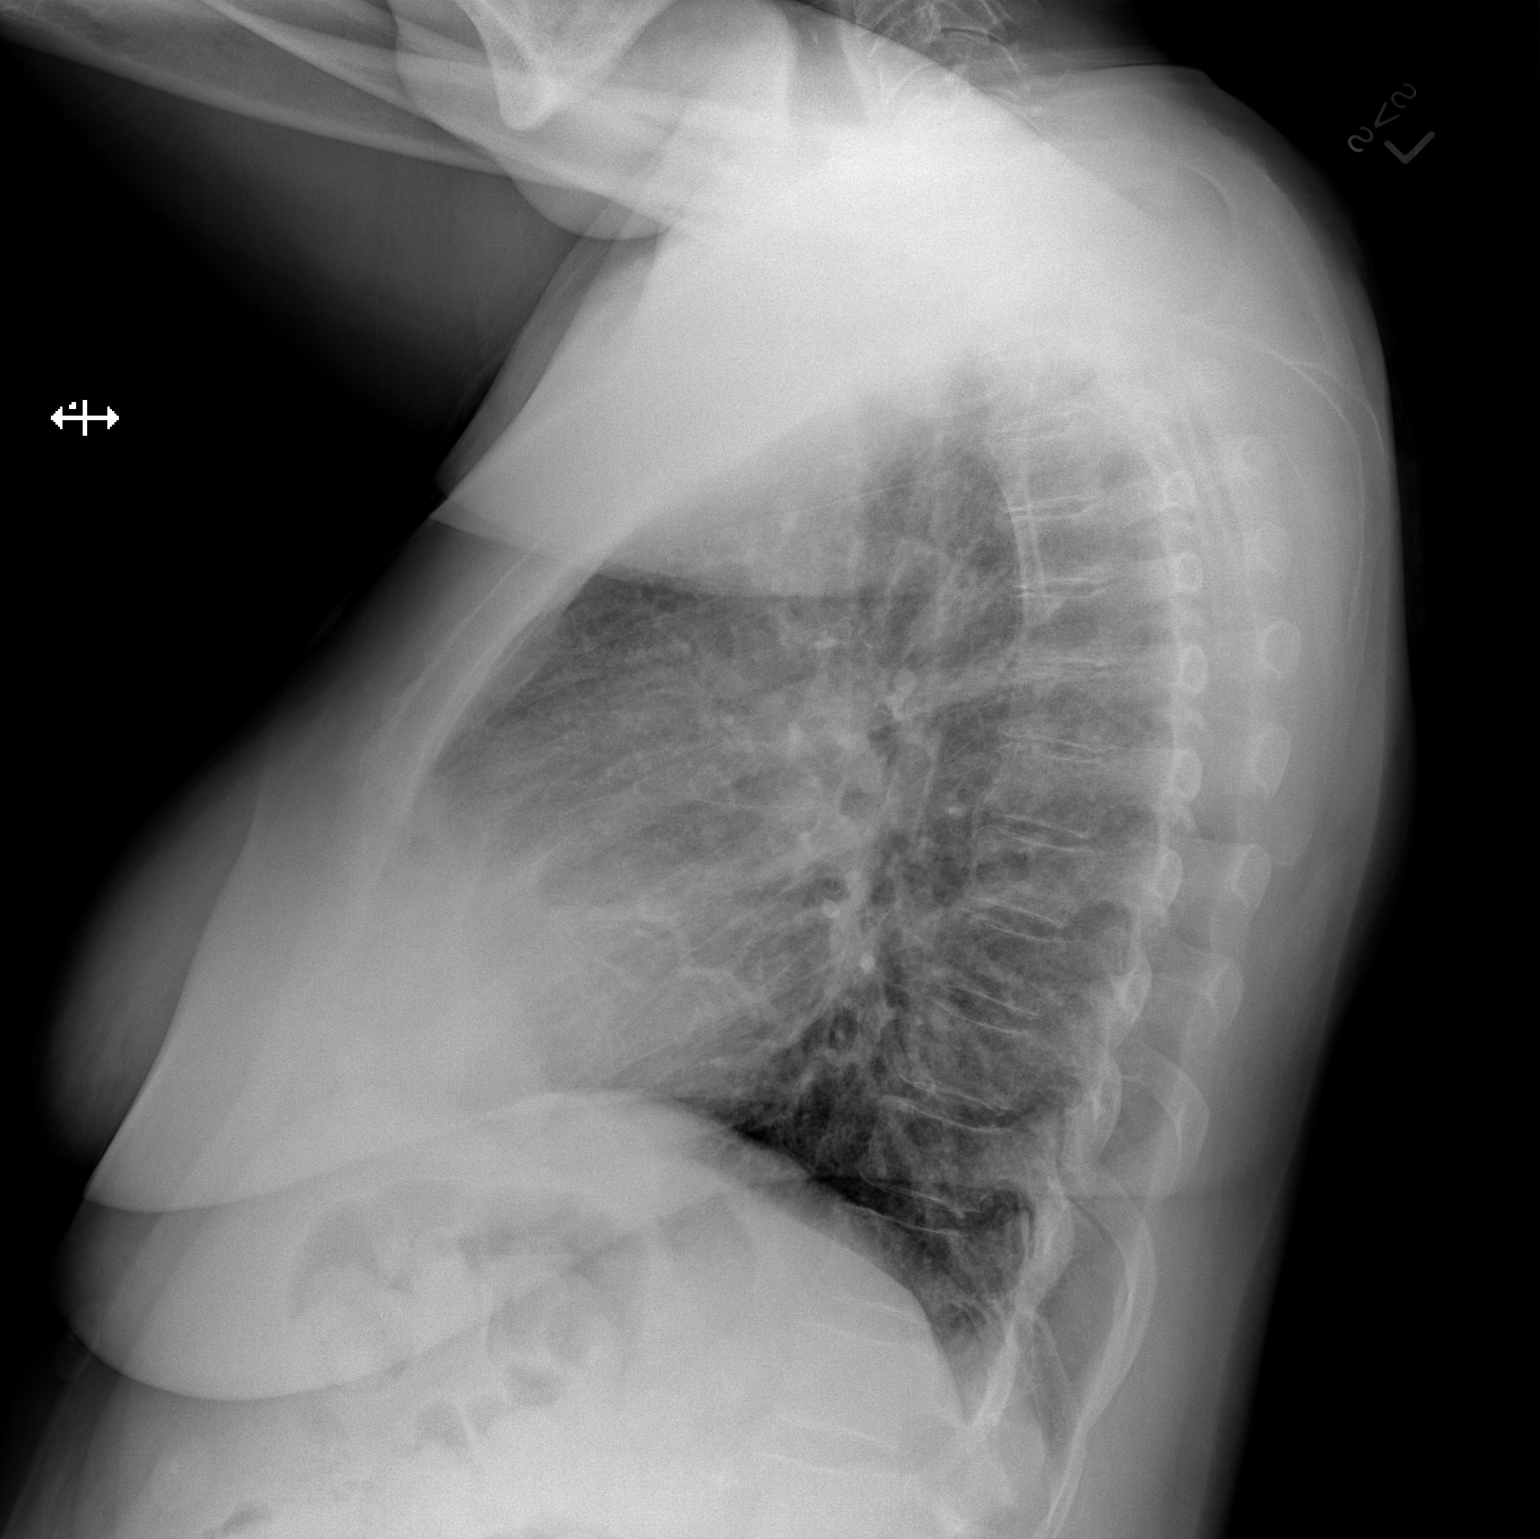

[2 of 2 positions shown; findings below may reference images not displayed]

FINDINGS: Normal sized heart. Small amount of linear density in the lingula.
Otherwise, clear lungs. The interstitial markings remain mildly
prominent with mild diffuse peribronchial thickening. Mild thoracic
spine degenerative changes.
IMPRESSION: No acute abnormality. Mild chronic bronchitic changes and small
amount of lingular scarring.

## 2017-01-22 ENCOUNTER — Other Ambulatory Visit: Payer: Self-pay | Admitting: Internal Medicine

## 2017-02-19 ENCOUNTER — Other Ambulatory Visit: Payer: Self-pay | Admitting: Pharmacist

## 2017-02-19 DIAGNOSIS — M170 Bilateral primary osteoarthritis of knee: Secondary | ICD-10-CM

## 2017-02-19 DIAGNOSIS — I10 Essential (primary) hypertension: Secondary | ICD-10-CM

## 2017-02-19 MED ORDER — HYDROCHLOROTHIAZIDE 25 MG PO TABS: 25.0000 mg | ORAL_TABLET | Freq: Every day | ORAL | 0 refills | Status: DC

## 2017-02-19 MED ORDER — MELOXICAM 15 MG PO TABS: 15.0000 mg | ORAL_TABLET | Freq: Every day | ORAL | 0 refills | Status: DC

## 2017-02-19 MED ORDER — GLIMEPIRIDE 1 MG PO TABS: 1.0000 mg | ORAL_TABLET | Freq: Every day | ORAL | 0 refills | Status: DC

## 2017-02-22 ENCOUNTER — Other Ambulatory Visit: Payer: Self-pay | Admitting: Internal Medicine

## 2017-02-22 DIAGNOSIS — M109 Gout, unspecified: Secondary | ICD-10-CM

## 2017-02-22 DIAGNOSIS — I10 Essential (primary) hypertension: Secondary | ICD-10-CM

## 2017-03-14 ENCOUNTER — Ambulatory Visit: Payer: Medicare Other | Admitting: Licensed Clinical Social Worker

## 2017-03-14 ENCOUNTER — Encounter: Payer: Self-pay | Admitting: Internal Medicine

## 2017-03-14 ENCOUNTER — Ambulatory Visit: Payer: Medicare Other | Attending: Internal Medicine | Admitting: Internal Medicine

## 2017-03-14 VITALS — BP 127/71 | HR 76 | Temp 98.1°F | Resp 16 | Wt 226.0 lb

## 2017-03-14 DIAGNOSIS — Z880 Allergy status to penicillin: Secondary | ICD-10-CM | POA: Insufficient documentation

## 2017-03-14 DIAGNOSIS — M25512 Pain in left shoulder: Secondary | ICD-10-CM | POA: Insufficient documentation

## 2017-03-14 DIAGNOSIS — K069 Disorder of gingiva and edentulous alveolar ridge, unspecified: Secondary | ICD-10-CM | POA: Diagnosis not present

## 2017-03-14 DIAGNOSIS — E785 Hyperlipidemia, unspecified: Secondary | ICD-10-CM | POA: Diagnosis not present

## 2017-03-14 DIAGNOSIS — Z7951 Long term (current) use of inhaled steroids: Secondary | ICD-10-CM | POA: Diagnosis not present

## 2017-03-14 DIAGNOSIS — Z7982 Long term (current) use of aspirin: Secondary | ICD-10-CM | POA: Diagnosis not present

## 2017-03-14 DIAGNOSIS — Z720 Tobacco use: Secondary | ICD-10-CM

## 2017-03-14 DIAGNOSIS — Z79899 Other long term (current) drug therapy: Secondary | ICD-10-CM | POA: Insufficient documentation

## 2017-03-14 DIAGNOSIS — M159 Polyosteoarthritis, unspecified: Secondary | ICD-10-CM | POA: Insufficient documentation

## 2017-03-14 DIAGNOSIS — G8929 Other chronic pain: Secondary | ICD-10-CM | POA: Insufficient documentation

## 2017-03-14 DIAGNOSIS — E119 Type 2 diabetes mellitus without complications: Secondary | ICD-10-CM

## 2017-03-14 DIAGNOSIS — J320 Chronic maxillary sinusitis: Secondary | ICD-10-CM | POA: Diagnosis not present

## 2017-03-14 DIAGNOSIS — Z886 Allergy status to analgesic agent status: Secondary | ICD-10-CM | POA: Diagnosis not present

## 2017-03-14 DIAGNOSIS — J449 Chronic obstructive pulmonary disease, unspecified: Secondary | ICD-10-CM | POA: Diagnosis not present

## 2017-03-14 DIAGNOSIS — I10 Essential (primary) hypertension: Secondary | ICD-10-CM | POA: Diagnosis not present

## 2017-03-14 DIAGNOSIS — E1140 Type 2 diabetes mellitus with diabetic neuropathy, unspecified: Secondary | ICD-10-CM | POA: Diagnosis not present

## 2017-03-14 DIAGNOSIS — Z5900 Homelessness unspecified: Secondary | ICD-10-CM

## 2017-03-14 DIAGNOSIS — M545 Low back pain: Secondary | ICD-10-CM | POA: Diagnosis not present

## 2017-03-14 DIAGNOSIS — Z7984 Long term (current) use of oral hypoglycemic drugs: Secondary | ICD-10-CM | POA: Insufficient documentation

## 2017-03-14 DIAGNOSIS — M109 Gout, unspecified: Secondary | ICD-10-CM | POA: Diagnosis not present

## 2017-03-14 DIAGNOSIS — F17210 Nicotine dependence, cigarettes, uncomplicated: Secondary | ICD-10-CM | POA: Diagnosis not present

## 2017-03-14 DIAGNOSIS — K219 Gastro-esophageal reflux disease without esophagitis: Secondary | ICD-10-CM | POA: Diagnosis not present

## 2017-03-14 DIAGNOSIS — F439 Reaction to severe stress, unspecified: Secondary | ICD-10-CM

## 2017-03-14 LAB — GLUCOSE, POCT (MANUAL RESULT ENTRY): POC GLUCOSE: 76 mg/dL (ref 70–99)

## 2017-03-14 MED ORDER — DOXYCYCLINE HYCLATE 100 MG PO TABS: 100.0000 mg | ORAL_TABLET | Freq: Two times a day (BID) | ORAL | 0 refills | Status: DC

## 2017-03-14 MED ORDER — BLOOD GLUCOSE MONITOR KIT: PACK | 0 refills | Status: AC

## 2017-03-14 NOTE — BH Specialist Note (Signed)
Integrated Behavioral Health Initial Visit  MRN: [id number] Name: Sarah [name]  Number of [place] Clinician visits:: 1/6 Session Start time: 11:40 AM  Session End time: 12:10 PM Total time: 30 minutes  Type of Service: [place] Interpretor:No. Interpretor Name and Language: N/A   Warm Hand Off Completed.       SUBJECTIVE: Sarah [name] is a 61 y.o. female accompanied by Spouse Patient was referred by Dr. Wynetta Emery for stress. Patient reports the following symptoms/concerns: feelings of sadness and worry due to loss of housing and spouse's ongoing medical concerns Duration of problem: Couple of months; Severity of problem: mild  OBJECTIVE: Mood: Pleasant and Affect: Appropriate Risk of harm to self or others: No plan to harm self or others  LIFE CONTEXT: Family and Social: Pt receives financial and emotional support from her adult daughters.  School/Work: Pt has monthly income of [phone])  Self-Care: Pt denies substance use. Talks with family as a form of stress relief Life Changes: Pt and spouse lost housing and is currently residing with adult daughter and grandchildren. Pt would like to obtain independent housing for self and spouse  GOALS ADDRESSED: Patient will: 1. Reduce symptoms of: anxiety 2. Increase knowledge and/or ability of: coping skills  3. Demonstrate ability to: Increase healthy adjustment to current life circumstances and Increase adequate support systems for patient/family  INTERVENTIONS: Interventions utilized: Solution-Focused Strategies  Standardized Assessments completed: GAD-7 and PHQ 2&9  ASSESSMENT: Patient currently experiencing mild anxiety due to homelessness. She reports feelings of sadness and worry on occasion. Pt was accompanied at visit by her spouse and minor granddaughter. She receives strong family support.   Patient may benefit from psychotherapy. [place] educated pt on how  stress can negatively impact one's mental and physical health. Pt successfully identified healthy coping skills to utilize to decrease symptoms. She is knowledgeable of local housing resources and is not interested in behavioral health services at this time.  PLAN: 1. Follow up with behavioral health clinician on : Pt was encouraged to contact [facility] if symptoms worsen or fail to improve to schedule behavioral appointments at [facility]. 2. Behavioral recommendations: [facility] recommends that pt apply healthy coping skills discussed and utilize provided resources. Pt is encouraged to schedule follow up appointment with [facility] 3. Referral(s): Community Resources:  Haematologist and Housing 4. "From scale of 1-10, how likely are you to follow plan?": 8/10  Rebekah Chesterfield, LCSW 03/18/17 5:18 PM

## 2017-03-14 NOTE — Progress Notes (Signed)
Patient ID: Sarah [name], female    DOB: [birth year]  MRN: [id number]  CC: Follow-up (DM) and Edema (hands)   Subjective: Sarah [name] is a 61 y.o. female who presents for chronic ds management. Husband is with her.  Last seen 2 mths ago. Her concerns today include:  61 year old with history of diabetes, hypertension, tobacco dependence, osteoarthritis of multiple sites, COPD, gout, and hyperlipidemia.  1. Tob dep: did not use the NRT; has too much going on right now. Still having to live with her daughter  2. DM: -compliant with Amaryl -does not check BS. Does not like sticking herself -eating less because groceries run out in middle of month -had eye exam about 1 mth by Dr. Schuyler Amor. Has new rxn glasses  3. Having a flare with sinusitis again and requesting RF on Doxycycline + discolored drainage, + pain/pressure over maxillary sinuses  4. Needs dental referral resubmitted for gum ds.  5. C/o pain LT shoulder x 5 mths. No known injury -hurts with lifting above head and rotation behind back. Worse at nights. Uses a Lidocaine pain rub which helps -referred for knee x-rays on last visit  Patient Active Problem List   Diagnosis Date Noted  . Lipoma 01/10/2017  . Gum disease 01/10/2017  . DM neuropathy, type II diabetes mellitus ([place]) 12/12/2015  . GERD (gastroesophageal reflux disease) 03/02/2015  . Abdominal wall hernia 01/05/2013  . Diabetes type 2, controlled ([place])   . Hemoptysis 06/28/2011  . HTN (hypertension) 06/27/2011  . TOBACCO ABUSE 02/05/2007  . CONSTIPATION NOS 02/05/2007  . OSTEOARTHROSIS, GENERALIZED, MULTIPLE SITES 02/05/2007  . LOW BACK PAIN 02/05/2007     Current Outpatient Prescriptions on File Prior to Visit  Medication Sig Dispense Refill  . Acetaminophen-Aspirin Buffered (EXCEDRIN BACK & BODY PO) Take 1 tablet by mouth 5 (five) times daily as needed (pain).     [name] albuterol (PROAIR HFA) 108 (90 Base) MCG/ACT inhaler INHALE 2 PUFFS BY MOUTH EVERY 4  HOURS AS NEEDED FOR WHEEZING OR SHORTNESS OF BREATH 8.5 g 1  . allopurinol (ZYLOPRIM) 100 MG tablet Take 1 tablet (100 mg total) by mouth daily. 90 tablet 0  . amLODipine (NORVASC) 10 MG tablet Take 1 tablet (10 mg total) by mouth daily. 90 tablet 0  . aspirin EC 81 MG tablet Take 1 tablet (81 mg total) by mouth daily. 90 tablet 3  . budesonide-formoterol (SYMBICORT) 160-4.5 MCG/ACT inhaler Inhale 2 puffs into the lungs 2 (two) times daily. 1 Inhaler 2  . glimepiride (AMARYL) 1 MG tablet Take 1 tablet (1 mg total) by mouth daily with breakfast. 90 tablet 0  . hydrochlorothiazide (HYDRODIURIL) 25 MG tablet Take 1 tablet (25 mg total) by mouth daily. 90 tablet 0  . ipratropium (ATROVENT HFA) 17 MCG/ACT inhaler Inhale 2 puffs into the lungs every 6 (six) hours as needed for wheezing. (Patient not taking: Reported on 07/02/2016) 1 Inhaler 12  . lisinopril (PRINIVIL,ZESTRIL) 5 MG tablet Take 1 tablet (5 mg total) by mouth every morning. 90 tablet 0  . magnesium hydroxide (MILK OF MAGNESIA) 400 MG/5ML suspension Take 30 mLs by mouth daily as needed for mild constipation.    . meloxicam (MOBIC) 15 MG tablet Take 1 tablet (15 mg total) by mouth daily. 30 tablet 0  . metFORMIN (GLUCOPHAGE) 500 MG tablet Take 1 tablet (500 mg total) by mouth daily with breakfast. 90 tablet 0  . nicotine (NICODERM CQ) 21 mg/24hr patch Place 1 patch (21 mg total) onto the skin  daily. 28 patch 0  . nicotine polacrilex (NICORETTE) 4 MG gum Take 1 each (4 mg total) by mouth as needed for smoking cessation. 100 tablet 0  . ofloxacin (OCUFLOX) 0.3 % ophthalmic solution Place 1 drop into both eyes 4 (four) times daily. (Patient not taking: Reported on 07/02/2016) 5 mL 0  . olopatadine (PATANOL) 0.1 % ophthalmic solution Place 1 drop into both eyes daily as needed for allergies (itchiness).     . polyethylene glycol (MIRALAX / GLYCOLAX) packet Take 17 g by mouth daily as needed for mild constipation (Pt takes 3-4x/week).     .  pravastatin (PRAVACHOL) 20 MG tablet Take 1 tablet (20 mg total) by mouth daily. 90 tablet 0  . ranitidine (ZANTAC) 150 MG tablet Take 1 tablet (150 mg total) by mouth 2 (two) times daily. 60 tablet 2  . sodium chloride (OCEAN) 0.65 % SOLN nasal spray Place 2 sprays into both nostrils as needed for congestion. 1 Bottle 11   No current facility-administered medications on file prior to visit.     Allergies  Allergen Reactions  . Aspirin Other (See Comments)    Causes her stomach pain.  . Ibuprofen Other (See Comments)    Causes stomach pain  . Penicillins Other (See Comments)    Localized whelps and redness after injection Has patient had a PCN reaction causing immediate rash, facial/tongue/throat swelling, SOB or lightheadedness with hypotension: No Has patient had a PCN reaction causing severe rash involving mucus membranes or skin necrosis: Yes Has patient had a PCN reaction that required hospitalization No Has patient had a PCN reaction occurring within the last 10 years: No If all of the above answers are "NO", then may proceed with Cephalosporin use.     Social History   Social History  . Marital status: Married    Spouse name: N/A  . Number of children: N/A  . Years of education: N/A   Occupational History  . Not on file.   Social History Main Topics  . Smoking status: Current Every Day Smoker    Packs/day: 1.50    Years: 40.00    Types: Cigarettes  . Smokeless tobacco: Never Used  . Alcohol use No     Comment: hx alcohol abuse- drank >fifth of gin daily/  states Quit 2002  . Drug use: No  . Sexual activity: Not on file   Other Topics Concern  . Not on file   Social History Narrative  . No narrative on file    No family history on file.  Past Surgical History:  Procedure Laterality Date  . DILATION AND CURETTAGE OF UTERUS    . INSERTION OF MESH N/A 07/20/2014   Procedure: INSERTION OF MESH;  Surgeon: Fanny Skates, MD;  Location: [facility];  Service:  General;  Laterality: N/A;  . KNEE ARTHROSCOPY Left 2000  . TOTAL ABDOMINAL HYSTERECTOMY W/ BILATERAL SALPINGOOPHORECTOMY  1995  . TRANSTHORACIC ECHOCARDIOGRAM  07-22-2014   mild LVH,  60-65%,  grade I diastolic dysfunction/  mild LAE  . VENTRAL HERNIA REPAIR N/A 07/20/2014   Procedure: LAPAROSCOPIC REPAIR INCARCARATED VENTRAL HERNIA AND UMBILICAL HERNIA;  Surgeon: Fanny Skates, MD;  Location: [facility];  Service: General;  Laterality: N/A;    ROS: Review of Systems Negative except as stated above  PHYSICAL EXAM: BP 127/71   Pulse 76   Temp 98.1 F (36.7 C) (Oral)   Resp 16   Wt 226 lb (102.5 kg)   SpO2 97%   BMI 34.36  kg/m   Wt Readings from Last 3 Encounters:  03/14/17 226 lb (102.5 kg)  07/02/16 224 lb 3.2 oz (101.7 kg)  03/09/16 190 lb (86.2 kg)   Physical Exam General appearance - alert, well appearing, and in no distress Mental status - alert, oriented to person, place, and time, normal mood, behavior, speech, dress, motor activity, and thought processes Neck - supple, no significant adenopathy Mouth: moderate plaque build up around teeth. Chest - clear to auscultation, no wheezes, rales or rhonchi, symmetric air entry Heart - normal rate, regular rhythm, normal S1, S2, no murmurs, rubs, clicks or gallops Musculoskeletal -LT shoulder: no point tenderness. Discomfort with elevation above head. Drop arm test negative. Power 5/5 both [place] Extremities - peripheral pulses normal, no pedal edema, no clubbing or cyanosis  Results for orders placed or performed in visit on 03/14/17  POCT glucose (manual entry)  Result Value Ref Range   POC Glucose 76 70 - 99 mg/dl   ASSESSMENT AND PLAN: 1. Controlled type 2 diabetes mellitus without complication, without long-term current use of insulin ([place]) -pt wants the [facility] meter. Will send rxn to her pharmacy but not sure if her insurance will cover for it. Give small can of soda here in office due to low BS. Pt did not eat  breakfast and it was after 11 that she was seen today. Advise to avoid skipping meals. Given info about food pantries in the area to help keep groceries in her house - POCT glucose (manual entry) - Comprehensive metabolic panel; Future - CBC; Future - Lipid panel; Future  2. Chronic maxillary sinusitis - doxycycline (VIBRA-TABS) 100 MG tablet; Take 1 tablet (100 mg total) by mouth 2 (two) times daily.  Dispense: 20 tablet; Refill: 0  3. Tobacco abuse She will try the patches when she feels mental ready to give trail of quitting  4. Gum disease Dental referral  5. Chronic left shoulder pain - DG Shoulder Left; Future - Ambulatory referral to Orthopedic Surgery  HM: pt declined flu shot today. States she will come back in Nov as nurse only visit to have it done. Patient was given the opportunity to ask questions.  Patient verbalized understanding of the plan and was able to repeat key elements of the plan.   Orders Placed This Encounter  Procedures  . DG Shoulder Left  . Comprehensive metabolic panel  . CBC  . Lipid panel  . Ambulatory referral to Orthopedic Surgery  . POCT glucose (manual entry)     Requested Prescriptions   Signed Prescriptions Disp Refills  . doxycycline (VIBRA-TABS) 100 MG tablet 20 tablet 0    Sig: Take 1 tablet (100 mg total) by mouth 2 (two) times daily.    Return in about 3 months (around 06/13/2017).  Karle Plumber, MD, FACP

## 2017-04-12 ENCOUNTER — Ambulatory Visit (INDEPENDENT_AMBULATORY_CARE_PROVIDER_SITE_OTHER): Payer: Medicare Other | Admitting: Orthopedic Surgery

## 2017-04-17 ENCOUNTER — Other Ambulatory Visit: Payer: Self-pay | Admitting: Internal Medicine

## 2017-04-18 ENCOUNTER — Other Ambulatory Visit: Payer: Self-pay | Admitting: Pharmacist

## 2017-04-18 ENCOUNTER — Other Ambulatory Visit: Payer: Self-pay | Admitting: Internal Medicine

## 2017-04-18 DIAGNOSIS — M170 Bilateral primary osteoarthritis of knee: Secondary | ICD-10-CM

## 2017-04-18 DIAGNOSIS — I10 Essential (primary) hypertension: Secondary | ICD-10-CM

## 2017-04-18 MED ORDER — MELOXICAM 15 MG PO TABS: 15.0000 mg | ORAL_TABLET | Freq: Every day | ORAL | 0 refills | Status: DC

## 2017-04-18 MED ORDER — HYDROCHLOROTHIAZIDE 25 MG PO TABS: 25.0000 mg | ORAL_TABLET | Freq: Every day | ORAL | 0 refills | Status: DC

## 2017-04-19 ENCOUNTER — Other Ambulatory Visit: Payer: Self-pay | Admitting: Internal Medicine

## 2017-04-27 ENCOUNTER — Other Ambulatory Visit: Payer: Self-pay | Admitting: Internal Medicine

## 2017-04-29 ENCOUNTER — Telehealth: Payer: Self-pay | Admitting: Internal Medicine

## 2017-04-29 ENCOUNTER — Other Ambulatory Visit: Payer: Self-pay | Admitting: Internal Medicine

## 2017-04-29 DIAGNOSIS — I10 Essential (primary) hypertension: Secondary | ICD-10-CM

## 2017-04-29 MED ORDER — AMLODIPINE BESYLATE 10 MG PO TABS: 10.0000 mg | ORAL_TABLET | Freq: Every day | ORAL | 0 refills | Status: DC

## 2017-04-29 MED ORDER — HYDROCHLOROTHIAZIDE 25 MG PO TABS: 25.0000 mg | ORAL_TABLET | Freq: Every day | ORAL | 0 refills | Status: DC

## 2017-04-29 NOTE — Telephone Encounter (Signed)
Pt. Called requesting a refill on the following medication:  amLODipine (NORVASC) 10 MG tablet  hydrochlorothiazide (HYDRODIURIL) 25 MG tablet  Pt. States that the pharmacy never received the Rx for [facility]

## 2017-04-29 NOTE — Telephone Encounter (Signed)
Pt. Called requesting a refill on the following medication:  amLODipine (NORVASC) 10 MG tablet  hydrochlorothiazide (HYDRODIURIL) 25 MG tablet  Pt. States that the pharmacy never received the Rx for Hydrochlorothiazide.  Pt. Uses [facility]. Please f/u with pt.

## 2017-04-29 NOTE — Telephone Encounter (Signed)
Resent both to [facility]

## 2017-05-15 ENCOUNTER — Ambulatory Visit (INDEPENDENT_AMBULATORY_CARE_PROVIDER_SITE_OTHER): Payer: Medicare Other | Admitting: Orthopedic Surgery

## 2017-05-15 ENCOUNTER — Encounter (INDEPENDENT_AMBULATORY_CARE_PROVIDER_SITE_OTHER): Payer: Self-pay | Admitting: Orthopedic Surgery

## 2017-05-15 ENCOUNTER — Ambulatory Visit (INDEPENDENT_AMBULATORY_CARE_PROVIDER_SITE_OTHER): Payer: Medicare Other

## 2017-05-15 DIAGNOSIS — G8929 Other chronic pain: Secondary | ICD-10-CM

## 2017-05-15 DIAGNOSIS — M25512 Pain in left shoulder: Secondary | ICD-10-CM

## 2017-05-15 NOTE — Progress Notes (Signed)
Office Visit Note   Patient: Sarah [name]           Date of Birth: 05-23-1956           MRN: [id number] Visit Date: 05/15/2017 Requested by: Sarah Pier, MD [place], [place] [address] PCP: Sarah Pier, MD  Subjective: Chief Complaint  Patient presents with  . Left Shoulder - Pain    HPI: Sarah [name] is a 61 year old patient with left shoulder pain.  Been going on for 3 months.  She denies any history of injury.  She is currently in a difficult social situation.  It is hard for her to reach and she reports a lot of spasm in the shoulder girdle area.  She is taking no medications for this except from obex as well as bareback and body.  Caution her that she should really take one or the other.  Patient has diabetes but she doesn't really check her blood glucose much.  She uses some type of lidocaine rub on the shoulder which does help.  She denies any weakness or mechanical symptoms in the shoulder denies much in the way of radicular symptoms.              ROS: All systems reviewed are negative as they relate to the chief complaint within the history of present illness.  Patient denies  fevers or chills.   Assessment & Plan: Visit Diagnoses:  1. Chronic left shoulder pain     Plan: Impression is left shoulder pain with pretty normal exam in terms of no evidence of frozen shoulder or overt rotator cuff tear.  She does have some calcification in the anterior portion of the shoulder seen on plain radiographs.  I think with three-month history this area to further workup.  Would consider subacromial injection but I don't know how good her blood glucose control and management is at this time.  We can potential he do that as well as workup for her knees on return visit after MRI scan on the shoulder  Follow-Up Instructions: Return for after MRI.   Orders:  Orders Placed This Encounter  Procedures  . XR Shoulder Left  . MR Shoulder Left w/ contrast  . Arthrogram    No orders of the defined types were placed in this encounter.     Procedures: No procedures performed   Clinical Data: No additional findings.  Objective: Vital Signs: There were no vitals taken for this visit.  Physical Exam:   Constitutional: Patient appears well-developed HEENT:  Head: Normocephalic Eyes:EOM are normal Neck: Normal range of motion Cardiovascular: Normal rate Pulmonary/chest: Effort normal Neurologic: Patient is alert Skin: Skin is warm Psychiatric: Patient has normal mood and affect    Ortho Exam: Orthopedic exam demonstrates antalgic gait to the right.  Neck range of motion is good.  Patient has 5 out of 5 grip EPL FPL interosseous wrist flexion-extension biceps triceps and deltoid strength.  Radial pulses intact bilaterally.  No restriction of external rotation at 15 of abduction.  No other masses lymph adenopathy or skin changes noted in the shoulder girdle region.  Range of motion is full.  Strength is excellent.  Negative apprehension.  No acromioclavicular joint tenderness.  Specialty Comments:  No specialty comments available.  Imaging: Xr Shoulder Left  Result Date: 05/15/2017 AP axillary outlet view left shoulder reviewed.  Shoulder is located.  There is no fracture.  No arthritis is present.  Acromioclavicular joint relationship is normal.  There  is calcification anterior to the coracoid process as well as anterior to the greater tuberosity seen on the outlet view this is consistent potentially with calcific tendinitis around the greater tuberosity.    PMFS History: Patient Active Problem List   Diagnosis Date Noted  . Lipoma 01/10/2017  . Gum disease 01/10/2017  . DM neuropathy, type II diabetes mellitus ([place]) 12/12/2015  . GERD (gastroesophageal reflux disease) 03/02/2015  . Abdominal wall hernia 01/05/2013  . Diabetes type 2, controlled ([place])   . HTN (hypertension) 06/27/2011  . TOBACCO ABUSE 02/05/2007  . CONSTIPATION NOS  02/05/2007  . OSTEOARTHROSIS, GENERALIZED, MULTIPLE SITES 02/05/2007  . LOW BACK PAIN 02/05/2007   Past Medical History:  Diagnosis Date  . Acute pancreatitis    01-13-2015 per ED note  . Adrenal adenoma    bilateral  . Arthritis   . Bilateral lower extremity edema   . Complication of anesthesia    post-op 07-20-2014 surgery post-op Hypoxia due to acute CHF/ mild Pulmonary edema  . COPD (chronic obstructive pulmonary disease) ([place])   . Gastritis    01-13-2015  per ED note  . GERD (gastroesophageal reflux disease)   . Headache   . History of acute congestive heart failure    07-22-2014   post-op surgery 07-20-2014  . History of acute pancreatitis    01-04-2013   . History of alcohol abuse    state quit 2002/  drank daily >fifth of gin  . History of DVT of lower extremity    2000  post op left knee surgery  . History of pulmonary edema    mild -  07-22-2014  post-op  surgery 07-20-2014  . Hypertension   . Type 2 diabetes mellitus ([place])   . Ulnar nerve compression    right elbow    History reviewed. No pertinent family history.  Past Surgical History:  Procedure Laterality Date  . DILATION AND CURETTAGE OF UTERUS    . INSERTION OF MESH N/A 07/20/2014   Procedure: INSERTION OF MESH;  Surgeon: Fanny Skates, MD;  Location: [facility];  Service: General;  Laterality: N/A;  . KNEE ARTHROSCOPY Left 2000  . TOTAL ABDOMINAL HYSTERECTOMY W/ BILATERAL SALPINGOOPHORECTOMY  1995  . TRANSTHORACIC ECHOCARDIOGRAM  07-22-2014   mild LVH,  60-65%,  grade I diastolic dysfunction/  mild LAE  . VENTRAL HERNIA REPAIR N/A 07/20/2014   Procedure: LAPAROSCOPIC REPAIR INCARCARATED VENTRAL HERNIA AND UMBILICAL HERNIA;  Surgeon: Fanny Skates, MD;  Location: [facility];  Service: General;  Laterality: N/A;   Social History   Occupational History  . Not on file  Tobacco Use  . Smoking status: Current Every Day Smoker    Packs/day: 1.50    Years: 40.00    Pack years: 60.00    Types: Cigarettes  .  Smokeless tobacco: Never Used  Substance and Sexual Activity  . Alcohol use: No    Comment: hx alcohol abuse- drank >fifth of gin daily/  states Quit 2002  . Drug use: No  . Sexual activity: Not on file

## 2017-05-17 ENCOUNTER — Other Ambulatory Visit: Payer: Self-pay | Admitting: Internal Medicine

## 2017-05-17 ENCOUNTER — Other Ambulatory Visit: Payer: Self-pay | Admitting: Pharmacist

## 2017-05-17 DIAGNOSIS — I10 Essential (primary) hypertension: Secondary | ICD-10-CM

## 2017-05-17 DIAGNOSIS — M109 Gout, unspecified: Secondary | ICD-10-CM

## 2017-05-17 MED ORDER — GLIMEPIRIDE 1 MG PO TABS: 1.0000 mg | ORAL_TABLET | Freq: Every day | ORAL | 0 refills | Status: DC

## 2017-05-17 MED ORDER — HYDROCHLOROTHIAZIDE 25 MG PO TABS: 25.0000 mg | ORAL_TABLET | Freq: Every day | ORAL | 0 refills | Status: DC

## 2017-05-28 ENCOUNTER — Inpatient Hospital Stay
Admission: RE | Admit: 2017-05-28 | Discharge: 2017-05-28 | Disposition: A | Discharge status: Home / Self Care | Payer: Medicare Other | Source: Ambulatory Visit | Attending: Orthopedic Surgery | Admitting: Orthopedic Surgery

## 2017-05-28 ENCOUNTER — Inpatient Hospital Stay: Admission: RE | Admit: 2017-05-28 | Payer: Medicare Other | Source: Ambulatory Visit

## 2017-06-05 ENCOUNTER — Ambulatory Visit (INDEPENDENT_AMBULATORY_CARE_PROVIDER_SITE_OTHER): Payer: Medicare Other | Admitting: Orthopedic Surgery

## 2017-06-28 ENCOUNTER — Other Ambulatory Visit: Payer: Self-pay | Admitting: Internal Medicine

## 2017-06-28 ENCOUNTER — Other Ambulatory Visit: Payer: Self-pay | Admitting: Pharmacist

## 2017-06-28 DIAGNOSIS — M170 Bilateral primary osteoarthritis of knee: Secondary | ICD-10-CM

## 2017-06-28 DIAGNOSIS — I10 Essential (primary) hypertension: Secondary | ICD-10-CM

## 2017-06-28 DIAGNOSIS — M109 Gout, unspecified: Secondary | ICD-10-CM

## 2017-06-28 MED ORDER — GLIMEPIRIDE 1 MG PO TABS: 1.0000 mg | ORAL_TABLET | Freq: Every day | ORAL | 0 refills | Status: DC

## 2017-06-28 MED ORDER — MELOXICAM 15 MG PO TABS: 15.0000 mg | ORAL_TABLET | Freq: Every day | ORAL | 0 refills | Status: DC

## 2017-06-28 MED ORDER — HYDROCHLOROTHIAZIDE 25 MG PO TABS: 25.0000 mg | ORAL_TABLET | Freq: Every day | ORAL | 0 refills | Status: DC

## 2017-07-19 ENCOUNTER — Other Ambulatory Visit: Payer: Self-pay | Admitting: Pharmacist

## 2017-07-19 MED ORDER — INFLUENZA VAC SPLIT QUAD 0.5 ML IM SUSY: 0.5000 mL | PREFILLED_SYRINGE | Freq: Once | INTRAMUSCULAR | 0 refills | Status: AC

## 2017-07-19 MED FILL — FLUARIX QUADRIVALENT 0.5 ML: 0.5 | 1 days supply | Qty: 1 | Fill #0

## 2017-08-12 ENCOUNTER — Encounter: Payer: Self-pay | Admitting: Internal Medicine

## 2017-08-12 ENCOUNTER — Other Ambulatory Visit: Payer: Self-pay

## 2017-08-12 ENCOUNTER — Ambulatory Visit: Payer: Medicare Other | Attending: Internal Medicine | Admitting: Internal Medicine

## 2017-08-12 VITALS — BP 126/70 | HR 89 | Temp 97.8°F | Resp 16 | Ht 68.0 in | Wt 243.2 lb

## 2017-08-12 DIAGNOSIS — J449 Chronic obstructive pulmonary disease, unspecified: Secondary | ICD-10-CM | POA: Insufficient documentation

## 2017-08-12 DIAGNOSIS — M545 Low back pain: Secondary | ICD-10-CM | POA: Diagnosis not present

## 2017-08-12 DIAGNOSIS — M1990 Unspecified osteoarthritis, unspecified site: Secondary | ICD-10-CM | POA: Diagnosis not present

## 2017-08-12 DIAGNOSIS — Z0000 Encounter for general adult medical examination without abnormal findings: Secondary | ICD-10-CM | POA: Diagnosis not present

## 2017-08-12 DIAGNOSIS — I10 Essential (primary) hypertension: Secondary | ICD-10-CM | POA: Insufficient documentation

## 2017-08-12 DIAGNOSIS — Z7984 Long term (current) use of oral hypoglycemic drugs: Secondary | ICD-10-CM | POA: Diagnosis not present

## 2017-08-12 DIAGNOSIS — Z1231 Encounter for screening mammogram for malignant neoplasm of breast: Secondary | ICD-10-CM

## 2017-08-12 DIAGNOSIS — Z886 Allergy status to analgesic agent status: Secondary | ICD-10-CM | POA: Diagnosis not present

## 2017-08-12 DIAGNOSIS — E1140 Type 2 diabetes mellitus with diabetic neuropathy, unspecified: Secondary | ICD-10-CM | POA: Insufficient documentation

## 2017-08-12 DIAGNOSIS — F17200 Nicotine dependence, unspecified, uncomplicated: Secondary | ICD-10-CM | POA: Insufficient documentation

## 2017-08-12 DIAGNOSIS — Z1211 Encounter for screening for malignant neoplasm of colon: Secondary | ICD-10-CM

## 2017-08-12 DIAGNOSIS — K439 Ventral hernia without obstruction or gangrene: Secondary | ICD-10-CM | POA: Insufficient documentation

## 2017-08-12 DIAGNOSIS — Z1239 Encounter for other screening for malignant neoplasm of breast: Secondary | ICD-10-CM

## 2017-08-12 DIAGNOSIS — K219 Gastro-esophageal reflux disease without esophagitis: Secondary | ICD-10-CM | POA: Diagnosis not present

## 2017-08-12 DIAGNOSIS — Z79899 Other long term (current) drug therapy: Secondary | ICD-10-CM | POA: Insufficient documentation

## 2017-08-12 DIAGNOSIS — Z880 Allergy status to penicillin: Secondary | ICD-10-CM | POA: Diagnosis not present

## 2017-08-12 DIAGNOSIS — Z7982 Long term (current) use of aspirin: Secondary | ICD-10-CM | POA: Diagnosis not present

## 2017-08-12 DIAGNOSIS — E785 Hyperlipidemia, unspecified: Secondary | ICD-10-CM | POA: Diagnosis not present

## 2017-08-12 DIAGNOSIS — M109 Gout, unspecified: Secondary | ICD-10-CM | POA: Diagnosis not present

## 2017-08-12 MED ORDER — PRAVASTATIN SODIUM 20 MG PO TABS: 20.0000 mg | ORAL_TABLET | Freq: Every day | ORAL | 3 refills | Status: AC

## 2017-08-12 MED ORDER — LISINOPRIL 5 MG PO TABS: 5.0000 mg | ORAL_TABLET | Freq: Every morning | ORAL | 0 refills | Status: DC

## 2017-08-12 NOTE — Progress Notes (Signed)
Patient ID: Sarah [name], female    DOB: 08-02-1955  MRN: [id number]   Annual Wellness Visit  Sarah [name] is a 62 y.o. Female who presents for an her welcome to [facility]  Wellness Visit.  Patient states she was called by her insurance and told that she needs to have her wellness visit. 62 year old with history of diabetes, hypertension, tobacco dependence, osteoarthritis of multiple sites, COPD, gout, and hyperlipidemia.  Patient Active Problem List   Diagnosis Date Noted  . Lipoma 01/10/2017  . Gum disease 01/10/2017  . DM neuropathy, type II diabetes mellitus ([place]) 12/12/2015  . GERD (gastroesophageal reflux disease) 03/02/2015  . Abdominal wall hernia 01/05/2013  . Diabetes type 2, controlled ([place])   . HTN (hypertension) 06/27/2011  . TOBACCO ABUSE 02/05/2007  . CONSTIPATION NOS 02/05/2007  . OSTEOARTHROSIS, GENERALIZED, MULTIPLE SITES 02/05/2007  . LOW BACK PAIN 02/05/2007    Current Outpatient Medications on File Prior to Visit  Medication Sig Dispense Refill  . Acetaminophen-Aspirin Buffered (EXCEDRIN BACK & BODY PO) Take 1 tablet by mouth 5 (five) times daily as needed (pain).     [name] albuterol (PROAIR HFA) 108 (90 Base) MCG/ACT inhaler INHALE 2 PUFFS BY MOUTH EVERY 4 HOURS AS NEEDED FOR WHEEZING OR SHORTNESS OF BREATH 8.5 g 1  . allopurinol (ZYLOPRIM) 100 MG tablet take 1 tablet by mouth once daily 90 tablet 0  . amLODipine (NORVASC) 10 MG tablet Take 1 tablet (10 mg total) daily by mouth. 90 tablet 0  . amLODipine (NORVASC) 10 MG tablet take 1 tablet by mouth once daily 90 tablet 0  . aspirin EC 81 MG tablet Take 1 tablet (81 mg total) by mouth daily. 90 tablet 3  . blood glucose meter kit and supplies KIT Libra Freestyle meter 1 each 0  . budesonide-formoterol (SYMBICORT) 160-4.5 MCG/ACT inhaler Inhale 2 puffs into the lungs 2 (two) times daily. 1 Inhaler 2  . glimepiride (AMARYL) 1 MG tablet Take 1 tablet (1 mg total) by mouth daily with breakfast. 30 tablet 0  .  hydrochlorothiazide (HYDRODIURIL) 25 MG tablet Take 1 tablet (25 mg total) by mouth daily. 30 tablet 0  . ipratropium (ATROVENT HFA) 17 MCG/ACT inhaler Inhale 2 puffs into the lungs every 6 (six) hours as needed for wheezing. (Patient not taking: Reported on 07/02/2016) 1 Inhaler 12  . magnesium hydroxide (MILK OF MAGNESIA) 400 MG/5ML suspension Take 30 mLs by mouth daily as needed for mild constipation.    . meloxicam (MOBIC) 15 MG tablet Take 1 tablet (15 mg total) by mouth daily. 30 tablet 0  . metFORMIN (GLUCOPHAGE) 500 MG tablet take 1 tablet by mouth once daily with BREAKFAST 90 tablet 0  . nicotine (NICODERM CQ) 21 mg/24hr patch Place 1 patch (21 mg total) onto the skin daily. 28 patch 0  . nicotine polacrilex (NICORETTE) 4 MG gum Take 1 each (4 mg total) by mouth as needed for smoking cessation. 100 tablet 0  . olopatadine (PATANOL) 0.1 % ophthalmic solution Place 1 drop into both eyes daily as needed for allergies (itchiness).     . polyethylene glycol (MIRALAX / GLYCOLAX) packet Take 17 g by mouth daily as needed for mild constipation (Pt takes 3-4x/week).     . ranitidine (ZANTAC) 150 MG tablet Take 1 tablet (150 mg total) by mouth 2 (two) times daily. 60 tablet 2  . sodium chloride (OCEAN) 0.65 % SOLN nasal spray Place 2 sprays into both nostrils as needed for congestion. 1 Bottle 11  No current facility-administered medications on file prior to visit.     Allergies  Allergen Reactions  . Aspirin Other (See Comments)    Causes her stomach pain.  . Ibuprofen Other (See Comments)    Causes stomach pain  . Penicillins Other (See Comments)    Localized whelps and redness after injection Has patient had a PCN reaction causing immediate rash, facial/tongue/throat swelling, SOB or lightheadedness with hypotension: No Has patient had a PCN reaction causing severe rash involving mucus membranes or skin necrosis: Yes Has patient had a PCN reaction that required hospitalization No Has  patient had a PCN reaction occurring within the last 10 years: No If all of the above answers are "NO", then may proceed with Cephalosporin use.      Health Risk Assessment The patient has completed a Health Risk Assessment. This has been reviewed with the patient and has been scanned into the [facility] system as a separate document.  Of note she reports severe bodily pain in the past 4 weeks and rates her overall health as fair.  As answer to the question how often do you have trouble taking medications the way you have been told to take them, patient answered that she seldom takes meds as prescribed.  When asked to elaborate on that patient states that she takes all of her medicines at night not in mornings.  She feels very confident that she can control and manage most of her health problems   Current Medical Providers and Suppliers The providers who are involved in the care of this patient are listed above. Additional providers and suppliers are listed below: [facility]: Dr. Marlou Sa whom she is seeing for her left shoulder. Ophthalmology: Dr. Schuyler Amor  Age-appropriate Screening Schedule Refer to the list in the Health Maintenance section for an age appropriated screening completed by this patient. Additional screening recommendations are listed below in the plan section. The patient has been provided with a written plan.   -Patient is due for mammogram.  She is agreeable to order this. Due for colon cancer screening.  She declines colonoscopy but is agreeable to doing the colon guard test. A1c and foot exam will be done on her next routine follow-up to address diabetes Health Maintenance Due  Topic Date Due  . FOOT EXAM  07/23/1963  . MAMMOGRAM  03/31/2017  . HEMOGLOBIN A1C  07/13/2017    Depression Screen Over the past two weeks have you:     Felt down or depressed? no     Had little interest or pleasure in doing things? no     depression    Functional Ability/Safety Screen 1.  Falls Risk: Does the patient need assistance with ambulation? Yes - sometimes.  Uses a walker occasionally when she has to do a lot of walking Does the patient have a history of a fall in the last 90 days? no Is the patient at risk for falls? No but house cluttered right now.  Just downsized from a full house to 3 bedroom.  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 Was the patient's timed "Get Up and Go Test" unsteady or longer than 30 seconds? No.  Completed in 8 sec  2. Does the patient need help with: Ron Parker index)         Bathing: no         Dressing : no         Toileting: no  Transferring: no         Continence: no         Feeding: no           3. Does the home have:         Rugs in the hallway: no         Grab bars in the bathroom: no         Handrails on the stairs:no.  No stairs in house.         Stairs in home: no, except attic         Poor lightning: yes.  Plans to buy higher volt bulbs           Hearing Evaluation:     Do you have trouble hearing the television when others do not? no     Do you have to strain to hear/understand conversations? no  Advanced Care Planning     Patient has executed an Advance Directive: no     If no, patient was given the opportunity to execute an Advance Directive today? Yes     This patient has the ability to prepare an Advance Directive: yes     Provider is willing to follow the patient's wishes: yes      Cognitive Assessment: Does the patient have evidence of cognitive impairment? No The patient does not have evidence of a change in mood/affect, appearance, speech, memory or motor skills.   Identification of Risk Factors: Risk factors include: none   ? PHYSICAL EXAM: Vitals:   08/12/17 1350  BP: 126/70  Pulse: 89  Resp: 16  Temp: 97.8 F (36.6 C)  TempSrc: Oral  SpO2: 97%  Weight: 243 lb 3.2 oz (110.3 kg)  Height: 5' 8" (1.727 m)   Body mass index is 36.98  kg/m. General appearance - alert, well appearing, and in no distress Mental status - alert, oriented to person, place, and time, normal mood, behavior, speech, dress, motor activity, and thought processes Mouth - mucous membranes moist, pharynx normal without lesions  Eyes: Screening vision 20 out of 20 bilaterally.  20/40 on the left and 20/30 on the right Neck - supple, no significant adenopathy Chest - clear to auscultation, no wheezes, rales or rhonchi, symmetric air entry Heart - normal rate, regular rhythm, normal S1, S2, no murmurs, rubs, clicks or gallops Extremities - peripheral pulses normal, no pedal edema, no clubbing or cyanosis Patient scored 5/5 on mini cog exam. EKG:  NSR with sinus arrhythmia.  No acute ischemic changes.  No LVH.  ASSESSMENT AND PLAN: Patient Self-Management and Personalized Health Advice The patient has been provided with information about: Advanced directives.  Discussed with her advance health planning.  She states that in the event she becomes incapacitated she would like her daughter to make decisions regarding her health.  I have encouraged her to talk with her daughter about this and actually draw up an advanced directive plan.  Printed information given to patient about advanced directives.  I have also given her a copy of [place] advance healthcare directive form  During the course of the visit the patient was educated and counseled about appropriate screening and preventive services including: Need for mammogram and colon cancer screening.  She is agreeable to having mammogram done doing colon guard test. Patient also encouraged to discontinue smoking.  She is not ready to give a trial of quitting.  She does have nicotine patches at home which she will use when she is  ready to give a trial of smoking cessation           Orders placed during this encounter include: Orders Placed This Encounter  Procedures  . Fecal occult blood,  imunochemical([facility])  . MM Digital Screening    Standing Status:   Future    Standing Expiration Date:   10/11/2018    Order Specific Question:   Reason for Exam (SYMPTOM  OR DIAGNOSIS REQUIRED)    Answer:   screening    Order Specific Question:   Preferred imaging location?    Answer:   [facility]  . EKG 12-Lead     Return in about 6 weeks (around 09/23/2017).   ? An after visit summary with all of these plans was given to the patient.

## 2017-08-12 NOTE — Patient Instructions (Addendum)
I have provided a packet about Advance directives.  Please read it and discuss with your daughter.  Consider doing one.    I have referred you for mammogram.  Please use the stool card and mail in after use.    Please use the nicotine patches when you are ready to give a trail of quitting smoking.   [place] to schedule your mammogram [phone]  Advance Directive Advance directives are legal documents that let you make choices ahead of time about your health care and medical treatment in case you become unable to communicate for yourself. Advance directives are a way for you to communicate your wishes to family, friends, and health care providers. This can help convey your decisions about end-of-life care if you become unable to communicate. Discussing and writing advance directives should happen over time rather than all at once. Advance directives can be changed depending on your situation and what you want, even after you have signed the advance directives. If you do not have an advance directive, some states assign family decision makers to act on your behalf based on how closely you are related to them. Each state has its own laws regarding advance directives. You may want to check with your health care provider, attorney, or state representative about the laws in your state. There are different types of advance directives, such as:  Medical power of attorney.  Living will.  Do not resuscitate (DNR) or do not attempt resuscitation (DNAR) order.  Health care proxy and medical power of attorney A health care proxy, also called a health care agent, is a person who is appointed to make medical decisions for you in cases in which you are unable to make the decisions yourself. Generally, people choose someone they know well and trust to represent their preferences. Make sure to ask this person for an agreement to act as your proxy. A proxy may have to exercise  judgment in the event of a medical decision for which your wishes are not known. A medical power of attorney is a legal document that names your health care proxy. Depending on the laws in your state, after the document is written, it may also need to be:  Signed.  Notarized.  Dated.  Copied.  Witnessed.  Incorporated into your medical record.  You may also want to appoint someone to manage your financial affairs in a situation in which you are unable to do so. This is called a durable power of attorney for finances. It is a separate legal document from the durable power of attorney for health care. You may choose the same person or someone different from your health care proxy to act as your agent in financial matters. If you do not appoint a proxy, or if there is a concern that the proxy is not acting in your best interests, a court-appointed guardian may be designated to act on your behalf. Living will A living will is a set of instructions documenting your wishes about medical care when you cannot express them yourself. Health care providers should keep a copy of your living will in your medical record. You may want to give a copy to family members or friends. To alert caregivers in case of an emergency, you can place a card in your wallet to let them know that you have a living will and where they can find it. A living will is used if you become:  Terminally ill.  Incapacitated.  Unable  to communicate or make decisions.  Items to consider in your living will include:  The use or non-use of life-sustaining equipment, such as dialysis machines and breathing machines (ventilators).  A DNR or DNAR order, which is the instruction not to use cardiopulmonary resuscitation (CPR) if breathing or heartbeat stops.  The use or non-use of tube feeding.  Withholding of food and fluids.  Comfort (palliative) care when the goal becomes comfort rather than a cure.  Organ and tissue  donation.  A living will does not give instructions for distributing your money and property if you should pass away. It is recommended that you seek the advice of a lawyer when writing a will. Decisions about taxes, beneficiaries, and asset distribution will be legally binding. This process can relieve your family and friends of any concerns surrounding disputes or questions that may come up about the distribution of your assets. DNR or DNAR A DNR or DNAR order is a request not to have CPR in the event that your heart stops beating or you stop breathing. If a DNR or DNAR order has not been made and shared, a health care provider will try to help any patient whose heart has stopped or who has stopped breathing. If you plan to have surgery, talk with your health care provider about how your DNR or DNAR order will be followed if problems occur. Summary  Advance directives are the legal documents that allow you to make choices ahead of time about your health care and medical treatment in case you become unable to communicate for yourself.  The process of discussing and writing advance directives should happen over time. You can change the advance directives, even after you have signed them.  Advance directives include DNR or DNAR orders, living wills, and designating an agent as your medical power of attorney. This information is not intended to replace advice given to you by your health care provider. Make sure you discuss any questions you have with your health care provider. Document Released: 09/11/2007 Document Revised: 04/23/2016 Document Reviewed: 04/23/2016 Elsevier Interactive Patient Education  2017 [employer].

## 2017-08-22 ENCOUNTER — Telehealth: Payer: Self-pay | Admitting: Internal Medicine

## 2017-08-22 MED ORDER — METFORMIN HCL 500 MG PO TABS: ORAL_TABLET | ORAL | 0 refills | Status: DC

## 2017-08-22 NOTE — Telephone Encounter (Signed)
Refilled and sent to requested [facility] ([facility] a [facility])

## 2017-08-22 NOTE — Telephone Encounter (Signed)
Pt. Called requesting a refill on Metformin. Pt. Would like medication sent to [place] on [facility]. Please f/u

## 2017-09-12 ENCOUNTER — Ambulatory Visit
Admission: RE | Admit: 2017-09-12 | Discharge: 2017-09-12 | Disposition: A | Discharge status: Home / Self Care | Payer: Medicare Other | Source: Ambulatory Visit | Attending: Internal Medicine | Admitting: Internal Medicine

## 2017-09-12 DIAGNOSIS — Z1239 Encounter for other screening for malignant neoplasm of breast: Secondary | ICD-10-CM

## 2017-09-13 ENCOUNTER — Telehealth: Payer: Self-pay

## 2017-09-13 ENCOUNTER — Telehealth: Payer: Self-pay | Admitting: Internal Medicine

## 2017-09-13 NOTE — Telephone Encounter (Signed)
Contacted pt to go over mm results pt didn't answer and was unable to lvm  If pt calls back please give results: Mammogram is negative

## 2017-09-13 NOTE — Telephone Encounter (Signed)
Patient returned a call from the office and I informed her that the mammogram was negative. Per RMA Shanda Howells

## 2017-09-20 ENCOUNTER — Encounter: Payer: Self-pay | Admitting: Internal Medicine

## 2017-09-20 ENCOUNTER — Ambulatory Visit: Payer: Medicare Other | Attending: Internal Medicine | Admitting: Internal Medicine

## 2017-09-20 VITALS — BP 115/76 | HR 71 | Temp 97.9°F | Resp 16 | Wt 222.8 lb

## 2017-09-20 DIAGNOSIS — M109 Gout, unspecified: Secondary | ICD-10-CM | POA: Diagnosis not present

## 2017-09-20 DIAGNOSIS — Z90710 Acquired absence of both cervix and uterus: Secondary | ICD-10-CM | POA: Diagnosis not present

## 2017-09-20 DIAGNOSIS — Z9889 Other specified postprocedural states: Secondary | ICD-10-CM | POA: Insufficient documentation

## 2017-09-20 DIAGNOSIS — Z886 Allergy status to analgesic agent status: Secondary | ICD-10-CM | POA: Insufficient documentation

## 2017-09-20 DIAGNOSIS — G44039 Episodic paroxysmal hemicrania, not intractable: Secondary | ICD-10-CM | POA: Diagnosis not present

## 2017-09-20 DIAGNOSIS — M1990 Unspecified osteoarthritis, unspecified site: Secondary | ICD-10-CM | POA: Insufficient documentation

## 2017-09-20 DIAGNOSIS — Z7984 Long term (current) use of oral hypoglycemic drugs: Secondary | ICD-10-CM | POA: Diagnosis not present

## 2017-09-20 DIAGNOSIS — Z7982 Long term (current) use of aspirin: Secondary | ICD-10-CM | POA: Insufficient documentation

## 2017-09-20 DIAGNOSIS — J449 Chronic obstructive pulmonary disease, unspecified: Secondary | ICD-10-CM | POA: Diagnosis not present

## 2017-09-20 DIAGNOSIS — Z880 Allergy status to penicillin: Secondary | ICD-10-CM | POA: Insufficient documentation

## 2017-09-20 DIAGNOSIS — F17210 Nicotine dependence, cigarettes, uncomplicated: Secondary | ICD-10-CM | POA: Diagnosis not present

## 2017-09-20 DIAGNOSIS — R040 Epistaxis: Secondary | ICD-10-CM | POA: Diagnosis not present

## 2017-09-20 DIAGNOSIS — M545 Low back pain: Secondary | ICD-10-CM | POA: Insufficient documentation

## 2017-09-20 DIAGNOSIS — E119 Type 2 diabetes mellitus without complications: Secondary | ICD-10-CM | POA: Insufficient documentation

## 2017-09-20 DIAGNOSIS — M170 Bilateral primary osteoarthritis of knee: Secondary | ICD-10-CM | POA: Diagnosis not present

## 2017-09-20 DIAGNOSIS — K219 Gastro-esophageal reflux disease without esophagitis: Secondary | ICD-10-CM | POA: Diagnosis not present

## 2017-09-20 DIAGNOSIS — Z79899 Other long term (current) drug therapy: Secondary | ICD-10-CM | POA: Insufficient documentation

## 2017-09-20 DIAGNOSIS — I10 Essential (primary) hypertension: Secondary | ICD-10-CM | POA: Diagnosis not present

## 2017-09-20 DIAGNOSIS — E785 Hyperlipidemia, unspecified: Secondary | ICD-10-CM | POA: Diagnosis not present

## 2017-09-20 MED ORDER — RANITIDINE HCL 150 MG PO TABS: 150.0000 mg | ORAL_TABLET | Freq: Two times a day (BID) | ORAL | 2 refills | Status: DC

## 2017-09-20 MED ORDER — ACETAMINOPHEN 500 MG PO TABS: 500.0000 mg | ORAL_TABLET | Freq: Four times a day (QID) | ORAL | 0 refills | Status: DC | PRN

## 2017-09-20 MED ORDER — MELOXICAM 15 MG PO TABS: 15.0000 mg | ORAL_TABLET | Freq: Every day | ORAL | 5 refills | Status: DC

## 2017-09-20 MED ORDER — HYDROCHLOROTHIAZIDE 25 MG PO TABS: 25.0000 mg | ORAL_TABLET | Freq: Every day | ORAL | 0 refills | Status: DC

## 2017-09-20 NOTE — Progress Notes (Signed)
Patient ID: Sarah [name], female    DOB: April 10, 1956  MRN: [id number]  CC: Epistaxis   Subjective: Sarah [name] is a 62 y.o. female who presents for UC visit Her concerns today include:  62 year old with history of diabetes, hypertension, tobacco dependence, osteoarthritis of multiple sites, COPD, gout, and hyperlipidemia.  C/o intermittent epistaxis from LT side x 3 wks.  Had to call ambulance 3 wks ago because it was tripping moderately heavy.  Resolved with having her pinch her nose and tilt head back. Lasted about 6-7 mins.  It was not seen in the ER.  Since then she has had intermittent episodes with the last episode occurring yesterday. -She reports white creamy mucus from the right nostrils.  Not malodorous.  Denies any nasal congestion.  No pressure behind the maxillary or frontal sinuses. -Also reports headache since yesterday.  Episodes last only a few seconds and feels like a stabbing jolt.  Sometimes she feels it on the left forehead, left parietal area and crown of the head.  No blurred vision with correction lenses.  No N/V  She has blood work ordered in the system since September for CBC, CMP and lipids.  She will have them done today while she is here.  Needing refills on several medications including meloxicam and her blood pressure medicine. Patient Active Problem List   Diagnosis Date Noted  . Lipoma 01/10/2017  . Gum disease 01/10/2017  . DM neuropathy, type II diabetes mellitus ([place]) 12/12/2015  . GERD (gastroesophageal reflux disease) 03/02/2015  . Abdominal wall hernia 01/05/2013  . Diabetes type 2, controlled ([place])   . HTN (hypertension) 06/27/2011  . TOBACCO ABUSE 02/05/2007  . CONSTIPATION NOS 02/05/2007  . OSTEOARTHROSIS, GENERALIZED, MULTIPLE SITES 02/05/2007  . LOW BACK PAIN 02/05/2007     Current Outpatient Medications on File Prior to Visit  Medication Sig Dispense Refill  . albuterol (PROAIR HFA) 108 (90 Base) MCG/ACT inhaler INHALE 2 PUFFS BY MOUTH  EVERY 4 HOURS AS NEEDED FOR WHEEZING OR SHORTNESS OF BREATH 8.5 g 1  . allopurinol (ZYLOPRIM) 100 MG tablet take 1 tablet by mouth once daily 90 tablet 0  . amLODipine (NORVASC) 10 MG tablet Take 1 tablet (10 mg total) daily by mouth. 90 tablet 0  . amLODipine (NORVASC) 10 MG tablet take 1 tablet by mouth once daily 90 tablet 0  . aspirin EC 81 MG tablet Take 1 tablet (81 mg total) by mouth daily. 90 tablet 3  . blood glucose meter kit and supplies KIT Libra Freestyle meter 1 each 0  . budesonide-formoterol (SYMBICORT) 160-4.5 MCG/ACT inhaler Inhale 2 puffs into the lungs 2 (two) times daily. 1 Inhaler 2  . glimepiride (AMARYL) 1 MG tablet Take 1 tablet (1 mg total) by mouth daily with breakfast. 30 tablet 0  . ipratropium (ATROVENT HFA) 17 MCG/ACT inhaler Inhale 2 puffs into the lungs every 6 (six) hours as needed for wheezing. (Patient not taking: Reported on 07/02/2016) 1 Inhaler 12  . lisinopril (PRINIVIL,ZESTRIL) 5 MG tablet Take 1 tablet (5 mg total) by mouth every morning. 90 tablet 0  . magnesium hydroxide (MILK OF MAGNESIA) 400 MG/5ML suspension Take 30 mLs by mouth daily as needed for mild constipation.    . metFORMIN (GLUCOPHAGE) 500 MG tablet take 1 tablet by mouth once daily with BREAKFAST 90 tablet 0  . nicotine (NICODERM CQ) 21 mg/24hr patch Place 1 patch (21 mg total) onto the skin daily. 28 patch 0  . nicotine polacrilex (NICORETTE) 4  MG gum Take 1 each (4 mg total) by mouth as needed for smoking cessation. 100 tablet 0  . olopatadine (PATANOL) 0.1 % ophthalmic solution Place 1 drop into both eyes daily as needed for allergies (itchiness).     . polyethylene glycol (MIRALAX / GLYCOLAX) packet Take 17 g by mouth daily as needed for mild constipation (Pt takes 3-4x/week).     . pravastatin (PRAVACHOL) 20 MG tablet Take 1 tablet (20 mg total) by mouth daily. 90 tablet 3  . sodium chloride (OCEAN) 0.65 % SOLN nasal spray Place 2 sprays into both nostrils as needed for congestion. 1  Bottle 11   No current facility-administered medications on file prior to visit.     Allergies  Allergen Reactions  . Aspirin Other (See Comments)    Causes her stomach pain.  . Ibuprofen Other (See Comments)    Causes stomach pain  . Penicillins Other (See Comments)    Localized whelps and redness after injection Has patient had a PCN reaction causing immediate rash, facial/tongue/throat swelling, SOB or lightheadedness with hypotension: No Has patient had a PCN reaction causing severe rash involving mucus membranes or skin necrosis: Yes Has patient had a PCN reaction that required hospitalization No Has patient had a PCN reaction occurring within the last 10 years: No If all of the above answers are "NO", then may proceed with Cephalosporin use.     Social History   Socioeconomic History  . Marital status: Married    Spouse name: Not on file  . Number of children: Not on file  . Years of education: Not on file  . Highest education level: Not on file  Occupational History  . Not on file  Social Needs  . Financial resource strain: Not on file  . Food insecurity:    Worry: Not on file    Inability: Not on file  . Transportation needs:    Medical: Not on file    Non-medical: Not on file  Tobacco Use  . Smoking status: Current Every Day Smoker    Packs/day: 1.50    Years: 40.00    Pack years: 60.00    Types: Cigarettes  . Smokeless tobacco: Never Used  Substance and Sexual Activity  . Alcohol use: No    Comment: hx alcohol abuse- drank >fifth of gin daily/  states Quit 2002  . Drug use: No  . Sexual activity: Not on file  Lifestyle  . Physical activity:    Days per week: Not on file    Minutes per session: Not on file  . Stress: Not on file  Relationships  . Social connections:    Talks on phone: Not on file    Gets together: Not on file    Attends religious service: Not on file    Active member of club or organization: Not on file    Attends meetings of  clubs or organizations: Not on file    Relationship status: Not on file  . Intimate partner violence:    Fear of current or ex partner: Not on file    Emotionally abused: Not on file    Physically abused: Not on file    Forced sexual activity: Not on file  Other Topics Concern  . Not on file  Social History Narrative  . Not on file    No family history on file.  Past Surgical History:  Procedure Laterality Date  . DILATION AND CURETTAGE OF UTERUS    . INSERTION OF  MESH N/A 07/20/2014   Procedure: INSERTION OF MESH;  Surgeon: Fanny Skates, MD;  Location: [facility];  Service: General;  Laterality: N/A;  . KNEE ARTHROSCOPY Left 2000  . TOTAL ABDOMINAL HYSTERECTOMY W/ BILATERAL SALPINGOOPHORECTOMY  1995  . TRANSTHORACIC ECHOCARDIOGRAM  07-22-2014   mild LVH,  60-65%,  grade I diastolic dysfunction/  mild LAE  . VENTRAL HERNIA REPAIR N/A 07/20/2014   Procedure: LAPAROSCOPIC REPAIR INCARCARATED VENTRAL HERNIA AND UMBILICAL HERNIA;  Surgeon: Fanny Skates, MD;  Location: [facility];  Service: General;  Laterality: N/A;    ROS: Review of Systems Neg except as above PHYSICAL EXAM: BP 115/76   Pulse 71   Temp 97.9 F (36.6 C) (Oral)   Resp 16   Wt 222 lb 12.8 oz (101.1 kg)   SpO2 96%   BMI 33.88 kg/m   Physical Exam  General appearance - alert, well appearing, and in no distress Mental status - alert, oriented to person, place, and time, normal mood, behavior, speech, dress, motor activity, and thought processes Nose -small amount of dried blood in left nostril.  Mild enlargement of the nasal turbinates bilaterally.   Mouth - mucous membranes moist, pharynx normal without lesions Neurological - cranial nerves II through XII intact, motor and sensory grossly normal bilaterally, Romberg sign negative, normal gait and station  ASSESSMENT AND PLAN: 1. Epistaxis Advised patient to pinch his nose until the head back for any recurrent episodes.  Check CBC today as previously  ordered. Recommend that she purchase Afrin nasal spray over-the-counter and put 1 spray in the nostril daily for 2 days.  Referral to ENT. - Ambulatory referral to ENT  2. Episodic paroxysmal hemicrania, not intractable -Indomethacin works best but she is on meloxicam so will use Tylenol instead. - acetaminophen (TYLENOL) 500 MG tablet; Take 1 tablet (500 mg total) by mouth every 6 (six) hours as needed for headache.  Dispense: 30 tablet; Refill: 0  3. Osteoarthritis of both knees, unspecified osteoarthritis type Refill on meloxicam - meloxicam (MOBIC) 15 MG tablet; Take 1 tablet (15 mg total) by mouth daily.  Dispense: 30 tablet; Refill: 5  4. Essential hypertension, benign Refill HCTZ per patient request - hydrochlorothiazide (HYDRODIURIL) 25 MG tablet; Take 1 tablet (25 mg total) by mouth daily.  Dispense: 30 tablet; Refill: 0  5. Controlled type 2 diabetes mellitus without complication, without long-term current use of insulin ([place]) She will stop at lab today to have these labs done which were ordered on previous visit - Lipid panel - CBC - Comprehensive metabolic panel   Patient was given the opportunity to ask questions.  Patient verbalized understanding of the plan and was able to repeat key elements of the plan.   Orders Placed This Encounter  Procedures  . Ambulatory referral to ENT     Requested Prescriptions   Signed Prescriptions Disp Refills  . meloxicam (MOBIC) 15 MG tablet 30 tablet 5    Sig: Take 1 tablet (15 mg total) by mouth daily.  . hydrochlorothiazide (HYDRODIURIL) 25 MG tablet 30 tablet 0    Sig: Take 1 tablet (25 mg total) by mouth daily.  . ranitidine (ZANTAC) 150 MG tablet 60 tablet 2    Sig: Take 1 tablet (150 mg total) by mouth 2 (two) times daily.  [name] acetaminophen (TYLENOL) 500 MG tablet 30 tablet 0    Sig: Take 1 tablet (500 mg total) by mouth every 6 (six) hours as needed for headache.    Return in about 7 weeks (around  11/08/2017).  Karle Plumber, MD, FACP

## 2017-09-20 NOTE — Patient Instructions (Signed)
You have been referred to ENT.  Purchase and use Afrin Nasal spray over the counter.  1 spray in left nose daily for the next 2 days then stop.

## 2017-09-21 LAB — CBC
HEMATOCRIT: 39.1 % (ref 34.0–46.6)
Hemoglobin: 12.9 g/dL (ref 11.1–15.9)
MCH: 26.5 pg — ABNORMAL LOW (ref 26.6–33.0)
MCHC: 33 g/dL (ref 31.5–35.7)
MCV: 80 fL (ref 79–97)
Platelets: 277 10*3/uL (ref 150–379)
RBC: 4.87 x10E6/uL (ref 3.77–5.28)
RDW: 15.1 % (ref 12.3–15.4)
WBC: 5.1 10*3/uL (ref 3.4–10.8)

## 2017-09-21 LAB — COMPREHENSIVE METABOLIC PANEL
ALK PHOS: 100 IU/L (ref 39–117)
ALT: 11 IU/L (ref 0–32)
AST: 15 IU/L (ref 0–40)
Albumin/Globulin Ratio: 1.5 (ref 1.2–2.2)
Albumin: 4.2 g/dL (ref 3.6–4.8)
BUN / CREAT RATIO: 17 (ref 12–28)
BUN: 13 mg/dL (ref 8–27)
Bilirubin Total: 0.6 mg/dL (ref 0.0–1.2)
CALCIUM: 9.5 mg/dL (ref 8.7–10.3)
CO2: 24 mmol/L (ref 20–29)
CREATININE: 0.76 mg/dL (ref 0.57–1.00)
Chloride: 105 mmol/L (ref 96–106)
GFR, EST AFRICAN AMERICAN: 96 mL/min/{1.73_m2} (ref >59)
GFR, EST NON AFRICAN AMERICAN: 83 mL/min/{1.73_m2} (ref >59)
GLOBULIN, TOTAL: 2.8 g/dL (ref 1.5–4.5)
GLUCOSE: 89 mg/dL (ref 65–99)
Potassium: 4.1 mmol/L (ref 3.5–5.2)
Sodium: 142 mmol/L (ref 134–144)
TOTAL PROTEIN: 7 g/dL (ref 6.0–8.5)

## 2017-09-21 LAB — LIPID PANEL
CHOL/HDL RATIO: 5.1 ratio — AB (ref 0.0–4.4)
CHOLESTEROL TOTAL: 149 mg/dL (ref 100–199)
HDL: 29 mg/dL — ABNORMAL LOW (ref >39)
LDL Calculated: 100 mg/dL — ABNORMAL HIGH (ref 0–99)
TRIGLYCERIDES: 101 mg/dL (ref 0–149)
VLDL Cholesterol Cal: 20 mg/dL (ref 5–40)

## 2017-09-23 ENCOUNTER — Telehealth: Payer: Self-pay

## 2017-09-23 NOTE — Telephone Encounter (Signed)
Contacted pt to go over lab results pt is aware and doesn't have any questions or concerns 

## 2017-10-01 ENCOUNTER — Ambulatory Visit: Payer: Medicare Other | Admitting: Internal Medicine

## 2017-10-18 ENCOUNTER — Other Ambulatory Visit: Payer: Self-pay | Admitting: *Deleted

## 2017-10-18 DIAGNOSIS — I10 Essential (primary) hypertension: Secondary | ICD-10-CM

## 2017-10-18 MED ORDER — HYDROCHLOROTHIAZIDE 25 MG PO TABS: 25.0000 mg | ORAL_TABLET | Freq: Every day | ORAL | 0 refills | Status: DC

## 2017-10-28 ENCOUNTER — Other Ambulatory Visit: Payer: Self-pay

## 2017-10-28 DIAGNOSIS — I10 Essential (primary) hypertension: Secondary | ICD-10-CM

## 2017-10-28 MED ORDER — HYDROCHLOROTHIAZIDE 25 MG PO TABS
25.0000 mg | ORAL_TABLET | Freq: Every day | ORAL | 0 refills | Status: AC
Start: 2017-10-28 — End: ?

## 2017-11-05 ENCOUNTER — Encounter (HOSPITAL_COMMUNITY): Payer: Self-pay

## 2017-11-05 ENCOUNTER — Other Ambulatory Visit: Payer: Self-pay

## 2017-11-05 DIAGNOSIS — Z79899 Other long term (current) drug therapy: Secondary | ICD-10-CM | POA: Diagnosis not present

## 2017-11-05 DIAGNOSIS — E1140 Type 2 diabetes mellitus with diabetic neuropathy, unspecified: Secondary | ICD-10-CM | POA: Diagnosis not present

## 2017-11-05 DIAGNOSIS — R112 Nausea with vomiting, unspecified: Secondary | ICD-10-CM | POA: Insufficient documentation

## 2017-11-05 DIAGNOSIS — F17210 Nicotine dependence, cigarettes, uncomplicated: Secondary | ICD-10-CM | POA: Diagnosis not present

## 2017-11-05 DIAGNOSIS — Z7984 Long term (current) use of oral hypoglycemic drugs: Secondary | ICD-10-CM | POA: Diagnosis not present

## 2017-11-05 DIAGNOSIS — Z7902 Long term (current) use of antithrombotics/antiplatelets: Secondary | ICD-10-CM | POA: Insufficient documentation

## 2017-11-05 DIAGNOSIS — R109 Unspecified abdominal pain: Secondary | ICD-10-CM | POA: Diagnosis present

## 2017-11-05 DIAGNOSIS — R1013 Epigastric pain: Secondary | ICD-10-CM | POA: Diagnosis not present

## 2017-11-05 DIAGNOSIS — I10 Essential (primary) hypertension: Secondary | ICD-10-CM | POA: Insufficient documentation

## 2017-11-05 LAB — CBC
HCT: 42.5 % (ref 36.0–46.0)
HEMOGLOBIN: 14.6 g/dL (ref 12.0–15.0)
MCH: 27.2 pg (ref 26.0–34.0)
MCHC: 34.4 g/dL (ref 30.0–36.0)
MCV: 79.1 fL (ref 78.0–100.0)
Platelets: 292 10*3/uL (ref 150–400)
RBC: 5.37 MIL/uL — AB (ref 3.87–5.11)
RDW: 15.6 % — ABNORMAL HIGH (ref 11.5–15.5)
WBC: 6.8 10*3/uL (ref 4.0–10.5)

## 2017-11-05 LAB — COMPREHENSIVE METABOLIC PANEL
ALK PHOS: 104 U/L (ref 38–126)
ALT: 14 U/L (ref 14–54)
ANION GAP: 13 (ref 5–15)
AST: 16 U/L (ref 15–41)
Albumin: 4.2 g/dL (ref 3.5–5.0)
BUN: 13 mg/dL (ref 6–20)
CHLORIDE: 98 mmol/L — AB (ref 101–111)
CO2: 27 mmol/L (ref 22–32)
CREATININE: 0.82 mg/dL (ref 0.44–1.00)
Calcium: 9.7 mg/dL (ref 8.9–10.3)
GFR calc non Af Amer: >60 mL/min (ref >60)
GLUCOSE: 133 mg/dL — AB (ref 65–99)
Potassium: 3 mmol/L — ABNORMAL LOW (ref 3.5–5.1)
SODIUM: 138 mmol/L (ref 135–145)
Total Bilirubin: 0.9 mg/dL (ref 0.3–1.2)
Total Protein: 8.3 g/dL — ABNORMAL HIGH (ref 6.5–8.1)

## 2017-11-05 LAB — LIPASE, BLOOD: LIPASE: 37 U/L (ref 11–51)

## 2017-11-05 MED ORDER — ONDANSETRON 4 MG PO TBDP
4.0000 mg | ORAL_TABLET | Freq: Once | ORAL | Status: AC | PRN
  Administered 2017-11-05: 4 mg via ORAL
  Filled 2017-11-05: qty 1

## 2017-11-05 NOTE — ED Triage Notes (Signed)
Patient initially asleep when this writer walked in to the room to triage patient

## 2017-11-05 NOTE — ED Triage Notes (Signed)
Patient states abdominal pain and cramping that started yesterday. Patient states vomiting started today-has vomited multiple times today and has been dry heaving. Patient denies diarrhea.

## 2017-11-06 ENCOUNTER — Emergency Department (HOSPITAL_COMMUNITY)
Admission: EM | Admit: 2017-11-06 | Discharge: 2017-11-06 | Disposition: A | Discharge status: Home / Self Care | Payer: Medicare Other | Attending: Emergency Medicine | Admitting: Emergency Medicine

## 2017-11-06 ENCOUNTER — Encounter (HOSPITAL_COMMUNITY): Payer: Self-pay

## 2017-11-06 ENCOUNTER — Emergency Department (HOSPITAL_COMMUNITY): Payer: Medicare Other

## 2017-11-06 DIAGNOSIS — R112 Nausea with vomiting, unspecified: Secondary | ICD-10-CM

## 2017-11-06 DIAGNOSIS — R1013 Epigastric pain: Secondary | ICD-10-CM

## 2017-11-06 LAB — URINALYSIS, ROUTINE W REFLEX MICROSCOPIC
Bilirubin Urine: NEGATIVE
Glucose, UA: NEGATIVE mg/dL
Hgb urine dipstick: NEGATIVE
KETONES UR: NEGATIVE mg/dL
LEUKOCYTES UA: NEGATIVE
NITRITE: NEGATIVE
PH: 5 (ref 5.0–8.0)
PROTEIN: NEGATIVE mg/dL
Specific Gravity, Urine: 1.023 (ref 1.005–1.030)

## 2017-11-06 MED ORDER — IOPAMIDOL (ISOVUE-300) INJECTION 61%
INTRAVENOUS | Status: AC
  Filled 2017-11-06: qty 100

## 2017-11-06 MED ORDER — SODIUM CHLORIDE 0.9 % IV BOLUS
1000.0000 mL | Freq: Once | INTRAVENOUS | Status: AC
  Administered 2017-11-06: 1000 mL via INTRAVENOUS

## 2017-11-06 MED ORDER — IOPAMIDOL (ISOVUE-300) INJECTION 61%
100.0000 mL | Freq: Once | INTRAVENOUS | Status: AC | PRN
  Administered 2017-11-06: 100 mL via INTRAVENOUS

## 2017-11-06 MED ORDER — MORPHINE SULFATE (PF) 4 MG/ML IV SOLN
4.0000 mg | Freq: Once | INTRAVENOUS | Status: AC
  Administered 2017-11-06: 4 mg via INTRAVENOUS
  Filled 2017-11-06: qty 1

## 2017-11-06 MED ORDER — ONDANSETRON HCL 4 MG/2ML IJ SOLN
4.0000 mg | Freq: Once | INTRAMUSCULAR | Status: AC
  Administered 2017-11-06: 4 mg via INTRAVENOUS
  Filled 2017-11-06: qty 2

## 2017-11-06 MED ORDER — ONDANSETRON 4 MG PO TBDP: 4.0000 mg | ORAL_TABLET | Freq: Three times a day (TID) | ORAL | 0 refills | Status: DC | PRN

## 2017-11-06 NOTE — ED Notes (Signed)
Pt sitting on edge of bed when writer walked into room. Writer asked pt if they were able to provide a urine sample, pt got up and came to end of bed but then "fell" back on bed complaining of agonizing pain. Pt now unable to provide urine sample due to stomach pain.

## 2017-11-06 NOTE — ED Notes (Signed)
Pt is refusing V/S and IV administration. Spoke with MD.

## 2017-11-06 NOTE — ED Notes (Signed)
When this RN went to discharge pt, pt stated that her husband has dementia and cannot drive. Pt's son is too young to drive. Pt states she will sleep for a bit here before leaving. Pt signed d/c paperwork.

## 2017-11-06 NOTE — ED Notes (Signed)
Pt is aware a urine specimen is needed.  

## 2017-11-06 NOTE — Discharge Instructions (Signed)
You were seen today for abdominal pain and vomiting.  Your CT scan is reassuring.  It is very important that you stay hydrated.  Take Zofran as needed.

## 2017-11-06 NOTE — ED Notes (Signed)
Pt agreed to have IV placed for CT. Agreed to medication after discussion. Pt agreed to V/S after pain medication administration.

## 2017-11-06 NOTE — ED Notes (Signed)
Pt ambulated from bed to wheel-chair, got up at bathroom from wheel-chair to use bathroom; complained of abdominal pain but tolerated well.

## 2017-11-06 NOTE — ED Provider Notes (Signed)
Sarah [name] DEPT Provider Note   CSN: [id number] Arrival date & time: 11/05/17  2112     History   Chief Complaint Chief Complaint  Patient presents with  . Abdominal Pain    HPI Sarah [name] is a 62 y.o. female.  HPI  This is a 62 year old female with a history of pancreatitis, adrenal adenoma, gastritis, hypertension, diabetes who presents with abdominal pain.  Patient reports crampy abdominal pain that is nonradiating.  She reports multiple episodes of nonbilious, nonbloody emesis.  Worsened after eating.  She denies any diarrhea or constipation.  She rates her pain at 8 out of 10.  No known sick contacts.  Denies fevers, urinary symptoms, chest pain, shortness of breath.  She has not taken anything for her symptoms.  Past Medical History:  Diagnosis Date  . Acute pancreatitis    01-13-2015 per ED note  . Adrenal adenoma    bilateral  . Arthritis   . Bilateral lower extremity edema   . Complication of anesthesia    post-op 07-20-2014 surgery post-op Hypoxia due to acute CHF/ mild Pulmonary edema  . COPD (chronic obstructive pulmonary disease) ([place])   . Gastritis    01-13-2015  per ED note  . GERD (gastroesophageal reflux disease)   . Headache   . History of acute congestive heart failure    07-22-2014   post-op surgery 07-20-2014  . History of acute pancreatitis    01-04-2013   . History of alcohol abuse    state quit 2002/  drank daily >fifth of gin  . History of DVT of lower extremity    2000  post op left knee surgery  . History of pulmonary edema    mild -  07-22-2014  post-op  surgery 07-20-2014  . Hypertension   . Type 2 diabetes mellitus ([place])   . Ulnar nerve compression    right elbow    Patient Active Problem List   Diagnosis Date Noted  . Lipoma 01/10/2017  . Gum disease 01/10/2017  . DM neuropathy, type II diabetes mellitus ([place]) 12/12/2015  . GERD (gastroesophageal reflux disease) 03/02/2015  . Abdominal wall  hernia 01/05/2013  . Diabetes type 2, controlled ([place])   . HTN (hypertension) 06/27/2011  . TOBACCO ABUSE 02/05/2007  . CONSTIPATION NOS 02/05/2007  . OSTEOARTHROSIS, GENERALIZED, MULTIPLE SITES 02/05/2007  . LOW BACK PAIN 02/05/2007    Past Surgical History:  Procedure Laterality Date  . DILATION AND CURETTAGE OF UTERUS    . INSERTION OF MESH N/A 07/20/2014   Procedure: INSERTION OF MESH;  Surgeon: Fanny Skates, MD;  Location: [facility];  Service: General;  Laterality: N/A;  . KNEE ARTHROSCOPY Left 2000  . TOTAL ABDOMINAL HYSTERECTOMY W/ BILATERAL SALPINGOOPHORECTOMY  1995  . TRANSTHORACIC ECHOCARDIOGRAM  07-22-2014   mild LVH,  60-65%,  grade I diastolic dysfunction/  mild LAE  . VENTRAL HERNIA REPAIR N/A 07/20/2014   Procedure: LAPAROSCOPIC REPAIR INCARCARATED VENTRAL HERNIA AND UMBILICAL HERNIA;  Surgeon: Fanny Skates, MD;  Location: [facility];  Service: General;  Laterality: N/A;     OB History   None      Home Medications    Prior to Admission medications   Medication Sig Start Date End Date Taking? Authorizing Provider  allopurinol (ZYLOPRIM) 100 MG tablet take 1 tablet by mouth once daily 05/17/17  Yes Ladell Pier, MD  aspirin EC 81 MG tablet Take 1 tablet (81 mg total) by mouth daily. 07/02/16  Yes Maren Reamer, MD  glimepiride (AMARYL) 1 MG tablet Take 1 tablet (1 mg total) by mouth daily with breakfast. 06/28/17  Yes Ladell Pier, MD  hydrochlorothiazide (HYDRODIURIL) 25 MG tablet Take 1 tablet (25 mg total) by mouth daily. 10/28/17  Yes Ladell Pier, MD  lisinopril (PRINIVIL,ZESTRIL) 5 MG tablet Take 1 tablet (5 mg total) by mouth every morning. 08/12/17  Yes Ladell Pier, MD  metFORMIN (GLUCOPHAGE) 500 MG tablet take 1 tablet by mouth once daily with BREAKFAST 08/22/17  Yes Ladell Pier, MD  pravastatin (PRAVACHOL) 20 MG tablet Take 1 tablet (20 mg total) by mouth daily. 08/12/17  Yes Ladell Pier, MD  ranitidine (ZANTAC) 150 MG tablet  Take 1 tablet (150 mg total) by mouth 2 (two) times daily. Patient taking differently: Take 150 mg by mouth daily.  09/20/17  Yes Ladell Pier, MD  acetaminophen (TYLENOL) 500 MG tablet Take 1 tablet (500 mg total) by mouth every 6 (six) hours as needed for headache. 09/20/17   Ladell Pier, MD  albuterol (PROAIR HFA) 108 (90 Base) MCG/ACT inhaler INHALE 2 PUFFS BY MOUTH EVERY 4 HOURS AS NEEDED FOR WHEEZING OR SHORTNESS OF BREATH 01/14/17   Ladell Pier, MD  amLODipine (NORVASC) 10 MG tablet Take 1 tablet (10 mg total) daily by mouth. 04/29/17   Ladell Pier, MD  amLODipine (NORVASC) 10 MG tablet take 1 tablet by mouth once daily 05/17/17   Ladell Pier, MD  blood glucose meter kit and supplies KIT Libra Freestyle meter 03/14/17   Ladell Pier, MD  budesonide-formoterol (SYMBICORT) 160-4.5 MCG/ACT inhaler Inhale 2 puffs into the lungs 2 (two) times daily. 01/14/17   Ladell Pier, MD  ipratropium (ATROVENT HFA) 17 MCG/ACT inhaler Inhale 2 puffs into the lungs every 6 (six) hours as needed for wheezing. Patient not taking: Reported on 07/02/2016 06/30/15   Boykin Nearing, MD  latanoprost (XALATAN) 0.005 % ophthalmic solution Place 1 drop into both eyes at bedtime. 08/26/17   [provider]  magnesium hydroxide (MILK OF MAGNESIA) 400 MG/5ML suspension Take 30 mLs by mouth daily as needed for mild constipation.    [provider]  meloxicam (MOBIC) 15 MG tablet Take 1 tablet (15 mg total) by mouth daily. 09/20/17   Ladell Pier, MD  nicotine (NICODERM CQ) 21 mg/24hr patch Place 1 patch (21 mg total) onto the skin daily. 07/02/16   Maren Reamer, MD  nicotine polacrilex (NICORETTE) 4 MG gum Take 1 each (4 mg total) by mouth as needed for smoking cessation. 07/02/16   Langeland, Leda Quail, MD  olopatadine (PATANOL) 0.1 % ophthalmic solution Place 1 drop into both eyes daily as needed for allergies (itchiness).  04/05/15   [provider]    ondansetron (ZOFRAN ODT) 4 MG disintegrating tablet Take 1 tablet (4 mg total) by mouth every 8 (eight) hours as needed for nausea or vomiting. 11/06/17   Horton, Barbette Hair, MD  polyethylene glycol (MIRALAX / GLYCOLAX) packet Take 17 g by mouth daily as needed for mild constipation (Pt takes 3-4x/week).     [provider]  sodium chloride (OCEAN) 0.65 % SOLN nasal spray Place 2 sprays into both nostrils as needed for congestion. 07/02/16   Maren Reamer, MD    Family History No family history on file.  Social History Social History   Tobacco Use  . Smoking status: Current Every Day Smoker    Packs/day: 1.50    Years: 40.00  Pack years: 60.00    Types: Cigarettes  . Smokeless tobacco: Never Used  Substance Use Topics  . Alcohol use: No    Comment: hx alcohol abuse- drank >fifth of gin daily/  states Quit 2002  . Drug use: No     Allergies   Aspirin; Ibuprofen; and Penicillins   Review of Systems Review of Systems  Constitutional: Negative for fever.  Respiratory: Negative for shortness of breath.   Cardiovascular: Negative for chest pain.  Gastrointestinal: Positive for abdominal pain, nausea and vomiting. Negative for constipation and diarrhea.  Genitourinary: Negative for dysuria and hematuria.  All other systems reviewed and are negative.    Physical Exam Updated Vital Signs BP 109/74   Pulse (!) 102   Temp 98.6 F (37 C) (Oral)   Resp 16   Ht 5' 8"  (1.727 m)   Wt 97.5 kg (215 lb)   SpO2 100%   BMI 32.69 kg/m   Physical Exam  Constitutional: She is oriented to person, place, and time. She appears well-developed and well-nourished.  Toxic appearing, no acute distress, resting upon my arrival  HENT:  Head: Normocephalic and atraumatic.  Neck: Neck supple.  Cardiovascular: Normal rate, regular rhythm and normal heart sounds.  Pulmonary/Chest: Effort normal and breath sounds normal. No respiratory distress. She has no wheezes.  Abdominal:  Soft. Normal appearance and bowel sounds are normal. There is no tenderness.  Neurological: She is alert and oriented to person, place, and time.  Skin: Skin is warm and dry.  Psychiatric: She has a normal mood and affect.  Nursing note and vitals reviewed.    ED Treatments / Results  Labs (all labs ordered are listed, but only abnormal results are displayed) Labs Reviewed  COMPREHENSIVE METABOLIC PANEL - Abnormal; Notable for the following components:      Result Value   Potassium 3.0 (*)    Chloride 98 (*)    Glucose, Bld 133 (*)    Total Protein 8.3 (*)    All other components within normal limits  CBC - Abnormal; Notable for the following components:   RBC 5.37 (*)    RDW 15.6 (*)    All other components within normal limits  URINALYSIS, ROUTINE W REFLEX MICROSCOPIC - Abnormal; Notable for the following components:   APPearance HAZY (*)    All other components within normal limits  LIPASE, BLOOD    EKG None  Radiology Ct Abdomen Pelvis W Contrast  Result Date: 11/06/2017 CLINICAL DATA:  Abdominal pain and cramping for 2 days EXAM: CT ABDOMEN AND PELVIS WITH CONTRAST TECHNIQUE: Multidetector CT imaging of the abdomen and pelvis was performed using the standard protocol following bolus administration of intravenous contrast. CONTRAST:  176m ISOVUE-300 IOPAMIDOL (ISOVUE-300) INJECTION 61% COMPARISON:  06/02/2014 FINDINGS: Lower chest: Mild bibasilar atelectatic changes are noted. A few small nodular densities are noted stable from the prior exam. Hepatobiliary: Diffuse decreased attenuation is noted within the liver consistent with fatty infiltration. The gallbladder is within normal limits. Pancreas: Unremarkable. No pancreatic ductal dilatation or surrounding inflammatory changes. Spleen: Normal in size without focal abnormality. Adrenals/Urinary Tract: The adrenal glands again demonstrate bilateral lesions similar to that seen on the prior exam. These most likely represent  adenomas given their long-term stability. The kidneys show a normal enhancement pattern with the exception of a few small cysts. The bladder is well distended. Stomach/Bowel: Stomach is within normal limits. Appendix appears normal. No evidence of bowel wall thickening, distention, or inflammatory changes. Vascular/Lymphatic: Aortic atherosclerosis. No  enlarged abdominal or pelvic lymph nodes. Reproductive: Status post hysterectomy. No adnexal masses. Other: Small fat containing umbilical hernia is noted. Previously seen supraumbilical hernia has been repaired although a small recurrent hernia is noted anterior to the area of surgical mesh. Musculoskeletal: Degenerative changes of lumbar spine are noted. IMPRESSION: Small recurrent fat containing umbilical hernia. The fat arises anterior to the surgically placed mesh. Chronic changes as described above stable from the prior exam. Electronically Signed   By: Inez Catalina M.D.   On: 11/06/2017 07:17    Procedures Procedures (including critical care time)  Medications Ordered in ED Medications  iopamidol (ISOVUE-300) 61 % injection (has no administration in time range)  ondansetron (ZOFRAN-ODT) disintegrating tablet 4 mg (4 mg Oral Given 11/05/17 2253)  ondansetron (ZOFRAN) injection 4 mg (4 mg Intravenous Given 11/06/17 0451)  morphine 4 MG/ML injection 4 mg (4 mg Intravenous Given 11/06/17 0452)  sodium chloride 0.9 % bolus 1,000 mL (0 mLs Intravenous Stopped 11/06/17 0521)  iopamidol (ISOVUE-300) 61 % injection 100 mL (100 mLs Intravenous Contrast Given 11/06/17 0649)  morphine 4 MG/ML injection 4 mg (4 mg Intravenous Given 11/06/17 0714)     Initial Impression / Assessment and Plan / ED Course  I have reviewed the triage vital signs and the nursing notes.  Pertinent labs & imaging results that were available during my care of the patient were reviewed by me and considered in my medical decision making (see chart for details).     Patient presents  with abdominal pain and vomiting.  Onset 1 day ago.  She is overall nontoxic-appearing.  She does have some mild tenderness on exam without rebound or guarding.  She does have any history of abdominal hysterectomy.  Bowel obstruction would be consideration.  Additionally gastritis, gastroenteritis, and keratitis.  Lab work-up is largely reassuring.  However, patient has ongoing pain and vomiting.  CT scan was obtained.  No evidence of obstruction.  She has some fat-containing hernias.  On recheck, she states she feels better.  Will discharge home with supportive measures.  She is able to tolerate fluids prior to discharge.  After history, exam, and medical workup I feel the patient has been appropriately medically screened and is safe for discharge home. Pertinent diagnoses were discussed with the patient. Patient was given return precautions.   Final Clinical Impressions(s) / ED Diagnoses   Final diagnoses:  Epigastric pain  Non-intractable vomiting with nausea, unspecified vomiting type    ED Discharge Orders        Ordered    ondansetron (ZOFRAN ODT) 4 MG disintegrating tablet  Every 8 hours PRN     11/06/17 0736       Merryl Hacker, MD 11/06/17 [phone]

## 2017-11-08 ENCOUNTER — Encounter: Payer: Self-pay | Admitting: Internal Medicine

## 2017-11-08 ENCOUNTER — Ambulatory Visit: Payer: Medicare Other | Attending: Internal Medicine | Admitting: Internal Medicine

## 2017-11-08 VITALS — BP 128/83 | HR 81 | Temp 97.9°F | Resp 16 | Wt 215.4 lb

## 2017-11-08 DIAGNOSIS — R252 Cramp and spasm: Secondary | ICD-10-CM | POA: Diagnosis not present

## 2017-11-08 DIAGNOSIS — Z7984 Long term (current) use of oral hypoglycemic drugs: Secondary | ICD-10-CM | POA: Diagnosis not present

## 2017-11-08 DIAGNOSIS — E876 Hypokalemia: Secondary | ICD-10-CM | POA: Diagnosis not present

## 2017-11-08 DIAGNOSIS — D3500 Benign neoplasm of unspecified adrenal gland: Secondary | ICD-10-CM | POA: Insufficient documentation

## 2017-11-08 DIAGNOSIS — E1140 Type 2 diabetes mellitus with diabetic neuropathy, unspecified: Secondary | ICD-10-CM | POA: Insufficient documentation

## 2017-11-08 DIAGNOSIS — F17210 Nicotine dependence, cigarettes, uncomplicated: Secondary | ICD-10-CM | POA: Diagnosis not present

## 2017-11-08 DIAGNOSIS — F17200 Nicotine dependence, unspecified, uncomplicated: Secondary | ICD-10-CM | POA: Diagnosis not present

## 2017-11-08 DIAGNOSIS — Z7982 Long term (current) use of aspirin: Secondary | ICD-10-CM | POA: Diagnosis not present

## 2017-11-08 DIAGNOSIS — R109 Unspecified abdominal pain: Secondary | ICD-10-CM | POA: Diagnosis not present

## 2017-11-08 DIAGNOSIS — K219 Gastro-esophageal reflux disease without esophagitis: Secondary | ICD-10-CM | POA: Insufficient documentation

## 2017-11-08 DIAGNOSIS — Z79899 Other long term (current) drug therapy: Secondary | ICD-10-CM | POA: Insufficient documentation

## 2017-11-08 DIAGNOSIS — M109 Gout, unspecified: Secondary | ICD-10-CM | POA: Insufficient documentation

## 2017-11-08 DIAGNOSIS — J449 Chronic obstructive pulmonary disease, unspecified: Secondary | ICD-10-CM | POA: Diagnosis not present

## 2017-11-08 DIAGNOSIS — I10 Essential (primary) hypertension: Secondary | ICD-10-CM | POA: Diagnosis not present

## 2017-11-08 DIAGNOSIS — E785 Hyperlipidemia, unspecified: Secondary | ICD-10-CM | POA: Insufficient documentation

## 2017-11-08 DIAGNOSIS — M1990 Unspecified osteoarthritis, unspecified site: Secondary | ICD-10-CM | POA: Diagnosis not present

## 2017-11-08 DIAGNOSIS — E119 Type 2 diabetes mellitus without complications: Secondary | ICD-10-CM

## 2017-11-08 LAB — GLUCOSE, POCT (MANUAL RESULT ENTRY): POC Glucose: 126 mg/dl — AB (ref 70–99)

## 2017-11-08 MED ORDER — AMLODIPINE BESYLATE 10 MG PO TABS: 10.0000 mg | ORAL_TABLET | Freq: Every day | ORAL | 1 refills | Status: DC

## 2017-11-08 NOTE — Patient Instructions (Signed)
Use over the counter Pepto Bismol for abdominal cramps.  Your potassium level is low.  If you continue to have cramps, return to the lab to have your level check.  Pravastatin can cause leg cramps.  Trying taking the medication every other day.

## 2017-11-08 NOTE — Progress Notes (Signed)
Patient ID: Sarah [name], female    DOB: 08-Mar-1956  MRN: [id number]  CC: Hospitalization Follow-up   Subjective: Sarah [name] is a 62 y.o. female who presents for ER follow-up visit.  Her spouse and infant grandchild with her. Her concerns today include:  62 year old with history of diabetes, hypertension, OA of multiple sites, COPD, tob dep, gout, and hyperlipidemia.  Seen in ER 2 days ago with cramping abdominal pain and vomiting.  No diarrhea.  No initiating factors.  CT scan of the abdomen revealed no acute findings.  She had a small amount of fat protruding through mesh of previous umbilical hernia but no bowel obstruction.  Today she reports that she has not had any further vomiting.  She was given a prescription for Zofran but has not picked it up yet.  She is still having a little cramping in the abdomen.Starts in RUQ and spreads across abdomen and into legs.  Review of labs from the ED revealed a low potassium of 3.0.  Complains of cramps in legs and feet x 1 mth.  Occurs when laying down.  Gets better by walking it out. Use to be on muscle relaxant in past which helped.  However she has not had to use it in a while.  He is on Pravachol.  Needs RF Norvasc  She continues to smoke.  She has nicotine gum and patches at home.  Not ready to quit.  Patient Active Problem List   Diagnosis Date Noted  . Adrenal adenoma 11/08/2017  . Lipoma 01/10/2017  . Gum disease 01/10/2017  . DM neuropathy, type II diabetes mellitus ([place]) 12/12/2015  . GERD (gastroesophageal reflux disease) 03/02/2015  . Abdominal wall hernia 01/05/2013  . Diabetes type 2, controlled ([place])   . HTN (hypertension) 06/27/2011  . TOBACCO ABUSE 02/05/2007  . CONSTIPATION NOS 02/05/2007  . OSTEOARTHROSIS, GENERALIZED, MULTIPLE SITES 02/05/2007  . LOW BACK PAIN 02/05/2007     Current Outpatient Medications on File Prior to Visit  Medication Sig Dispense Refill  . acetaminophen (TYLENOL) 500 MG tablet Take 1  tablet (500 mg total) by mouth every 6 (six) hours as needed for headache. (Patient not taking: Reported on 11/06/2017) 30 tablet 0  . albuterol (PROAIR HFA) 108 (90 Base) MCG/ACT inhaler INHALE 2 PUFFS BY MOUTH EVERY 4 HOURS AS NEEDED FOR WHEEZING OR SHORTNESS OF BREATH 8.5 g 1  . allopurinol (ZYLOPRIM) 100 MG tablet take 1 tablet by mouth once daily 90 tablet 0  . aspirin EC 81 MG tablet Take 1 tablet (81 mg total) by mouth daily. 90 tablet 3  . Aspirin-Caffeine (BAYER BACK & BODY) 500-32.5 MG TABS Take 1,000 mg by mouth daily as needed (pain).    . blood glucose meter kit and supplies KIT Libra Freestyle meter 1 each 0  . budesonide-formoterol (SYMBICORT) 160-4.5 MCG/ACT inhaler Inhale 2 puffs into the lungs 2 (two) times daily. 1 Inhaler 2  . glimepiride (AMARYL) 1 MG tablet Take 1 tablet (1 mg total) by mouth daily with breakfast. 30 tablet 0  . hydrochlorothiazide (HYDRODIURIL) 25 MG tablet Take 1 tablet (25 mg total) by mouth daily. 90 tablet 0  . ipratropium (ATROVENT HFA) 17 MCG/ACT inhaler Inhale 2 puffs into the lungs every 6 (six) hours as needed for wheezing. (Patient not taking: Reported on 07/02/2016) 1 Inhaler 12  . lisinopril (PRINIVIL,ZESTRIL) 5 MG tablet Take 1 tablet (5 mg total) by mouth every morning. 90 tablet 0  . meloxicam (MOBIC) 15 MG tablet Take  1 tablet (15 mg total) by mouth daily. 30 tablet 5  . metFORMIN (GLUCOPHAGE) 500 MG tablet take 1 tablet by mouth once daily with BREAKFAST 90 tablet 0  . ondansetron (ZOFRAN ODT) 4 MG disintegrating tablet Take 1 tablet (4 mg total) by mouth every 8 (eight) hours as needed for nausea or vomiting. 20 tablet 0  . Polyethyl Glycol-Propyl Glycol (SYSTANE OP) Place 1 drop into both eyes 3 (three) times daily.    . pravastatin (PRAVACHOL) 20 MG tablet Take 1 tablet (20 mg total) by mouth daily. 90 tablet 3  . ranitidine (ZANTAC) 150 MG tablet Take 1 tablet (150 mg total) by mouth 2 (two) times daily. (Patient taking differently: Take 150  mg by mouth daily. ) 60 tablet 2   No current facility-administered medications on file prior to visit.     Allergies  Allergen Reactions  . Aspirin Other (See Comments)    Causes her stomach pain.  . Ibuprofen Other (See Comments)    Causes stomach pain  . Penicillins Other (See Comments)    Localized whelps and redness after injection Has patient had a PCN reaction causing immediate rash, facial/tongue/throat swelling, SOB or lightheadedness with hypotension: No Has patient had a PCN reaction causing severe rash involving mucus membranes or skin necrosis: Yes Has patient had a PCN reaction that required hospitalization No Has patient had a PCN reaction occurring within the last 10 years: No If all of the above answers are "NO", then may proceed with Cephalosporin use.     Social History   Socioeconomic History  . Marital status: Married    Spouse name: Not on file  . Number of children: Not on file  . Years of education: Not on file  . Highest education level: Not on file  Occupational History  . Not on file  Social Needs  . Financial resource strain: Not on file  . Food insecurity:    Worry: Not on file    Inability: Not on file  . Transportation needs:    Medical: Not on file    Non-medical: Not on file  Tobacco Use  . Smoking status: Current Every Day Smoker    Packs/day: 1.50    Years: 40.00    Pack years: 60.00    Types: Cigarettes  . Smokeless tobacco: Never Used  Substance and Sexual Activity  . Alcohol use: No    Comment: hx alcohol abuse- drank >fifth of gin daily/  states Quit 2002  . Drug use: No  . Sexual activity: Not on file  Lifestyle  . Physical activity:    Days per week: Not on file    Minutes per session: Not on file  . Stress: Not on file  Relationships  . Social connections:    Talks on phone: Not on file    Gets together: Not on file    Attends religious service: Not on file    Active member of club or organization: Not on file     Attends meetings of clubs or organizations: Not on file    Relationship status: Not on file  . Intimate partner violence:    Fear of current or ex partner: Not on file    Emotionally abused: Not on file    Physically abused: Not on file    Forced sexual activity: Not on file  Other Topics Concern  . Not on file  Social History Narrative  . Not on file    No family history on  file.  Past Surgical History:  Procedure Laterality Date  . DILATION AND CURETTAGE OF UTERUS    . INSERTION OF MESH N/A 07/20/2014   Procedure: INSERTION OF MESH;  Surgeon: Fanny Skates, MD;  Location: [facility];  Service: General;  Laterality: N/A;  . KNEE ARTHROSCOPY Left 2000  . TOTAL ABDOMINAL HYSTERECTOMY W/ BILATERAL SALPINGOOPHORECTOMY  1995  . TRANSTHORACIC ECHOCARDIOGRAM  07-22-2014   mild LVH,  60-65%,  grade I diastolic dysfunction/  mild LAE  . VENTRAL HERNIA REPAIR N/A 07/20/2014   Procedure: LAPAROSCOPIC REPAIR INCARCARATED VENTRAL HERNIA AND UMBILICAL HERNIA;  Surgeon: Fanny Skates, MD;  Location: [facility];  Service: General;  Laterality: N/A;    ROS: Review of Systems Negative except as stated above PHYSICAL EXAM: BP 128/83   Pulse 81   Temp 97.9 F (36.6 C) (Oral)   Resp 16   Wt 215 lb 6.4 oz (97.7 kg)   SpO2 94%   BMI 32.75 kg/m   Physical Exam  General appearance - alert, well appearing, and in no distress Mental status - normal mood, behavior, speech, dress, motor activity, and thought processes Mouth - mucous membranes moist, pharynx normal without lesions Abdomen -normal bowel sounds.  Soft, nontender no organomegaly.  No guarding or rebound Extremities: No lower extremity edema Results for orders placed or performed in visit on 11/08/17  POCT glucose (manual entry)  Result Value Ref Range   POC Glucose 126 (A) 70 - 99 mg/dl     Chemistry      Component Value Date/Time   NA 138 11/05/2017 2259   NA 142 09/20/2017 1153   K 3.0 (L) 11/05/2017 2259   CL 98 (L) 11/05/2017  2259   CO2 27 11/05/2017 2259   BUN 13 11/05/2017 2259   BUN 13 09/20/2017 1153   CREATININE 0.82 11/05/2017 2259   CREATININE 0.83 07/02/2016 1022      Component Value Date/Time   CALCIUM 9.7 11/05/2017 2259   ALKPHOS 104 11/05/2017 2259   AST 16 11/05/2017 2259   ALT 14 11/05/2017 2259   BILITOT 0.9 11/05/2017 2259   BILITOT 0.6 09/20/2017 1153      ASSESSMENT AND PLAN: 1. Abdominal cramps Clinically improved.  Recommend use of Pepto-Bismol over-the-counter to help decrease abdominal cramps.  2. Controlled type 2 diabetes mellitus without complication, without long-term current use of insulin ([place]) This was not addressed on today's visit. - POCT glucose (manual entry) - Hemoglobin A1c  3. Hypokalemia Recommend repeat potassium level today but patient declined blood draw.  She has not had any further vomiting.  Advised to eat some bananas over the next few days  4. Leg cramps Likely due to statin.  Advised taking Pravachol as 20 mg half a tablet a day or a full tablet every other day to see whether the cramps decrease  5. Tobacco dependence Continue to encourage her to consider smoking cessation to decrease cardiovascular risks  6. Essential hypertension, benign - amLODipine (NORVASC) 10 MG tablet; Take 1 tablet (10 mg total) by mouth daily.  Dispense: 90 tablet; Refill: 1  Patient was given the opportunity to ask questions.  Patient verbalized understanding of the plan and was able to repeat key elements of the plan.   Orders Placed This Encounter  Procedures  . Hemoglobin A1c  . POCT glucose (manual entry)     Requested Prescriptions   Signed Prescriptions Disp Refills  . amLODipine (NORVASC) 10 MG tablet 90 tablet 1    Sig: Take 1 tablet (  10 mg total) by mouth daily.    No follow-ups on file.  Karle Plumber, MD, FACP

## 2017-11-08 NOTE — Progress Notes (Signed)
Pt states her abdominal pain is off and off

## 2017-11-14 ENCOUNTER — Ambulatory Visit: Payer: Medicare Other | Attending: Family Medicine

## 2017-11-14 DIAGNOSIS — E119 Type 2 diabetes mellitus without complications: Secondary | ICD-10-CM | POA: Diagnosis not present

## 2017-11-14 NOTE — Progress Notes (Signed)
Patient here for lab visit  

## 2017-11-15 LAB — HEMOGLOBIN A1C
ESTIMATED AVERAGE GLUCOSE: 131 mg/dL
HEMOGLOBIN A1C: 6.2 % — AB (ref 4.8–5.6)

## 2017-11-19 ENCOUNTER — Other Ambulatory Visit: Payer: Self-pay

## 2017-11-19 DIAGNOSIS — I10 Essential (primary) hypertension: Secondary | ICD-10-CM

## 2017-11-19 MED ORDER — LISINOPRIL 5 MG PO TABS: 5.0000 mg | ORAL_TABLET | Freq: Every morning | ORAL | 1 refills | Status: AC

## 2017-11-20 ENCOUNTER — Telehealth: Payer: Self-pay

## 2017-11-20 NOTE — Telephone Encounter (Signed)
Contacted pt to go over lab results pt is aware and doesn't have any questions or concerns 

## 2017-12-09 DIAGNOSIS — F17210 Nicotine dependence, cigarettes, uncomplicated: Secondary | ICD-10-CM | POA: Insufficient documentation

## 2017-12-09 DIAGNOSIS — R040 Epistaxis: Secondary | ICD-10-CM | POA: Insufficient documentation

## 2017-12-09 DIAGNOSIS — K047 Periapical abscess without sinus: Secondary | ICD-10-CM | POA: Insufficient documentation

## 2017-12-16 ENCOUNTER — Telehealth: Payer: Self-pay

## 2017-12-16 NOTE — Telephone Encounter (Signed)
error 

## 2017-12-20 ENCOUNTER — Other Ambulatory Visit: Payer: Self-pay

## 2017-12-20 ENCOUNTER — Telehealth: Payer: Self-pay | Admitting: Internal Medicine

## 2017-12-20 DIAGNOSIS — M109 Gout, unspecified: Secondary | ICD-10-CM

## 2017-12-20 DIAGNOSIS — I10 Essential (primary) hypertension: Secondary | ICD-10-CM

## 2017-12-20 NOTE — Telephone Encounter (Signed)
Pt came in to request a medication refill on -metFORMIN (GLUCOPHAGE) 500 MG tablet  -glimepiride (AMARYL) 1 MG tablet  -albuterol (PROAIR HFA) 108 (90 Base) MCG/ACT inhaler  -allopurinol (ZYLOPRIM) 100 MG tablet  -amLODipine (NORVASC) 10 MG tablet  Athletes foot medication to -[facility] [phone] - [place], [place] - [address]

## 2017-12-21 MED ORDER — AMLODIPINE BESYLATE 10 MG PO TABS: 10.0000 mg | ORAL_TABLET | Freq: Every day | ORAL | 3 refills | Status: DC

## 2017-12-21 MED ORDER — TERBINAFINE HCL 1 % EX CREA: 1.0000 "application " | TOPICAL_CREAM | Freq: Two times a day (BID) | CUTANEOUS | 1 refills | Status: DC

## 2017-12-21 MED ORDER — ALBUTEROL SULFATE HFA 108 (90 BASE) MCG/ACT IN AERS: INHALATION_SPRAY | RESPIRATORY_TRACT | 12 refills | Status: DC

## 2017-12-21 MED ORDER — ALLOPURINOL 100 MG PO TABS: 100.0000 mg | ORAL_TABLET | Freq: Every day | ORAL | 2 refills | Status: DC

## 2017-12-21 MED ORDER — GLIMEPIRIDE 1 MG PO TABS: 1.0000 mg | ORAL_TABLET | Freq: Every day | ORAL | 3 refills | Status: DC

## 2017-12-21 MED ORDER — METFORMIN HCL 500 MG PO TABS: ORAL_TABLET | ORAL | 3 refills | Status: DC

## 2017-12-21 NOTE — Telephone Encounter (Signed)
Refills sent on requested meds.

## 2018-01-30 ENCOUNTER — Telehealth: Payer: Self-pay | Admitting: Internal Medicine

## 2018-01-30 ENCOUNTER — Ambulatory Visit: Payer: Medicare Other | Attending: Family Medicine | Admitting: Physician Assistant

## 2018-01-30 VITALS — BP 123/77 | HR 80 | Temp 98.0°F | Ht 68.0 in | Wt 213.2 lb

## 2018-01-30 DIAGNOSIS — I110 Hypertensive heart disease with heart failure: Secondary | ICD-10-CM | POA: Diagnosis not present

## 2018-01-30 DIAGNOSIS — Z7951 Long term (current) use of inhaled steroids: Secondary | ICD-10-CM | POA: Insufficient documentation

## 2018-01-30 DIAGNOSIS — H6121 Impacted cerumen, right ear: Secondary | ICD-10-CM | POA: Insufficient documentation

## 2018-01-30 DIAGNOSIS — I509 Heart failure, unspecified: Secondary | ICD-10-CM | POA: Diagnosis not present

## 2018-01-30 DIAGNOSIS — I10 Essential (primary) hypertension: Secondary | ICD-10-CM | POA: Diagnosis not present

## 2018-01-30 DIAGNOSIS — Z86718 Personal history of other venous thrombosis and embolism: Secondary | ICD-10-CM | POA: Diagnosis not present

## 2018-01-30 DIAGNOSIS — Z791 Long term (current) use of non-steroidal anti-inflammatories (NSAID): Secondary | ICD-10-CM | POA: Diagnosis not present

## 2018-01-30 DIAGNOSIS — Z880 Allergy status to penicillin: Secondary | ICD-10-CM | POA: Insufficient documentation

## 2018-01-30 DIAGNOSIS — H6981 Other specified disorders of Eustachian tube, right ear: Secondary | ICD-10-CM | POA: Diagnosis not present

## 2018-01-30 DIAGNOSIS — Z79899 Other long term (current) drug therapy: Secondary | ICD-10-CM | POA: Insufficient documentation

## 2018-01-30 DIAGNOSIS — Z7984 Long term (current) use of oral hypoglycemic drugs: Secondary | ICD-10-CM | POA: Insufficient documentation

## 2018-01-30 DIAGNOSIS — E1165 Type 2 diabetes mellitus with hyperglycemia: Secondary | ICD-10-CM | POA: Insufficient documentation

## 2018-01-30 DIAGNOSIS — J449 Chronic obstructive pulmonary disease, unspecified: Secondary | ICD-10-CM | POA: Diagnosis not present

## 2018-01-30 DIAGNOSIS — Z886 Allergy status to analgesic agent status: Secondary | ICD-10-CM | POA: Diagnosis not present

## 2018-01-30 DIAGNOSIS — H9191 Unspecified hearing loss, right ear: Secondary | ICD-10-CM

## 2018-01-30 DIAGNOSIS — K219 Gastro-esophageal reflux disease without esophagitis: Secondary | ICD-10-CM | POA: Insufficient documentation

## 2018-01-30 DIAGNOSIS — Z7982 Long term (current) use of aspirin: Secondary | ICD-10-CM | POA: Diagnosis not present

## 2018-01-30 LAB — GLUCOSE, POCT (MANUAL RESULT ENTRY): POC GLUCOSE: 164 mg/dL — AB (ref 70–99)

## 2018-01-30 MED ORDER — FLUTICASONE PROPIONATE 50 MCG/ACT NA SUSP: 2.0000 | Freq: Every day | NASAL | 6 refills | Status: DC

## 2018-01-30 NOTE — Progress Notes (Signed)
Patient can not hear out of right ear.

## 2018-01-30 NOTE — Progress Notes (Signed)
Patient ID: Sarah [name], female   DOB: [birth year], 62 y.o.   MRN: [id number]   Sarah [name], is a 62 y.o. female  JOI:[id number]  OBS:[id number]  DOB - 20-Dec-1955  Subjective:  Chief Complaint and HPI: Sarah [name] is a 62 y.o. female here today for 6 days of R ear congestion, decreased hearing on the R.   ROS:   Constitutional:  No f/c, No night sweats, No unexplained weight loss. EENT:  No vision changes, No blurry vision, No hearing changes. No additional mouth, throat, or ear problems.  Respiratory: No cough, No SOB Cardiac: No CP, no palpitations GI:  No abd pain, No N/V/D. GU: No Urinary s/sx Musculoskeletal: No joint pain Neuro: No headache, no dizziness, no motor weakness.  Skin: No rash Endocrine:  No polydipsia. No polyuria.  Psych: Denies SI/HI  No problems updated.  ALLERGIES: Allergies  Allergen Reactions  . Aspirin Other (See Comments)    Causes her stomach pain.  . Ibuprofen Other (See Comments)    Causes stomach pain  . Penicillins Other (See Comments)    Localized whelps and redness after injection Has patient had a PCN reaction causing immediate rash, facial/tongue/throat swelling, SOB or lightheadedness with hypotension: No Has patient had a PCN reaction causing severe rash involving mucus membranes or skin necrosis: Yes Has patient had a PCN reaction that required hospitalization No Has patient had a PCN reaction occurring within the last 10 years: No If all of the above answers are "NO", then may proceed with Cephalosporin use.     PAST MEDICAL HISTORY: Past Medical History:  Diagnosis Date  . Acute pancreatitis    01-13-2015 per ED note  . Adrenal adenoma    bilateral  . Arthritis   . Bilateral lower extremity edema   . Complication of anesthesia    post-op 07-20-2014 surgery post-op Hypoxia due to acute CHF/ mild Pulmonary edema  . COPD (chronic obstructive pulmonary disease) ([place])   . Gastritis    01-13-2015  per ED note  . GERD  (gastroesophageal reflux disease)   . Headache   . History of acute congestive heart failure    07-22-2014   post-op surgery 07-20-2014  . History of acute pancreatitis    01-04-2013   . History of alcohol abuse    state quit 2002/  drank daily >fifth of gin  . History of DVT of lower extremity    2000  post op left knee surgery  . History of pulmonary edema    mild -  07-22-2014  post-op  surgery 07-20-2014  . Hypertension   . Type 2 diabetes mellitus ([place])   . Ulnar nerve compression    right elbow    MEDICATIONS AT HOME: Prior to Admission medications   Medication Sig Start Date End Date Taking? Authorizing Provider  albuterol (PROAIR HFA) 108 (90 Base) MCG/ACT inhaler INHALE 2 PUFFS BY MOUTH EVERY 4 HOURS AS NEEDED FOR WHEEZING OR SHORTNESS OF BREATH 12/21/17  Yes Ladell Pier, MD  allopurinol (ZYLOPRIM) 100 MG tablet Take 1 tablet (100 mg total) by mouth daily. 12/21/17  Yes Ladell Pier, MD  amLODipine (NORVASC) 10 MG tablet Take 1 tablet (10 mg total) by mouth daily. 12/21/17  Yes Ladell Pier, MD  aspirin EC 81 MG tablet Take 1 tablet (81 mg total) by mouth daily. 07/02/16  Yes Langeland, Dawn T, MD  Aspirin-Caffeine (BAYER BACK & BODY) 500-32.5 MG TABS Take 1,000 mg by mouth daily as needed (pain).  Yes [provider]  blood glucose meter kit and supplies KIT Libra Freestyle meter 03/14/17  Yes Ladell Pier, MD  budesonide-formoterol [facility]) 160-4.5 MCG/ACT inhaler Inhale 2 puffs into the lungs 2 (two) times daily. 01/14/17  Yes Ladell Pier, MD  glimepiride (AMARYL) 1 MG tablet Take 1 tablet (1 mg total) by mouth daily with breakfast. 12/21/17  Yes Ladell Pier, MD  hydrochlorothiazide (HYDRODIURIL) 25 MG tablet Take 1 tablet (25 mg total) by mouth daily. 10/28/17  Yes Ladell Pier, MD  lisinopril (PRINIVIL,ZESTRIL) 5 MG tablet Take 1 tablet (5 mg total) by mouth every morning. 11/19/17  Yes Ladell Pier, MD  meloxicam (MOBIC)  15 MG tablet Take 1 tablet (15 mg total) by mouth daily. 09/20/17  Yes Ladell Pier, MD  metFORMIN (GLUCOPHAGE) 500 MG tablet take 1 tablet by mouth once daily with BREAKFAST 12/21/17  Yes Ladell Pier, MD  ondansetron (ZOFRAN ODT) 4 MG disintegrating tablet Take 1 tablet (4 mg total) by mouth every 8 (eight) hours as needed for nausea or vomiting. 11/06/17  Yes Horton, Barbette Hair, MD  Polyethyl Glycol-Propyl Glycol (SYSTANE OP) Place 1 drop into both eyes 3 (three) times daily.   Yes [provider]  pravastatin (PRAVACHOL) 20 MG tablet Take 1 tablet (20 mg total) by mouth daily. 08/12/17  Yes Ladell Pier, MD  ranitidine (ZANTAC) 150 MG tablet Take 1 tablet (150 mg total) by mouth 2 (two) times daily. Patient taking differently: Take 150 mg by mouth daily.  09/20/17  Yes Ladell Pier, MD  terbinafine (LAMISIL AT) 1 % cream Apply 1 application topically 2 (two) times daily. 12/21/17  Yes Ladell Pier, MD  acetaminophen (TYLENOL) 500 MG tablet Take 1 tablet (500 mg total) by mouth every 6 (six) hours as needed for headache. Patient not taking: Reported on 11/06/2017 09/20/17   Ladell Pier, MD  ipratropium (ATROVENT HFA) 17 MCG/ACT inhaler Inhale 2 puffs into the lungs every 6 (six) hours as needed for wheezing. Patient not taking: Reported on 07/02/2016 06/30/15   Boykin Nearing, MD     Objective:  EXAM:   Vitals:   01/30/18 1354  BP: 123/77  Pulse: 80  Temp: 98 F (36.7 C)  TempSrc: Oral  SpO2: 100%  Weight: 213 lb 3.2 oz (96.7 kg)  Height: 5' 8"  (1.727 m)    General appearance : A&OX3. NAD. Non-toxic-appearing HEENT: Atraumatic and Normocephalic.  PERRLA. EOM intact.  L canal and TM WNL.  R canal blocked with cerumen.  Softening drops then irrigation with removal of cerumen.  R TM full without infection.   Mouth-MMM, post pharynx WNL w/o erythema, No PND. Neck: supple, no JVD. No cervical lymphadenopathy. No thyromegaly Chest/Lungs:   Breathing-non-labored, Good air entry bilaterally, breath sounds normal without rales, rhonchi, or wheezing  CVS: S1 S2 regular, no murmurs, gallops, rubs  Neurology:  CN II-XII grossly intact, Non focal.   Psych:  TP linear. J/I WNL. Normal speech. Appropriate eye contact and affect.  Skin:  No Rash  Data Review Lab Results  Component Value Date   HGBA1C 6.2 (H) 11/14/2017   HGBA1C 6.6 01/10/2017   HGBA1C 6.2 07/02/2016     Assessment & Plan   1. Impacted cerumen of right ear Resolved but patient still c/o decreased hearing.  No pain  2. Controlled type 2 diabetes mellitus with hyperglycemia, without long-term current use of insulin (HCC) Not at goal.  Improve/work on diet/eliminating sugars - Glucose (  CBG)  3. Essential hypertension Controlled-continue current regimen.     Patient have been counseled extensively about nutrition and exercise  Return in about 1 month (around 03/02/2018) for Dr Lamar Benes DM/htn.  The patient was given clear instructions to go to ER or return to medical center if symptoms don't improve, worsen or new problems develop. The patient verbalized understanding. The patient was told to call to get lab results if they haven't heard anything in the next week.     Freeman Caldron, PA-C [facility] and [place], [place]   01/30/2018, 2:17 PM

## 2018-01-30 NOTE — Telephone Encounter (Signed)
Sarah [name] from [facility] called to request a medication refill on her Dm supplies, please follow up PHONE:[phone]

## 2018-03-06 ENCOUNTER — Ambulatory Visit: Payer: Medicare Other | Attending: Internal Medicine | Admitting: Internal Medicine

## 2018-03-06 ENCOUNTER — Encounter: Payer: Self-pay | Admitting: Internal Medicine

## 2018-03-06 VITALS — BP 115/70 | HR 75 | Temp 98.1°F | Resp 16 | Wt 214.0 lb

## 2018-03-06 DIAGNOSIS — F17200 Nicotine dependence, unspecified, uncomplicated: Secondary | ICD-10-CM

## 2018-03-06 DIAGNOSIS — F17210 Nicotine dependence, cigarettes, uncomplicated: Secondary | ICD-10-CM | POA: Insufficient documentation

## 2018-03-06 DIAGNOSIS — I10 Essential (primary) hypertension: Secondary | ICD-10-CM | POA: Diagnosis not present

## 2018-03-06 DIAGNOSIS — E6609 Other obesity due to excess calories: Secondary | ICD-10-CM

## 2018-03-06 DIAGNOSIS — E119 Type 2 diabetes mellitus without complications: Secondary | ICD-10-CM

## 2018-03-06 DIAGNOSIS — M109 Gout, unspecified: Secondary | ICD-10-CM | POA: Insufficient documentation

## 2018-03-06 DIAGNOSIS — H9191 Unspecified hearing loss, right ear: Secondary | ICD-10-CM | POA: Insufficient documentation

## 2018-03-06 DIAGNOSIS — Z7982 Long term (current) use of aspirin: Secondary | ICD-10-CM | POA: Insufficient documentation

## 2018-03-06 DIAGNOSIS — Z7984 Long term (current) use of oral hypoglycemic drugs: Secondary | ICD-10-CM | POA: Insufficient documentation

## 2018-03-06 DIAGNOSIS — E1142 Type 2 diabetes mellitus with diabetic polyneuropathy: Secondary | ICD-10-CM | POA: Insufficient documentation

## 2018-03-06 DIAGNOSIS — Z6832 Body mass index (BMI) 32.0-32.9, adult: Secondary | ICD-10-CM | POA: Insufficient documentation

## 2018-03-06 DIAGNOSIS — M1990 Unspecified osteoarthritis, unspecified site: Secondary | ICD-10-CM | POA: Insufficient documentation

## 2018-03-06 DIAGNOSIS — Z880 Allergy status to penicillin: Secondary | ICD-10-CM | POA: Insufficient documentation

## 2018-03-06 DIAGNOSIS — Z886 Allergy status to analgesic agent status: Secondary | ICD-10-CM | POA: Insufficient documentation

## 2018-03-06 DIAGNOSIS — E1165 Type 2 diabetes mellitus with hyperglycemia: Secondary | ICD-10-CM | POA: Diagnosis not present

## 2018-03-06 DIAGNOSIS — Z79899 Other long term (current) drug therapy: Secondary | ICD-10-CM | POA: Insufficient documentation

## 2018-03-06 DIAGNOSIS — E785 Hyperlipidemia, unspecified: Secondary | ICD-10-CM | POA: Insufficient documentation

## 2018-03-06 DIAGNOSIS — J449 Chronic obstructive pulmonary disease, unspecified: Secondary | ICD-10-CM | POA: Insufficient documentation

## 2018-03-06 LAB — POCT GLYCOSYLATED HEMOGLOBIN (HGB A1C): HBA1C, POC (PREDIABETIC RANGE): 5.8 % (ref 5.7–6.4)

## 2018-03-06 LAB — GLUCOSE, POCT (MANUAL RESULT ENTRY): POC Glucose: 126 mg/dl — AB (ref 70–99)

## 2018-03-06 NOTE — Progress Notes (Signed)
Patient ID: Sarah [name], female    DOB: 04/06/1956  MRN: [id number]  CC: Diabetes and Hypertension   Subjective: Sarah [name] is a 62 y.o. female who presents for chronic disease management Her concerns today include:  62 year old with history of diabetes, hypertension, OA of multiple sites, COPD, tob dep, gout, and hyperlipidemia.  C/o continuous problem with her RT  ear.  "It closes up and feel like something running."  Opens up when she leans head to the RT side Occasional epistaxis Referred to ENT.  Referral was submitted to [facility] but patient states she has not received a call as yet.  DM: needs new meter.   Doing well with eating habits.  Admits that regular [employer] is her weakness; drinks 4-5 cans a day Loss 14 lbs in the past year.  Wants to get to 150 lbs.   HTN: Reports compliance with lisinopril, HCTZ and amlodipine.  She tries to limit salt in the foods.  COPD:  Breathing okay.  Not using inhalers consistently.   Still smoking.  Uses nicotine patches inconsistently that she gets for free through her insurance.  Hard time getting the patches to stay attach to skin for full 24 hrs   Patient Active Problem List   Diagnosis Date Noted  . Adrenal adenoma 11/08/2017  . Lipoma 01/10/2017  . Gum disease 01/10/2017  . DM neuropathy, type II diabetes mellitus ([place]) 12/12/2015  . GERD (gastroesophageal reflux disease) 03/02/2015  . Abdominal wall hernia 01/05/2013  . Diabetes type 2, controlled ([place])   . HTN (hypertension) 06/27/2011  . TOBACCO ABUSE 02/05/2007  . CONSTIPATION NOS 02/05/2007  . OSTEOARTHROSIS, GENERALIZED, MULTIPLE SITES 02/05/2007  . LOW BACK PAIN 02/05/2007     Current Outpatient Medications on File Prior to Visit  Medication Sig Dispense Refill  . albuterol (PROAIR HFA) 108 (90 Base) MCG/ACT inhaler INHALE 2 PUFFS BY MOUTH EVERY 4 HOURS AS NEEDED FOR WHEEZING OR SHORTNESS OF BREATH 8.5 g 12  . allopurinol (ZYLOPRIM) 100 MG tablet Take 1  tablet (100 mg total) by mouth daily. 90 tablet 2  . amLODipine (NORVASC) 10 MG tablet Take 1 tablet (10 mg total) by mouth daily. 90 tablet 3  . aspirin EC 81 MG tablet Take 1 tablet (81 mg total) by mouth daily. 90 tablet 3  . blood glucose meter kit and supplies KIT Libra Freestyle meter 1 each 0  . budesonide-formoterol (SYMBICORT) 160-4.5 MCG/ACT inhaler Inhale 2 puffs into the lungs 2 (two) times daily. 1 Inhaler 2  . fluticasone (FLONASE) 50 MCG/ACT nasal spray Place 2 sprays into both nostrils daily. 16 g 6  . glimepiride (AMARYL) 1 MG tablet Take 1 tablet (1 mg total) by mouth daily with breakfast. 90 tablet 3  . hydrochlorothiazide (HYDRODIURIL) 25 MG tablet Take 1 tablet (25 mg total) by mouth daily. 90 tablet 0  . ipratropium (ATROVENT HFA) 17 MCG/ACT inhaler Inhale 2 puffs into the lungs every 6 (six) hours as needed for wheezing. (Patient not taking: Reported on 07/02/2016) 1 Inhaler 12  . lisinopril (PRINIVIL,ZESTRIL) 5 MG tablet Take 1 tablet (5 mg total) by mouth every morning. 90 tablet 1  . meloxicam (MOBIC) 15 MG tablet Take 1 tablet (15 mg total) by mouth daily. 30 tablet 5  . metFORMIN (GLUCOPHAGE) 500 MG tablet take 1 tablet by mouth once daily with BREAKFAST 90 tablet 3  . Polyethyl Glycol-Propyl Glycol (SYSTANE OP) Place 1 drop into both eyes 3 (three) times daily.    [name]  pravastatin (PRAVACHOL) 20 MG tablet Take 1 tablet (20 mg total) by mouth daily. 90 tablet 3  . ranitidine (ZANTAC) 150 MG tablet Take 1 tablet (150 mg total) by mouth 2 (two) times daily. (Patient taking differently: Take 150 mg by mouth daily. ) 60 tablet 2  . terbinafine (LAMISIL AT) 1 % cream Apply 1 application topically 2 (two) times daily. 30 g 1   No current facility-administered medications on file prior to visit.     Allergies  Allergen Reactions  . Aspirin Other (See Comments)    Causes her stomach pain.  . Ibuprofen Other (See Comments)    Causes stomach pain  . Penicillins Other (See  Comments)    Localized whelps and redness after injection Has patient had a PCN reaction causing immediate rash, facial/tongue/throat swelling, SOB or lightheadedness with hypotension: No Has patient had a PCN reaction causing severe rash involving mucus membranes or skin necrosis: Yes Has patient had a PCN reaction that required hospitalization No Has patient had a PCN reaction occurring within the last 10 years: No If all of the above answers are "NO", then may proceed with Cephalosporin use.     Social History   Socioeconomic History  . Marital status: Married    Spouse name: Not on file  . Number of children: Not on file  . Years of education: Not on file  . Highest education level: Not on file  Occupational History  . Not on file  Social Needs  . Financial resource strain: Not on file  . Food insecurity:    Worry: Not on file    Inability: Not on file  . Transportation needs:    Medical: Not on file    Non-medical: Not on file  Tobacco Use  . Smoking status: Current Every Day Smoker    Packs/day: 1.50    Years: 40.00    Pack years: 60.00    Types: Cigarettes  . Smokeless tobacco: Never Used  Substance and Sexual Activity  . Alcohol use: No    Comment: hx alcohol abuse- drank >fifth of gin daily/  states Quit 2002  . Drug use: No  . Sexual activity: Not on file  Lifestyle  . Physical activity:    Days per week: Not on file    Minutes per session: Not on file  . Stress: Not on file  Relationships  . Social connections:    Talks on phone: Not on file    Gets together: Not on file    Attends religious service: Not on file    Active member of club or organization: Not on file    Attends meetings of clubs or organizations: Not on file    Relationship status: Not on file  . Intimate partner violence:    Fear of current or ex partner: Not on file    Emotionally abused: Not on file    Physically abused: Not on file    Forced sexual activity: Not on file  Other  Topics Concern  . Not on file  Social History Narrative  . Not on file    No family history on file.  Past Surgical History:  Procedure Laterality Date  . DILATION AND CURETTAGE OF UTERUS    . INSERTION OF MESH N/A 07/20/2014   Procedure: INSERTION OF MESH;  Surgeon: Fanny Skates, MD;  Location: [facility];  Service: General;  Laterality: N/A;  . KNEE ARTHROSCOPY Left 2000  . TOTAL ABDOMINAL HYSTERECTOMY W/ BILATERAL SALPINGOOPHORECTOMY  1995  .  TRANSTHORACIC ECHOCARDIOGRAM  07-22-2014   mild LVH,  60-65%,  grade I diastolic dysfunction/  mild LAE  . VENTRAL HERNIA REPAIR N/A 07/20/2014   Procedure: LAPAROSCOPIC REPAIR INCARCARATED VENTRAL HERNIA AND UMBILICAL HERNIA;  Surgeon: Fanny Skates, MD;  Location: [facility];  Service: General;  Laterality: N/A;    ROS: Review of Systems Negative except as above  PHYSICAL EXAM: BP 115/70   Pulse 75   Temp 98.1 F (36.7 C) (Oral)   Resp 16   Wt 214 lb (97.1 kg)   SpO2 96%   BMI 32.54 kg/m   Wt Readings from Last 3 Encounters:  03/06/18 214 lb (97.1 kg)  01/30/18 213 lb 3.2 oz (96.7 kg)  11/08/17 215 lb 6.4 oz (97.7 kg)    Physical Exam General appearance - alert, well appearing, and in no distress Mental status - normal mood, behavior, speech, dress, motor activity, and thought processes Ears - bilateral TM's and external ear canals normal Nose - normal and patent, no erythema, discharge or polyps Neck - supple, no significant adenopathy Chest - clear to auscultation, no wheezes, rales or rhonchi, symmetric air entry Heart - normal rate, regular rhythm, normal S1, S2, no murmurs, rubs, clicks or gallops Extremities - peripheral pulses normal, no pedal edema, no clubbing or cyanosis   Results for orders placed or performed in visit on 03/06/18  POCT glucose (manual entry)  Result Value Ref Range   POC Glucose 126 (A) 70 - 99 mg/dl  POCT glycosylated hemoglobin (Hb A1C)  Result Value Ref Range   Hemoglobin A1C     HbA1c POC  (<> result, manual entry)     HbA1c, POC (prediabetic range) 5.8 5.7 - 6.4 %   HbA1c, POC (controlled diabetic range)      ASSESSMENT AND PLAN: 1. Controlled type 2 diabetes mellitus without complication, without long-term current use of insulin (HCC) At goal.  Continue Amaryl.  Encourage healthy eating habits. - POCT glucose (manual entry) - POCT glycosylated hemoglobin (Hb A1C)  2. Essential hypertension At goal.  Continue current medications and DASH diet.  3. Decreased hearing of right ear Message sent to our referral coordinator inquiring about the ENT referral  4. Tobacco dependence Continue to encourage her to quit.  Advised to use the patches consistently.  She can purchase adhesive tape over-the-counter and use to help keep the patch attached  5. Chronic obstructive pulmonary disease, unspecified COPD type ([place]) Clinically stable  6. Class 1 obesity due to excess calories with serious comorbidity and body mass index (BMI) of 32.0 to 32.9 in adult Commended her on weight loss so far.  Dietary counseling given.  Encouraged her to cut back on drinking sodas   Patient was given the opportunity to ask questions.  Patient verbalized understanding of the plan and was able to repeat key elements of the plan.   Orders Placed This Encounter  Procedures  . POCT glucose (manual entry)  . POCT glycosylated hemoglobin (Hb A1C)     Requested Prescriptions    No prescriptions requested or ordered in this encounter    Return in about 4 months (around 07/06/2018).  Karle Plumber, MD, FACP

## 2018-03-06 NOTE — Patient Instructions (Addendum)
Your appointment with [facility] ENT is scheduled for 03/20/2018 @1 :40pm  [address]  [phone]

## 2018-03-07 ENCOUNTER — Telehealth: Payer: Self-pay | Admitting: Internal Medicine

## 2018-03-07 NOTE — Telephone Encounter (Signed)
-----   Message from Ena Dawley sent at 03/07/2018  8:49 AM EDT ----- Kermit Balo  Morning  Ent called yesterday to inform that Mrs Goucher  Have an appointment  03-20-18 @ 1:40pm  With Dr Constance Holster and Rolm Gala informed ja'ya is a note in the referral . I called mrs Riemann and her voice mail is not activated .  ----- Message ----- From: Ladell Pier, MD Sent: 03/06/2018   5:37 PM EDT To: Ena Dawley  Patient referred to ENT.  I saw her today.  She states she has not received a call from them as yet.  Could you please check into this?

## 2018-03-12 ENCOUNTER — Other Ambulatory Visit: Payer: Self-pay

## 2018-03-12 DIAGNOSIS — E119 Type 2 diabetes mellitus without complications: Secondary | ICD-10-CM

## 2018-03-12 MED ORDER — ONETOUCH DELICA LANCETS 33G MISC: 12 refills | Status: AC

## 2018-03-12 MED ORDER — ONETOUCH ULTRA MINI W/DEVICE KIT: PACK | 0 refills | Status: AC

## 2018-03-12 MED ORDER — GLUCOSE BLOOD VI STRP: ORAL_STRIP | 12 refills | Status: AC

## 2018-03-20 DIAGNOSIS — H9121 Sudden idiopathic hearing loss, right ear: Secondary | ICD-10-CM | POA: Insufficient documentation

## 2018-06-02 ENCOUNTER — Ambulatory Visit: Payer: Medicare Other | Admitting: Internal Medicine

## 2018-06-02 ENCOUNTER — Telehealth: Payer: Self-pay | Admitting: Internal Medicine

## 2018-06-02 NOTE — Telephone Encounter (Signed)
What type of supplies

## 2018-06-02 NOTE — Telephone Encounter (Signed)
Pt called stating Medicare faxed over paperwork about her Supplies, please follow up with an update when possible

## 2018-06-20 ENCOUNTER — Ambulatory Visit: Payer: Medicare Other | Admitting: Internal Medicine

## 2018-07-30 ENCOUNTER — Other Ambulatory Visit: Payer: Self-pay | Admitting: Internal Medicine

## 2018-07-30 ENCOUNTER — Ambulatory Visit
Admission: RE | Admit: 2018-07-30 | Discharge: 2018-07-30 | Disposition: A | Discharge status: Home / Self Care | Payer: Medicare Other | Source: Ambulatory Visit | Attending: Internal Medicine | Admitting: Internal Medicine

## 2018-07-30 DIAGNOSIS — G8929 Other chronic pain: Secondary | ICD-10-CM

## 2018-07-30 DIAGNOSIS — M25561 Pain in right knee: Principal | ICD-10-CM

## 2018-08-15 ENCOUNTER — Other Ambulatory Visit: Payer: Self-pay | Admitting: Internal Medicine

## 2018-08-15 DIAGNOSIS — I10 Essential (primary) hypertension: Secondary | ICD-10-CM

## 2019-05-08 ENCOUNTER — Other Ambulatory Visit: Payer: Self-pay

## 2019-05-08 ENCOUNTER — Ambulatory Visit (INDEPENDENT_AMBULATORY_CARE_PROVIDER_SITE_OTHER): Payer: Medicare Other | Admitting: Podiatry

## 2019-05-08 ENCOUNTER — Ambulatory Visit (INDEPENDENT_AMBULATORY_CARE_PROVIDER_SITE_OTHER): Payer: Medicare Other

## 2019-05-08 DIAGNOSIS — M79671 Pain in right foot: Secondary | ICD-10-CM

## 2019-05-08 DIAGNOSIS — M778 Other enthesopathies, not elsewhere classified: Secondary | ICD-10-CM

## 2019-05-08 DIAGNOSIS — M79672 Pain in left foot: Secondary | ICD-10-CM

## 2019-05-08 NOTE — Progress Notes (Signed)
Subjective:  Patient ID: Sarah [name], female    DOB: 10-13-1955,  MRN: [id number]  Chief Complaint  Patient presents with  . Foot Pain    Pt states generalized bilateral foot pain for "years", no known injuries.    63 y.o. female presents with the above complaint.  Also complains of painful lesion to the outside of the left heel.  Review of Systems: Negative except as noted in the HPI. Denies N/V/F/Ch.  Past Medical History:  Diagnosis Date  . Acute pancreatitis    01-13-2015 per ED note  . Adrenal adenoma    bilateral  . Arthritis   . Bilateral lower extremity edema   . Complication of anesthesia    post-op 07-20-2014 surgery post-op Hypoxia due to acute CHF/ mild Pulmonary edema  . COPD (chronic obstructive pulmonary disease) ([place])   . Gastritis    01-13-2015  per ED note  . GERD (gastroesophageal reflux disease)   . Headache   . History of acute congestive heart failure    07-22-2014   post-op surgery 07-20-2014  . History of acute pancreatitis    01-04-2013   . History of alcohol abuse    state quit 2002/  drank daily >fifth of gin  . History of DVT of lower extremity    2000  post op left knee surgery  . History of pulmonary edema    mild -  07-22-2014  post-op  surgery 07-20-2014  . Hypertension   . Type 2 diabetes mellitus ([place])   . Ulnar nerve compression    right elbow    Current Outpatient Medications:  .  albuterol (PROAIR HFA) 108 (90 Base) MCG/ACT inhaler, INHALE 2 PUFFS BY MOUTH EVERY 4 HOURS AS NEEDED FOR WHEEZING OR SHORTNESS OF BREATH, Disp: 8.5 g, Rfl: 12 .  allopurinol (ZYLOPRIM) 100 MG tablet, Take 1 tablet (100 mg total) by mouth daily., Disp: 90 tablet, Rfl: 2 .  amLODipine (NORVASC) 10 MG tablet, Take 1 tablet (10 mg total) by mouth daily., Disp: 90 tablet, Rfl: 3 .  aspirin EC 81 MG tablet, Take 1 tablet (81 mg total) by mouth daily., Disp: 90 tablet, Rfl: 3 .  blood glucose meter kit and supplies KIT, Libra Freestyle meter, Disp: 1 each,  Rfl: 0 .  Blood Glucose Monitoring Suppl (ONE TOUCH ULTRA MINI) w/Device KIT, Use as directed to test blood sugar once daily. DX code E11.9, Disp: 1 each, Rfl: 0 .  budesonide-formoterol (SYMBICORT) 160-4.5 MCG/ACT inhaler, Inhale 2 puffs into the lungs 2 (two) times daily., Disp: 1 Inhaler, Rfl: 2 .  cyclobenzaprine (FLEXERIL) 5 MG tablet, Take 5 mg by mouth 3 (three) times daily as needed., Disp: , Rfl:  .  escitalopram (LEXAPRO) 10 MG tablet, Take 10 mg by mouth daily., Disp: , Rfl:  .  famotidine (PEPCID) 20 MG tablet, Take 20 mg by mouth 2 (two) times daily., Disp: , Rfl:  .  fluticasone (FLONASE) 50 MCG/ACT nasal spray, Place 2 sprays into both nostrils daily., Disp: 16 g, Rfl: 6 .  glimepiride (AMARYL) 1 MG tablet, Take 1 tablet (1 mg total) by mouth daily with breakfast., Disp: 90 tablet, Rfl: 3 .  glucose blood (ONE TOUCH ULTRA TEST) test strip, Use as directed to test blood sugar once daily. DX code E11.9, Disp: 100 each, Rfl: 12 .  hydrochlorothiazide (HYDRODIURIL) 25 MG tablet, Take 1 tablet (25 mg total) by mouth daily., Disp: 90 tablet, Rfl: 0 .  HYDROcodone-acetaminophen (NORCO/VICODIN) 5-325 MG tablet, Take 1 tablet  by mouth every 6 (six) hours as needed for moderate pain., Disp: , Rfl:  .  ipratropium (ATROVENT HFA) 17 MCG/ACT inhaler, Inhale 2 puffs into the lungs every 6 (six) hours as needed for wheezing., Disp: 1 Inhaler, Rfl: 12 .  lisinopril (PRINIVIL,ZESTRIL) 5 MG tablet, Take 1 tablet (5 mg total) by mouth every morning., Disp: 90 tablet, Rfl: 1 .  meloxicam (MOBIC) 15 MG tablet, Take 1 tablet (15 mg total) by mouth daily., Disp: 30 tablet, Rfl: 5 .  metFORMIN (GLUCOPHAGE) 500 MG tablet, take 1 tablet by mouth once daily with BREAKFAST, Disp: 90 tablet, Rfl: 3 .  ONETOUCH DELICA LANCETS 64P MISC, Use as directed to test blood sugar once daily. DX code E11.9, Disp: 100 each, Rfl: 12 .  Polyethyl Glycol-Propyl Glycol (SYSTANE OP), Place 1 drop into both eyes 3 (three) times  daily., Disp: , Rfl:  .  pravastatin (PRAVACHOL) 20 MG tablet, Take 1 tablet (20 mg total) by mouth daily., Disp: 90 tablet, Rfl: 3 .  predniSONE (STERAPRED UNI-PAK 21 TAB) 10 MG (21) TBPK tablet, FPD, Disp: , Rfl:  .  ranitidine (ZANTAC) 150 MG tablet, Take 1 tablet (150 mg total) by mouth 2 (two) times daily. (Patient taking differently: Take 150 mg by mouth daily. ), Disp: 60 tablet, Rfl: 2 .  terbinafine (LAMISIL AT) 1 % cream, Apply 1 application topically 2 (two) times daily., Disp: 30 g, Rfl: 1  Social History   Tobacco Use  Smoking Status Current Every Day Smoker  . Packs/day: 1.50  . Years: 40.00  . Pack years: 60.00  . Types: Cigarettes  Smokeless Tobacco Never Used    Allergies  Allergen Reactions  . Aspirin Other (See Comments)    Causes her stomach pain.  . Ibuprofen Other (See Comments)    Causes stomach pain  . Penicillins Other (See Comments)    Localized whelps and redness after injection Has patient had a PCN reaction causing immediate rash, facial/tongue/throat swelling, SOB or lightheadedness with hypotension: No Has patient had a PCN reaction causing severe rash involving mucus membranes or skin necrosis: Yes Has patient had a PCN reaction that required hospitalization No Has patient had a PCN reaction occurring within the last 10 years: No If all of the above answers are "NO", then may proceed with Cephalosporin use.    Objective:  There were no vitals filed for this visit. There is no height or weight on file to calculate BMI. Constitutional Well developed. Well nourished.  Vascular Dorsalis pedis pulses palpable bilaterally. Posterior tibial pulses palpable bilaterally. Capillary refill normal to all digits.  No cyanosis or clubbing noted. Pedal hair growth normal.  Neurologic Normal speech. Oriented to person, place, and time. Epicritic sensation to light touch grossly present bilaterally.  Dermatologic Nails well groomed and normal in appearance.  No open wounds. Punctate verrucous lesion lateral heel left  Orthopedic: POP lateral heel   Assessment:   1. Capsulitis of left foot   2. Capsulitis of right foot    Plan:  Patient was evaluated and treated and all questions answered.  Porokeratosis lateral heel left -Debrided as below and destroyed  Procedure: Destruction of Lesion Location: lateral heel left Anesthesia: none Instrumentation: 312 blade. Technique: Debridement of lesion to petechial bleeding. Aperture pad applied around lesion. Small amount of salinocaine applied to the base of the lesion. Dressing: Dry, sterile, compression dressing. Disposition: Patient tolerated procedure well. Advised to leave dressing on for 6-8 hours. Thereafter patient to wash the area with  soap and water and applied band-aid. Off-loading pads dispensed. Patient to return in 2 weeks for follow-up.   Return if symptoms worsen or fail to improve.

## 2019-06-01 ENCOUNTER — Other Ambulatory Visit: Payer: Self-pay | Admitting: Podiatry

## 2019-06-01 DIAGNOSIS — M778 Other enthesopathies, not elsewhere classified: Secondary | ICD-10-CM

## 2019-09-04 ENCOUNTER — Other Ambulatory Visit: Payer: Self-pay | Admitting: Internal Medicine

## 2019-09-04 DIAGNOSIS — E119 Type 2 diabetes mellitus without complications: Secondary | ICD-10-CM

## 2019-09-18 ENCOUNTER — Other Ambulatory Visit: Payer: Self-pay | Admitting: Internal Medicine

## 2019-09-18 DIAGNOSIS — E119 Type 2 diabetes mellitus without complications: Secondary | ICD-10-CM

## 2019-09-24 ENCOUNTER — Ambulatory Visit
Admission: RE | Admit: 2019-09-24 | Discharge: 2019-09-24 | Disposition: A | Discharge status: Home / Self Care | Payer: Medicare Other | Source: Ambulatory Visit | Attending: Family | Admitting: Family

## 2019-09-24 ENCOUNTER — Other Ambulatory Visit: Payer: Self-pay | Admitting: Family

## 2019-09-24 ENCOUNTER — Other Ambulatory Visit: Payer: Self-pay

## 2019-09-24 DIAGNOSIS — M549 Dorsalgia, unspecified: Secondary | ICD-10-CM

## 2019-09-24 DIAGNOSIS — R109 Unspecified abdominal pain: Secondary | ICD-10-CM

## 2019-10-27 ENCOUNTER — Ambulatory Visit: Payer: Medicare Other | Attending: Internal Medicine

## 2019-10-27 DIAGNOSIS — Z23 Encounter for immunization: Secondary | ICD-10-CM

## 2019-10-27 NOTE — Progress Notes (Signed)
   Covid-19 Vaccination Clinic  Name:  Sarah [name]    MRN: [id number] DOB: 1956-01-17  10/27/2019  Sarah [name] was observed post Covid-19 immunization for 15 minutes without incident. She was provided with Vaccine Information Sheet and instruction to access the V-Safe system.   Sarah [name] was instructed to call 911 with any severe reactions post vaccine: [name] Difficulty breathing  . Swelling of face and throat  . A fast heartbeat  . A bad rash all over body  . Dizziness and weakness   Immunizations Administered    Name Date Dose VIS Date Route   Pfizer COVID-19 Vaccine 10/27/2019 10:15 AM 0.3 mL 08/12/2018 Intramuscular   Manufacturer: [employer], [employer]   Lot: [id number]   [place]: [id number]

## 2019-11-17 ENCOUNTER — Ambulatory Visit: Payer: Medicare Other | Attending: Internal Medicine

## 2019-11-17 DIAGNOSIS — Z23 Encounter for immunization: Secondary | ICD-10-CM

## 2019-11-17 NOTE — Progress Notes (Signed)
   Covid-19 Vaccination Clinic  Name:  Sarah [name]    MRN: [id number] DOB: 28-Feb-1956  11/17/2019  [name] was observed post Covid-19 immunization for 15 minutes without incident. She was provided with Vaccine Information Sheet and instruction to access the V-Safe system.   [name] was instructed to call 911 with any severe reactions post vaccine: [name] Difficulty breathing  . Swelling of face and throat  . A fast heartbeat  . A bad rash all over body  . Dizziness and weakness   Immunizations Administered    Name Date Dose VIS Date Route   Pfizer COVID-19 Vaccine 11/17/2019 10:40 AM 0.3 mL 08/12/2018 Intramuscular   Manufacturer: [employer], [employer]   Lot: [id number]   [place]: [id number]

## 2019-12-16 ENCOUNTER — Other Ambulatory Visit: Payer: Self-pay | Admitting: Family

## 2019-12-16 DIAGNOSIS — R911 Solitary pulmonary nodule: Secondary | ICD-10-CM

## 2019-12-17 ENCOUNTER — Other Ambulatory Visit: Payer: Self-pay | Admitting: Family

## 2019-12-17 DIAGNOSIS — Z1231 Encounter for screening mammogram for malignant neoplasm of breast: Secondary | ICD-10-CM

## 2019-12-24 ENCOUNTER — Ambulatory Visit
Admission: RE | Admit: 2019-12-24 | Discharge: 2019-12-24 | Disposition: A | Discharge status: Home / Self Care | Payer: Medicare Other | Source: Ambulatory Visit | Attending: Family | Admitting: Family

## 2019-12-24 DIAGNOSIS — R911 Solitary pulmonary nodule: Secondary | ICD-10-CM

## 2020-01-08 DIAGNOSIS — M25512 Pain in left shoulder: Secondary | ICD-10-CM | POA: Insufficient documentation

## 2020-01-19 ENCOUNTER — Other Ambulatory Visit: Payer: Self-pay

## 2020-01-19 ENCOUNTER — Ambulatory Visit
Admission: RE | Admit: 2020-01-19 | Discharge: 2020-01-19 | Disposition: A | Discharge status: Home / Self Care | Payer: Medicare Other | Source: Ambulatory Visit | Attending: Family | Admitting: Family

## 2020-01-19 DIAGNOSIS — Z1231 Encounter for screening mammogram for malignant neoplasm of breast: Secondary | ICD-10-CM

## 2020-04-25 ENCOUNTER — Encounter (HOSPITAL_COMMUNITY): Payer: Self-pay | Admitting: Emergency Medicine

## 2020-04-25 ENCOUNTER — Ambulatory Visit (HOSPITAL_COMMUNITY)
Admission: EM | Admit: 2020-04-25 | Discharge: 2020-04-25 | Disposition: A | Discharge status: Home / Self Care | Payer: Medicare Other | Attending: Family Medicine | Admitting: Family Medicine

## 2020-04-25 ENCOUNTER — Other Ambulatory Visit: Payer: Self-pay

## 2020-04-25 ENCOUNTER — Ambulatory Visit (INDEPENDENT_AMBULATORY_CARE_PROVIDER_SITE_OTHER): Payer: Medicare Other

## 2020-04-25 DIAGNOSIS — R0602 Shortness of breath: Secondary | ICD-10-CM | POA: Diagnosis not present

## 2020-04-25 DIAGNOSIS — R079 Chest pain, unspecified: Secondary | ICD-10-CM

## 2020-04-25 DIAGNOSIS — J22 Unspecified acute lower respiratory infection: Secondary | ICD-10-CM

## 2020-04-25 DIAGNOSIS — J849 Interstitial pulmonary disease, unspecified: Secondary | ICD-10-CM

## 2020-04-25 MED ORDER — IPRATROPIUM BROMIDE 0.02 % IN SOLN: 0.5000 mg | Freq: Four times a day (QID) | RESPIRATORY_TRACT | 0 refills | Status: DC

## 2020-04-25 MED ORDER — AZITHROMYCIN 250 MG PO TABS
250.0000 mg | ORAL_TABLET | Freq: Every day | ORAL | 0 refills | Status: DC
Start: 2020-04-25 — End: 2020-11-22

## 2020-04-25 MED ORDER — ALBUTEROL SULFATE (2.5 MG/3ML) 0.083% IN NEBU: 2.5000 mg | INHALATION_SOLUTION | Freq: Four times a day (QID) | RESPIRATORY_TRACT | 0 refills | Status: DC | PRN

## 2020-04-25 MED ORDER — PREDNISONE 20 MG PO TABS: 20.0000 mg | ORAL_TABLET | Freq: Two times a day (BID) | ORAL | 0 refills | Status: DC

## 2020-04-25 NOTE — Discharge Instructions (Signed)
Drink plenty of water Take antibiotic as directed Take prednisone 2 times a day for 5 days I have provided albuterol and Atrovent fluids for a nebulizer We are providing a nebulizer Follow-up with your personal physician

## 2020-04-25 NOTE — ED Triage Notes (Signed)
center, to left chest pain, neck pain, right arm tightness.  Reports this started during the night.  Patient reports no pain if she is very still or doesn't breathe.  Patient states she feels like she has pneumonia.  Patient reports coughing up "chunks of white".

## 2020-04-26 NOTE — ED Provider Notes (Signed)
[facility]    CSN: [id number] Arrival date & time: 04/25/20  1814      History   Chief Complaint Chief Complaint  Patient presents with  . Chest Pain    HPI Sarah [name] is a 64 y.o. female.   HPI   Patient is here for chest pain and shortness of breath.  She states she was told by her provider to come in for an EKG.  She states that she has been sick for the last couple of days and getting worse.  She states she is "coughing up chunks".  Patient is a smoker.  Has COPD.  Has history of congestive heart failure.  Does not have exertional chest pain but states it hurts in her left chest under the left breast and goes through to her back, hurts worse with breathing.  She states she feels weak and tired.  No fever or chills.  She is Covid vaccinated.  No known exposure to Covid.  No loss of taste or smell.  No nausea or vomiting.  Past Medical History:  Diagnosis Date  . Acute pancreatitis    01-13-2015 per ED note  . Adrenal adenoma    bilateral  . Arthritis   . Bilateral lower extremity edema   . Complication of anesthesia    post-op 07-20-2014 surgery post-op Hypoxia due to acute CHF/ mild Pulmonary edema  . COPD (chronic obstructive pulmonary disease) ([place])   . Gastritis    01-13-2015  per ED note  . GERD (gastroesophageal reflux disease)   . Headache   . History of acute congestive heart failure    07-22-2014   post-op surgery 07-20-2014  . History of acute pancreatitis    01-04-2013   . History of alcohol abuse    state quit 2002/  drank daily >fifth of gin  . History of DVT of lower extremity    2000  post op left knee surgery  . History of pulmonary edema    mild -  07-22-2014  post-op  surgery 07-20-2014  . Hypertension   . Type 2 diabetes mellitus ([place])   . Ulnar nerve compression    right elbow    Patient Active Problem List   Diagnosis Date Noted  . Sudden right hearing loss 03/20/2018  . Dental infection 12/09/2017  . Smokes 2 packs of  cigarettes per day 12/09/2017  . Adrenal adenoma 11/08/2017  . Lipoma 01/10/2017  . Gum disease 01/10/2017  . 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 GERD (gastroesophageal reflux disease) 03/02/2015  . Abdominal wall hernia 01/05/2013  . Diabetes type 2, controlled ([place])   . HTN (hypertension) 06/27/2011  . TOBACCO ABUSE 02/05/2007  . CONSTIPATION NOS 02/05/2007  . OSTEOARTHROSIS, GENERALIZED, MULTIPLE SITES 02/05/2007  . LOW BACK PAIN 02/05/2007    Past Surgical History:  Procedure Laterality Date  . DILATION AND CURETTAGE OF UTERUS    . INSERTION OF MESH N/A 07/20/2014   Procedure: INSERTION OF MESH;  Surgeon: Fanny Skates, MD;  Location: [facility];  Service: General;  Laterality: N/A;  . KNEE ARTHROSCOPY Left 2000  . TOTAL ABDOMINAL HYSTERECTOMY W/ BILATERAL SALPINGOOPHORECTOMY  1995  . TRANSTHORACIC ECHOCARDIOGRAM  07-22-2014   mild LVH,  60-65%,  grade I diastolic dysfunction/  mild LAE  . VENTRAL HERNIA REPAIR N/A 07/20/2014   Procedure: LAPAROSCOPIC REPAIR INCARCARATED VENTRAL HERNIA AND UMBILICAL HERNIA;  Surgeon: Fanny Skates, MD;  Location: [facility];  Service: General;  Laterality: N/A;  OB History   No obstetric history on file.      Home Medications    Prior to Admission medications   Medication Sig Start Date End Date Taking? Authorizing Provider  albuterol (PROAIR HFA) 108 (90 Base) MCG/ACT inhaler INHALE 2 PUFFS BY MOUTH EVERY 4 HOURS AS NEEDED FOR WHEEZING OR SHORTNESS OF BREATH 12/21/17  Yes Ladell Pier, MD  aspirin EC 81 MG tablet Take 1 tablet (81 mg total) by mouth daily. 07/02/16  Yes Langeland, Dawn T, MD  budesonide-formoterol (SYMBICORT) 160-4.5 MCG/ACT inhaler Inhale 2 puffs into the lungs 2 (two) times daily. 01/14/17  Yes Ladell Pier, MD  famotidine (PEPCID) 20 MG tablet Take 20 mg by mouth 2 (two) times daily. 04/25/19  Yes [provider]  hydrochlorothiazide (HYDRODIURIL) 25 MG tablet Take 1 tablet (25 mg  total) by mouth daily. 10/28/17  Yes Ladell Pier, MD  HYDROcodone-acetaminophen (NORCO/VICODIN) 5-325 MG tablet Take 1 tablet by mouth every 6 (six) hours as needed for moderate pain.   Yes [provider]  ipratropium (ATROVENT HFA) 17 MCG/ACT inhaler Inhale 2 puffs into the lungs every 6 (six) hours as needed for wheezing. 06/30/15  Yes Funches, Josalyn, MD  meloxicam (MOBIC) 15 MG tablet Take 1 tablet (15 mg total) by mouth daily. 09/20/17  Yes Ladell Pier, MD  albuterol (PROVENTIL) (2.5 MG/3ML) 0.083% nebulizer solution Take 3 mLs (2.5 mg total) by nebulization every 6 (six) hours as needed for wheezing or shortness of breath. 04/25/20   Raylene Everts, MD  allopurinol (ZYLOPRIM) 100 MG tablet Take 1 tablet (100 mg total) by mouth daily. 12/21/17   Ladell Pier, MD  amLODipine (NORVASC) 10 MG tablet Take 1 tablet (10 mg total) by mouth daily. 12/21/17   Ladell Pier, MD  azithromycin (ZITHROMAX) 250 MG tablet Take 1 tablet (250 mg total) by mouth daily. Take first 2 tablets together, then 1 every day until finished. 04/25/20   Raylene Everts, MD  blood glucose meter kit and supplies KIT Libra Freestyle meter 03/14/17   Ladell Pier, MD  Blood Glucose Monitoring Suppl (ONE TOUCH ULTRA MINI) w/Device KIT Use as directed to test blood sugar once daily. DX code E11.9 03/12/18   Ladell Pier, MD  cyclobenzaprine (FLEXERIL) 5 MG tablet Take 5 mg by mouth 3 (three) times daily as needed. 04/27/19   [provider]  escitalopram (LEXAPRO) 10 MG tablet Take 10 mg by mouth daily. 04/27/19   [provider]  fluticasone (FLONASE) 50 MCG/ACT nasal spray Place 2 sprays into both nostrils daily. 01/30/18   Argentina Donovan, PA-C  glimepiride (AMARYL) 1 MG tablet Take 1 tablet (1 mg total) by mouth daily with breakfast. 12/21/17   Ladell Pier, MD  glucose blood (ONE TOUCH ULTRA TEST) test strip Use as directed to test blood sugar once daily. DX code  E11.9 03/12/18   Ladell Pier, MD  ipratropium (ATROVENT) 0.02 % nebulizer solution Take 2.5 mLs (0.5 mg total) by nebulization 4 (four) times daily. 04/25/20   Raylene Everts, MD  lisinopril (PRINIVIL,ZESTRIL) 5 MG tablet Take 1 tablet (5 mg total) by mouth every morning. 11/19/17   Ladell Pier, MD  metFORMIN (GLUCOPHAGE) 500 MG tablet take 1 tablet by mouth once daily with BREAKFAST 12/21/17   Ladell Pier, MD  [facility] DELICA LANCETS 50N MISC Use as directed to test blood sugar once daily. DX code E11.9 03/12/18   Ladell Pier,  MD  Polyethyl Glycol-Propyl Glycol (SYSTANE OP) Place 1 drop into both eyes 3 (three) times daily.    [provider]  pravastatin (PRAVACHOL) 20 MG tablet Take 1 tablet (20 mg total) by mouth daily. 08/12/17   Ladell Pier, MD  predniSONE (DELTASONE) 20 MG tablet Take 1 tablet (20 mg total) by mouth 2 (two) times daily with a meal. 04/25/20   Raylene Everts, MD  predniSONE (STERAPRED UNI-PAK 21 TAB) 10 MG (21) TBPK tablet FPD 12/11/18   [provider]  ranitidine (ZANTAC) 150 MG tablet Take 1 tablet (150 mg total) by mouth 2 (two) times daily. Patient taking differently: Take 150 mg by mouth daily.  09/20/17   Ladell Pier, MD  terbinafine (LAMISIL AT) 1 % cream Apply 1 application topically 2 (two) times daily. 12/21/17   Ladell Pier, MD    Family History No family history on file.  Social History Social History   Tobacco Use  . Smoking status: Current Every Day Smoker    Packs/day: 1.50    Years: 40.00    Pack years: 60.00    Types: Cigarettes  . Smokeless tobacco: Never Used  Vaping Use  . Vaping Use: Never used  Substance Use Topics  . Alcohol use: No    Comment: hx alcohol abuse- drank >fifth of gin daily/  states Quit 2002  . Drug use: No     Allergies   Aspirin, Ibuprofen, and Penicillins   Review of Systems Review of Systems See HPI  Physical Exam Triage Vital Signs ED Triage  Vitals  Enc Vitals Group     BP 04/25/20 1924 135/62     Pulse Rate 04/25/20 1924 88     Resp 04/25/20 1924 (!) 24     Temp 04/25/20 1924 98.1 F (36.7 C)     Temp Source 04/25/20 1924 Oral     SpO2 04/25/20 1924 96 %     Weight --      Height --      Head Circumference --      Peak Flow --      Pain Score 04/25/20 1917 8     Pain Loc --      Pain Edu? --      Excl. in [place]? --    No data found.  Updated Vital Signs BP 135/62 (BP Location: Right Arm)   Pulse 88   Temp 98.1 F (36.7 C) (Oral)   Resp (!) 24   SpO2 96%      Physical Exam Constitutional:      General: She is not in acute distress.    Appearance: She is well-developed. She is obese.     Comments: In wheelchair.  Significant for over weight  HENT:     Head: Normocephalic and atraumatic.     Right Ear: Tympanic membrane normal.     Left Ear: Tympanic membrane normal.     Nose: Nose normal. No congestion.     Mouth/Throat:     Mouth: Mucous membranes are moist.     Pharynx: No posterior oropharyngeal erythema.  Eyes:     Conjunctiva/sclera: Conjunctivae normal.     Pupils: Pupils are equal, round, and reactive to light.  Cardiovascular:     Rate and Rhythm: Normal rate and regular rhythm.     Heart sounds: Normal heart sounds.  Pulmonary:     Effort: Pulmonary effort is normal. No respiratory distress.     Breath sounds: Rales present.  Comments: Diminished breath sounds upper, rales lower Abdominal:     Palpations: Abdomen is soft.  Musculoskeletal:        General: Normal range of motion.     Cervical back: Normal range of motion.  Lymphadenopathy:     Cervical: No cervical adenopathy.  Skin:    General: Skin is warm and dry.  Neurological:     Mental Status: She is alert.  Psychiatric:        Mood and Affect: Mood normal.        Behavior: Behavior normal.      UC Treatments / Results  Labs (all labs ordered are listed, but only abnormal results are displayed) Labs Reviewed - No data  to display  EKG Normal rate and rhythm.  Normal intervals.  No ST or T wave changes.  Compared to prior.  Radiology DG Chest 2 View  Result Date: 04/25/2020 CLINICAL DATA:  Shortness of breath EXAM: CHEST - 2 VIEW COMPARISON:  03/09/2016 FINDINGS: Chronic cardiomegaly. Chronic abnormal interstitial lung markings, more prominent diffusely when compared to the study of 2017. This could represent progressive interstitial lung disease, but there could be a component of acute interstitial edema. No consolidation, collapse or effusion. No significant bone finding. Aortic atherosclerotic calcification is noted. IMPRESSION: Chronic cardiomegaly. Chronic abnormal interstitial lung markings, more prominent when compared to the study of 2017. This could represent progressive interstitial lung disease, but there could be a component of acute interstitial edema. Electronically Signed   By: Heidy Mccubbin Chimes M.D.   On: 04/25/2020 19:48    Procedures Procedures (including critical care time)  Medications Ordered in UC Medications - No data to display  Initial Impression / Assessment and Plan / UC Course  I have reviewed the triage vital signs and the nursing notes.  Pertinent labs & imaging results that were available during my care of the patient were reviewed by me and considered in my medical decision making (see chart for details).     Patient does not have a nebulizer at home and feels the inhalers are not working well for her Final Clinical Impressions(s) / UC Diagnoses   Final diagnoses:  LRTI (lower respiratory tract infection)  Interstitial lung disease ([place])  Chest pain, unspecified type     Discharge Instructions     Drink plenty of water Take antibiotic as directed Take prednisone 2 times a day for 5 days I have provided albuterol and Atrovent fluids for a nebulizer We are providing a nebulizer Follow-up with your personal physician   ED Prescriptions    Medication Sig Dispense  Auth. Provider   azithromycin (ZITHROMAX) 250 MG tablet Take 1 tablet (250 mg total) by mouth daily. Take first 2 tablets together, then 1 every day until finished. 6 tablet Raylene Everts, MD   predniSONE (DELTASONE) 20 MG tablet Take 1 tablet (20 mg total) by mouth 2 (two) times daily with a meal. 10 tablet Raylene Everts, MD   albuterol (PROVENTIL) (2.5 MG/3ML) 0.083% nebulizer solution Take 3 mLs (2.5 mg total) by nebulization every 6 (six) hours as needed for wheezing or shortness of breath. 75 mL Raylene Everts, MD   ipratropium (ATROVENT) 0.02 % nebulizer solution Take 2.5 mLs (0.5 mg total) by nebulization 4 (four) times daily. 75 mL Raylene Everts, MD     PDMP not reviewed this encounter.   Raylene Everts, MD 04/26/20 [id number]

## 2020-11-22 ENCOUNTER — Encounter (HOSPITAL_COMMUNITY): Payer: Self-pay | Admitting: *Deleted

## 2020-11-22 ENCOUNTER — Other Ambulatory Visit: Payer: Self-pay

## 2020-11-22 ENCOUNTER — Emergency Department (HOSPITAL_COMMUNITY): Payer: Medicare Other

## 2020-11-22 ENCOUNTER — Inpatient Hospital Stay (HOSPITAL_COMMUNITY)
Admission: EM | Admit: 2020-11-22 | Discharge: 2020-11-25 | DRG: 190 | Disposition: A | Discharge status: Home / Self Care | Payer: Medicare Other | Attending: Internal Medicine | Admitting: Internal Medicine

## 2020-11-22 DIAGNOSIS — I5032 Chronic diastolic (congestive) heart failure: Secondary | ICD-10-CM | POA: Diagnosis present

## 2020-11-22 DIAGNOSIS — K219 Gastro-esophageal reflux disease without esophagitis: Secondary | ICD-10-CM | POA: Diagnosis present

## 2020-11-22 DIAGNOSIS — E119 Type 2 diabetes mellitus without complications: Secondary | ICD-10-CM | POA: Diagnosis not present

## 2020-11-22 DIAGNOSIS — I10 Essential (primary) hypertension: Secondary | ICD-10-CM | POA: Diagnosis not present

## 2020-11-22 DIAGNOSIS — Z7982 Long term (current) use of aspirin: Secondary | ICD-10-CM | POA: Diagnosis not present

## 2020-11-22 DIAGNOSIS — Z880 Allergy status to penicillin: Secondary | ICD-10-CM

## 2020-11-22 DIAGNOSIS — F32A Depression, unspecified: Secondary | ICD-10-CM | POA: Diagnosis present

## 2020-11-22 DIAGNOSIS — Z86718 Personal history of other venous thrombosis and embolism: Secondary | ICD-10-CM

## 2020-11-22 DIAGNOSIS — M545 Low back pain, unspecified: Secondary | ICD-10-CM | POA: Diagnosis present

## 2020-11-22 DIAGNOSIS — Z90710 Acquired absence of both cervix and uterus: Secondary | ICD-10-CM

## 2020-11-22 DIAGNOSIS — Z7984 Long term (current) use of oral hypoglycemic drugs: Secondary | ICD-10-CM | POA: Diagnosis not present

## 2020-11-22 DIAGNOSIS — J440 Chronic obstructive pulmonary disease with acute lower respiratory infection: Secondary | ICD-10-CM | POA: Diagnosis present

## 2020-11-22 DIAGNOSIS — T380X5A Adverse effect of glucocorticoids and synthetic analogues, initial encounter: Secondary | ICD-10-CM | POA: Diagnosis present

## 2020-11-22 DIAGNOSIS — J441 Chronic obstructive pulmonary disease with (acute) exacerbation: Secondary | ICD-10-CM | POA: Diagnosis present

## 2020-11-22 DIAGNOSIS — F17210 Nicotine dependence, cigarettes, uncomplicated: Secondary | ICD-10-CM | POA: Diagnosis present

## 2020-11-22 DIAGNOSIS — E876 Hypokalemia: Secondary | ICD-10-CM | POA: Diagnosis present

## 2020-11-22 DIAGNOSIS — J189 Pneumonia, unspecified organism: Secondary | ICD-10-CM | POA: Diagnosis present

## 2020-11-22 DIAGNOSIS — I110 Hypertensive heart disease with heart failure: Secondary | ICD-10-CM | POA: Diagnosis present

## 2020-11-22 DIAGNOSIS — Z79899 Other long term (current) drug therapy: Secondary | ICD-10-CM

## 2020-11-22 DIAGNOSIS — Z86711 Personal history of pulmonary embolism: Secondary | ICD-10-CM

## 2020-11-22 DIAGNOSIS — H6981 Other specified disorders of Eustachian tube, right ear: Secondary | ICD-10-CM

## 2020-11-22 DIAGNOSIS — M1990 Unspecified osteoarthritis, unspecified site: Secondary | ICD-10-CM | POA: Diagnosis present

## 2020-11-22 DIAGNOSIS — M109 Gout, unspecified: Secondary | ICD-10-CM | POA: Diagnosis present

## 2020-11-22 DIAGNOSIS — E785 Hyperlipidemia, unspecified: Secondary | ICD-10-CM | POA: Diagnosis present

## 2020-11-22 DIAGNOSIS — J449 Chronic obstructive pulmonary disease, unspecified: Secondary | ICD-10-CM

## 2020-11-22 DIAGNOSIS — D179 Benign lipomatous neoplasm, unspecified: Secondary | ICD-10-CM

## 2020-11-22 DIAGNOSIS — G8929 Other chronic pain: Secondary | ICD-10-CM | POA: Diagnosis present

## 2020-11-22 DIAGNOSIS — Z20822 Contact with and (suspected) exposure to covid-19: Secondary | ICD-10-CM | POA: Diagnosis present

## 2020-11-22 DIAGNOSIS — E1165 Type 2 diabetes mellitus with hyperglycemia: Secondary | ICD-10-CM | POA: Diagnosis present

## 2020-11-22 DIAGNOSIS — Z886 Allergy status to analgesic agent status: Secondary | ICD-10-CM

## 2020-11-22 LAB — CBC WITH DIFFERENTIAL/PLATELET
Abs Immature Granulocytes: 0 10*3/uL (ref 0.00–0.07)
Basophils Absolute: 0.1 10*3/uL (ref 0.0–0.1)
Basophils Relative: 1 %
Eosinophils Absolute: 0.1 10*3/uL (ref 0.0–0.5)
Eosinophils Relative: 1 %
HCT: 40.2 % (ref 36.0–46.0)
Hemoglobin: 13.2 g/dL (ref 12.0–15.0)
Lymphocytes Relative: 45 %
Lymphs Abs: 2.9 10*3/uL (ref 0.7–4.0)
MCH: 26.7 pg (ref 26.0–34.0)
MCHC: 32.8 g/dL (ref 30.0–36.0)
MCV: 81.2 fL (ref 80.0–100.0)
Monocytes Absolute: 0.2 10*3/uL (ref 0.1–1.0)
Monocytes Relative: 3 %
Neutro Abs: 3.3 10*3/uL (ref 1.7–7.7)
Neutrophils Relative %: 50 %
Platelets: 269 10*3/uL (ref 150–400)
RBC: 4.95 MIL/uL (ref 3.87–5.11)
RDW: 15.6 % — ABNORMAL HIGH (ref 11.5–15.5)
WBC: 6.5 10*3/uL (ref 4.0–10.5)
nRBC: 0 % (ref 0.0–0.2)

## 2020-11-22 LAB — COMPREHENSIVE METABOLIC PANEL
ALT: 13 U/L (ref 0–44)
AST: 13 U/L — ABNORMAL LOW (ref 15–41)
Albumin: 3.8 g/dL (ref 3.5–5.0)
Alkaline Phosphatase: 118 U/L (ref 38–126)
Anion gap: 5 (ref 5–15)
BUN: 8 mg/dL (ref 8–23)
CO2: 31 mmol/L (ref 22–32)
Calcium: 9.3 mg/dL (ref 8.9–10.3)
Chloride: 103 mmol/L (ref 98–111)
Creatinine, Ser: 0.82 mg/dL (ref 0.44–1.00)
GFR, Estimated: >60 mL/min (ref >60)
Glucose, Bld: 161 mg/dL — ABNORMAL HIGH (ref 70–99)
Potassium: 3.4 mmol/L — ABNORMAL LOW (ref 3.5–5.1)
Sodium: 139 mmol/L (ref 135–145)
Total Bilirubin: 0.6 mg/dL (ref 0.3–1.2)
Total Protein: 7.4 g/dL (ref 6.5–8.1)

## 2020-11-22 LAB — GLUCOSE, CAPILLARY: Glucose-Capillary: 194 mg/dL — ABNORMAL HIGH (ref 70–99)

## 2020-11-22 LAB — BLOOD GAS, VENOUS
Acid-Base Excess: 5.4 mmol/L — ABNORMAL HIGH (ref 0.0–2.0)
Bicarbonate: 29.7 mmol/L — ABNORMAL HIGH (ref 20.0–28.0)
O2 Saturation: 98.9 %
Patient temperature: 98.6
pCO2, Ven: 44.1 mmHg (ref 44.0–60.0)
pH, Ven: 7.444 — ABNORMAL HIGH (ref 7.250–7.430)
pO2, Ven: 109 mmHg — ABNORMAL HIGH (ref 32.0–45.0)

## 2020-11-22 LAB — RESP PANEL BY RT-PCR (FLU A&B, COVID) ARPGX2
Influenza A by PCR: NEGATIVE
Influenza B by PCR: NEGATIVE
SARS Coronavirus 2 by RT PCR: NEGATIVE

## 2020-11-22 LAB — TROPONIN I (HIGH SENSITIVITY): Troponin I (High Sensitivity): 6 ng/L (ref <18)

## 2020-11-22 LAB — LACTIC ACID, PLASMA: Lactic Acid, Venous: 1.5 mmol/L (ref 0.5–1.9)

## 2020-11-22 LAB — D-DIMER, QUANTITATIVE: D-Dimer, Quant: 0.61 ug/mL-FEU — ABNORMAL HIGH (ref 0.00–0.50)

## 2020-11-22 MED ORDER — AMLODIPINE BESYLATE 5 MG PO TABS: 10.0000 mg | ORAL_TABLET | Freq: Every day | ORAL | Status: DC

## 2020-11-22 MED ORDER — ONDANSETRON HCL 4 MG PO TABS: 4.0000 mg | ORAL_TABLET | Freq: Four times a day (QID) | ORAL | Status: DC | PRN

## 2020-11-22 MED ORDER — ESCITALOPRAM OXALATE 10 MG PO TABS
10.0000 mg | ORAL_TABLET | Freq: Every day | ORAL | Status: DC
  Administered 2020-11-22 – 2020-11-25 (×4): 10 mg via ORAL
  Filled 2020-11-22 (×4): qty 1

## 2020-11-22 MED ORDER — ACETAMINOPHEN 325 MG PO TABS
650.0000 mg | ORAL_TABLET | Freq: Four times a day (QID) | ORAL | Status: DC | PRN
  Administered 2020-11-23: 650 mg via ORAL
  Filled 2020-11-22: qty 2

## 2020-11-22 MED ORDER — LISINOPRIL 5 MG PO TABS
5.0000 mg | ORAL_TABLET | Freq: Every morning | ORAL | Status: DC
  Administered 2020-11-23 – 2020-11-25 (×3): 5 mg via ORAL
  Filled 2020-11-22 (×3): qty 1

## 2020-11-22 MED ORDER — ENOXAPARIN SODIUM 40 MG/0.4ML IJ SOSY
40.0000 mg | PREFILLED_SYRINGE | INTRAMUSCULAR | Status: DC
  Filled 2020-11-22 (×2): qty 0.4

## 2020-11-22 MED ORDER — IPRATROPIUM-ALBUTEROL 0.5-2.5 (3) MG/3ML IN SOLN
3.0000 mL | Freq: Three times a day (TID) | RESPIRATORY_TRACT | Status: DC
  Administered 2020-11-23 – 2020-11-24 (×4): 3 mL via RESPIRATORY_TRACT
  Filled 2020-11-22 (×3): qty 3

## 2020-11-22 MED ORDER — AMLODIPINE BESYLATE 10 MG PO TABS
10.0000 mg | ORAL_TABLET | Freq: Every day | ORAL | Status: DC
  Administered 2020-11-23 – 2020-11-25 (×3): 10 mg via ORAL
  Filled 2020-11-22 (×3): qty 1

## 2020-11-22 MED ORDER — ONDANSETRON HCL 4 MG/2ML IJ SOLN: 4.0000 mg | Freq: Four times a day (QID) | INTRAMUSCULAR | Status: DC | PRN

## 2020-11-22 MED ORDER — POLYETHYL GLYCOL-PROPYL GLYCOL 0.4-0.3 % OP GEL: Freq: Three times a day (TID) | OPHTHALMIC | Status: DC

## 2020-11-22 MED ORDER — POLYVINYL ALCOHOL 1.4 % OP SOLN
1.0000 [drp] | Freq: Three times a day (TID) | OPHTHALMIC | Status: DC
  Administered 2020-11-23 – 2020-11-25 (×7): 1 [drp] via OPHTHALMIC
  Filled 2020-11-22: qty 15

## 2020-11-22 MED ORDER — ASPIRIN EC 81 MG PO TBEC: 81.0000 mg | DELAYED_RELEASE_TABLET | Freq: Every day | ORAL | Status: DC

## 2020-11-22 MED ORDER — PRAVASTATIN SODIUM 20 MG PO TABS: 20.0000 mg | ORAL_TABLET | Freq: Every day | ORAL | Status: DC

## 2020-11-22 MED ORDER — IPRATROPIUM-ALBUTEROL 0.5-2.5 (3) MG/3ML IN SOLN: 3.0000 mL | Freq: Four times a day (QID) | RESPIRATORY_TRACT | Status: DC

## 2020-11-22 MED ORDER — IPRATROPIUM-ALBUTEROL 0.5-2.5 (3) MG/3ML IN SOLN
3.0000 mL | RESPIRATORY_TRACT | Status: DC | PRN
  Administered 2020-11-22: 3 mL via RESPIRATORY_TRACT
  Filled 2020-11-22: qty 3

## 2020-11-22 MED ORDER — GUAIFENESIN-DM 100-10 MG/5ML PO SYRP
5.0000 mL | ORAL_SOLUTION | ORAL | Status: DC | PRN
  Administered 2020-11-23 – 2020-11-24 (×3): 5 mL via ORAL
  Filled 2020-11-22 (×4): qty 5

## 2020-11-22 MED ORDER — MOMETASONE FURO-FORMOTEROL FUM 200-5 MCG/ACT IN AERO
2.0000 | INHALATION_SPRAY | Freq: Two times a day (BID) | RESPIRATORY_TRACT | Status: DC
  Administered 2020-11-23 – 2020-11-25 (×5): 2 via RESPIRATORY_TRACT
  Filled 2020-11-22: qty 8.8

## 2020-11-22 MED ORDER — HYDROCODONE-ACETAMINOPHEN 5-325 MG PO TABS
1.0000 | ORAL_TABLET | ORAL | Status: DC | PRN
  Administered 2020-11-22 – 2020-11-24 (×4): 1 via ORAL
  Filled 2020-11-22 (×4): qty 1

## 2020-11-22 MED ORDER — NICOTINE 21 MG/24HR TD PT24
21.0000 mg | MEDICATED_PATCH | Freq: Once | TRANSDERMAL | Status: AC
  Administered 2020-11-22: 21 mg via TRANSDERMAL
  Filled 2020-11-22: qty 1

## 2020-11-22 MED ORDER — ACETAMINOPHEN 650 MG RE SUPP: 650.0000 mg | Freq: Four times a day (QID) | RECTAL | Status: DC | PRN

## 2020-11-22 MED ORDER — AZITHROMYCIN 250 MG PO TABS
500.0000 mg | ORAL_TABLET | Freq: Every day | ORAL | Status: DC
  Administered 2020-11-23 – 2020-11-25 (×3): 500 mg via ORAL
  Filled 2020-11-22 (×3): qty 2

## 2020-11-22 MED ORDER — INSULIN ASPART 100 UNIT/ML IJ SOLN
0.0000 [IU] | Freq: Three times a day (TID) | INTRAMUSCULAR | Status: DC
  Administered 2020-11-23: 8 [IU] via SUBCUTANEOUS
  Filled 2020-11-22: qty 0.15

## 2020-11-22 MED ORDER — CYCLOBENZAPRINE HCL 5 MG PO TABS
5.0000 mg | ORAL_TABLET | Freq: Three times a day (TID) | ORAL | Status: DC | PRN
  Administered 2020-11-23 – 2020-11-24 (×4): 5 mg via ORAL
  Filled 2020-11-22 (×4): qty 1

## 2020-11-22 MED ORDER — SODIUM CHLORIDE 0.9 % IV SOLN
1.0000 g | Freq: Once | INTRAVENOUS | Status: AC
  Administered 2020-11-22: 1 g via INTRAVENOUS
  Filled 2020-11-22: qty 10

## 2020-11-22 MED ORDER — ASPIRIN EC 81 MG PO TBEC
81.0000 mg | DELAYED_RELEASE_TABLET | Freq: Every day | ORAL | Status: DC
  Administered 2020-11-23 – 2020-11-25 (×3): 81 mg via ORAL
  Filled 2020-11-22 (×3): qty 1

## 2020-11-22 MED ORDER — METHYLPREDNISOLONE SODIUM SUCC 40 MG IJ SOLR
40.0000 mg | Freq: Two times a day (BID) | INTRAMUSCULAR | Status: DC
  Administered 2020-11-22 – 2020-11-23 (×2): 40 mg via INTRAVENOUS
  Filled 2020-11-22 (×2): qty 1

## 2020-11-22 MED ORDER — SODIUM CHLORIDE 0.9 % IV SOLN
500.0000 mg | Freq: Once | INTRAVENOUS | Status: AC
  Administered 2020-11-22: 500 mg via INTRAVENOUS
  Filled 2020-11-22: qty 500

## 2020-11-22 MED ORDER — PRAVASTATIN SODIUM 20 MG PO TABS
20.0000 mg | ORAL_TABLET | Freq: Every day | ORAL | Status: DC
  Administered 2020-11-23 – 2020-11-25 (×3): 20 mg via ORAL
  Filled 2020-11-22 (×3): qty 1

## 2020-11-22 MED ORDER — PANTOPRAZOLE SODIUM 40 MG PO TBEC
40.0000 mg | DELAYED_RELEASE_TABLET | Freq: Every day | ORAL | Status: DC
  Administered 2020-11-23 – 2020-11-25 (×3): 40 mg via ORAL
  Filled 2020-11-22 (×3): qty 1

## 2020-11-22 MED ORDER — FLUTICASONE PROPIONATE 50 MCG/ACT NA SUSP
2.0000 | Freq: Every day | NASAL | Status: DC
  Administered 2020-11-23 – 2020-11-25 (×3): 2 via NASAL
  Filled 2020-11-22: qty 16

## 2020-11-22 MED ORDER — LATANOPROST 0.005 % OP SOLN
1.0000 [drp] | Freq: Every day | OPHTHALMIC | Status: DC
  Administered 2020-11-22 – 2020-11-24 (×3): 1 [drp] via OPHTHALMIC
  Filled 2020-11-22: qty 2.5

## 2020-11-22 MED ORDER — ALLOPURINOL 100 MG PO TABS
100.0000 mg | ORAL_TABLET | Freq: Every day | ORAL | Status: DC
  Administered 2020-11-23 – 2020-11-25 (×3): 100 mg via ORAL
  Filled 2020-11-22 (×2): qty 1

## 2020-11-22 MED ORDER — HYDROCHLOROTHIAZIDE 25 MG PO TABS
25.0000 mg | ORAL_TABLET | Freq: Every day | ORAL | Status: DC
  Administered 2020-11-23 – 2020-11-25 (×3): 25 mg via ORAL
  Filled 2020-11-22 (×3): qty 1

## 2020-11-22 MED ORDER — LUBIPROSTONE 24 MCG PO CAPS
24.0000 ug | ORAL_CAPSULE | Freq: Every day | ORAL | Status: DC
  Administered 2020-11-24 – 2020-11-25 (×2): 24 ug via ORAL
  Filled 2020-11-22 (×3): qty 1

## 2020-11-22 MED ORDER — METHYLPREDNISOLONE SODIUM SUCC 40 MG IJ SOLR: 40.0000 mg | Freq: Two times a day (BID) | INTRAMUSCULAR | Status: DC

## 2020-11-22 NOTE — ED Notes (Signed)
Pt refusing to have blood drawn, states she just wants to know what is wrong.

## 2020-11-22 NOTE — H&P (Signed)
History and Physical    Sarah [name] KTG:[id number] DOB: 12/07/1955 DOA: 11/22/2020  PCP: [name], FNP   Patient coming from: Home   Chief Complaint:  Cough and chest pain.   HPI: Sarah [name] is a 65 y.o. female with medical history significant of type 2 diabetes mellitus, hypertension, DVT/PE, pancreatitis, GERD and COPD who presented with cough and chest pain.   She reports 2 weeks of worsening cough, productive in nature, associated with nausea and vomiting, no improving or worsening factors.  Over the last 7 days she has been experiencing chest pain, mainly with coughing, moderate in intensity with acute worsening over the last 24 hours that prompted her to call her primary care provider and she was advised to come to the hospital for further evaluation.  No fever or chills, no sick contacts.   Patient continues to smoke cigarettes.   ED Course: In the emergency department she has been diagnosed with committee acquired pneumonia, she received antibiotic therapy and was referred for admission for evaluation.  Review of Systems:  1. General: No fevers, no chills, no weight gain or weight loss 2. ENT: No runny nose or sore throat, no hearing disturbances 3. Pulmonary: No dyspnea, but positive cough, wheezing, no  hemoptysis 4. Cardiovascular: No angina, claudication, lower extremity edema, pnd or orthopnea 5. Gastrointestinal: positive nausea and vomiting, no diarrhea or constipation 6. Hematology: No easy bruisability or frequent infections 7. Urology: No dysuria, hematuria or increased urinary frequency 8. Dermatology: No rashes. 9. Neurology: No seizures or paresthesias 10. Musculoskeletal: No joint pain or deformities  Past Medical History:  Diagnosis Date  . Acute pancreatitis    01-13-2015 per ED note  . Adrenal adenoma    bilateral  . Arthritis   . Bilateral lower extremity edema   . Complication of anesthesia    post-op 07-20-2014 surgery post-op Hypoxia  due to acute CHF/ mild Pulmonary edema  . COPD (chronic obstructive pulmonary disease) ([place])   . Gastritis    01-13-2015  per ED note  . GERD (gastroesophageal reflux disease)   . Headache   . History of acute congestive heart failure    07-22-2014   post-op surgery 07-20-2014  . History of acute pancreatitis    01-04-2013   . History of alcohol abuse    state quit 2002/  drank daily >fifth of gin  . History of DVT of lower extremity    2000  post op left knee surgery  . History of pulmonary edema    mild -  07-22-2014  post-op  surgery 07-20-2014  . Hypertension   . Type 2 diabetes mellitus ([place])   . Ulnar nerve compression    right elbow    Past Surgical History:  Procedure Laterality Date  . DILATION AND CURETTAGE OF UTERUS    . INSERTION OF MESH N/A 07/20/2014   Procedure: INSERTION OF MESH;  Surgeon: Fanny Skates, MD;  Location: [facility];  Service: General;  Laterality: N/A;  . KNEE ARTHROSCOPY Left 2000  . TOTAL ABDOMINAL HYSTERECTOMY W/ BILATERAL SALPINGOOPHORECTOMY  1995  . TRANSTHORACIC ECHOCARDIOGRAM  07-22-2014   mild LVH,  60-65%,  grade I diastolic dysfunction/  mild LAE  . VENTRAL HERNIA REPAIR N/A 07/20/2014   Procedure: LAPAROSCOPIC REPAIR INCARCARATED VENTRAL HERNIA AND UMBILICAL HERNIA;  Surgeon: Fanny Skates, MD;  Location: [facility];  Service: General;  Laterality: N/A;     reports that she has been smoking cigarettes. She has a 60.00 pack-year smoking  history. She has never used smokeless tobacco. She reports that she does not drink alcohol and does not use drugs.  Allergies  Allergen Reactions  . Aspirin Other (See Comments)    Causes her stomach pain.  . Ibuprofen Other (See Comments)    Causes stomach pain  . Penicillins Other (See Comments)    Localized whelps and redness after injection Has patient had a PCN reaction causing immediate rash, facial/tongue/throat swelling, SOB or lightheadedness with hypotension: No Has patient had a PCN reaction  causing severe rash involving mucus membranes or skin necrosis: Yes Has patient had a PCN reaction that required hospitalization No Has patient had a PCN reaction occurring within the last 10 years: No If all of the above answers are "NO", then may proceed with Cephalosporin use.     No family history on file.   Prior to Admission medications   Medication Sig Start Date End Date Taking? Authorizing Provider  albuterol (PROAIR HFA) 108 (90 Base) MCG/ACT inhaler INHALE 2 PUFFS BY MOUTH EVERY 4 HOURS AS NEEDED FOR WHEEZING OR SHORTNESS OF BREATH Patient taking differently: Inhale 2 puffs into the lungs every 4 (four) hours as needed for wheezing or shortness of breath. 12/21/17  Yes Ladell Pier, MD  allopurinol (ZYLOPRIM) 300 MG tablet Take 300 mg by mouth daily. 09/27/20  Yes [provider]  amLODipine (NORVASC) 10 MG tablet Take 1 tablet (10 mg total) by mouth daily. 12/21/17  Yes Ladell Pier, MD  aspirin EC 81 MG tablet Take 1 tablet (81 mg total) by mouth daily. 07/02/16  Yes Langeland, Dawn T, MD  cyclobenzaprine (FLEXERIL) 5 MG tablet Take 5 mg by mouth 3 (three) times daily as needed for muscle spasms. 04/27/19  Yes [provider]  glimepiride (AMARYL) 1 MG tablet Take 1 tablet (1 mg total) by mouth daily with breakfast. Patient taking differently: Take 1 mg by mouth 2 (two) times daily. 12/21/17  Yes Ladell Pier, MD  guaiFENesin (ROBITUSSIN) 100 MG/5ML liquid Take 200 mg by mouth 3 (three) times daily as needed for cough.   Yes [provider]  hydrochlorothiazide (HYDRODIURIL) 25 MG tablet Take 1 tablet (25 mg total) by mouth daily. 10/28/17  Yes Ladell Pier, MD  lisinopril (PRINIVIL,ZESTRIL) 5 MG tablet Take 1 tablet (5 mg total) by mouth every morning. 11/19/17  Yes Ladell Pier, MD  lubiprostone (AMITIZA) 24 MCG capsule Take 24 mcg by mouth daily as needed for constipation. 09/27/20  Yes [provider]  LUMIGAN 0.01 % SOLN  Place 1 drop into both eyes at bedtime. 07/26/20  Yes [provider]  metFORMIN (GLUCOPHAGE) 1000 MG tablet Take 1,000 mg by mouth 2 (two) times daily with a meal.   Yes [provider]  pravastatin (PRAVACHOL) 20 MG tablet Take 1 tablet (20 mg total) by mouth daily. 08/12/17  Yes Ladell Pier, MD  allopurinol (ZYLOPRIM) 100 MG tablet Take 1 tablet (100 mg total) by mouth daily. Patient not taking: Reported on 11/22/2020 12/21/17   Ladell Pier, MD  blood glucose meter kit and supplies KIT Libra Freestyle meter 03/14/17   Ladell Pier, MD  Blood Glucose Monitoring Suppl (ONE TOUCH ULTRA MINI) w/Device KIT Use as directed to test blood sugar once daily. DX code E11.9 03/12/18   Ladell Pier, MD  glucose blood (ONE TOUCH ULTRA TEST) test strip Use as directed to test blood sugar once daily. DX code E11.9 03/12/18   Ladell Pier,  MD  metFORMIN (GLUCOPHAGE) 500 MG tablet take 1 tablet by mouth once daily with BREAKFAST Patient not taking: Reported on 11/22/2020 12/21/17   Ladell Pier, MD  [facility] DELICA LANCETS 16W MISC Use as directed to test blood sugar once daily. DX code E11.9 03/12/18   Ladell Pier, MD    Physical Exam: Vitals:   11/22/20 1427 11/22/20 1515  BP: (!) 164/85 (!) 154/71  Pulse: 73 79  Resp: 18 (!) 26  Temp: 98.2 F (36.8 C)   TempSrc: Oral   SpO2: 93% 96%    Vitals:   11/22/20 1427 11/22/20 1515  BP: (!) 164/85 (!) 154/71  Pulse: 73 79  Resp: 18 (!) 26  Temp: 98.2 F (36.8 C)   TempSrc: Oral   SpO2: 93% 96%   General: deconditioned and ill looking appearing, Neurology: Awake and alert, non focal Head and Neck. Head normocephalic. Neck supple with no adenopathy or thyromegaly.   E ENT: no pallor, no icterus, oral mucosa moist Cardiovascular: No JVD. S1-S2 present, rhythmic, no gallops, rubs, or murmurs. Trace non piting lower extremity edema. Pulmonary: positive breath sounds bilaterally, decreased air movement,  positive expiratory wheezing, no rhonchi, mild bilateral rales. Gastrointestinal. Abdomen soft, mild tender to deep palpation at the epigastrium Skin. Left upper chest lipoma, non tender.  Musculoskeletal: no joint deformities    Labs on Admission: I have personally reviewed following labs and imaging studies  CBC: Recent Labs  Lab 11/22/20 1430  WBC 6.5  NEUTROABS 3.3  HGB 13.2  HCT 40.2  MCV 81.2  PLT 109   Basic Metabolic Panel: Recent Labs  Lab 11/22/20 1430  NA 139  K 3.4*  CL 103  CO2 31  GLUCOSE 161*  BUN 8  CREATININE 0.82  CALCIUM 9.3   GFR: CrCl cannot be calculated (Unknown ideal weight.). Liver Function Tests: Recent Labs  Lab 11/22/20 1430  AST 13*  ALT 13  ALKPHOS 118  BILITOT 0.6  PROT 7.4  ALBUMIN 3.8   No results for input(s): LIPASE, AMYLASE in the last 168 hours. No results for input(s): AMMONIA in the last 168 hours. Coagulation Profile: No results for input(s): INR, PROTIME in the last 168 hours. Cardiac Enzymes: No results for input(s): CKTOTAL, CKMB, CKMBINDEX, TROPONINI in the last 168 hours. BNP (last 3 results) No results for input(s): PROBNP in the last 8760 hours. HbA1C: No results for input(s): HGBA1C in the last 72 hours. CBG: No results for input(s): GLUCAP in the last 168 hours. Lipid Profile: No results for input(s): CHOL, HDL, LDLCALC, TRIG, CHOLHDL, LDLDIRECT in the last 72 hours. Thyroid Function Tests: No results for input(s): TSH, T4TOTAL, FREET4, T3FREE, THYROIDAB in the last 72 hours. Anemia Panel: No results for input(s): VITAMINB12, FOLATE, FERRITIN, TIBC, IRON, RETICCTPCT in the last 72 hours. Urine analysis:    Component Value Date/Time   COLORURINE YELLOW 11/06/2017 0420   APPEARANCEUR HAZY (A) 11/06/2017 0420   LABSPEC 1.023 11/06/2017 0420   PHURINE 5.0 11/06/2017 0420   GLUCOSEU NEGATIVE 11/06/2017 0420   HGBUR NEGATIVE 11/06/2017 0420   BILIRUBINUR NEGATIVE 11/06/2017 0420   KETONESUR NEGATIVE  11/06/2017 0420   PROTEINUR NEGATIVE 11/06/2017 0420   UROBILINOGEN 1.0 07/01/2014 1016   NITRITE NEGATIVE 11/06/2017 0420   LEUKOCYTESUR NEGATIVE 11/06/2017 0420    Radiological Exams on Admission: DG Chest 2 View  Result Date: 11/22/2020 CLINICAL DATA:  Chest pain and cough.  Lethargy. EXAM: CHEST - 2 VIEW COMPARISON:  04/25/2020 FINDINGS: Borderline enlarged cardiac silhouette  with a mild decrease in size. Interval mild patchy opacity at both lung bases, left greater than right. No significant change in diffuse prominence of the pulmonary vasculature and interstitial markings. No pleural fluid. Thoracic spine degenerative changes. IMPRESSION: 1. Interval patchy atelectasis or pneumonia at both lung bases. 2. Stable pulmonary vascular congestion and chronic interstitial lung disease. Electronically Signed   By: Claudie Revering M.D.   On: 11/22/2020 14:55    EKG: Independently reviewed.   Assessment/Plan Principal Problem:   COPD with acute exacerbation (HCC) Active Problems:   LOW BACK PAIN   HTN (hypertension)   Diabetes type 2, controlled (HCC)   GERD (gastroesophageal reflux disease)   Lipoma   Smokes 2 packs of cigarettes per day   65 year old female with history of hypertension, type 2 diabetes mellitus and COPD who presents with a 2-week history of worsening productive cough that has been complicated with chest pain, nausea and vomiting.  On her physical examination, blood pressure 154/71, heart rate 79, respiratory 26, oxygen saturation 96% on room air.  Her lung examination had expiratory wheeze, mild rales at bases, heart S1-S2, present rhythmic, epigastric abdominal pain to palpation, nonpitting lower extremity edema.  Venous pH 7.44, sodium 139, potassium 3.4, chloride 103, bicarb 31, glucose 161, BUN 8, creatinine 0.2, white count 6.5, hemoglobin 13.2, hematocrit 40.2, platelets 269. SARS COVID-19 negative.  Chest radiograph with by lateral atelectasis at bases.  EKG 72  bpm, normal axis, normal intervals, sinus rhythm, no significant ST segment or T wave changes.  Mrs. Tora Perches is being admitted to the hospital with a working diagnosis of acute COPD exacerbation  1.  COPD exacerbation.  Patient will be admitted to the medical ward, continue oximetry and supplemental oxygen per nasal cannula as needed to keep oxygen saturation greater than 88% Aggressive bronchodilator therapy with DuoNeb, scheduled and as needed, antitussive agents and airway clearing techniques. Antibiotic therapy with azithromycin 500 mg for 5 doses. Systemic steroids with methylprednisolone 40 mg twice daily and inhaled corticosteroids.  2.  Hypertension.  Continue blood pressure control with amlodipine, hydrochlorothiazide and lisinopril.  3.  Type 2 diabetes mellitus/dyslipidemia.  Continue glucose control and monitoring with insulin sliding scale. For now hold on oral hypoglycemic agents, patient at home on glimepiride and metformin.  Pravastatin.  4.  Gout.  No acute flare, continue allopurinol.  5.  Chronic back pain/ depression.  Continue Flexeril and hydrocodone. Continue with escitalopram.   6. GERD. Continue antiacid therapy with pantoprazole.   Status is: Inpatient  Remains inpatient appropriate because:IV treatments appropriate due to intensity of illness or inability to take PO   Dispo: The patient is from: Home              Anticipated d/c is to: Home              Patient currently is not medically stable to d/c.   Difficult to place patient No   DVT prophylaxis: Enoxaparin   Code Status:   full  Family Communication:  No family at the bedside     Consults called:  None   Admission status:  Inpatient.    Kamal Jurgens Gerome Apley MD [facility]   11/22/2020, 5:26 PM

## 2020-11-22 NOTE — ED Triage Notes (Signed)
Per EMS, pt called her PCP, who called EMS d/t patient sounding short of breath and lethargic. Pt is concerned she has pneuomonia but does not feel lethargic. Pt's family reports she is not usually lethargic as she is today   148/72 HR 80 18 O2 100% EtCO2 45 CBG 245 12 lead NSR

## 2020-11-22 NOTE — ED Notes (Signed)
Pt finally let writer get bloodwork.

## 2020-11-22 NOTE — ED Provider Notes (Signed)
Emergency Medicine Provider Triage Evaluation Note  Sarah [name] , a 65 y.o. female  was evaluated in triage.  Pt complains of altered mental status, cough, chest pain, breath.  EMS report given narrow patient was very somnolent and would not speak or respond to questions but was able to get up.  Here patient states that she called her PCP because she thinks she has pneumonia.  PCP did not think she sounded right so sent EMS out to the house.  Grandkids reported that she is not usually this somnolent.  Review of Systems  Positive: Cough, chest pain, shortness of breath, confusion Negative: Fever, abdominal pain, emesis  Physical Exam  BP (!) 164/85 (BP Location: Right Arm)   Pulse 73   Temp 98.2 F (36.8 C) (Oral)   Resp 18   SpO2 93%  Gen:   Lethargic but now more alert and will answer questions Resp:  Normal effort, some wheezing noted MSK:   Moves extremities without difficulty  Other:    Medical Decision Making  Medically screening exam initiated at 2:14 PM.  Appropriate orders placed.  Sarah [name] was informed that the remainder of the evaluation will be completed by another provider, this initial triage assessment does not replace that evaluation, and the importance of remaining in the ED until their evaluation is complete.  Patient is somnolent, will need acute bed for immediate evaluation.   Jacqlyn Larsen, PA-C 11/22/20 [place], Ankit, MD 11/22/20 1547

## 2020-11-22 NOTE — ED Provider Notes (Signed)
[facility] DEPT Provider Note   CSN: [id number] Arrival date & time: 11/22/20  1409     History Chief Complaint  Patient presents with  . Shortness of Breath    Sarah MAHLANI BERNINGER is a 65 y.o. female.  She was brought in by EMS after family called them because she has been more lethargic over the last few days.  She started with a cough a week ago she said it was green initially now it is just white.  She has chest pain when she coughs.  She feels short of breath and more tired.  She is a smoker.  She says this is how she feels when she has pneumonia.  The history is provided by the patient.  Shortness of Breath Severity:  Moderate Onset quality:  Gradual Duration:  1 week Timing:  Intermittent Progression:  Unchanged Chronicity:  New Context: activity   Relieved by:  Nothing Worsened by:  Activity and coughing Ineffective treatments:  None tried Associated symptoms: chest pain, cough, sputum production and vomiting (with cough)   Associated symptoms: no abdominal pain, no fever, no headaches, no hemoptysis, no neck pain, no rash and no sore throat   Risk factors: tobacco use        Past Medical History:  Diagnosis Date  . Acute pancreatitis    01-13-2015 per ED note  . Adrenal adenoma    bilateral  . Arthritis   . Bilateral lower extremity edema   . Complication of anesthesia    post-op 07-20-2014 surgery post-op Hypoxia due to acute CHF/ mild Pulmonary edema  . COPD (chronic obstructive pulmonary disease) ([place])   . Gastritis    01-13-2015  per ED note  . GERD (gastroesophageal reflux disease)   . Headache   . History of acute congestive heart failure    07-22-2014   post-op surgery 07-20-2014  . History of acute pancreatitis    01-04-2013   . History of alcohol abuse    state quit 2002/  drank daily >fifth of gin  . History of DVT of lower extremity    2000  post op left knee surgery  . History of pulmonary edema    mild -   07-22-2014  post-op  surgery 07-20-2014  . Hypertension   . Type 2 diabetes mellitus ([place])   . Ulnar nerve compression    right elbow    Patient Active Problem List   Diagnosis Date Noted  . Sudden right hearing loss 03/20/2018  . Dental infection 12/09/2017  . Smokes 2 packs of cigarettes per day 12/09/2017  . Adrenal adenoma 11/08/2017  . Lipoma 01/10/2017  . Gum disease 01/10/2017  . DM neuropathy, type II diabetes mellitus ([place]) 12/12/2015  . GERD (gastroesophageal reflux disease) 03/02/2015  . Abdominal wall hernia 01/05/2013  . Diabetes type 2, controlled ([place])   . HTN (hypertension) 06/27/2011  . TOBACCO ABUSE 02/05/2007  . CONSTIPATION NOS 02/05/2007  . OSTEOARTHROSIS, GENERALIZED, MULTIPLE SITES 02/05/2007  . LOW BACK PAIN 02/05/2007    Past Surgical History:  Procedure Laterality Date  . DILATION AND CURETTAGE OF UTERUS    . INSERTION OF MESH N/A 07/20/2014   Procedure: INSERTION OF MESH;  Surgeon: Fanny Skates, MD;  Location: [facility];  Service: General;  Laterality: N/A;  . KNEE ARTHROSCOPY Left 2000  . TOTAL ABDOMINAL HYSTERECTOMY W/ BILATERAL SALPINGOOPHORECTOMY  1995  . TRANSTHORACIC ECHOCARDIOGRAM  07-22-2014   mild LVH,  60-65%,  grade I diastolic dysfunction/  mild LAE  .  VENTRAL HERNIA REPAIR N/A 07/20/2014   Procedure: LAPAROSCOPIC REPAIR INCARCARATED VENTRAL HERNIA AND UMBILICAL HERNIA;  Surgeon: Fanny Skates, MD;  Location: [facility];  Service: General;  Laterality: N/A;     OB History   No obstetric history on file.     No family history on file.  Social History   Tobacco Use  . Smoking status: Current Every Day Smoker    Packs/day: 1.50    Years: 40.00    Pack years: 60.00    Types: Cigarettes  . Smokeless tobacco: Never Used  Vaping Use  . Vaping Use: Never used  Substance Use Topics  . Alcohol use: No    Comment: hx alcohol abuse- drank >fifth of gin daily/  states Quit 2002  . Drug use: No    Home Medications Prior to Admission  medications   Medication Sig Start Date End Date Taking? Authorizing Provider  albuterol (PROAIR HFA) 108 (90 Base) MCG/ACT inhaler INHALE 2 PUFFS BY MOUTH EVERY 4 HOURS AS NEEDED FOR WHEEZING OR SHORTNESS OF BREATH 12/21/17   Ladell Pier, MD  albuterol (PROVENTIL) (2.5 MG/3ML) 0.083% nebulizer solution Take 3 mLs (2.5 mg total) by nebulization every 6 (six) hours as needed for wheezing or shortness of breath. 04/25/20   Raylene Everts, MD  allopurinol (ZYLOPRIM) 100 MG tablet Take 1 tablet (100 mg total) by mouth daily. 12/21/17   Ladell Pier, MD  amLODipine (NORVASC) 10 MG tablet Take 1 tablet (10 mg total) by mouth daily. 12/21/17   Ladell Pier, MD  aspirin EC 81 MG tablet Take 1 tablet (81 mg total) by mouth daily. 07/02/16   Langeland, Leda Quail, MD  azithromycin (ZITHROMAX) 250 MG tablet Take 1 tablet (250 mg total) by mouth daily. Take first 2 tablets together, then 1 every day until finished. 04/25/20   Raylene Everts, MD  blood glucose meter kit and supplies KIT Libra Freestyle meter 03/14/17   Ladell Pier, MD  Blood Glucose Monitoring Suppl (ONE TOUCH ULTRA MINI) w/Device KIT Use as directed to test blood sugar once daily. DX code E11.9 03/12/18   Ladell Pier, MD  budesonide-formoterol [facility]) 160-4.5 MCG/ACT inhaler Inhale 2 puffs into the lungs 2 (two) times daily. 01/14/17   Ladell Pier, MD  cyclobenzaprine (FLEXERIL) 5 MG tablet Take 5 mg by mouth 3 (three) times daily as needed. 04/27/19   [provider]  escitalopram (LEXAPRO) 10 MG tablet Take 10 mg by mouth daily. 04/27/19   [provider]  famotidine (PEPCID) 20 MG tablet Take 20 mg by mouth 2 (two) times daily. 04/25/19   [provider]  fluticasone (FLONASE) 50 MCG/ACT nasal spray Place 2 sprays into both nostrils daily. 01/30/18   Argentina Donovan, PA-C  glimepiride (AMARYL) 1 MG tablet Take 1 tablet (1 mg total) by mouth daily with breakfast. 12/21/17   Ladell Pier, MD  glucose blood (ONE TOUCH ULTRA TEST) test strip Use as directed to test blood sugar once daily. DX code E11.9 03/12/18   Ladell Pier, MD  hydrochlorothiazide (HYDRODIURIL) 25 MG tablet Take 1 tablet (25 mg total) by mouth daily. 10/28/17   Ladell Pier, MD  HYDROcodone-acetaminophen (NORCO/VICODIN) 5-325 MG tablet Take 1 tablet by mouth every 6 (six) hours as needed for moderate pain.    [provider]  ipratropium (ATROVENT HFA) 17 MCG/ACT inhaler Inhale 2 puffs into the lungs every 6 (six) hours as needed for wheezing. 06/30/15  Funches, Josalyn, MD  ipratropium (ATROVENT) 0.02 % nebulizer solution Take 2.5 mLs (0.5 mg total) by nebulization 4 (four) times daily. 04/25/20   Raylene Everts, MD  lisinopril (PRINIVIL,ZESTRIL) 5 MG tablet Take 1 tablet (5 mg total) by mouth every morning. 11/19/17   Ladell Pier, MD  meloxicam (MOBIC) 15 MG tablet Take 1 tablet (15 mg total) by mouth daily. 09/20/17   Ladell Pier, MD  metFORMIN (GLUCOPHAGE) 500 MG tablet take 1 tablet by mouth once daily with BREAKFAST 12/21/17   Ladell Pier, MD  [facility] DELICA LANCETS 17C MISC Use as directed to test blood sugar once daily. DX code E11.9 03/12/18   Ladell Pier, MD  Polyethyl Glycol-Propyl Glycol (SYSTANE OP) Place 1 drop into both eyes 3 (three) times daily.    [provider]  pravastatin (PRAVACHOL) 20 MG tablet Take 1 tablet (20 mg total) by mouth daily. 08/12/17   Ladell Pier, MD  predniSONE (DELTASONE) 20 MG tablet Take 1 tablet (20 mg total) by mouth 2 (two) times daily with a meal. 04/25/20   Raylene Everts, MD  predniSONE (STERAPRED UNI-PAK 21 TAB) 10 MG (21) TBPK tablet FPD 12/11/18   [provider]  ranitidine (ZANTAC) 150 MG tablet Take 1 tablet (150 mg total) by mouth 2 (two) times daily. Patient taking differently: Take 150 mg by mouth daily.  09/20/17   Ladell Pier, MD  terbinafine (LAMISIL AT) 1 % cream Apply 1  application topically 2 (two) times daily. 12/21/17   Ladell Pier, MD    Allergies    Aspirin, Ibuprofen, and Penicillins  Review of Systems   Review of Systems  Constitutional: Positive for fatigue. Negative for fever.  HENT: Negative for sore throat.   Eyes: Negative for visual disturbance.  Respiratory: Positive for cough, sputum production and shortness of breath. Negative for hemoptysis.   Cardiovascular: Positive for chest pain.  Gastrointestinal: Positive for vomiting (with cough). Negative for abdominal pain.  Genitourinary: Negative for dysuria.  Musculoskeletal: Negative for neck pain.  Skin: Negative for rash.  Neurological: Negative for headaches.    Physical Exam Updated Vital Signs BP (!) 154/71   Pulse 79   Temp 98.2 F (36.8 C) (Oral)   Resp (!) 26   SpO2 96%   Physical Exam Vitals and nursing note reviewed.  Constitutional:      General: She is not in acute distress.    Appearance: She is well-developed.  HENT:     Head: Normocephalic and atraumatic.  Eyes:     Conjunctiva/sclera: Conjunctivae normal.  Cardiovascular:     Rate and Rhythm: Normal rate and regular rhythm.     Heart sounds: No murmur heard.   Pulmonary:     Effort: Pulmonary effort is normal. No respiratory distress.     Breath sounds: Normal breath sounds.  Abdominal:     Palpations: Abdomen is soft.     Tenderness: There is no abdominal tenderness. There is no guarding or rebound.  Musculoskeletal:        General: Normal range of motion.     Cervical back: Neck supple.  Skin:    General: Skin is warm and dry.     Capillary Refill: Capillary refill takes less than 2 seconds.  Neurological:     General: No focal deficit present.     Mental Status: She is alert.     ED Results / Procedures / Treatments   Labs (all labs ordered are  listed, but only abnormal results are displayed) Labs Reviewed  COMPREHENSIVE METABOLIC PANEL - Abnormal; Notable for the following  components:      Result Value   Potassium 3.4 (*)    Glucose, Bld 161 (*)    AST 13 (*)    All other components within normal limits  CBC WITH DIFFERENTIAL/PLATELET - Abnormal; Notable for the following components:   RDW 15.6 (*)    All other components within normal limits  D-DIMER, QUANTITATIVE - Abnormal; Notable for the following components:   D-Dimer, Quant 0.61 (*)    All other components within normal limits  BLOOD GAS, VENOUS - Abnormal; Notable for the following components:   pH, Ven 7.444 (*)    pO2, Ven 109.0 (*)    Bicarbonate 29.7 (*)    Acid-Base Excess 5.4 (*)    All other components within normal limits  RESP PANEL BY RT-PCR (FLU A&B, COVID) ARPGX2  LACTIC ACID, PLASMA  LACTIC ACID, PLASMA  TROPONIN I (HIGH SENSITIVITY)  TROPONIN I (HIGH SENSITIVITY)    EKG EKG Interpretation  Date/Time:  Tuesday November 22 2020 15:28:18 EDT Ventricular Rate:  72 PR Interval:  160 QRS Duration: 88 QT Interval:  418 QTC Calculation: 458 R Axis:   64 Text Interpretation: Sinus rhythm No significant change since Confirmed by Aletta Edouard [id number]) on 11/22/2020 4:16:32 PM   Radiology DG Chest 2 View  Result Date: 11/22/2020 CLINICAL DATA:  Chest pain and cough.  Lethargy. EXAM: CHEST - 2 VIEW COMPARISON:  04/25/2020 FINDINGS: Borderline enlarged cardiac silhouette with a mild decrease in size. Interval mild patchy opacity at both lung bases, left greater than right. No significant change in diffuse prominence of the pulmonary vasculature and interstitial markings. No pleural fluid. Thoracic spine degenerative changes. IMPRESSION: 1. Interval patchy atelectasis or pneumonia at both lung bases. 2. Stable pulmonary vascular congestion and chronic interstitial lung disease. Electronically Signed   By: Claudie Revering M.D.   On: 11/22/2020 14:55    Procedures Procedures   Medications Ordered in ED Medications  nicotine (NICODERM CQ - dosed in mg/24 hours) patch 21 mg (21 mg  Transdermal Patch Applied 11/22/20 1654)  cefTRIAXone (ROCEPHIN) 1 g in sodium chloride 0.9 % 100 mL IVPB (0 g Intravenous Stopped 11/22/20 1648)  azithromycin (ZITHROMAX) 500 mg in sodium chloride 0.9 % 250 mL IVPB (500 mg Intravenous New Bag/Given 11/22/20 1648)    ED Course  I have reviewed the triage vital signs and the nursing notes.  Pertinent labs & imaging results that were available during my care of the patient were reviewed by me and considered in my medical decision making (see chart for details).  Clinical Course as of 11/22/20 [place] Nov 22, 2020  1636 Discussed with [facility] hospitalist Dr. Cathlean Sauer who will evaluate the patient for admission [MB]    Clinical Course User Index [MB] Hayden Rasmussen, MD   MDM Rules/Calculators/A&P                          This patient complains of cough productive of white sputum, shortness of breath, lethargy, some nausea and vomiting, chest pain with cough; this involves an extensive number of treatment Options and is a complaint that carries with it a high risk of complications and Morbidity. The differential includes pneumonia, COPD, CHF, ACS, pneumothorax, vascular, PE  I ordered, reviewed and interpreted labs, which included CBC with normal white count normal hemoglobin, chemistries fairly normal other  than elevated glucose, D-dimer mildly elevated although age-adjusted normal, lactate not elevated, VBG without significant retention I ordered medication IV antibiotics and transdermal nicotine patch I ordered imaging studies which included chest x-ray and I independently    visualized and interpreted imaging which showed bilateral atelectasis versus infiltrates Previous records obtained and reviewed in epic, has been treated for pneumonia or bronchitis multiple times in the past. I consulted [facility] hospitalist Dr. Cathlean Sauer and discussed lab and imaging findings  Critical Interventions: None  After the interventions stated above, I  reevaluated the patient and found patient to be satting well although still symptomatic with some lethargy and cough.  She is agreeable to admission to the hospital for further management of her symptoms.  Final Clinical Impression(s) / ED Diagnoses Final diagnoses:  Community acquired pneumonia, unspecified laterality    Rx / DC Orders ED Discharge Orders    None       Hayden Rasmussen, MD 11/22/20 [phone]

## 2020-11-23 LAB — BASIC METABOLIC PANEL
Anion gap: 10 (ref 5–15)
BUN: 12 mg/dL (ref 8–23)
CO2: 27 mmol/L (ref 22–32)
Calcium: 9.4 mg/dL (ref 8.9–10.3)
Chloride: 100 mmol/L (ref 98–111)
Creatinine, Ser: 0.79 mg/dL (ref 0.44–1.00)
GFR, Estimated: >60 mL/min (ref >60)
Glucose, Bld: 342 mg/dL — ABNORMAL HIGH (ref 70–99)
Potassium: 3.3 mmol/L — ABNORMAL LOW (ref 3.5–5.1)
Sodium: 137 mmol/L (ref 135–145)

## 2020-11-23 LAB — GLUCOSE, CAPILLARY
Glucose-Capillary: 293 mg/dL — ABNORMAL HIGH (ref 70–99)
Glucose-Capillary: 286 mg/dL — ABNORMAL HIGH (ref 70–99)
Glucose-Capillary: 314 mg/dL — ABNORMAL HIGH (ref 70–99)
Glucose-Capillary: 271 mg/dL — ABNORMAL HIGH (ref 70–99)

## 2020-11-23 LAB — TROPONIN I (HIGH SENSITIVITY): Troponin I (High Sensitivity): 5 ng/L (ref <18)

## 2020-11-23 LAB — CBC
HCT: 40.1 % (ref 36.0–46.0)
Hemoglobin: 13.3 g/dL (ref 12.0–15.0)
MCH: 27 pg (ref 26.0–34.0)
MCHC: 33.2 g/dL (ref 30.0–36.0)
MCV: 81.3 fL (ref 80.0–100.0)
Platelets: 277 10*3/uL (ref 150–400)
RBC: 4.93 MIL/uL (ref 3.87–5.11)
RDW: 15.6 % — ABNORMAL HIGH (ref 11.5–15.5)
WBC: 6.8 10*3/uL (ref 4.0–10.5)
nRBC: 0 % (ref 0.0–0.2)

## 2020-11-23 LAB — LACTIC ACID, PLASMA
Lactic Acid, Venous: 2.8 mmol/L (ref 0.5–1.9)
Lactic Acid, Venous: 3.3 mmol/L (ref 0.5–1.9)
Lactic Acid, Venous: 2.3 mmol/L (ref 0.5–1.9)

## 2020-11-23 LAB — HIV ANTIBODY (ROUTINE TESTING W REFLEX): HIV Screen 4th Generation wRfx: NONREACTIVE

## 2020-11-23 MED ORDER — INSULIN ASPART 100 UNIT/ML IJ SOLN
0.0000 [IU] | Freq: Three times a day (TID) | INTRAMUSCULAR | Status: DC
  Administered 2020-11-23: 5 [IU] via SUBCUTANEOUS
  Administered 2020-11-23: 7 [IU] via SUBCUTANEOUS
  Administered 2020-11-24: 2 [IU] via SUBCUTANEOUS
  Administered 2020-11-24: 7 [IU] via SUBCUTANEOUS
  Administered 2020-11-24: 3 [IU] via SUBCUTANEOUS
  Administered 2020-11-25: 2 [IU] via SUBCUTANEOUS

## 2020-11-23 MED ORDER — PREDNISONE 50 MG PO TABS
50.0000 mg | ORAL_TABLET | Freq: Every day | ORAL | Status: DC
  Administered 2020-11-24 – 2020-11-25 (×2): 50 mg via ORAL
  Filled 2020-11-23 (×2): qty 1

## 2020-11-23 MED ORDER — GUAIFENESIN ER 600 MG PO TB12
600.0000 mg | ORAL_TABLET | Freq: Two times a day (BID) | ORAL | Status: DC
  Administered 2020-11-23 – 2020-11-25 (×4): 600 mg via ORAL
  Filled 2020-11-23 (×4): qty 1

## 2020-11-23 MED ORDER — SODIUM CHLORIDE 0.9 % IV SOLN: INTRAVENOUS | Status: DC

## 2020-11-23 MED ORDER — INSULIN ASPART 100 UNIT/ML IJ SOLN
3.0000 [IU] | Freq: Three times a day (TID) | INTRAMUSCULAR | Status: DC
  Administered 2020-11-24 – 2020-11-25 (×4): 3 [IU] via SUBCUTANEOUS

## 2020-11-23 MED ORDER — SODIUM CHLORIDE 0.9 % IV BOLUS
500.0000 mL | Freq: Once | INTRAVENOUS | Status: AC
  Administered 2020-11-23: 500 mL via INTRAVENOUS

## 2020-11-23 MED ORDER — METOPROLOL TARTRATE 5 MG/5ML IV SOLN
5.0000 mg | Freq: Once | INTRAVENOUS | Status: AC
  Administered 2020-11-23: 5 mg via INTRAVENOUS
  Filled 2020-11-23: qty 5

## 2020-11-23 MED ORDER — NICOTINE 21 MG/24HR TD PT24
21.0000 mg | MEDICATED_PATCH | Freq: Every day | TRANSDERMAL | Status: DC
  Administered 2020-11-23 – 2020-11-25 (×3): 21 mg via TRANSDERMAL
  Filled 2020-11-23 (×3): qty 1

## 2020-11-23 NOTE — Progress Notes (Signed)
   11/23/20 0328  Assess: MEWS Score  Temp 98.1 F (36.7 C)  BP (!) 171/83  Pulse Rate (!) 108  Resp 20  SpO2 95 %  O2 Device Room Air  Assess: MEWS Score  MEWS Temp 0  MEWS Systolic 0  MEWS Pulse 1  MEWS RR 0  MEWS LOC 0  MEWS Score 1  MEWS Score Color Green  Assess: if the MEWS score is Yellow or Red  Were vital signs taken at a resting state? Yes  Focused Assessment No change from prior assessment  Does the patient meet 2 or more of the SIRS criteria? Yes  Does the patient have a confirmed or suspected source of infection? Yes  Provider and Rapid Response Notified? Yes  MEWS guidelines implemented *See Row Information* No, previously yellow, continue vital signs every 4 hours  Take Vital Signs  Increase Vital Sign Frequency  Yellow: Q 2hr X 2 then Q 4hr X 2, if remains yellow, continue Q 4hrs  Escalate  MEWS: Escalate Yellow: discuss with charge nurse/RN and consider discussing with provider and RRT  Document  Patient Outcome Stabilized after interventions (recheck VS after 500 ml bolus)  Progress note created (see row info) Yes  Assess: SIRS CRITERIA  SIRS Temperature  0  SIRS Pulse 1  SIRS Respirations  0  SIRS WBC 0  SIRS Score Sum  1  Rechecked VS after 500 ml NS bolus, improved HR and BP. called phlebotomy to draw repeat Lactic, patient is still resting comfortably, will continue to monitor

## 2020-11-23 NOTE — Progress Notes (Signed)
Inpatient Diabetes Program Recommendations  AACE/ADA: New Consensus Statement on Inpatient Glycemic Control (2015)  Target Ranges:  Prepandial:   less than 140 mg/dL      Peak postprandial:   less than 180 mg/dL (1-2 hours)      Critically ill patients:  140 - 180 mg/dL   Lab Results  Component Value Date   GLUCAP 286 (H) 11/23/2020   HGBA1C 5.8 03/06/2018    Review of Glycemic Control Results for Sarah [name], Sarah [name] (MRN [id number]) as of 11/23/2020 13:50  Ref. Range 11/22/2020 22:37 11/23/2020 07:45 11/23/2020 12:01  Glucose-Capillary Latest Ref Range: 70 - 99 mg/dL 194 (H) 293 (H) 286 (H)   Diabetes history: DM2 Outpatient Diabetes medications: Amaryl 1 mg. bid + Metformin 1 gm bid Current orders for Inpatient glycemic control: Novolog 0-9 units tid   Inpatient Diabetes Program Recommendations:   While on steroids, -Increase Novolog correction to 0-15 units tid + hs 0-5 units Secure chat sent to Dr. Erlinda Hong.  Thank you, Nani Gasser. Loyce Klasen, RN, MSN, CDE  Diabetes Coordinator Inpatient Glycemic Control Team Team Pager [phone] (8am-5pm) 11/23/2020 2:12 PM

## 2020-11-23 NOTE — Progress Notes (Signed)
  Date and time results received: 11/23/20 0512  Test: Lactic acid Critical Value: 3.3  Name of Provider Notified: Darrell Jewel   Orders Received? Or Actions Taken?: Orders Received - See Orders for details

## 2020-11-23 NOTE — Progress Notes (Signed)
   11/23/20 0257  Assess: MEWS Score  Temp 98.4 F (36.9 C)  BP (!) 191/83  Pulse Rate (!) 113  Resp 18  Level of Consciousness Alert  SpO2 97 %  O2 Device Room Air  Assess: MEWS Score  MEWS Temp 0  MEWS Systolic 0  MEWS Pulse 2  MEWS RR 0  MEWS LOC 0  MEWS Score 2  MEWS Score Color Yellow  Assess: if the MEWS score is Yellow or Red  Were vital signs taken at a resting state? Yes  Focused Assessment Change from prior assessment (see assessment flowsheet)  Does the patient meet 2 or more of the SIRS criteria? Yes  Does the patient have a confirmed or suspected source of infection? Yes  Provider and Rapid Response Notified? Yes  MEWS guidelines implemented *See Row Information* Yes  Treat  MEWS Interventions Administered scheduled meds/treatments;Administered prn meds/treatments  Pain Scale 0-10  Pain Score 6  Pain Type Chronic pain  Pain Location Knee  Pain Orientation Right  Pain Descriptors / Indicators Aching  Pain Frequency Intermittent  Pain Onset On-going  Patients Stated Pain Goal 0  Pain Intervention(s) Medication (See eMAR) (gave flexril refused tylonel)  Take Vital Signs  Increase Vital Sign Frequency  Yellow: Q 2hr X 2 then Q 4hr X 2, if remains yellow, continue Q 4hrs  Escalate  MEWS: Escalate Yellow: discuss with charge nurse/RN and consider discussing with provider and RRT  Notify: Charge Nurse/RN  Name of Charge Nurse/RN Notified Tom  Date Charge Nurse/RN Notified 11/23/20  Time Charge Nurse/RN Notified 0300  Notify: Provider  Provider Name/Title Clearence Ped MD  Date Provider Notified 11/23/20  Time Provider Notified 0300  Notification Type Page (via Shea Evans)  Notification Reason Change in status;Critical result (paged provider at 0155 for lactic of 2.8 order received)  Assess: SIRS CRITERIA  SIRS Temperature  0  SIRS Pulse 1  SIRS Respirations  0  SIRS WBC 0  SIRS Score Sum  1  500 ml NS bolus is infusing will recheck VS when finish,  denies  chest pain or SOB patient is resting comfortably

## 2020-11-23 NOTE — Plan of Care (Signed)
  Problem: Nutrition: Goal: Adequate nutrition will be maintained Outcome: Progressing   

## 2020-11-23 NOTE — Progress Notes (Signed)
PROGRESS NOTE    Sarah [name]  YQI:[id number] DOB: [birth year] DOA: 11/22/2020 PCP: [name], FNP    Chief Complaint  Patient presents with  . Shortness of Breath    Brief Narrative:  Sarah [name] is a 65 y.o. female with medical history significant of type 2 diabetes mellitus, hypertension, DVT/PE, pancreatitis, GERD and COPD who presented with cough and chest pain.  Subjective: Report feeling woozy early this morning when got out of bed Reports continue to cough with whitish phlegm Continue to have pleuritic chest pain but seems has improved compare to yesterday Currently no wheezing  Assessment & Plan:   Principal Problem:   COPD with acute exacerbation (HCC) Active Problems:   LOW BACK PAIN   HTN (hypertension)   Diabetes type 2, controlled (HCC)   GERD (gastroesophageal reflux disease)   Lipoma   Smokes 2 packs of cigarettes per day  COPD exacerbation cxr on 6/7 showed "1. Interval patchy atelectasis or pneumonia at both lung bases.2. Stable pulmonary vascular congestion and chronic interstitial lung disease" No wheezing on exam today Change iv steroids to oral, continue oral Zithromax as clinical improving on this regimen Continue nebs, add on scheduled mucinex D/c ivf Ambulate check O2  Hypokalemia Replace K, check mag  Non-insulin-dependent type 2 diabetes, hyperglycemia Likely due to steroid Decrease steroid Add meal coverage insulin, continue sliding scale A1c ordered on admission does not appear to have done, reordered A1c  Hypertension Continue Norvasc/hydrochlorothiazide/lisinopril  Chronic diastolic CHF Last echo from 2016 showed LV EF  38%, grade 1 diastolic dysfunction Does not appear to have overt volume overload Lung exam has improved No edema DC IV hydration Continue home medication hydrochlorothiazide Follow-up with PCP  Chronic smoker Two pack a day for the last 2years since her husband of 47 yrs passed away in Dec 30, 2018 Smoking cessation education provided, nicotine patch provided  Depression , seen by pcp , on antidepressant, was referred to therapy Currently denies depression, denies SI/HI Continue Lexapro  Chronic pain; continue Flexeril and hydrocodone Per RN patient requested meloxicam, states she takes it at home  But lbuprofen and asa listed as allergy , will discuss with patient   History of gout, stable continue allopurinol     There is no height or weight on file to calculate BMI.[name]  Request height and weight measurement     Unresulted Labs (From admission, onward)          Start     Ordered   11/29/20 0500  Creatinine, serum  (enoxaparin (LOVENOX)    CrCl >/= 30 ml/min)  Weekly,   R     Comments: while on enoxaparin therapy    11/22/20 2044   11/24/20 0500  Hemoglobin A1c  Tomorrow morning,   R       Comments: To assess prior glycemic control    11/23/20 0857   11/24/20 0500  Hemoglobin A1c  Tomorrow morning,   R        11/23/20 2222   11/24/20 7564  Basic metabolic panel  Tomorrow morning,   R        11/23/20 2222   11/24/20 0500  Magnesium  Tomorrow morning,   R        11/23/20 2222   11/22/20 2045  Hemoglobin A1c  Once,   STAT       Comments: To assess prior glycemic control    11/22/20 2044  DVT prophylaxis: enoxaparin (LOVENOX) injection 40 mg Start: 11/22/20 2200 SCDs Start: 11/22/20 2045   Code Status: Full Family Communication: Patient Disposition:   Status is: Inpatient  Dispo: The patient is from: Home              Anticipated d/c is to: Home              Anticipated d/c date is: 24 to 48 hours, pending on respiratory status                Consultants:   None  Procedures:   None  Antimicrobials:   Anti-infectives (From admission, onward)   Start     Dose/Rate Route Frequency Ordered Stop   11/23/20 1000  azithromycin (ZITHROMAX) tablet 500 mg        500 mg Oral Daily 11/22/20 2044 11/27/20 0959   11/22/20 1630  cefTRIAXone  (ROCEPHIN) 1 g in sodium chloride 0.9 % 100 mL IVPB        1 g 200 mL/hr over 30 Minutes Intravenous  Once 11/22/20 1620 11/22/20 1648   11/22/20 1630  azithromycin (ZITHROMAX) 500 mg in sodium chloride 0.9 % 250 mL IVPB        500 mg 250 mL/hr over 60 Minutes Intravenous  Once 11/22/20 1620 11/22/20 1748          Objective: Vitals:   11/23/20 1115 11/23/20 1337 11/23/20 1500 11/23/20 2036  BP: (!) 156/68  (!) 164/87 (!) 164/58  Pulse: 91  98 82  Resp: 18  18 20   Temp: 98.8 F (37.1 C)  98 F (36.7 C) 98 F (36.7 C)  TempSrc:   Oral   SpO2: 97% 99% 98% 99%    Intake/Output Summary (Last 24 hours) at 11/23/2020 2232 Last data filed at 11/23/2020 1641 Gross per 24 hour  Intake 1342.53 ml  Output --  Net 1342.53 ml   There were no vitals filed for this visit.  Examination:  General exam: calm, NAD Respiratory system: Overall diminished, but no wheezing, no rales, no rhonchi. Respiratory effort normal. Cardiovascular system: S1 & S2 heard, RRR. No JVD, no murmur, No pedal edema. Gastrointestinal system: Abdomen is nondistended, soft and nontender.  Normal bowel sounds heard. Central nervous system: Alert and oriented. No focal neurological deficits. Extremities: Symmetric 5 x 5 power. Skin: No rashes, lesions or ulcers Psychiatry: Judgement and insight appear normal. Mood & affect appropriate.     Data Reviewed: I have personally reviewed following labs and imaging studies  CBC: Recent Labs  Lab 11/22/20 1430 11/23/20 0037  WBC 6.5 6.8  NEUTROABS 3.3  --   HGB 13.2 13.3  HCT 40.2 40.1  MCV 81.2 81.3  PLT 269 353    Basic Metabolic Panel: Recent Labs  Lab 11/22/20 1430 11/23/20 0037  NA 139 137  K 3.4* 3.3*  CL 103 100  CO2 31 27  GLUCOSE 161* 342*  BUN 8 12  CREATININE 0.82 0.79  CALCIUM 9.3 9.4    GFR: CrCl cannot be calculated (Unknown ideal weight.).  Liver Function Tests: Recent Labs  Lab 11/22/20 1430  AST 13*  ALT 13  ALKPHOS 118   BILITOT 0.6  PROT 7.4  ALBUMIN 3.8    CBG: Recent Labs  Lab 11/22/20 2237 11/23/20 0745 11/23/20 1201 11/23/20 1634 11/23/20 2205  GLUCAP 194* 293* 286* 314* 271*     Recent Results (from the past 240 hour(s))  Resp Panel by RT-PCR (Flu A&B, Covid) Nasopharyngeal Swab  Status: None   Collection Time: 11/22/20  2:21 PM   Specimen: Nasopharyngeal Swab; Nasopharyngeal(NP) swabs in vial transport medium  Result Value Ref Range Status   SARS Coronavirus 2 by RT PCR NEGATIVE NEGATIVE Final    Comment: (NOTE) SARS-CoV-2 target nucleic acids are NOT DETECTED.  The SARS-CoV-2 RNA is generally detectable in upper respiratory specimens during the acute phase of infection. The lowest concentration of SARS-CoV-2 viral copies this assay can detect is 138 copies/mL. A negative result does not preclude SARS-Cov-2 infection and should not be used as the sole basis for treatment or other patient management decisions. A negative result may occur with  improper specimen collection/handling, submission of specimen other than nasopharyngeal swab, presence of viral mutation(s) within the areas targeted by this assay, and inadequate number of viral copies(<138 copies/mL). A negative result must be combined with clinical observations, patient history, and epidemiological information. The expected result is Negative.  Fact Sheet for Patients:  EntrepreneurPulse.com.au  Fact Sheet for Healthcare Providers:  IncredibleEmployment.be  This test is no t yet approved or cleared by the [place] FDA and  has been authorized for detection and/or diagnosis of SARS-CoV-2 by FDA under an Emergency Use Authorization (EUA). This EUA will remain  in effect (meaning this test can be used) for the duration of the COVID-19 declaration under Section 564(b)(1) of the Act, 21 U.S.C.section 360bbb-3(b)(1), unless the authorization is terminated  or revoked sooner.        Influenza A by PCR NEGATIVE NEGATIVE Final   Influenza B by PCR NEGATIVE NEGATIVE Final    Comment: (NOTE) The Xpert Xpress SARS-CoV-2/FLU/RSV plus assay is intended as an aid in the diagnosis of influenza from Nasopharyngeal swab specimens and should not be used as a sole basis for treatment. Nasal washings and aspirates are unacceptable for Xpert Xpress SARS-CoV-2/FLU/RSV testing.  Fact Sheet for Patients: EntrepreneurPulse.com.au  Fact Sheet for Healthcare Providers: IncredibleEmployment.be  This test is not yet approved or cleared by the [place] FDA and has been authorized for detection and/or diagnosis of SARS-CoV-2 by FDA under an Emergency Use Authorization (EUA). This EUA will remain in effect (meaning this test can be used) for the duration of the COVID-19 declaration under Section 564(b)(1) of the Act, 21 U.S.C. section 360bbb-3(b)(1), unless the authorization is terminated or revoked.  Performed at [facility], [place] [address], [place], [place] [address]          Radiology Studies: DG Chest 2 View  Result Date: 11/22/2020 CLINICAL DATA:  Chest pain and cough.  Lethargy. EXAM: CHEST - 2 VIEW COMPARISON:  04/25/2020 FINDINGS: Borderline enlarged cardiac silhouette with a mild decrease in size. Interval mild patchy opacity at both lung bases, left greater than right. No significant change in diffuse prominence of the pulmonary vasculature and interstitial markings. No pleural fluid. Thoracic spine degenerative changes. IMPRESSION: 1. Interval patchy atelectasis or pneumonia at both lung bases. 2. Stable pulmonary vascular congestion and chronic interstitial lung disease. Electronically Signed   By: Claudie Revering M.D.   On: 11/22/2020 14:55        Scheduled Meds: . allopurinol  100 mg Oral Daily  . amLODipine  10 mg Oral Daily  . aspirin EC  81 mg Oral Daily  . azithromycin  500 mg Oral Daily  .  enoxaparin (LOVENOX) injection  40 mg Subcutaneous Q24H  . escitalopram  10 mg Oral Daily  . fluticasone  2 spray Each Nare Daily  . guaiFENesin  600 mg  Oral BID  . hydrochlorothiazide  25 mg Oral Daily  . insulin aspart  0-9 Units Subcutaneous TID WC  . [START ON 11/24/2020] insulin aspart  3 Units Subcutaneous TID WC  . ipratropium-albuterol  3 mL Nebulization TID  . latanoprost  1 drop Both Eyes QHS  . lisinopril  5 mg Oral q morning  . lubiprostone  24 mcg Oral Q breakfast  . mometasone-formoterol  2 puff Inhalation BID  . nicotine  21 mg Transdermal Daily  . pantoprazole  40 mg Oral Daily  . polyvinyl alcohol  1 drop Both Eyes TID  . pravastatin  20 mg Oral Daily  . [START ON 11/24/2020] predniSONE  50 mg Oral Q breakfast   Continuous Infusions:    LOS: 1 day   Time spent: 35 mins Greater than 50% of this time was spent in counseling, explanation of diagnosis, planning of further management, and coordination of care.   Voice Recognition Viviann Spare dictation system was used to create this note, attempts have been made to correct errors. Please contact the author with questions and/or clarifications.   Florencia Reasons, MD PhD FACP [facility]  Available via Epic secure chat 7am-7pm for nonurgent issues Please page for urgent issues To page the attending provider between 7A-7P or the covering provider during after hours 7P-7A, please log into the web site [email or web address] and access using universal [place] password for that web site. If you do not have the password, please call the hospital operator.    11/23/2020, 10:32 PM

## 2020-11-23 NOTE — Progress Notes (Signed)
Date and time results received: 11/23/20 0155    Test: Lactic Acid Critical Value: 2.8  Name of Provider Notified: Darrell Jewel MD  Orders Received? Or Actions Taken?: Orders Received - See Orders for details

## 2020-11-24 LAB — BASIC METABOLIC PANEL
Anion gap: 11 (ref 5–15)
BUN: 16 mg/dL (ref 8–23)
CO2: 27 mmol/L (ref 22–32)
Calcium: 9.2 mg/dL (ref 8.9–10.3)
Chloride: 100 mmol/L (ref 98–111)
Creatinine, Ser: 0.97 mg/dL (ref 0.44–1.00)
GFR, Estimated: >60 mL/min (ref >60)
Glucose, Bld: 177 mg/dL — ABNORMAL HIGH (ref 70–99)
Potassium: 3.1 mmol/L — ABNORMAL LOW (ref 3.5–5.1)
Sodium: 138 mmol/L (ref 135–145)

## 2020-11-24 LAB — GLUCOSE, CAPILLARY
Glucose-Capillary: 196 mg/dL — ABNORMAL HIGH (ref 70–99)
Glucose-Capillary: 237 mg/dL — ABNORMAL HIGH (ref 70–99)
Glucose-Capillary: 343 mg/dL — ABNORMAL HIGH (ref 70–99)
Glucose-Capillary: 372 mg/dL — ABNORMAL HIGH (ref 70–99)

## 2020-11-24 LAB — MAGNESIUM: Magnesium: 1.9 mg/dL (ref 1.7–2.4)

## 2020-11-24 LAB — HEMOGLOBIN A1C
Hgb A1c MFr Bld: 9 % — ABNORMAL HIGH (ref 4.8–5.6)
Mean Plasma Glucose: 212 mg/dL

## 2020-11-24 MED ORDER — SALINE SPRAY 0.65 % NA SOLN
1.0000 | Freq: Two times a day (BID) | NASAL | Status: DC
  Administered 2020-11-24 – 2020-11-25 (×2): 1 via NASAL
  Filled 2020-11-24: qty 44

## 2020-11-24 MED ORDER — POTASSIUM CHLORIDE CRYS ER 10 MEQ PO TBCR
40.0000 meq | EXTENDED_RELEASE_TABLET | Freq: Once | ORAL | Status: AC
  Administered 2020-11-24: 40 meq via ORAL
  Filled 2020-11-24: qty 4

## 2020-11-24 MED ORDER — IPRATROPIUM-ALBUTEROL 0.5-2.5 (3) MG/3ML IN SOLN
3.0000 mL | Freq: Two times a day (BID) | RESPIRATORY_TRACT | Status: DC
  Administered 2020-11-24 – 2020-11-25 (×2): 3 mL via RESPIRATORY_TRACT
  Filled 2020-11-24 (×2): qty 3

## 2020-11-24 NOTE — Progress Notes (Signed)
PROGRESS NOTE    Sarah [name]  RWE:[id number] DOB: [birth year] DOA: 11/22/2020 PCP: [name], FNP    Chief Complaint  Patient presents with   Shortness of Breath    Brief Narrative:  Sarah [name] is a 65 y.o. female with medical history significant of type 2 diabetes mellitus, hypertension, DVT/PE, pancreatitis, GERD and COPD who presented with cough and chest pain.  Subjective: Did not feel woozy upon standing today, still feel congested, with productive cough, had headache all day long, it still there  Less  pleuritic chest pain Currently no wheezing She is on room air at rest  Assessment & Plan:   Principal Problem:   COPD with acute exacerbation ([place]) Active Problems:   LOW BACK PAIN   HTN (hypertension)   Diabetes type 2, controlled (HCC)   GERD (gastroesophageal reflux disease)   Lipoma   Smokes 2 packs of cigarettes per day  COPD exacerbation cxr on 6/7 showed "1. Interval patchy atelectasis or pneumonia at both lung bases.2. Stable pulmonary vascular congestion and chronic interstitial lung disease" No wheezing on exam today Change iv steroids to oral, continue oral Zithromax as clinical improving on this regimen Continue nebs, add on scheduled mucinex D/c ivf Ambulate check O2  Hypokalemia Replace K, check mag  Headache Has tenderness left maxilla sinus region, continue nasal spray, abx, consider ct sinus if symptom persists  Non-insulin-dependent type 2 diabetes, hyperglycemia Likely due to steroid Decrease steroid Add meal coverage insulin, continue sliding scale A1c ordered on admission does not appear to have done, reordered A1c  Hypertension Continue Norvasc/hydrochlorothiazide/lisinopril  Chronic diastolic CHF Last echo from 2016 showed LV EF  61%, grade 1 diastolic dysfunction Does not appear to have overt volume overload Lung exam has improved No edema DC IV hydration Continue home medication hydrochlorothiazide Follow-up with  PCP  Chronic smoker Two pack a day for the last 2years since her husband of 75 yrs passed away in 12-28-18 Smoking cessation education provided, nicotine patch provided  Depression , seen by pcp , on antidepressant, was referred to therapy Currently denies depression, denies SI/HI Continue Lexapro  Chronic pain; continue Flexeril and hydrocodone Per RN patient requested meloxicam, states she takes it at home  But lbuprofen and asa listed as allergy , will discuss with patient   History of gout, stable continue allopurinol     There is no height or weight on file to calculate BMI.[name]  Request height and weight measurement     Unresulted Labs (From admission, onward)     Start     Ordered   11/29/20 0500  Creatinine, serum  (enoxaparin (LOVENOX)    CrCl >/= 30 ml/min)  Weekly,   R     Comments: while on enoxaparin therapy    11/22/20 2044   11/25/20 9509  Basic metabolic panel  Tomorrow morning,   R       Question:  Specimen collection method  Answer:  Lab=Lab collect   11/24/20 1712   11/25/20 0500  Magnesium  Tomorrow morning,   R       Question:  Specimen collection method  Answer:  Lab=Lab collect   11/24/20 1712   11/25/20 0500  Lactic acid, plasma  Tomorrow morning,   R       Question:  Specimen collection method  Answer:  Lab=Lab collect   11/24/20 1712   11/24/20 0500  Hemoglobin A1c  Tomorrow morning,   R  Comments: To assess prior glycemic control    11/23/20 0857              DVT prophylaxis: enoxaparin (LOVENOX) injection 40 mg Start: 11/22/20 2200 SCDs Start: 11/22/20 2045   Code Status: Full Family Communication: Patient Disposition:   Status is: Inpatient  Dispo: The patient is from: Home              Anticipated d/c is to: Home              Anticipated d/c date is: 24 to 48 hours, pending on respiratory status, headache                Consultants:  None  Procedures:  None  Antimicrobials:   Anti-infectives (From admission,  onward)    Start     Dose/Rate Route Frequency Ordered Stop   11/23/20 1000  azithromycin (ZITHROMAX) tablet 500 mg        500 mg Oral Daily 11/22/20 2044 11/27/20 0959   11/22/20 1630  cefTRIAXone (ROCEPHIN) 1 g in sodium chloride 0.9 % 100 mL IVPB        1 g 200 mL/hr over 30 Minutes Intravenous  Once 11/22/20 1620 11/22/20 1648   11/22/20 1630  azithromycin (ZITHROMAX) 500 mg in sodium chloride 0.9 % 250 mL IVPB        500 mg 250 mL/hr over 60 Minutes Intravenous  Once 11/22/20 1620 11/22/20 1748           Objective: Vitals:   11/24/20 0519 11/24/20 0821 11/24/20 1418 11/24/20 1421  BP: (!) 154/68  (!) 146/67 (!) 145/68  Pulse: (!) 59  97 83  Resp: 18  20 20   Temp: (!) 97.2 F (36.2 C)  98 F (36.7 C)   TempSrc:   Oral   SpO2: 99% 98% 97% 99%   No intake or output data in the 24 hours ending 11/24/20 1712  There were no vitals filed for this visit.  Examination:  General exam: calm, NAD Respiratory system: Overall diminished, but no wheezing, no rales, no rhonchi. Respiratory effort normal. Cardiovascular system: S1 & S2 heard, RRR. No JVD, no murmur, No pedal edema. Gastrointestinal system: Abdomen is nondistended, soft and nontender.  Normal bowel sounds heard. Central nervous system: Alert and oriented. No focal neurological deficits. Extremities: Symmetric 5 x 5 power. Skin: No rashes, lesions or ulcers Psychiatry: Judgement and insight appear normal. Mood & affect appropriate.     Data Reviewed: I have personally reviewed following labs and imaging studies  CBC: Recent Labs  Lab 11/22/20 1430 11/23/20 0037  WBC 6.5 6.8  NEUTROABS 3.3  --   HGB 13.2 13.3  HCT 40.2 40.1  MCV 81.2 81.3  PLT 269 604    Basic Metabolic Panel: Recent Labs  Lab 11/22/20 1430 11/23/20 0037 11/24/20 0544  NA 139 137 138  K 3.4* 3.3* 3.1*  CL 103 100 100  CO2 31 27 27   GLUCOSE 161* 342* 177*  BUN 8 12 16   CREATININE 0.82 0.79 0.97  CALCIUM 9.3 9.4 9.2  MG  --    --  1.9    GFR: CrCl cannot be calculated (Unknown ideal weight.).  Liver Function Tests: Recent Labs  Lab 11/22/20 1430  AST 13*  ALT 13  ALKPHOS 118  BILITOT 0.6  PROT 7.4  ALBUMIN 3.8    CBG: Recent Labs  Lab 11/23/20 1634 11/23/20 2205 11/24/20 0741 11/24/20 1136 11/24/20 1619  GLUCAP 314* 271* 196* 237*  343*     Recent Results (from the past 240 hour(s))  Resp Panel by RT-PCR (Flu A&B, Covid) Nasopharyngeal Swab     Status: None   Collection Time: 11/22/20  2:21 PM   Specimen: Nasopharyngeal Swab; Nasopharyngeal(NP) swabs in vial transport medium  Result Value Ref Range Status   SARS Coronavirus 2 by RT PCR NEGATIVE NEGATIVE Final    Comment: (NOTE) SARS-CoV-2 target nucleic acids are NOT DETECTED.  The SARS-CoV-2 RNA is generally detectable in upper respiratory specimens during the acute phase of infection. The lowest concentration of SARS-CoV-2 viral copies this assay can detect is 138 copies/mL. A negative result does not preclude SARS-Cov-2 infection and should not be used as the sole basis for treatment or other patient management decisions. A negative result may occur with  improper specimen collection/handling, submission of specimen other than nasopharyngeal swab, presence of viral mutation(s) within the areas targeted by this assay, and inadequate number of viral copies(<138 copies/mL). A negative result must be combined with clinical observations, patient history, and epidemiological information. The expected result is Negative.  Fact Sheet for Patients:  EntrepreneurPulse.com.au  Fact Sheet for Healthcare Providers:  IncredibleEmployment.be  This test is no t yet approved or cleared by the [place] FDA and  has been authorized for detection and/or diagnosis of SARS-CoV-2 by FDA under an Emergency Use Authorization (EUA). This EUA will remain  in effect (meaning this test can be used) for the  duration of the COVID-19 declaration under Section 564(b)(1) of the Act, 21 U.S.C.section 360bbb-3(b)(1), unless the authorization is terminated  or revoked sooner.       Influenza A by PCR NEGATIVE NEGATIVE Final   Influenza B by PCR NEGATIVE NEGATIVE Final    Comment: (NOTE) The Xpert Xpress SARS-CoV-2/FLU/RSV plus assay is intended as an aid in the diagnosis of influenza from Nasopharyngeal swab specimens and should not be used as a sole basis for treatment. Nasal washings and aspirates are unacceptable for Xpert Xpress SARS-CoV-2/FLU/RSV testing.  Fact Sheet for Patients: EntrepreneurPulse.com.au  Fact Sheet for Healthcare Providers: IncredibleEmployment.be  This test is not yet approved or cleared by the [place] FDA and has been authorized for detection and/or diagnosis of SARS-CoV-2 by FDA under an Emergency Use Authorization (EUA). This EUA will remain in effect (meaning this test can be used) for the duration of the COVID-19 declaration under Section 564(b)(1) of the Act, 21 U.S.C. section 360bbb-3(b)(1), unless the authorization is terminated or revoked.  Performed at [facility], [place] [address], [place], [place] [address]          Radiology Studies: No results found.       Scheduled Meds:  allopurinol  100 mg Oral Daily   amLODipine  10 mg Oral Daily   aspirin EC  81 mg Oral Daily   azithromycin  500 mg Oral Daily   enoxaparin (LOVENOX) injection  40 mg Subcutaneous Q24H   escitalopram  10 mg Oral Daily   fluticasone  2 spray Each Nare Daily   guaiFENesin  600 mg Oral BID   hydrochlorothiazide  25 mg Oral Daily   insulin aspart  0-9 Units Subcutaneous TID WC   insulin aspart  3 Units Subcutaneous TID WC   ipratropium-albuterol  3 mL Nebulization BID   latanoprost  1 drop Both Eyes QHS   lisinopril  5 mg Oral q morning   lubiprostone  24 mcg Oral Q breakfast   mometasone-formoterol  2  puff Inhalation BID  nicotine  21 mg Transdermal Daily   pantoprazole  40 mg Oral Daily   polyvinyl alcohol  1 drop Both Eyes TID   pravastatin  20 mg Oral Daily   predniSONE  50 mg Oral Q breakfast   sodium chloride  1 spray Each Nare BID   Continuous Infusions:    LOS: 2 days   Time spent: 25 mins Greater than 50% of this time was spent in counseling, explanation of diagnosis, planning of further management, and coordination of care.   Voice Recognition Viviann Spare dictation system was used to create this note, attempts have been made to correct errors. Please contact the author with questions and/or clarifications.   Florencia Reasons, MD PhD FACP [facility]  Available via Epic secure chat 7am-7pm for nonurgent issues Please page for urgent issues To page the attending provider between 7A-7P or the covering provider during after hours 7P-7A, please log into the web site [email or web address] and access using universal [place] password for that web site. If you do not have the password, please call the hospital operator.    11/24/2020, 5:12 PM

## 2020-11-25 LAB — BASIC METABOLIC PANEL
Anion gap: 8 (ref 5–15)
BUN: 17 mg/dL (ref 8–23)
CO2: 31 mmol/L (ref 22–32)
Calcium: 10 mg/dL (ref 8.9–10.3)
Chloride: 97 mmol/L — ABNORMAL LOW (ref 98–111)
Creatinine, Ser: 0.68 mg/dL (ref 0.44–1.00)
GFR, Estimated: >60 mL/min (ref >60)
Glucose, Bld: 191 mg/dL — ABNORMAL HIGH (ref 70–99)
Potassium: 3.5 mmol/L (ref 3.5–5.1)
Sodium: 136 mmol/L (ref 135–145)

## 2020-11-25 LAB — HEMOGLOBIN A1C
Hgb A1c MFr Bld: 8.9 % — ABNORMAL HIGH (ref 4.8–5.6)
Mean Plasma Glucose: 209 mg/dL

## 2020-11-25 LAB — GLUCOSE, CAPILLARY
Glucose-Capillary: 172 mg/dL — ABNORMAL HIGH (ref 70–99)
Glucose-Capillary: 257 mg/dL — ABNORMAL HIGH (ref 70–99)

## 2020-11-25 LAB — MAGNESIUM: Magnesium: 2.1 mg/dL (ref 1.7–2.4)

## 2020-11-25 LAB — LACTIC ACID, PLASMA: Lactic Acid, Venous: 1.2 mmol/L (ref 0.5–1.9)

## 2020-11-25 MED ORDER — ESCITALOPRAM OXALATE 10 MG PO TABS: 10.0000 mg | ORAL_TABLET | Freq: Every day | ORAL | Status: AC

## 2020-11-25 MED ORDER — PREDNISONE 10 MG PO TABS: ORAL_TABLET | ORAL | 0 refills | Status: DC

## 2020-11-25 MED ORDER — SALINE SPRAY 0.65 % NA SOLN: 1.0000 | Freq: Two times a day (BID) | NASAL | 0 refills | Status: AC

## 2020-11-25 MED ORDER — ALBUTEROL SULFATE HFA 108 (90 BASE) MCG/ACT IN AERS: 2.0000 | INHALATION_SPRAY | RESPIRATORY_TRACT | Status: AC | PRN

## 2020-11-25 MED ORDER — GLIMEPIRIDE 1 MG PO TABS: 1.0000 mg | ORAL_TABLET | Freq: Two times a day (BID) | ORAL | Status: DC

## 2020-11-25 MED ORDER — LUMIGAN 0.01 % OP SOLN: 1.0000 [drp] | Freq: Every day | OPHTHALMIC | 0 refills | Status: AC

## 2020-11-25 MED ORDER — AZITHROMYCIN 500 MG PO TABS: ORAL_TABLET | ORAL | 0 refills | Status: DC

## 2020-11-25 MED ORDER — FLUTICASONE PROPIONATE 50 MCG/ACT NA SUSP: 2.0000 | Freq: Every day | NASAL | 6 refills | Status: AC

## 2020-11-25 MED ORDER — BUDESONIDE-FORMOTEROL FUMARATE 160-4.5 MCG/ACT IN AERO
2.0000 | INHALATION_SPRAY | Freq: Two times a day (BID) | RESPIRATORY_TRACT | 2 refills | Status: DC
Start: 2020-11-25 — End: 2021-10-02

## 2020-11-25 NOTE — Care Management Important Message (Signed)
Important Message  Patient Details IM Letter given to the Patient. Name: Sarah [name] MRN: [id number] Date of Birth: 1955-09-21   Medicare Important Message Given:  Yes     Kerin Salen 11/25/2020, 11:06 AM

## 2020-11-25 NOTE — Progress Notes (Signed)
Inpatient Diabetes Program Recommendations  AACE/ADA: New Consensus Statement on Inpatient Glycemic Control (2015)  Target Ranges:  Prepandial:   less than 140 mg/dL      Peak postprandial:   less than 180 mg/dL (1-2 hours)      Critically ill patients:  140 - 180 mg/dL   Lab Results  Component Value Date   GLUCAP 172 (H) 11/25/2020   HGBA1C 8.9 (H) 11/24/2020    Review of Glycemic Control   Diabetes history: DM2 Outpatient Diabetes medications: Amaryl 1 mg. bid + Metformin 1 gm bid Current orders for Inpatient glycemic control: Novolog 3 units tid meal coverage + Novolog 0-9 units tid   Inpatient Diabetes Program Recommendations:   -Increase Novolog meal coverage to 6 units if eats 50% Secure chat to Dr. Erlinda Hong.  Thank you, Nani Gasser. Sandrea Boer, RN, MSN, CDE  Diabetes Coordinator Inpatient Glycemic Control Team Team Pager [phone] (8am-5pm) 11/25/2020 11:02 AM

## 2020-11-25 NOTE — Discharge Summary (Signed)
Discharge Summary  Sarah [name]:[id number] DOB: 11/14/1955  PCP: [name], FNP  Admit date: 11/22/2020 Discharge date: 11/25/2020  Time spent: 66mns,    Recommendations for Outpatient Follow-up:  F/u with PCP within a week  for hospital discharge follow up, repeat cbc/bmp at follow up, pcp to arrange outpatient sleep study PCP to monitor blood glucose control    Discharge Diagnoses:  Active Hospital Problems   Diagnosis Date Noted   COPD with acute exacerbation ([facility] 11/22/2020   Smokes 2 packs of cigarettes per day 12/09/2017   Lipoma 01/10/2017   GERD (gastroesophageal reflux disease) 03/02/2015   Di[place] 2, controlled [place]    HTN (hypertension) 06/27/2011   LOW BACK PAIN 02/05/2007    Resolved Hospital Problems  No resolved problems to display.    Discharge Condition: stable  Diet recommendation: heart healthy/carb modified  History of present illness: (Per admitting MD Dr. ACathlean Sauer Chief Complaint:  Cough and chest pain.    HPI: Sarah REVELLEis a 65y.o. female with medical history significant of type 2 diabetes mellitus, hypertension, DVT/PE, pancreatitis, GERD and COPD who presented with cough and chest pain.   She reports 2 weeks of worsening cough, productive in nature, associated with nausea and vomiting, no improving or worsening factors.  Over the last 7 days she has been experiencing chest pain, mainly with coughing, moderate in intensity with acute worsening over the last 24 hours that prompted her to call her primary care provider and she was advised to come to the hospital for further evaluation.   No fever or chills, no sick contacts.    Patient continues to smoke cigarettes.     ED Course: In the emergency department she has been diagnosed with committee acquired pneumonia, she received antibiotic therapy and was referred for admission for evaluation.  Hospital Course:  Principal Problem:   COPD with acute exacerbation  (HCC) Active Problems:   LOW BACK PAIN   HTN (hypertension)   Diabetes type 2, controlled (HCC)   GERD (gastroesophageal reflux disease)   Lipoma   Smokes 2 packs of cigarettes per day   COPD exacerbation cxr on 6/7 showed "1. Interval patchy atelectasis or pneumonia at both lung bases.2. Stable pulmonary vascular congestion and chronic interstitial lung disease" No wheezing on exam today Has improved , no hypoxia , discharged home on prednisone taper and Zithromax to finish course    Hypokalemia Replaced K, mag 2.1   Headache Has tenderness left maxilla sinus region, continue nasal spray, abx,  Symptom improved , follow-up with PCP    Non-insulin-dependent type 2 diabetes, hyperglycemia Likely due to steroid Received meal coverage insulin and sliding scale A1c 8.9% Taper steroid, follow-up with PCP   Hypertension Continue Norvasc/hydrochlorothiazide/lisinopril   Chronic diastolic CHF Last echo from 2016 showed LV EF  678% grade 1 diastolic dysfunction Does not appear to have overt volume overload Lung exam has improved No edema DC IV hydration Continue home medication hydrochlorothiazide Follow-up with PCP   Chronic smoker Two pack a day for the last 2years since her husband of 461yrs passed away in 62020/07/18Smoking cessation education provided, nicotine patch provided while in the hospital   Depression , seen by pcp , on antidepressant, was referred to therapy Currently denies depression, denies SI/HI Continue Lexapro   Chronic pain; continue Flexeril and hydrocodone  History of gout, stable continue allopurinol       Procedures: None  Consultations: None  Discharge Exam: BP ([name]  161/81 (BP Location: Right Arm)   Pulse 76   Temp 97.9 F (36.6 C) (Oral)   Resp 19   SpO2 94%   General: NAD, pleasant Cardiovascular: RRR Respiratory: Clear to auscultation bilaterally, wheezing has resolved, normal respiratory effort  Discharge Instructions You  were cared for by a hospitalist during your hospital stay. If you have any questions about your discharge medications or the care you received while you were in the hospital after you are discharged, you can call the unit and asked to speak with the hospitalist on call if the hospitalist that took care of you is not available. Once you are discharged, your primary care physician will handle any further medical issues. Please note that NO REFILLS for any discharge medications will be authorized once you are discharged, as it is imperative that you return to your primary care physician (or establish a relationship with a primary care physician if you do not have one) for your aftercare needs so that they can reassess your need for medications and monitor your lab values.  Discharge Instructions     Diet - low sodium heart healthy   Complete by: As directed    Carb modified diet   Increase activity slowly   Complete by: As directed       Allergies as of 11/25/2020       Reactions   Aspirin Other (See Comments)   Causes her stomach pain.   Ibuprofen Other (See Comments)   Causes stomach pain   Penicillins Other (See Comments)   Localized whelps and redness after injection Has patient had a PCN reaction causing immediate rash, facial/tongue/throat swelling, SOB or lightheadedness with hypotension: No Has patient had a PCN reaction causing severe rash involving mucus membranes or skin necrosis: Yes Has patient had a PCN reaction that required hospitalization No Has patient had a PCN reaction occurring within the last 10 years: No If all of the above answers are "NO", then may proceed with Cephalosporin use.        Medication List     TAKE these medications    albuterol 108 (90 Base) MCG/ACT inhaler Commonly known as: ProAir HFA Inhale 2 puffs into the lungs every 4 (four) hours as needed for wheezing or shortness of breath.   allopurinol 300 MG tablet Commonly known as:  ZYLOPRIM Take 300 mg by mouth daily. What changed: Another medication with the same name was removed. Continue taking this medication, and follow the directions you see here.   amLODipine 10 MG tablet Commonly known as: NORVASC Take 1 tablet (10 mg total) by mouth daily.   aspirin EC 81 MG tablet Take 1 tablet (81 mg total) by mouth daily.   azithromycin 500 MG tablet Commonly known as: ZITHROMAX Take azitrhomycin  on 6/11 x1 Start taking on: November 26, 2020   blood glucose meter kit and supplies Kit Libra Freestyle meter   budesonide-formoterol 160-4.5 MCG/ACT inhaler Commonly known as: SYMBICORT Inhale 2 puffs into the lungs 2 (two) times daily.   cyclobenzaprine 5 MG tablet Commonly known as: FLEXERIL Take 5 mg by mouth 3 (three) times daily as needed for muscle spasms.   escitalopram 10 MG tablet Commonly known as: LEXAPRO Take 1 tablet (10 mg total) by mouth daily.   fluticasone 50 MCG/ACT nasal spray Commonly known as: FLONASE Place 2 sprays into both nostrils daily.   glimepiride 1 MG tablet Commonly known as: Amaryl Take 1 tablet (1 mg total) by mouth 2 (two) times daily.  glucose blood test strip Commonly known as: ONE TOUCH ULTRA TEST Use as directed to test blood sugar once daily. DX code E11.9   guaiFENesin 100 MG/5ML liquid Commonly known as: ROBITUSSIN Take 200 mg by mouth 3 (three) times daily as needed for cough.   hydrochlorothiazide 25 MG tablet Commonly known as: HYDRODIURIL Take 1 tablet (25 mg total) by mouth daily.   lisinopril 5 MG tablet Commonly known as: ZESTRIL Take 1 tablet (5 mg total) by mouth every morning.   lubiprostone 24 MCG capsule Commonly known as: AMITIZA Take 24 mcg by mouth daily as needed for constipation.   Lumigan 0.01 % Soln Generic drug: bimatoprost Place 1 drop into both eyes at bedtime.   metFORMIN 1000 MG tablet Commonly known as: GLUCOPHAGE Take 1,000 mg by mouth 2 (two) times daily with a meal. What  changed: Another medication with the same name was removed. Continue taking this medication, and follow the directions you see here.   ONE TOUCH ULTRA MINI w/Device Kit Use as directed to test blood sugar once daily. DX code E75.1   OneTouch Delica Lancets 70Y Misc Use as directed to test blood sugar once daily. DX code E11.9   pravastatin 20 MG tablet Commonly known as: PRAVACHOL Take 1 tablet (20 mg total) by mouth daily.   predniSONE 10 MG tablet Commonly known as: DELTASONE Take prednisone with breakfast , 4 tabs on day one, then 3tabs on day two,  then 2 tabs on day three, then 1 tab on day four, than stop.   sodium chloride 0.65 % Soln nasal spray Commonly known as: OCEAN Place 1 spray into both nostrils 2 (two) times daily.       Allergies  Allergen Reactions   Aspirin Other (See Comments)    Causes her stomach pain.   Ibuprofen Other (See Comments)    Causes stomach pain   Penicillins Other (See Comments)    Localized whelps and redness after injection Has patient had a PCN reaction causing immediate rash, facial/tongue/throat swelling, SOB or lightheadedness with hypotension: No Has patient had a PCN reaction causing severe rash involving mucus membranes or skin necrosis: Yes Has patient had a PCN reaction that required hospitalization No Has patient had a PCN reaction occurring within the last 10 years: No If all of the above answers are "NO", then may proceed with Cephalosporin use.     Follow-up Information     [name], FNP Follow up.   Specialty: Family Medicine Why: hospital discharge follow up, repeat basic lab works including cbc/bmp at follow up pcp to refer you to have outpatient sleep study to rule out sleep apnea Contact information: [place] [address] [phone]                  The results of significant diagnostics from this hospitalization (including imaging, microbiology, ancillary and laboratory) are  listed below for reference.    Significant Diagnostic Studies: DG Chest 2 View  Result Date: 11/22/2020 CLINICAL DATA:  Chest pain and cough.  Lethargy. EXAM: CHEST - 2 VIEW COMPARISON:  04/25/2020 FINDINGS: Borderline enlarged cardiac silhouette with a mild decrease in size. Interval mild patchy opacity at both lung bases, left greater than right. No significant change in diffuse prominence of the pulmonary vasculature and interstitial markings. No pleural fluid. Thoracic spine degenerative changes. IMPRESSION: 1. Interval patchy atelectasis or pneumonia at both lung bases. 2. Stable pulmonary vascular congestion and chronic interstitial lung disease. Electronically Signed  By: Claudie Revering M.D.   On: 11/22/2020 14:55    Microbiology: Recent Results (from the past 240 hour(s))  Resp Panel by RT-PCR (Flu A&B, Covid) Nasopharyngeal Swab     Status: None   Collection Time: 11/22/20  2:21 PM   Specimen: Nasopharyngeal Swab; Nasopharyngeal(NP) swabs in vial transport medium  Result Value Ref Range Status   SARS Coronavirus 2 by RT PCR NEGATIVE NEGATIVE Final    Comment: (NOTE) SARS-CoV-2 target nucleic acids are NOT DETECTED.  The SARS-CoV-2 RNA is generally detectable in upper respiratory specimens during the acute phase of infection. The lowest concentration of SARS-CoV-2 viral copies this assay can detect is 138 copies/mL. A negative result does not preclude SARS-Cov-2 infection and should not be used as the sole basis for treatment or other patient management decisions. A negative result may occur with  improper specimen collection/handling, submission of specimen other than nasopharyngeal swab, presence of viral mutation(s) within the areas targeted by this assay, and inadequate number of viral copies(<138 copies/mL). A negative result must be combined with clinical observations, patient history, and epidemiological information. The expected result is Negative.  Fact Sheet for  Patients:  EntrepreneurPulse.com.au  Fact Sheet for Healthcare Providers:  IncredibleEmployment.be  This test is no t yet approved or cleared by the [place] FDA and  has been authorized for detection and/or diagnosis of SARS-CoV-2 by FDA under an Emergency Use Authorization (EUA). This EUA will remain  in effect (meaning this test can be used) for the duration of the COVID-19 declaration under Section 564(b)(1) of the Act, 21 U.S.C.section 360bbb-3(b)(1), unless the authorization is terminated  or revoked sooner.       Influenza A by PCR NEGATIVE NEGATIVE Final   Influenza B by PCR NEGATIVE NEGATIVE Final    Comment: (NOTE) The Xpert Xpress SARS-CoV-2/FLU/RSV plus assay is intended as an aid in the diagnosis of influenza from Nasopharyngeal swab specimens and should not be used as a sole basis for treatment. Nasal washings and aspirates are unacceptable for Xpert Xpress SARS-CoV-2/FLU/RSV testing.  Fact Sheet for Patients: EntrepreneurPulse.com.au  Fact Sheet for Healthcare Providers: IncredibleEmployment.be  This test is not yet approved or cleared by the [place] FDA and has been authorized for detection and/or diagnosis of SARS-CoV-2 by FDA under an Emergency Use Authorization (EUA). This EUA will remain in effect (meaning this test can be used) for the duration of the COVID-19 declaration under Section 564(b)(1) of the Act, 21 U.S.C. section 360bbb-3(b)(1), unless the authorization is terminated or revoked.  Performed at [facility], [place] [address] [place], [place] [address]      Labs: Basic Metabolic Panel: Recent Labs  Lab 11/22/20 1430 11/23/20 0037 11/24/20 0544 11/25/20 0521  NA 139 137 138 136  K 3.4* 3.3* 3.1* 3.5  CL 103 100 100 97*  CO2 31 27 27 31   GLUCOSE 161* 342* 177* 191*  BUN 8 12 16 17   CREATININE 0.82 0.79 0.97 0.68  CALCIUM 9.3 9.4 9.2  10.0  MG  --   --  1.9 2.1   Liver Function Tests: Recent Labs  Lab 11/22/20 1430  AST 13*  ALT 13  ALKPHOS 118  BILITOT 0.6  PROT 7.4  ALBUMIN 3.8   No results for input(s): LIPASE, AMYLASE in the last 168 hours. No results for input(s): AMMONIA in the last 168 hours. CBC: Recent Labs  Lab 11/22/20 1430 11/23/20 0037  WBC 6.5 6.8  NEUTROABS 3.3  --   HGB 13.2  13.3  HCT 40.2 40.1  MCV 81.2 81.3  PLT 269 277   Cardiac Enzymes: No results for input(s): CKTOTAL, CKMB, CKMBINDEX, TROPONINI in the last 168 hours. BNP: BNP (last 3 results) No results for input(s): BNP in the last 8760 hours.  ProBNP (last 3 results) No results for input(s): PROBNP in the last 8760 hours.  CBG: Recent Labs  Lab 11/24/20 1136 11/24/20 1619 11/24/20 2057 11/25/20 0735 11/25/20 1146  GLUCAP 237* 343* 372* 172* 257*       Signed:  Florencia Reasons MD, PhD, FACP  [facility] 11/25/2020, 10:34 PM

## 2020-11-25 NOTE — Progress Notes (Signed)
Patient will be discharging later this day. Belongings were returned to patient. Education on medications will be provided.

## 2021-01-30 DIAGNOSIS — M79641 Pain in right hand: Secondary | ICD-10-CM | POA: Insufficient documentation

## 2021-04-10 ENCOUNTER — Emergency Department (HOSPITAL_COMMUNITY): Payer: Medicare Other

## 2021-04-10 ENCOUNTER — Encounter (HOSPITAL_COMMUNITY): Payer: Self-pay | Admitting: Emergency Medicine

## 2021-04-10 ENCOUNTER — Other Ambulatory Visit: Payer: Self-pay

## 2021-04-10 ENCOUNTER — Emergency Department (HOSPITAL_COMMUNITY)
Admission: EM | Admit: 2021-04-10 | Discharge: 2021-04-11 | Disposition: A | Discharge status: Home / Self Care | Payer: Medicare Other | Attending: Student | Admitting: Student

## 2021-04-10 ENCOUNTER — Ambulatory Visit (HOSPITAL_COMMUNITY)
Admission: EM | Admit: 2021-04-10 | Discharge: 2021-04-10 | Disposition: A | Discharge status: Home / Self Care | Payer: Medicare Other

## 2021-04-10 DIAGNOSIS — R1013 Epigastric pain: Secondary | ICD-10-CM

## 2021-04-10 DIAGNOSIS — K760 Fatty (change of) liver, not elsewhere classified: Secondary | ICD-10-CM | POA: Insufficient documentation

## 2021-04-10 DIAGNOSIS — E119 Type 2 diabetes mellitus without complications: Secondary | ICD-10-CM | POA: Insufficient documentation

## 2021-04-10 DIAGNOSIS — R1011 Right upper quadrant pain: Secondary | ICD-10-CM

## 2021-04-10 DIAGNOSIS — Z79899 Other long term (current) drug therapy: Secondary | ICD-10-CM | POA: Insufficient documentation

## 2021-04-10 DIAGNOSIS — F17210 Nicotine dependence, cigarettes, uncomplicated: Secondary | ICD-10-CM | POA: Insufficient documentation

## 2021-04-10 DIAGNOSIS — Z7951 Long term (current) use of inhaled steroids: Secondary | ICD-10-CM | POA: Diagnosis not present

## 2021-04-10 DIAGNOSIS — J449 Chronic obstructive pulmonary disease, unspecified: Secondary | ICD-10-CM | POA: Insufficient documentation

## 2021-04-10 DIAGNOSIS — Z7982 Long term (current) use of aspirin: Secondary | ICD-10-CM | POA: Diagnosis not present

## 2021-04-10 DIAGNOSIS — Z7984 Long term (current) use of oral hypoglycemic drugs: Secondary | ICD-10-CM | POA: Diagnosis not present

## 2021-04-10 DIAGNOSIS — N281 Cyst of kidney, acquired: Secondary | ICD-10-CM | POA: Diagnosis not present

## 2021-04-10 DIAGNOSIS — K5730 Diverticulosis of large intestine without perforation or abscess without bleeding: Secondary | ICD-10-CM | POA: Insufficient documentation

## 2021-04-10 DIAGNOSIS — I509 Heart failure, unspecified: Secondary | ICD-10-CM | POA: Insufficient documentation

## 2021-04-10 DIAGNOSIS — I110 Hypertensive heart disease with heart failure: Secondary | ICD-10-CM | POA: Diagnosis not present

## 2021-04-10 LAB — URINALYSIS, ROUTINE W REFLEX MICROSCOPIC
Bilirubin Urine: NEGATIVE
Glucose, UA: >=500 mg/dL — AB
Hgb urine dipstick: NEGATIVE
Ketones, ur: NEGATIVE mg/dL
Leukocytes,Ua: NEGATIVE
Nitrite: NEGATIVE
Protein, ur: NEGATIVE mg/dL
Specific Gravity, Urine: 1.012 (ref 1.005–1.030)
pH: 6 (ref 5.0–8.0)

## 2021-04-10 LAB — COMPREHENSIVE METABOLIC PANEL
ALT: 17 U/L (ref 0–44)
AST: 13 U/L — ABNORMAL LOW (ref 15–41)
Albumin: 3.8 g/dL (ref 3.5–5.0)
Alkaline Phosphatase: 115 U/L (ref 38–126)
Anion gap: 10 (ref 5–15)
BUN: 8 mg/dL (ref 8–23)
CO2: 26 mmol/L (ref 22–32)
Calcium: 9.4 mg/dL (ref 8.9–10.3)
Chloride: 97 mmol/L — ABNORMAL LOW (ref 98–111)
Creatinine, Ser: 0.7 mg/dL (ref 0.44–1.00)
GFR, Estimated: >60 mL/min (ref >60)
Glucose, Bld: 287 mg/dL — ABNORMAL HIGH (ref 70–99)
Potassium: 3.9 mmol/L (ref 3.5–5.1)
Sodium: 133 mmol/L — ABNORMAL LOW (ref 135–145)
Total Bilirubin: 0.6 mg/dL (ref 0.3–1.2)
Total Protein: 7.1 g/dL (ref 6.5–8.1)

## 2021-04-10 LAB — CBC WITH DIFFERENTIAL/PLATELET
Abs Immature Granulocytes: 0.04 10*3/uL (ref 0.00–0.07)
Basophils Absolute: 0.1 10*3/uL (ref 0.0–0.1)
Basophils Relative: 1 %
Eosinophils Absolute: 0.2 10*3/uL (ref 0.0–0.5)
Eosinophils Relative: 2 %
HCT: 45 % (ref 36.0–46.0)
Hemoglobin: 14.5 g/dL (ref 12.0–15.0)
Immature Granulocytes: 1 %
Lymphocytes Relative: 42 %
Lymphs Abs: 3.3 10*3/uL (ref 0.7–4.0)
MCH: 26.4 pg (ref 26.0–34.0)
MCHC: 32.2 g/dL (ref 30.0–36.0)
MCV: 81.8 fL (ref 80.0–100.0)
Monocytes Absolute: 0.5 10*3/uL (ref 0.1–1.0)
Monocytes Relative: 7 %
Neutro Abs: 3.8 10*3/uL (ref 1.7–7.7)
Neutrophils Relative %: 47 %
Platelets: 289 10*3/uL (ref 150–400)
RBC: 5.5 MIL/uL — ABNORMAL HIGH (ref 3.87–5.11)
RDW: 15.9 % — ABNORMAL HIGH (ref 11.5–15.5)
WBC: 7.9 10*3/uL (ref 4.0–10.5)
nRBC: 0 % (ref 0.0–0.2)

## 2021-04-10 LAB — TYPE AND SCREEN
ABO/RH(D): B POS
Antibody Screen: NEGATIVE

## 2021-04-10 LAB — LIPASE, BLOOD: Lipase: 66 U/L — ABNORMAL HIGH (ref 11–51)

## 2021-04-10 LAB — ABO/RH: ABO/RH(D): B POS

## 2021-04-10 MED ORDER — NICOTINE 7 MG/24HR TD PT24
7.0000 mg | MEDICATED_PATCH | Freq: Once | TRANSDERMAL | Status: DC
  Filled 2021-04-10: qty 1

## 2021-04-10 NOTE — ED Provider Notes (Signed)
[facility]    CSN: [id number] Arrival date & time: 04/10/21  1753      History   Chief Complaint No chief complaint on file.   HPI Sarah [name] is a 65 y.o. female. Pt reports 1 week of intermittent RUQ and epigastric pain that has been constant for the last 4 days. Increased appetite, eating regular diet with no nausea or vomiting. Reports frequent bowel movements that are formed; stool was black today.  Has not taken anything to relieve pain. Has never had similar pain before. Hx hysterectomy and umbilical hernia repair in the past; still has gallbladder. Denies fever or chills.   HPI  Past Medical History:  Diagnosis Date   Acute pancreatitis    01-13-2015 per ED note   Adrenal adenoma    bilateral   Arthritis    Bilateral lower extremity edema    Complication of anesthesia    post-op 07-20-2014 surgery post-op Hypoxia due to acute CHF/ mild Pulmonary edema   COPD (chronic obstructive pulmonary disease) ([place])    Gastritis    01-13-2015  per ED note   GERD (gastroesophageal reflux disease)    Headache    History of acute congestive heart failure    07-22-2014   post-op surgery 07-20-2014   History of acute pancreatitis    01-04-2013    History of alcohol abuse    state quit 2002/  drank daily >fifth of gin   History of DVT of lower extremity    2000  post op left knee surgery   History of pulmonary edema    mild -  07-22-2014  post-op  surgery 07-20-2014   Hypertension    Type 2 diabetes mellitus (HCC)    Ulnar nerve compression    right elbow    Patient Active Problem List   Diagnosis Date Noted   Pneumonia 11/22/2020   COPD with acute exacerbation ([place]) 11/22/2020   Sudden right hearing loss 03/20/2018   Dental infection 12/09/2017   Smokes 2 packs of cigarettes per day 12/09/2017   Adrenal adenoma 11/08/2017   Lipoma 01/10/2017   Gum disease 01/10/2017   DM neuropathy, type II diabetes mellitus ([place]) 12/12/2015   GERD (gastroesophageal  reflux disease) 03/02/2015   Abdominal wall hernia 01/05/2013   Diabetes type 2, controlled ([place])    HTN (hypertension) 06/27/2011   TOBACCO ABUSE 02/05/2007   CONSTIPATION NOS 02/05/2007   OSTEOARTHROSIS, GENERALIZED, MULTIPLE SITES 02/05/2007   LOW BACK PAIN 02/05/2007    Past Surgical History:  Procedure Laterality Date   DILATION AND CURETTAGE OF UTERUS     INSERTION OF MESH N/A 07/20/2014   Procedure: INSERTION OF MESH;  Surgeon: Fanny Skates, MD;  Location: [facility];  Service: General;  Laterality: N/A;   KNEE ARTHROSCOPY Left 2000   TOTAL ABDOMINAL HYSTERECTOMY W/ BILATERAL SALPINGOOPHORECTOMY  1995   TRANSTHORACIC ECHOCARDIOGRAM  07-22-2014   mild LVH,  60-65%,  grade I diastolic dysfunction/  mild LAE   VENTRAL HERNIA REPAIR N/A 07/20/2014   Procedure: LAPAROSCOPIC REPAIR INCARCARATED VENTRAL HERNIA AND UMBILICAL HERNIA;  Surgeon: Fanny Skates, MD;  Location: [facility];  Service: General;  Laterality: N/A;    OB History   No obstetric history on file.      Home Medications    Prior to Admission medications   Medication Sig Start Date End Date Taking? Authorizing Provider  albuterol (PROAIR HFA) 108 (90 Base) MCG/ACT inhaler Inhale 2 puffs into the lungs every 4 (four) hours as needed  for wheezing or shortness of breath. 11/25/20   Florencia Reasons, MD  allopurinol (ZYLOPRIM) 300 MG tablet Take 300 mg by mouth daily. 09/27/20   [provider]  amLODipine (NORVASC) 10 MG tablet Take 1 tablet (10 mg total) by mouth daily. 12/21/17   Ladell Pier, MD  aspirin EC 81 MG tablet Take 1 tablet (81 mg total) by mouth daily. 07/02/16   Maren Reamer, MD  azithromycin (ZITHROMAX) 500 MG tablet Take azitrhomycin  on 6/11 x1 11/26/20   Florencia Reasons, MD  blood glucose meter kit and supplies KIT Libra Freestyle meter 03/14/17   Ladell Pier, MD  Blood Glucose Monitoring Suppl (ONE TOUCH ULTRA MINI) w/Device KIT Use as directed to test blood sugar once daily. DX code E11.9 03/12/18    Ladell Pier, MD  budesonide-formoterol [facility]) 160-4.5 MCG/ACT inhaler Inhale 2 puffs into the lungs 2 (two) times daily. 11/25/20   Florencia Reasons, MD  cyclobenzaprine (FLEXERIL) 5 MG tablet Take 5 mg by mouth 3 (three) times daily as needed for muscle spasms. 04/27/19   [provider]  escitalopram (LEXAPRO) 10 MG tablet Take 1 tablet (10 mg total) by mouth daily. 11/25/20   Florencia Reasons, MD  fluticasone (FLONASE) 50 MCG/ACT nasal spray Place 2 sprays into both nostrils daily. 11/25/20   Florencia Reasons, MD  glimepiride (AMARYL) 1 MG tablet Take 1 tablet (1 mg total) by mouth 2 (two) times daily. 11/25/20   Florencia Reasons, MD  glucose blood (ONE TOUCH ULTRA TEST) test strip Use as directed to test blood sugar once daily. DX code E11.9 03/12/18   Ladell Pier, MD  guaiFENesin (ROBITUSSIN) 100 MG/5ML liquid Take 200 mg by mouth 3 (three) times daily as needed for cough.    [provider]  hydrochlorothiazide (HYDRODIURIL) 25 MG tablet Take 1 tablet (25 mg total) by mouth daily. 10/28/17   Ladell Pier, MD  lisinopril (PRINIVIL,ZESTRIL) 5 MG tablet Take 1 tablet (5 mg total) by mouth every morning. 11/19/17   Ladell Pier, MD  lubiprostone (AMITIZA) 24 MCG capsule Take 24 mcg by mouth daily as needed for constipation. 09/27/20   [provider]  LUMIGAN 0.01 % SOLN Place 1 drop into both eyes at bedtime. 11/25/20   Florencia Reasons, MD  metFORMIN (GLUCOPHAGE) 1000 MG tablet Take 1,000 mg by mouth 2 (two) times daily with a meal.    [provider]  [facility] DELICA LANCETS 09W MISC Use as directed to test blood sugar once daily. DX code E11.9 03/12/18   Ladell Pier, MD  pravastatin (PRAVACHOL) 20 MG tablet Take 1 tablet (20 mg total) by mouth daily. 08/12/17   Ladell Pier, MD  predniSONE (DELTASONE) 10 MG tablet Take prednisone with breakfast , 4 tabs on day one, then 3tabs on day two,  then 2 tabs on day three, then 1 tab on day four, than stop. 11/25/20   Florencia Reasons,  MD  sodium chloride (OCEAN) 0.65 % SOLN nasal spray Place 1 spray into both nostrils 2 (two) times daily. 11/25/20   Florencia Reasons, MD    Family History History reviewed. No pertinent family history.  Social History Social History   Tobacco Use   Smoking status: Every Day    Packs/day: 1.50    Years: 40.00    Pack years: 60.00    Types: Cigarettes   Smokeless tobacco: Never  Vaping Use   Vaping Use: Never used  Substance Use Topics   Alcohol  use: No    Comment: hx alcohol abuse- drank >fifth of gin daily/  states Quit 2002   Drug use: No     Allergies   Aspirin, Ibuprofen, and Penicillins   Review of Systems Review of Systems   Physical Exam Triage Vital Signs ED Triage Vitals  Enc Vitals Group     BP 04/10/21 1916 (!) 159/81     Pulse Rate 04/10/21 1916 88     Resp 04/10/21 1916 18     Temp 04/10/21 1916 98.2 F (36.8 C)     Temp Source 04/10/21 1916 Oral     SpO2 04/10/21 1916 96 %     Weight --      Height --      Head Circumference --      Peak Flow --      Pain Score 04/10/21 1914 9     Pain Loc --      Pain Edu? --      Excl. in [place]? --    No data found.  Updated Vital Signs BP (!) 159/81 (BP Location: Right Arm)   Pulse 88   Temp 98.2 F (36.8 C) (Oral)   Resp 18   SpO2 96%   Visual Acuity Right Eye Distance:   Left Eye Distance:   Bilateral Distance:    Right Eye Near:   Left Eye Near:    Bilateral Near:     Physical Exam Constitutional:      General: She is in acute distress.     Appearance: She is obese.  Cardiovascular:     Rate and Rhythm: Regular rhythm. Tachycardia present.  Pulmonary:     Effort: Pulmonary effort is normal.     Breath sounds: Normal breath sounds.  Abdominal:     General: Bowel sounds are increased. There is distension.     Tenderness: There is abdominal tenderness in the right upper quadrant and epigastric area. There is rebound. Positive signs include Murphy's sign.  Neurological:     Mental Status: She  is alert.     UC Treatments / Results  Labs (all labs ordered are listed, but only abnormal results are displayed) Labs Reviewed - No data to display  EKG   Radiology No results found.  Procedures Procedures (including critical care time)  Medications Ordered in UC Medications - No data to display  Initial Impression / Assessment and Plan / UC Course  I have reviewed the triage vital signs and the nursing notes.  Pertinent labs & imaging results that were available during my care of the patient were reviewed by me and considered in my medical decision making (see chart for details).    Pt with worsening abd pain in RUQ.  Given increased appetite and ability to eat regular diet without n/v, symptoms not consistent with cholecystitis, however pt has Murphy's sign and rebound tenderness. I advised her to seek care in the ER. She doesn't feel she can get there without EMS.   Final Clinical Impressions(s) / UC Diagnoses   Final diagnoses:  RUQ abdominal pain   Discharge Instructions   None    ED Prescriptions   None    PDMP not reviewed this encounter.   Carvel Getting, NP 04/10/21 1949

## 2021-04-10 NOTE — ED Provider Notes (Signed)
Emergency Medicine Provider Triage Evaluation Note  Sarah [name] , a 65 y.o. female  was evaluated in triage.  Pt complains of who presents with concern for right upper quadrant pain x1 week and 1 day of melena which episodes of very dark black tarry stool today no history of the same.  Review of Systems  Positive: Abdominal pain, melena Negative: Hematochezia, CP, SOB  Physical Exam  BP (!) 149/88   Pulse 92   Temp 98.2 F (36.8 C) (Oral)   Resp 16   SpO2 94%  Gen:   Awake, no distress   Resp:  Normal effort  MSK:   Moves extremities without difficulty  Other:  RUQ pain, epigastric pain  Medical Decision Making  Medically screening exam initiated at 9:20 PM.  Appropriate orders placed.  Sarah [name] was informed that the remainder of the evaluation will be completed by another provider, this initial triage assessment does not replace that evaluation, and the importance of remaining in the ED until their evaluation is complete.  This chart was dictated using voice recognition software, Dragon. Despite the best efforts of this provider to proofread and correct errors, errors may still occur which can change documentation meaning.    Aura Dials 04/10/21 2129    Tegeler, Gwenyth Allegra, MD 04/11/21 Laureen Abrahams

## 2021-04-10 NOTE — ED Triage Notes (Signed)
[facility] called for transport. 

## 2021-04-10 NOTE — ED Triage Notes (Signed)
Pt c/o abd pain x4 days, states aching 9/10. Pt advises she has been "eating like a dog, everything- as long as it was edible." Last week BM "rust colored," this AM "black as I am, or blacker." Hx GERD, hysterectomy, hernia

## 2021-04-10 NOTE — ED Triage Notes (Signed)
Pt presents with consistent abdominal pain xs 4 days. States having an increase in appetite and black stools.

## 2021-04-10 NOTE — ED Triage Notes (Addendum)
Patient is being discharged from the Urgent Care and sent to the Emergency Department via [facility] with daughter. Per A Kabbe, NP, patient is in need of higher level of care due to possible cholecystis. Patient is aware and verbalizes understanding of plan of care.  Vitals:   04/10/21 1916  BP: (!) 159/81  Pulse: 88  Resp: 18  Temp: 98.2 F (36.8 C)  SpO2: 96%     [facility] transport cancelled.

## 2021-04-11 ENCOUNTER — Emergency Department (HOSPITAL_COMMUNITY): Payer: Medicare Other

## 2021-04-11 LAB — CBG MONITORING, ED: Glucose-Capillary: 304 mg/dL — ABNORMAL HIGH (ref 70–99)

## 2021-04-11 MED ORDER — PANTOPRAZOLE SODIUM 20 MG PO TBEC: 20.0000 mg | DELAYED_RELEASE_TABLET | Freq: Every day | ORAL | 0 refills | Status: DC

## 2021-04-11 MED ORDER — MORPHINE SULFATE (PF) 4 MG/ML IV SOLN
4.0000 mg | Freq: Once | INTRAVENOUS | Status: AC
  Administered 2021-04-11: 4 mg via INTRAVENOUS
  Filled 2021-04-11: qty 1

## 2021-04-11 MED ORDER — ONDANSETRON HCL 4 MG/2ML IJ SOLN
4.0000 mg | Freq: Once | INTRAMUSCULAR | Status: AC
  Administered 2021-04-11: 4 mg via INTRAVENOUS
  Filled 2021-04-11: qty 2

## 2021-04-11 MED ORDER — DICYCLOMINE HCL 20 MG PO TABS: 20.0000 mg | ORAL_TABLET | Freq: Two times a day (BID) | ORAL | 0 refills | Status: DC

## 2021-04-11 MED ORDER — IOHEXOL 350 MG/ML SOLN
100.0000 mL | Freq: Once | INTRAVENOUS | Status: AC | PRN
  Administered 2021-04-11: 100 mL via INTRAVENOUS

## 2021-04-11 MED ORDER — FAMOTIDINE IN NACL 20-0.9 MG/50ML-% IV SOLN
20.0000 mg | Freq: Once | INTRAVENOUS | Status: AC
  Administered 2021-04-11: 20 mg via INTRAVENOUS
  Filled 2021-04-11: qty 50
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
## 2021-04-11 NOTE — Discharge Instructions (Signed)
Take the antacid medications as prescribed.  Try taking the Bentyl to help with stomach cramping.  Follow-up with your primary care doctor or consider seeing a GI doctor for further evaluation.  Continue your diabetes medication and have your blood sugar rechecked by your primary care doctor

## 2021-04-11 NOTE — ED Notes (Signed)
Patient transported to CT 

## 2021-04-11 NOTE — ED Notes (Signed)
Pt taken back to a hallway area to be evaluated by EDP, pt refused and states she will wait in the waiting room for the next available room.
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
## 2021-04-11 NOTE — ED Notes (Signed)
Pt verbalizes understanding of discharge instructions. Opportunity for questions and answers were provided. Pt discharged from the ED.   ?

## 2021-04-11 NOTE — ED Notes (Signed)
Pt refused to get undressed.

## 2021-04-13 NOTE — ED Provider Notes (Signed)
[facility] EMERGENCY DEPARTMENT Provider Note   CSN: [id number] Arrival date & time: 04/10/21  2000     History Chief Complaint  Patient presents with   Abdominal Pain    Sarah [name] is a 65 y.o. female.   Abdominal Pain Associated symptoms: fever   Associated symptoms: no dysuria     Pt presented to the ED with complaints of upper abd pain.  Mostly in the upper abdomen and right side.  No nausea or vomiting.  Appetite intact.  Has noticed dark stools.  Pain increasing in severity.  Went to UC and was instructed to go to the ED>  Past Medical History:  Diagnosis Date   Acute pancreatitis    01-13-2015 per ED note   Adrenal adenoma    bilateral   Arthritis    Bilateral lower extremity edema    Complication of anesthesia    post-op 07-20-2014 surgery post-op Hypoxia due to acute CHF/ mild Pulmonary edema   COPD (chronic obstructive pulmonary disease) ([place])    Gastritis    01-13-2015  per ED note   GERD (gastroesophageal reflux disease)    Headache    History of acute congestive heart failure    07-22-2014   post-op surgery 07-20-2014   History of acute pancreatitis    01-04-2013    History of alcohol abuse    state quit 2002/  drank daily >fifth of gin   History of DVT of lower extremity    2000  post op left knee surgery   History of pulmonary edema    mild -  07-22-2014  post-op  surgery 07-20-2014   Hypertension    Type 2 diabetes mellitus (HCC)    Ulnar nerve compression    right elbow    Patient Active Problem List   Diagnosis Date Noted   Pneumonia 11/22/2020   COPD with acute exacerbation ([place]) 11/22/2020   Sudden right hearing loss 03/20/2018   Dental infection 12/09/2017   Smokes 2 packs of cigarettes per day 12/09/2017   Adrenal adenoma 11/08/2017   Lipoma 01/10/2017   Gum disease 01/10/2017   DM neuropathy, type II diabetes mellitus ([place]) 12/12/2015   GERD (gastroesophageal reflux disease) 03/02/2015   Abdominal wall  hernia 01/05/2013   Diabetes type 2, controlled ([place])    HTN (hypertension) 06/27/2011   TOBACCO ABUSE 02/05/2007   CONSTIPATION NOS 02/05/2007   OSTEOARTHROSIS, GENERALIZED, MULTIPLE SITES 02/05/2007   LOW BACK PAIN 02/05/2007    Past Surgical History:  Procedure Laterality Date   DILATION AND CURETTAGE OF UTERUS     INSERTION OF MESH N/A 07/20/2014   Procedure: INSERTION OF MESH;  Surgeon: Fanny Skates, MD;  Location: [facility];  Service: General;  Laterality: N/A;   KNEE ARTHROSCOPY Left 2000   TOTAL ABDOMINAL HYSTERECTOMY W/ BILATERAL SALPINGOOPHORECTOMY  1995   TRANSTHORACIC ECHOCARDIOGRAM  07-22-2014   mild LVH,  60-65%,  grade I diastolic dysfunction/  mild LAE   VENTRAL HERNIA REPAIR N/A 07/20/2014   Procedure: LAPAROSCOPIC REPAIR INCARCARATED VENTRAL HERNIA AND UMBILICAL HERNIA;  Surgeon: Fanny Skates, MD;  Location: [facility];  Service: General;  Laterality: N/A;     OB History   No obstetric history on file.     No family history on file.  Social History   Tobacco Use   Smoking status: Every Day    Packs/day: 1.50    Years: 40.00    Pack years: 60.00    Types: Cigarettes   Smokeless tobacco:  Never  Vaping Use   Vaping Use: Never used  Substance Use Topics   Alcohol use: No    Comment: hx alcohol abuse- drank >fifth of gin daily/  states Quit 2002   Drug use: No    Home Medications Prior to Admission medications   Medication Sig Start Date End Date Taking? Authorizing Provider  dicyclomine (BENTYL) 20 MG tablet Take 1 tablet (20 mg total) by mouth 2 (two) times daily. 04/11/21  Yes Dorie Rank, MD  pantoprazole (PROTONIX) 20 MG tablet Take 1 tablet (20 mg total) by mouth daily. 04/11/21  Yes Dorie Rank, MD  albuterol [facility] HFA) 108 [phone] Base) MCG/ACT inhaler Inhale 2 puffs into the lungs every 4 (four) hours as needed for wheezing or shortness of breath. 11/25/20   Florencia Reasons, MD  allopurinol (ZYLOPRIM) 300 MG tablet Take 300 mg by mouth daily. 09/27/20   [provider]  amLODipine (NORVASC) 10 MG tablet Take 1 tablet (10 mg total) by mouth daily. 12/21/17   Ladell Pier, MD  aspirin EC 81 MG tablet Take 1 tablet (81 mg total) by mouth daily. 07/02/16   Maren Reamer, MD  azithromycin (ZITHROMAX) 500 MG tablet Take azitrhomycin  on 6/11 x1 11/26/20   Florencia Reasons, MD  blood glucose meter kit and supplies KIT Libra Freestyle meter 03/14/17   Ladell Pier, MD  Blood Glucose Monitoring Suppl (ONE TOUCH ULTRA MINI) w/Device KIT Use as directed to test blood sugar once daily. DX code E11.9 03/12/18   Ladell Pier, MD  budesonide-formoterol [facility]) 160-4.5 MCG/ACT inhaler Inhale 2 puffs into the lungs 2 (two) times daily. 11/25/20   Florencia Reasons, MD  cyclobenzaprine (FLEXERIL) 5 MG tablet Take 5 mg by mouth 3 (three) times daily as needed for muscle spasms. 04/27/19   [provider]  escitalopram (LEXAPRO) 10 MG tablet Take 1 tablet (10 mg total) by mouth daily. 11/25/20   Florencia Reasons, MD  fluticasone (FLONASE) 50 MCG/ACT nasal spray Place 2 sprays into both nostrils daily. 11/25/20   Florencia Reasons, MD  glimepiride (AMARYL) 1 MG tablet Take 1 tablet (1 mg total) by mouth 2 (two) times daily. 11/25/20   Florencia Reasons, MD  glucose blood (ONE TOUCH ULTRA TEST) test strip Use as directed to test blood sugar once daily. DX code E11.9 03/12/18   Ladell Pier, MD  guaiFENesin (ROBITUSSIN) 100 MG/5ML liquid Take 200 mg by mouth 3 (three) times daily as needed for cough.    [provider]  hydrochlorothiazide (HYDRODIURIL) 25 MG tablet Take 1 tablet (25 mg total) by mouth daily. 10/28/17   Ladell Pier, MD  lisinopril (PRINIVIL,ZESTRIL) 5 MG tablet Take 1 tablet (5 mg total) by mouth every morning. 11/19/17   Ladell Pier, MD  lubiprostone (AMITIZA) 24 MCG capsule Take 24 mcg by mouth daily as needed for constipation. 09/27/20   [provider]  LUMIGAN 0.01 % SOLN Place 1 drop into both eyes at bedtime. 11/25/20   Florencia Reasons, MD   metFORMIN (GLUCOPHAGE) 1000 MG tablet Take 1,000 mg by mouth 2 (two) times daily with a meal.    [provider]  [facility] DELICA LANCETS 38G MISC Use as directed to test blood sugar once daily. DX code E11.9 03/12/18   Ladell Pier, MD  pravastatin (PRAVACHOL) 20 MG tablet Take 1 tablet (20 mg total) by mouth daily. 08/12/17   Ladell Pier, MD  predniSONE (DELTASONE) 10 MG tablet Take prednisone with  breakfast , 4 tabs on day one, then 3tabs on day two,  then 2 tabs on day three, then 1 tab on day four, than stop. 11/25/20   Florencia Reasons, MD  sodium chloride (OCEAN) 0.65 % SOLN nasal spray Place 1 spray into both nostrils 2 (two) times daily. 11/25/20   Florencia Reasons, MD    Allergies    Aspirin, Ibuprofen, and Penicillins  Review of Systems   Review of Systems  Constitutional:  Positive for fever.  Gastrointestinal:  Positive for abdominal pain.  Genitourinary:  Negative for dysuria.  All other systems reviewed and are negative.  Physical Exam Updated Vital Signs BP 139/78   Pulse 84   Temp 98.2 F (36.8 C) (Oral)   Resp 17   SpO2 97%   Physical Exam Vitals and nursing note reviewed.  Constitutional:      General: She is not in acute distress.    Appearance: She is well-developed.  HENT:     Head: Normocephalic and atraumatic.     Right Ear: External ear normal.     Left Ear: External ear normal.  Eyes:     General: No scleral icterus.       Right eye: No discharge.        Left eye: No discharge.     Conjunctiva/sclera: Conjunctivae normal.  Neck:     Trachea: No tracheal deviation.  Cardiovascular:     Rate and Rhythm: Normal rate and regular rhythm.  Pulmonary:     Effort: Pulmonary effort is normal. No respiratory distress.     Breath sounds: Normal breath sounds. No stridor. No wheezing or rales.  Abdominal:     General: Bowel sounds are normal. There is no distension.     Palpations: Abdomen is soft.     Tenderness: There is abdominal tenderness in  the right upper quadrant and epigastric area. There is no guarding or rebound.  Musculoskeletal:        General: No tenderness or deformity.     Cervical back: Neck supple.  Skin:    General: Skin is warm and dry.     Findings: No rash.  Neurological:     General: No focal deficit present.     Mental Status: She is alert.     Cranial Nerves: No cranial nerve deficit (no facial droop, extraocular movements intact, no slurred speech).     Sensory: No sensory deficit.     Motor: No abnormal muscle tone or seizure activity.     Coordination: Coordination normal.  Psychiatric:        Mood and Affect: Mood normal.    ED Results / Procedures / Treatments   Labs (all labs ordered are listed, but only abnormal results are displayed) Labs Reviewed  COMPREHENSIVE METABOLIC PANEL - Abnormal; Notable for the following components:      Result Value   Sodium 133 (*)    Chloride 97 (*)    Glucose, Bld 287 (*)    AST 13 (*)    All other components within normal limits  LIPASE, BLOOD - Abnormal; Notable for the following components:   Lipase 66 (*)    All other components within normal limits  CBC WITH DIFFERENTIAL/PLATELET - Abnormal; Notable for the following components:   RBC 5.50 (*)    RDW 15.9 (*)    All other components within normal limits  URINALYSIS, ROUTINE W REFLEX MICROSCOPIC - Abnormal; Notable for the following components:   APPearance HAZY (*)  Glucose, UA >=500 (*)    Bacteria, UA RARE (*)    All other components within normal limits  CBG MONITORING, ED - Abnormal; Notable for the following components:   Glucose-Capillary 304 (*)    All other components within normal limits  TYPE AND SCREEN  ABO/RH    EKG None  Radiology [place]: no acute findings CT scan: No obstruction, acute process  Procedures Procedures   Medications Ordered in ED Medications  morphine 4 MG/ML injection 4 mg (4 mg Intravenous Given 04/11/21 1325)  ondansetron (ZOFRAN) injection 4 mg (4 mg  Intravenous Given 04/11/21 1326)  famotidine (PEPCID) IVPB 20 mg premix (0 mg Intravenous Stopped 04/11/21 1434)  iohexol (OMNIPAQUE) 350 MG/ML injection 100 mL (100 mLs Intravenous Contrast Given 04/11/21 1451)    ED Course  I have reviewed the triage vital signs and the nursing notes.  Pertinent labs & imaging results that were available during my care of the patient were reviewed by me and considered in my medical decision making (see chart for details).  Clinical Course as of 04/13/21 1758  Tue Apr 11, 2021  1220 Abdominal ultrasound shows fatty liver but no other acute findings [JK]  1220 Chest x-ray shows diffuse nodular interstitial densities [JK]  1220 Lipase slightly elevated at 66.  Metabolic panel shows hyperglycemia [JK]  1221 CBC is normal [JK]    Clinical Course User Index [JK] Dorie Rank, MD   MDM Rules/Calculators/A&P                           Pt with upper abdominal pain.  COncerning for possible cholecystitis, pancreatitis, obstruction.  Labs reassuring.  CT and [place] without acute process.  Sx improved with treatment.  Pt able to eat and drink without difficulty.  ?gastritis, PUD.  Will treat with antacids.  Follow up with PCP, consider GI follow up Final Clinical Impression(s) / ED Diagnoses Final diagnoses:  RUQ pain  Epigastric pain    Rx / DC Orders ED Discharge Orders          Ordered    dicyclomine (BENTYL) 20 MG tablet  2 times daily        04/11/21 1532    pantoprazole (PROTONIX) 20 MG tablet  Daily        04/11/21 1532             Dorie Rank, MD 04/13/21 [id number]

## 2021-04-18 ENCOUNTER — Encounter: Payer: Self-pay | Admitting: Gastroenterology

## 2021-05-15 ENCOUNTER — Ambulatory Visit: Payer: Medicare Other | Admitting: Gastroenterology

## 2021-05-30 ENCOUNTER — Encounter: Payer: Self-pay | Admitting: Gastroenterology

## 2021-05-30 ENCOUNTER — Ambulatory Visit (INDEPENDENT_AMBULATORY_CARE_PROVIDER_SITE_OTHER): Payer: Medicare Other | Admitting: Gastroenterology

## 2021-05-30 VITALS — BP 118/60 | HR 84 | Ht 66.5 in | Wt 212.2 lb

## 2021-05-30 DIAGNOSIS — K5900 Constipation, unspecified: Secondary | ICD-10-CM

## 2021-05-30 DIAGNOSIS — Z1211 Encounter for screening for malignant neoplasm of colon: Secondary | ICD-10-CM | POA: Diagnosis not present

## 2021-05-30 DIAGNOSIS — R159 Full incontinence of feces: Secondary | ICD-10-CM

## 2021-05-30 DIAGNOSIS — R1013 Epigastric pain: Secondary | ICD-10-CM | POA: Diagnosis not present

## 2021-05-30 DIAGNOSIS — Z1212 Encounter for screening for malignant neoplasm of rectum: Secondary | ICD-10-CM

## 2021-05-30 NOTE — Patient Instructions (Signed)
If you are age 68 or older, your body mass index should be between 23-30. Your Body mass index is 33.74 kg/m. If this is out of the aforementioned range listed, please consider follow up with your Primary Care Provider.  If you are age 80 or younger, your body mass index should be between 19-25. Your Body mass index is 33.74 kg/m. If this is out of the aformentioned range listed, please consider follow up with your Primary Care Provider.   We will contact you to schedule your procedure at the hospital when the schedule comes out.  Start Metamucil daily.  The [facility] providers would like to encourage you to use [facility] to communicate with providers for non-urgent requests or questions.  Due to long hold times on the telephone, sending your provider a message by [facility] may be a faster and more efficient way to get a response.  Please allow 48 business hours for a response.  Please remember that this is for non-urgent requests.   It was a pleasure to see you today!  Thank you for trusting me with your gastrointestinal care!    Scott E.Candis Schatz, MD

## 2021-05-30 NOTE — Progress Notes (Signed)
HPI : Sarah [name] is a pleasant 65 year old female with a history of COPD, chronic tobacco use and history of alcoholic pancreatitis who was referred to [place] by Dustin Folks, FNP for further evaluation of persistent abdominal pain.  Patient states that she started having abdominal pain in August.  Pain was located in the upper abdomen and was constant.  It bothered her both day and night.  Eating did not seem to make it worse, but she does admit that her pain only got better when she stopped eating.  Pain variably described as burning, dull and sharp.  No nausea or vomiting. She tried taking Tylenol which did not help for the pain.  She used to take Aleve frequently, but she stopped this many years ago.  She does not take any NSAIDs now. She reports no change in her bowel habits, but she is bothered by chronic fecal incontinence.  Patient will experience small incontinence with laughing, coughing, sneezing or certain movements.  She denies full volume fecal incontinence.  She has chronic constipation, and has been prescribed Amitiza, however she says that this makes her incontinence worse.  She now wears depends because the incontinence is so frequent. Patient lives with her children and grandchildren, and admits that her grandchildren are very difficult and cause her significant stress, and that the last few months have been very difficult from that standpoint.  Currently, she says that she really does not have any pain anymore except when she gets angry or upset. She went to the emergency room October 24 and had an ultrasound of the right upper quadrant which showed hepatic steatosis, but was otherwise normal.  A CT scan on October 25 was similarly unrevealing for any causes of acute abdominal pain.  Labs were unremarkable except for very mildly elevated lipase (66). She has never had an upper or lower endoscopy. The patient has limited functional status secondary to her lung disease.  She states that she  gets short of breath with very minimal activity.  She has a reported history of acute CHF following surgery in February 2016.  Review of these records indicates that the patient had issues with transient hypoxemia and chest x-ray suggestive of volume overload following her surgery, but her echocardiogram showed no evidence of heart failure and her hypoxemia was felt to be due to her lung disease.   Past Medical History:  Diagnosis Date   Acute pancreatitis    01-13-2015 per ED note   Adrenal adenoma    bilateral   Arthritis    Bilateral lower extremity edema    Complication of anesthesia    post-op 07-20-2014 surgery post-op Hypoxia due to acute CHF/ mild Pulmonary edema   COPD (chronic obstructive pulmonary disease) ([place])    Gastritis    01-13-2015  per ED note   GERD (gastroesophageal reflux disease)    Headache    History of acute congestive heart failure    07-22-2014   post-op surgery 07-20-2014   History of acute pancreatitis    01-04-2013    History of alcohol abuse    state quit 2002/  drank daily >fifth of gin   History of DVT of lower extremity    2000  post op left knee surgery   History of pulmonary edema    mild -  07-22-2014  post-op  surgery 07-20-2014   Hypertension    Type 2 diabetes mellitus (HCC)    Ulnar nerve compression    right elbow  Past Surgical History:  Procedure Laterality Date   DILATION AND CURETTAGE OF UTERUS     INSERTION OF MESH N/A 07/20/2014   Procedure: INSERTION OF MESH;  Surgeon: Fanny Skates, MD;  Location: [facility];  Service: General;  Laterality: N/A;   KNEE ARTHROSCOPY Left 2000   TOTAL ABDOMINAL HYSTERECTOMY W/ BILATERAL SALPINGOOPHORECTOMY  1995   TRANSTHORACIC ECHOCARDIOGRAM  07-22-2014   mild LVH,  60-65%,  grade I diastolic dysfunction/  mild LAE   VENTRAL HERNIA REPAIR N/A 07/20/2014   Procedure: LAPAROSCOPIC REPAIR INCARCARATED VENTRAL HERNIA AND UMBILICAL HERNIA;  Surgeon: Fanny Skates, MD;  Location: [facility];  Service:  General;  Laterality: N/A;   Family History  Problem Relation Age of Onset   Coronary artery disease Father    Obesity Sister    Cerebral aneurysm Maternal Grandmother        hemorrhage   Heart disease Daughter        died at birth   Colon cancer Maternal Uncle    Social History   Tobacco Use   Smoking status: Every Day    Packs/day: 1.50    Years: 40.00    Pack years: 60.00    Types: Cigarettes   Smokeless tobacco: Never  Vaping Use   Vaping Use: Never used  Substance Use Topics   Alcohol use: No    Comment: hx alcohol abuse- drank >fifth of gin daily/  states Quit 2002   Drug use: No   Current Outpatient Medications  Medication Sig Dispense Refill   albuterol (PROAIR HFA) 108 (90 Base) MCG/ACT inhaler Inhale 2 puffs into the lungs every 4 (four) hours as needed for wheezing or shortness of breath.     allopurinol (ZYLOPRIM) 300 MG tablet Take 300 mg by mouth daily.     amLODipine (NORVASC) 10 MG tablet Take 1 tablet (10 mg total) by mouth daily. 90 tablet 3   aspirin EC 81 MG tablet Take 1 tablet (81 mg total) by mouth daily. 90 tablet 3   blood glucose meter kit and supplies KIT Libra Freestyle meter 1 each 0   Blood Glucose Monitoring Suppl (ONE TOUCH ULTRA MINI) w/Device KIT Use as directed to test blood sugar once daily. DX code E11.9 1 each 0   brimonidine-timolol (COMBIGAN) 0.2-0.5 % ophthalmic solution Apply 1 drop to eye 2 (two) times daily.     budesonide-formoterol (SYMBICORT) 160-4.5 MCG/ACT inhaler Inhale 2 puffs into the lungs 2 (two) times daily. 1 each 2   cyclobenzaprine (FLEXERIL) 5 MG tablet Take 5 mg by mouth 3 (three) times daily as needed for muscle spasms.     dicyclomine (BENTYL) 20 MG tablet Take 1 tablet (20 mg total) by mouth 2 (two) times daily. 20 tablet 0   escitalopram (LEXAPRO) 10 MG tablet Take 1 tablet (10 mg total) by mouth daily.     fluticasone (FLONASE) 50 MCG/ACT nasal spray Place 2 sprays into both nostrils daily. 16 g 6   glimepiride  (AMARYL) 1 MG tablet Take 1 tablet (1 mg total) by mouth 2 (two) times daily.     glucose blood (ONE TOUCH ULTRA TEST) test strip Use as directed to test blood sugar once daily. DX code E11.9 100 each 12   hydrochlorothiazide (HYDRODIURIL) 25 MG tablet Take 1 tablet (25 mg total) by mouth daily. 90 tablet 0   HYDROcodone-acetaminophen (NORCO/VICODIN) 5-325 MG tablet Take 1 tablet by mouth every 8 (eight) hours as needed.     ipratropium (ATROVENT) 0.02 % nebulizer solution  ipratropium bromide 0.02 % solution for inhalation  USE 1 VIAL IN NEBULIZER 4 TIMES DAILY     lisinopril (PRINIVIL,ZESTRIL) 5 MG tablet Take 1 tablet (5 mg total) by mouth every morning. 90 tablet 1   lubiprostone (AMITIZA) 24 MCG capsule Take 24 mcg by mouth daily as needed for constipation.     LUMIGAN 0.01 % SOLN Place 1 drop into both eyes at bedtime. 5 mL 0   meloxicam (MOBIC) 15 MG tablet Take 15 mg by mouth daily.     metFORMIN (GLUCOPHAGE) 1000 MG tablet Take 1,000 mg by mouth 2 (two) times daily with a meal.     omeprazole (PRILOSEC) 20 MG capsule Take 20 mg by mouth daily.     ONETOUCH DELICA LANCETS 49F MISC Use as directed to test blood sugar once daily. DX code E11.9 100 each 12   pravastatin (PRAVACHOL) 20 MG tablet Take 1 tablet (20 mg total) by mouth daily. 90 tablet 3   sodium chloride (OCEAN) 0.65 % SOLN nasal spray Place 1 spray into both nostrils 2 (two) times daily. 1 mL 0   No current facility-administered medications for this visit.   Allergies  Allergen Reactions   Aspirin Other (See Comments)    Causes her stomach pain.   Ibuprofen Other (See Comments)    Causes stomach pain   Penicillins Other (See Comments)    Localized whelps and redness after injection Has patient had a PCN reaction causing immediate rash, facial/tongue/throat swelling, SOB or lightheadedness with hypotension: No Has patient had a PCN reaction causing severe rash involving mucus membranes or skin necrosis: Yes Has patient  had a PCN reaction that required hospitalization No Has patient had a PCN reaction occurring within the last 10 years: No If all of the above answers are "NO", then may proceed with Cephalosporin use.      Review of Systems: All systems reviewed and negative except where noted in HPI.    No results found.  Physical Exam: BP 118/60 (BP Location: Left Arm, Patient Position: Sitting, Cuff Size: Normal)    Pulse 84    Ht 5' 6.5" (1.689 m)    Wt 212 lb 4 oz (96.3 kg)    BMI 33.74 kg/m  Constitutional: Pleasant,well-developed, African-American female in no acute distress.  Uses cane for ambulation.  Able to transfer onto exam table with some difficulty, but independently HEENT: Normocephalic and atraumatic. Conjunctivae are normal. No scleral icterus. Neck supple.  Cardiovascular: Normal rate, regular rhythm.  Pulmonary/chest: Effort normal and breath sounds normal. No wheezing, rales or rhonchi. Abdominal: Soft, nondistended, nontender. Bowel sounds active throughout. There are no masses palpable. No hepatomegaly. Extremities: no edema Neurological: Alert and oriented to person place and time. Skin: Skin is warm and dry. No rashes noted. Psychiatric: Normal mood and affect. Behavior is normal.  CBC    Component Value Date/Time   WBC 7.9 04/10/2021 2202   RBC 5.50 (H) 04/10/2021 2202   HGB 14.5 04/10/2021 2202   HGB 12.9 09/20/2017 1153   HCT 45.0 04/10/2021 2202   HCT 39.1 09/20/2017 1153   PLT 289 04/10/2021 2202   PLT 277 09/20/2017 1153   MCV 81.8 04/10/2021 2202   MCV 80 09/20/2017 1153   MCH 26.4 04/10/2021 2202   MCHC 32.2 04/10/2021 2202   RDW 15.9 (H) 04/10/2021 2202   RDW 15.1 09/20/2017 1153   LYMPHSABS 3.3 04/10/2021 2202   MONOABS 0.5 04/10/2021 2202   EOSABS 0.2 04/10/2021 2202   BASOSABS 0.1  04/10/2021 2202    CMP     Component Value Date/Time   NA 133 (L) 04/10/2021 2202   NA 142 09/20/2017 1153   K 3.9 04/10/2021 2202   CL 97 (L) 04/10/2021 2202    CO2 26 04/10/2021 2202   GLUCOSE 287 (H) 04/10/2021 2202   BUN 8 04/10/2021 2202   BUN 13 09/20/2017 1153   CREATININE 0.70 04/10/2021 2202   CREATININE 0.83 07/02/2016 1022   CALCIUM 9.4 04/10/2021 2202   PROT 7.1 04/10/2021 2202   PROT 7.0 09/20/2017 1153   ALBUMIN 3.8 04/10/2021 2202   ALBUMIN 4.2 09/20/2017 1153   AST 13 (L) 04/10/2021 2202   ALT 17 04/10/2021 2202   ALKPHOS 115 04/10/2021 2202   BILITOT 0.6 04/10/2021 2202   BILITOT 0.6 09/20/2017 1153   GFRNONAA >60 04/10/2021 2202   GFRNONAA 76 07/02/2016 1022   GFRAA >60 11/05/2017 2259   GFRAA 87 07/02/2016 1022   CLINICAL DATA:  Right upper quadrant pain   EXAM: ULTRASOUND ABDOMEN LIMITED RIGHT UPPER QUADRANT   COMPARISON:  None.   FINDINGS: Gallbladder:   No gallstones or wall thickening visualized (1.2 mm). No sonographic Murphy sign noted by sonographer.   Common bile duct:   Diameter: 5.1 mm   Liver:   The liver is enlarged (19.6 cm) no focal lesion identified. Diffusely increased echogenicity of the liver parenchyma is seen. Portal vein is patent on color Doppler imaging with normal direction of blood flow towards the liver.   Other: None.   IMPRESSION: Enlarged, fatty liver.     Electronically Signed   By: Virgina Norfolk M.D.   On: 04/10/2021 21:55  CLINICAL DATA:  Right upper quadrant, epigastric pain   EXAM: CT ABDOMEN AND PELVIS WITH CONTRAST   TECHNIQUE: Multidetector CT imaging of the abdomen and pelvis was performed using the standard protocol following bolus administration of intravenous contrast.   CONTRAST:  139m OMNIPAQUE IOHEXOL 350 MG/ML SOLN   COMPARISON:  11/06/2017   FINDINGS: Lower chest: No acute abnormality   Hepatobiliary: Diffuse low-density throughout the liver compatible with fatty infiltration. No focal abnormality. Gallbladder unremarkable.   Pancreas: No focal abnormality or ductal dilatation.   Spleen: No focal abnormality.  Normal size.    Adrenals/Urinary Tract: Bilateral adrenal lesions are similar to prior study. These likely reflect adenomas given their long-term stability. Small cyst in the upper pole of the left kidney is stable since prior study. No stones or hydronephrosis. Urinary bladder unremarkable.   Stomach/Bowel: Normal appendix. Scattered left colonic diverticulosis. No active diverticulitis. Stomach and small bowel decompressed. No obstruction.   Vascular/Lymphatic: Aortoiliac atherosclerosis. No evidence of aneurysm or adenopathy.   Reproductive: Prior hysterectomy.  No adnexal masses.   Other: No free fluid or free air.   Musculoskeletal: No acute bony abnormality.   IMPRESSION: Hepatic steatosis.   Stable bilateral adrenal lesions most compatible with adenomas.   Left colonic diverticulosis.  No active diverticulitis.   Aortic atherosclerosis.   No acute findings.     Electronically Signed   By: KRolm BaptiseM.D.   On: 04/11/2021 15:14   ASSESSMENT AND PLAN: 65year old female with several months of what she described as severe epigastric pain, which is since subsided.  Ultrasound, CT and labs in the emergency room were unremarkable for any causes of abdominal pain.  Patient admits that her pain seems to be worsened with emotional states and stress.  I suspect her pain may be functional in etiology, but given her  age and risk factors, an upper endoscopy to exclude concerning lesions such as mass or peptic ulcer disease is reasonable.  The patient is also never had a colonoscopy.  Although she has significant COPD, she is not oxygen dependent and I think a screening colonoscopy is still reasonable.  However given her poor functional status, I would recommend her procedures to be done in the hospital setting. Regarding her fecal incontinence, we discussed how incontinence can develop with age, as a sphincters we can.  For first-line, recommended that she just start a stool bulking agent.  If  she is not showing any improvement, we can do further evaluation to include anorectal manometry or endoscopic ultrasound and referral to pelvic floor physical therapy.  I am hopeful that the Metamucil will also help with her constipation, and she will not need to use the Amitiza, which does not work well for her given her incontinence.  Epigastric pain -EGD at [facility]  [place] screening -Colonoscopy at [facility]  Fecal incontinence, stress - Daily Metamucil  Constipation - Daily Metamucil, use Amitiza as needed   The details, risks (including bleeding, perforation, infection, missed lesions, medication reactions and possible hospitalization or surgery if complications occur), benefits, and alternatives to EGD/colonoscopy with possible biopsy and possible polypectomy were discussed with the patient and she consents to proceed.   Lamyia Cdebaca E. Candis Schatz, MD [facility]  CC:  [name], FNP

## 2021-05-31 ENCOUNTER — Encounter: Payer: Self-pay | Admitting: Gastroenterology

## 2021-08-18 ENCOUNTER — Telehealth: Payer: Self-pay | Admitting: Gastroenterology

## 2021-08-18 NOTE — Telephone Encounter (Signed)
Patient called and stated that she was supposed to get a call from [place] to schedule a procedure but she can not remember what it was for. Seeking advice please advise.  ?

## 2021-08-18 NOTE — Telephone Encounter (Signed)
Is this pt on the hospital procedure list for Dr. Candis Schatz? ?

## 2021-08-23 NOTE — Telephone Encounter (Signed)
Spoke with pt and let her know that we have her on the hospital waiting list and we will contact her when we have his next available hospital date. She verbalized understanding. ?

## 2021-08-24 ENCOUNTER — Other Ambulatory Visit: Payer: Self-pay

## 2021-08-24 ENCOUNTER — Telehealth: Payer: Self-pay

## 2021-08-24 DIAGNOSIS — R1013 Epigastric pain: Secondary | ICD-10-CM

## 2021-08-24 DIAGNOSIS — Z1211 Encounter for screening for malignant neoplasm of colon: Secondary | ICD-10-CM

## 2021-08-24 NOTE — Telephone Encounter (Signed)
Pt scheduled for previsit 10/02/21 at 2:30pm. Endo colon scheduled at [facility] 10/19/21 at 7:30am. Pt to arrive there at 6am. Pt aware of appts. ?

## 2021-10-02 ENCOUNTER — Ambulatory Visit (AMBULATORY_SURGERY_CENTER): Payer: Medicare Other | Admitting: *Deleted

## 2021-10-02 VITALS — Ht 66.5 in | Wt 221.0 lb

## 2021-10-02 DIAGNOSIS — Z1211 Encounter for screening for malignant neoplasm of colon: Secondary | ICD-10-CM

## 2021-10-02 DIAGNOSIS — Z1212 Encounter for screening for malignant neoplasm of rectum: Secondary | ICD-10-CM

## 2021-10-02 DIAGNOSIS — K5900 Constipation, unspecified: Secondary | ICD-10-CM

## 2021-10-02 DIAGNOSIS — R1013 Epigastric pain: Secondary | ICD-10-CM

## 2021-10-02 MED ORDER — PLENVU 140 G PO SOLR
1.0000 | Freq: Once | ORAL | 0 refills | Status: AC
Start: 2021-10-02 — End: 2021-10-02

## 2021-10-02 NOTE — Progress Notes (Signed)
Patient's pre-visit was done today over the phone with the patient. Name,DOB and address verified. Patient denies any allergies to Eggs and Soy. Patient is "hard to wake up" with anesthesia. Patient is not taking any diet pills or blood thinners. No home Oxygen. Insurance confirmed with patient. ? ?Prep instructions sent to pt's MyChart (if available) and copy on 2nd floor desk with Plenvu sample-pt is aware. Patient will come by soon to pick this up. Patient understands to call us back with any questions or concerns. Patient is aware of our care-partner policy. Patient denies constipation at this time-having BM daily and not taking any laxatives per pt. ? ?EMMI education assigned to the patient for the procedure, sent to [facility].  ? ?The patient is COVID-19 vaccinated.   ?

## 2021-10-16 ENCOUNTER — Encounter (HOSPITAL_COMMUNITY): Payer: Self-pay | Admitting: Gastroenterology

## 2021-10-19 ENCOUNTER — Ambulatory Visit (HOSPITAL_BASED_OUTPATIENT_CLINIC_OR_DEPARTMENT_OTHER): Payer: Medicare Other | Admitting: Certified Registered"

## 2021-10-19 ENCOUNTER — Other Ambulatory Visit: Payer: Self-pay

## 2021-10-19 ENCOUNTER — Encounter (HOSPITAL_COMMUNITY): Payer: Self-pay | Admitting: Gastroenterology

## 2021-10-19 ENCOUNTER — Ambulatory Visit (HOSPITAL_COMMUNITY)
Admission: RE | Admit: 2021-10-19 | Discharge: 2021-10-19 | Disposition: A | Discharge status: Home / Self Care | Payer: Medicare Other | Attending: Gastroenterology | Admitting: Gastroenterology

## 2021-10-19 ENCOUNTER — Ambulatory Visit (HOSPITAL_COMMUNITY): Payer: Medicare Other | Admitting: Certified Registered"

## 2021-10-19 ENCOUNTER — Encounter (HOSPITAL_COMMUNITY)
Admission: RE | Disposition: A | Discharge status: Home / Self Care | Payer: Self-pay | Source: Home / Self Care | Attending: Gastroenterology

## 2021-10-19 DIAGNOSIS — K2980 Duodenitis without bleeding: Secondary | ICD-10-CM | POA: Insufficient documentation

## 2021-10-19 DIAGNOSIS — K635 Polyp of colon: Secondary | ICD-10-CM | POA: Insufficient documentation

## 2021-10-19 DIAGNOSIS — R159 Full incontinence of feces: Secondary | ICD-10-CM | POA: Insufficient documentation

## 2021-10-19 DIAGNOSIS — J449 Chronic obstructive pulmonary disease, unspecified: Secondary | ICD-10-CM | POA: Insufficient documentation

## 2021-10-19 DIAGNOSIS — Z1212 Encounter for screening for malignant neoplasm of rectum: Secondary | ICD-10-CM

## 2021-10-19 DIAGNOSIS — K3189 Other diseases of stomach and duodenum: Secondary | ICD-10-CM

## 2021-10-19 DIAGNOSIS — E119 Type 2 diabetes mellitus without complications: Secondary | ICD-10-CM | POA: Diagnosis not present

## 2021-10-19 DIAGNOSIS — K621 Rectal polyp: Secondary | ICD-10-CM

## 2021-10-19 DIAGNOSIS — Z1211 Encounter for screening for malignant neoplasm of colon: Secondary | ICD-10-CM | POA: Diagnosis not present

## 2021-10-19 DIAGNOSIS — Z7984 Long term (current) use of oral hypoglycemic drugs: Secondary | ICD-10-CM | POA: Insufficient documentation

## 2021-10-19 DIAGNOSIS — R1013 Epigastric pain: Secondary | ICD-10-CM | POA: Diagnosis not present

## 2021-10-19 DIAGNOSIS — I10 Essential (primary) hypertension: Secondary | ICD-10-CM | POA: Insufficient documentation

## 2021-10-19 DIAGNOSIS — F17210 Nicotine dependence, cigarettes, uncomplicated: Secondary | ICD-10-CM | POA: Diagnosis not present

## 2021-10-19 DIAGNOSIS — K6289 Other specified diseases of anus and rectum: Secondary | ICD-10-CM | POA: Insufficient documentation

## 2021-10-19 HISTORY — PX: HEMOSTASIS CLIP PLACEMENT: SHX6857

## 2021-10-19 HISTORY — PX: ESOPHAGOGASTRODUODENOSCOPY (EGD) WITH PROPOFOL: SHX5813

## 2021-10-19 HISTORY — PX: POLYPECTOMY: SHX5525

## 2021-10-19 HISTORY — PX: COLONOSCOPY WITH PROPOFOL: SHX5780

## 2021-10-19 HISTORY — PX: BIOPSY: SHX5522

## 2021-10-19 LAB — GLUCOSE, CAPILLARY: Glucose-Capillary: 150 mg/dL — ABNORMAL HIGH (ref 70–99)

## 2021-10-19 SURGERY — ESOPHAGOGASTRODUODENOSCOPY (EGD) WITH PROPOFOL
Anesthesia: Monitor Anesthesia Care

## 2021-10-19 MED ORDER — PROPOFOL 10 MG/ML IV BOLUS
INTRAVENOUS | Status: DC | PRN
  Administered 2021-10-19: 10 mg via INTRAVENOUS
  Administered 2021-10-19: 30 mg via INTRAVENOUS
  Administered 2021-10-19: 20 mg via INTRAVENOUS

## 2021-10-19 MED ORDER — PROPOFOL 500 MG/50ML IV EMUL
INTRAVENOUS | Status: DC | PRN
  Administered 2021-10-19: 125 ug/kg/min via INTRAVENOUS

## 2021-10-19 MED ORDER — SODIUM CHLORIDE 0.9 % IV SOLN: INTRAVENOUS | Status: DC

## 2021-10-19 MED ORDER — LACTATED RINGERS IV SOLN
INTRAVENOUS | Status: DC
Start: 2021-10-19 — End: 2021-10-19
  Administered 2021-10-19: 1000 mL via INTRAVENOUS

## 2021-10-19 SURGICAL SUPPLY — 25 items

## 2021-10-19 NOTE — Transfer of Care (Signed)
Immediate Anesthesia Transfer of Care Note ? ?Patient: Sarah [name] ? ?Procedure(s) Performed: ESOPHAGOGASTRODUODENOSCOPY (EGD) WITH PROPOFOL ?COLONOSCOPY WITH PROPOFOL ? ?Patient Location: PACU ? ?Anesthesia Type:MAC ? ?Level of Consciousness: awake, alert  and oriented ? ?Airway & Oxygen Therapy: Patient Spontanous Breathing and Patient connected to face mask oxygen ? ?Post-op Assessment: Report given to RN, Post -op Vital signs reviewed and stable and Patient moving all extremities X 4 ? ?Post vital signs: Reviewed and stable ? ?Last Vitals:  ?Vitals Value Taken Time  ?BP 140/43 10/19/21 0901  ?Temp 36.7 ?C 10/19/21 0901  ?Pulse 99 10/19/21 0902  ?Resp 17 10/19/21 0902  ?SpO2 97 % 10/19/21 0902  ?Vitals shown include unvalidated device data. ? ?Last Pain:  ?Vitals:  ? 10/19/21 0901  ?TempSrc: Temporal  ?PainSc: 0-No pain  ?   ? ?  ? ?Complications: No notable events documented. ?

## 2021-10-19 NOTE — Discharge Instructions (Signed)
YOU HAD AN ENDOSCOPIC PROCEDURE TODAY: Refer to the procedure report and other information in the discharge instructions given to you for any specific questions about what was found during the examination. If this information does not answer your questions, please call [place] office at [phone] to clarify.  ° °YOU SHOULD EXPECT: Some feelings of bloating in the abdomen. Passage of more gas than usual. Walking can help get rid of the air that was put into your GI tract during the procedure and reduce the bloating. If you had a lower endoscopy (such as a colonoscopy or flexible sigmoidoscopy) you may notice spotting of blood in your stool or on the toilet paper. Some abdominal soreness may be present for a day or two, also. ° °DIET: Your first meal following the procedure should be a light meal and then it is ok to progress to your normal diet. A half-sandwich or bowl of soup is an example of a good first meal. Heavy or fried foods are harder to digest and may make you feel nauseous or bloated. Drink plenty of fluids but you should avoid alcoholic beverages for 24 hours. If you had a esophageal dilation, please see attached instructions for diet.   ° °ACTIVITY: Your care partner should take you home directly after the procedure. You should plan to take it easy, moving slowly for the rest of the day. You can resume normal activity the day after the procedure however YOU SHOULD NOT DRIVE, use power tools, machinery or perform tasks that involve climbing or major physical exertion for 24 hours (because of the sedation medicines used during the test).  ° °SYMPTOMS TO REPORT IMMEDIATELY: °A gastroenterologist can be reached at any hour. Please call [phone]  for any of the following symptoms:  °Following lower endoscopy (colonoscopy, flexible sigmoidoscopy) °Excessive amounts of blood in the stool  °Significant tenderness, worsening of abdominal pains  °Swelling of the abdomen that is new, acute  °Fever of 100° or  higher  °Following upper endoscopy (EGD, EUS, ERCP, esophageal dilation) °Vomiting of blood or coffee ground material  °New, significant abdominal pain  °New, significant chest pain or pain under the shoulder blades  °Painful or persistently difficult swallowing  °New shortness of breath  °Black, tarry-looking or red, bloody stools ° °FOLLOW UP:  °If any biopsies were taken you will be contacted by phone or by letter within the next 1-3 weeks. Call [phone]  if you have not heard about the biopsies in 3 weeks.  °Please also call with any specific questions about appointments or follow up tests. ° °

## 2021-10-19 NOTE — H&P (Signed)
[facility] History and Physical ? ? ?Primary Care Physician:  Antony Blackbird, MD ? ? ?Reason for Procedure:   Abdominal pain, colon cancer screening ? ?Plan:    EGD, colonoscopy ? ? ? ? ?HPI: Sarah [name] is a 66 y.o. female undergoing initial average risk screening colonoscopy.  She was seen in clinic in December with episodes of severe epigastric pain with normal CT.  She has continued to have problems with episodic abdominal epigastric pain.  She has mostly normal bowel movements but does have issues with fecal stress incontinence. She has no family history of colon cancer. ? ?Past Medical History:  ?Diagnosis Date  ? Acute pancreatitis   ? 01-13-2015 per ED note  ? 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 Arthritis   ? Bilateral lower extremity edema   ? Complication of anesthesia   ? post-op 07-20-2014 surgery post-op Hypoxia due to acute CHF/ mild Pulmonary edema  ? COPD (chronic obstructive pulmonary disease) ([place])   ? Gastritis   ? 01-13-2015  per ED note  ? GERD (gastroesophageal reflux disease)   ? Headache   ? History of acute congestive heart failure   ? 07-22-2014   post-op surgery 07-20-2014  ? History of acute pancreatitis   ? 01-04-2013   ? History of alcohol abuse   ? state quit 2002/  drank daily >fifth of gin  ? History of DVT of lower extremity   ? 2000  post op left knee surgery  ? History of pulmonary edema   ? mild -  07-22-2014  post-op  surgery 07-20-2014  ? Hypertension   ? Type 2 diabetes mellitus ([place])   ? Ulnar nerve compression   ? right elbow  ? ? ?Past Surgical History:  ?Procedure Laterality Date  ? DILATION AND CURETTAGE OF UTERUS    ? INSERTION OF MESH N/A 07/20/2014  ? Procedure: INSERTION OF MESH;  Surgeon: Fanny Skates, MD;  Location: [facility];  Service: General;  Laterality: N/A;  ? KNEE ARTHROSCOPY Left 2000  ? TOTAL ABDOMINAL HYSTERECTOMY W/ BILATERAL SALPINGOOPHORECTOMY  1995  ? TRANSTHORACIC ECHOCARDIOGRAM  07-22-2014  ? mild LVH,  60-65%,  grade I diastolic  dysfunction/  mild LAE  ? VENTRAL HERNIA REPAIR N/A 07/20/2014  ? Procedure: LAPAROSCOPIC REPAIR INCARCARATED VENTRAL HERNIA AND UMBILICAL HERNIA;  Surgeon: Fanny Skates, MD;  Location: [facility];  Service: General;  Laterality: N/A;  ? ? ?Prior to Admission medications   ?Medication Sig Start Date End Date Taking? Authorizing Provider  ?acetaminophen (TYLENOL) 500 MG tablet Take 1,000 mg by mouth every 6 (six) hours as needed for moderate pain or mild pain.   Yes [provider]  ?acetaminophen (TYLENOL) 650 MG CR tablet Take 1,300 mg by mouth every 8 (eight) hours as needed for pain.   Yes [provider]  ?albuterol (ACCUNEB) 1.25 MG/3ML nebulizer solution Take 1 ampule by nebulization 3 (three) times daily as needed for wheezing or shortness of breath. 07/14/21  Yes [provider]  ?albuterol (PROAIR HFA) 108 (90 Base) MCG/ACT inhaler Inhale 2 puffs into the lungs every 4 (four) hours as needed for wheezing or shortness of breath. 11/25/20  Yes Florencia Reasons, MD  ?allopurinol (ZYLOPRIM) 300 MG tablet Take 300 mg by mouth daily. 09/27/20  Yes [provider]  ?amLODipine (NORVASC) 10 MG tablet Take 1 tablet (10 mg total) by mouth daily. 12/21/17  Yes Ladell Pier, MD  ?aspirin EC 81 MG tablet Take 1 tablet (81 mg  total) by mouth daily. 07/02/16  Yes Maren Reamer, MD  ?ATROVENT HFA 17 MCG/ACT inhaler Inhale 2 puffs into the lungs every 6 (six) hours as needed for wheezing. 07/14/21  Yes [provider]  ?brimonidine-timolol (COMBIGAN) 0.2-0.5 % ophthalmic solution Apply 1 drop to eye 2 (two) times daily. 04/25/21  Yes [provider]  ?cyclobenzaprine (FLEXERIL) 5 MG tablet Take 5 mg by mouth 3 (three) times daily as needed for muscle spasms. 04/27/19  Yes [provider]  ?escitalopram (LEXAPRO) 10 MG tablet Take 1 tablet (10 mg total) by mouth daily. 11/25/20  Yes Florencia Reasons, MD  ?fluticasone Asencion Islam) 50 MCG/ACT nasal spray Place 2 sprays into both nostrils  daily. 11/25/20  Yes Florencia Reasons, MD  ?glipiZIDE (GLUCOTROL) 10 MG tablet Take 10 mg by mouth 2 (two) times daily. 07/31/21  Yes [provider]  ?hydrochlorothiazide (HYDRODIURIL) 25 MG tablet Take 1 tablet (25 mg total) by mouth daily. 10/28/17  Yes Ladell Pier, MD  ?lisinopril (PRINIVIL,ZESTRIL) 5 MG tablet Take 1 tablet (5 mg total) by mouth every morning. 11/19/17  Yes Ladell Pier, MD  ?loteprednol (LOTEMAX) 0.2 % SUSP Place 1 drop into the right eye 2 (two) times daily.   Yes [provider]  ?LUMIGAN 0.01 % SOLN Place 1 drop into both eyes at bedtime. 11/25/20  Yes Florencia Reasons, MD  ?meloxicam (MOBIC) 15 MG tablet Take 15 mg by mouth daily. 01/30/21  Yes [provider]  ?metFORMIN (GLUCOPHAGE) 1000 MG tablet Take 1,000 mg by mouth 2 (two) times daily with a meal.   Yes [provider]  ?omeprazole (PRILOSEC) 20 MG capsule Take 20 mg by mouth daily. 05/12/21  Yes [provider]  ?pravastatin (PRAVACHOL) 20 MG tablet Take 1 tablet (20 mg total) by mouth daily. 08/12/17  Yes Ladell Pier, MD  ?blood glucose meter kit and supplies KIT Libra Freestyle meter 03/14/17   Ladell Pier, MD  ?Blood Glucose Monitoring Suppl (ONE TOUCH ULTRA MINI) w/Device KIT Use as directed to test blood sugar once daily. DX code E11.9 03/12/18   Ladell Pier, MD  ?glimepiride (AMARYL) 1 MG tablet Take 1 tablet (1 mg total) by mouth 2 (two) times daily. ?Patient not taking: Reported on 10/13/2021 11/25/20   Florencia Reasons, MD  ?glucose blood (ONE TOUCH ULTRA TEST) test strip Use as directed to test blood sugar once daily. DX code E11.9 03/12/18   Ladell Pier, MD  ?[facility] DELICA LANCETS 46N MISC Use as directed to test blood sugar once daily. DX code E11.9 03/12/18   Ladell Pier, MD  ?sodium chloride (OCEAN) 0.65 % SOLN nasal spray Place 1 spray into both nostrils 2 (two) times daily. ?Patient not taking: Reported on 10/13/2021 11/25/20   Florencia Reasons, MD  ? ? ?Current  Facility-Administered Medications  ?Medication Dose Route Frequency Provider Last Rate Last Admin  ? 0.9 %  sodium chloride infusion   Intravenous Continuous Daryel November, MD      ? lactated ringers infusion   Intravenous Continuous Daryel November, MD 10 mL/hr at 10/19/21 0659 1,000 mL at 10/19/21 0659  ? ? ?Allergies as of 08/24/2021 - Review Complete 05/31/2021  ?Allergen Reaction Noted  ? Aspirin Other (See Comments) 02/19/2013  ? Ibuprofen Other (See Comments) 02/05/2007  ? Penicillins Other (See Comments) 02/05/2007  ? ? ?Family History  ?Problem Relation Age of Onset  ? Coronary artery disease Father   ? Obesity Sister   ?  Cerebral aneurysm Maternal Grandmother   ?     hemorrhage  ? Heart disease Daughter   ?     died at birth  ? Colon cancer Maternal Uncle   ? ? ?Social History  ? ?Socioeconomic History  ? Marital status: Married  ?  Spouse name: Not on file  ? Number of children: 1  ? Years of education: Not on file  ? Highest education level: Not on file  ?Occupational History  ? Occupation: disabled  ?Tobacco Use  ? Smoking status: Every Day  ?  Packs/day: 1.50  ?  Years: 40.00  ?  Pack years: 60.00  ?  Types: Cigarettes  ? Smokeless tobacco: Never  ?Vaping Use  ? Vaping Use: Never used  ?Substance and Sexual Activity  ? Alcohol use: No  ?  Comment: hx alcohol abuse- drank >fifth of gin daily/  states Quit 2002  ? Drug use: No  ? Sexual activity: Not on file  ?Other Topics Concern  ? Not on file  ?Social History Narrative  ? Not on file  ? ?Social Determinants of Health  ? ?Financial Resource Strain: Not on file  ?Food Insecurity: Not on file  ?Transportation Needs: Not on file  ?Physical Activity: Not on file  ?Stress: Not on file  ?Social Connections: Not on file  ?Intimate Partner Violence: Not on file  ? ? ?Review of Systems: ? ?All other review of systems negative except as mentioned in the HPI. ? ?Physical Exam: ?Vital signs ?BP (!) 163/70   Pulse 93   Temp 98.3 ?F (36.8 ?C) (Oral)    Resp 13   Ht 5' 6"  (1.676 m)   Wt 100.2 kg   SpO2 95%   BMI 35.65 kg/m?  ? ?General:   Alert,  Well-developed, well-nourished, pleasant and cooperative in NAD ?Airway:  Mallampati 3 ?Lungs:  Clear throughout to auscultati

## 2021-10-19 NOTE — Anesthesia Preprocedure Evaluation (Signed)
Anesthesia Evaluation  ?Patient identified by MRN, date of birth, ID band ?Patient awake ? ? ? ?Reviewed: ?Allergy & Precautions, NPO status , Patient's Chart, lab work & pertinent test results ? ?Airway ?Mallampati: II ? ?TM Distance: >3 FB ?Neck ROM: Full ? ? ? Dental ?no notable dental hx. ? ?  ?Pulmonary ?COPD, Current Smoker,  ?  ?breath sounds clear to auscultation ?+ decreased breath sounds ? ? ? ? ? Cardiovascular ?hypertension, Normal cardiovascular exam ?Rhythm:Regular Rate:Normal ? ? ?  ?Neuro/Psych ?negative neurological ROS ? negative psych ROS  ? GI/Hepatic ?negative GI ROS, Neg liver ROS,   ?Endo/Other  ?diabetes ? Renal/GU ?negative Renal ROS  ?negative genitourinary ?  ?Musculoskeletal ?negative musculoskeletal ROS ?(+)  ? Abdominal ?  ?Peds ?negative pediatric ROS ?(+)  Hematology ?negative hematology ROS ?(+)   ?Anesthesia Other Findings ? ? Reproductive/Obstetrics ?negative OB ROS ? ?  ? ? ? ? ? ? ? ? ? ? ? ? ? ?  ?  ? ? ? ? ? ? ? ? ?Anesthesia Physical ?Anesthesia Plan ? ?ASA: 3 ? ?Anesthesia Plan: MAC  ? ?Post-op Pain Management: Minimal or no pain anticipated  ? ?Induction: Intravenous ? ?PONV Risk Score and Plan: 2 and Propofol infusion ? ?Airway Management Planned: Simple Face Mask ? ?Additional Equipment:  ? ?Intra-op Plan:  ? ?Post-operative Plan:  ? ?Informed Consent: I have reviewed the patients History and Physical, chart, labs and discussed the procedure including the risks, benefits and alternatives for the proposed anesthesia with the patient or authorized representative who has indicated his/her understanding and acceptance.  ? ? ? ?Dental advisory given ? ?Plan Discussed with: CRNA and Surgeon ? ?Anesthesia Plan Comments:   ? ? ? ? ? ? ?Anesthesia Quick Evaluation ? ?

## 2021-10-19 NOTE — Anesthesia Procedure Notes (Signed)
Procedure Name: [place] ?Date/Time: 10/19/2021 7:33 AM ?Performed by: Niel Hummer, CRNA ?Pre-anesthesia Checklist: Patient identified, Emergency Drugs available, Suction available and Patient being monitored ?Oxygen Delivery Method: Simple face mask ? ? ? ? ?

## 2021-10-19 NOTE — Op Note (Signed)
[facility] ?Patient Name: Sarah [name] ?Procedure Date: 10/19/2021 ?MRN: [id number] ?Attending MD: Gladstone Pih. Candis Schatz , MD ?Date of Birth: 12-06-1955 ?CSN: [id number] ?Age: 66 ?Admit Type: Inpatient ?Procedure:                Upper GI endoscopy ?Indications:              Epigastric abdominal pain ?Providers:                Gladstone Pih. Candis Schatz, MD, Mikey College, RN,  ?                          Despina Pole, Technician ?Referring MD:              ?Medicines:                Monitored Anesthesia Care ?Complications:            No immediate complications. ?Estimated Blood Loss:     Estimated blood loss was minimal. ?Procedure:                Pre-Anesthesia Assessment: ?                          - Prior to the procedure, a History and Physical  ?                          was performed, and patient medications and  ?                          allergies were reviewed. The patient's tolerance of  ?                          previous anesthesia was also reviewed. The risks  ?                          and benefits of the procedure and the sedation  ?                          options and risks were discussed with the patient.  ?                          All questions were answered, and informed consent  ?                          was obtained. Prior Anticoagulants: The patient has  ?                          taken no previous anticoagulant or antiplatelet  ?                          agents. ASA Grade Assessment: III - A patient with  ?                          severe systemic disease. After reviewing the risks  ?  and benefits, the patient was deemed in  ?                          satisfactory condition to undergo the procedure. ?                          After obtaining informed consent, the endoscope was  ?                          passed under direct vision. Throughout the  ?                          procedure, the patient's blood pressure, pulse, and  ?                           oxygen saturations were monitored continuously. The  ?                          [id number]-[id number] ([id number]) Olympus endoscope was introduced  ?                          through the mouth, and advanced to the third part  ?                          of duodenum. The upper GI endoscopy was  ?                          accomplished without difficulty. The patient  ?                          tolerated the procedure well. ?Scope In: ?Scope Out: ?Findings: ?     The examined portions of the nasopharynx, oropharynx and larynx were  ?     normal. The mucosa overlying the arytenoids had a blackish discoloration ?     The examined esophagus was normal. ?     The entire examined stomach was normal. Biopsies were taken with a cold  ?     forceps for Helicobacter pylori testing. Estimated blood loss was  ?     minimal. ?     Localized mucosal changes characterized by prominent folds were found in  ?     the duodenal bulb. Biopsies were taken with a cold forceps for  ?     histology. Estimated blood loss was minimal. ?     The exam of the duodenum was otherwise normal. ?Impression:               - The examined portions of the nasopharynx,  ?                          oropharynx and larynx were normal. ?                          - Normal esophagus. ?                          - Normal stomach. Biopsied. ?                          -  Mucosal changes in the duodenum. Biopsied. ?                          - No abnormalities to explain abdominal pain. ?Moderate Sedation: ?     Not Applicable - Patient had care per Anesthesia. ?Recommendation:           - Patient has a contact number available for  ?                          emergencies. The signs and symptoms of potential  ?                          delayed complications were discussed with the  ?                          patient. Return to normal activities tomorrow.  ?                          Written discharge instructions were provided to the  ?                          patient. ?                           - Resume previous diet. ?                          - Continue present medications. ?                          - Await pathology results. ?                          - Recommend ENT evaluation of discolored mucosa  ?                          around vocal cords ?Procedure Code(s):        --- Professional --- ?                          [phone], Esophagogastroduodenoscopy, flexible,  ?                          transoral; with biopsy, single or multiple ?Diagnosis Code(s):        --- Professional --- ?                          K31.89, Other diseases of stomach and duodenum ?                          R10.13, Epigastric pain ?CPT copyright 2019 American Medical Association. All rights reserved. ?The codes documented in this report are preliminary and upon coder review may  ?be revised to meet current compliance requirements. ?Temia Debroux E. Candis Schatz, MD ?10/19/2021 9:04:20 AM ?This report has been signed electronically. ?Number of Addenda: 0 ?

## 2021-10-19 NOTE — Op Note (Signed)
[facility] ?Patient Name: Sarah [name] ?Procedure Date: 10/19/2021 ?MRN: [id number] ?Attending MD: Gladstone Pih. Candis Schatz , MD ?Date of Birth: [birth year] ?CSN: [id number] ?Age: 66 ?Admit Type: Outpatient ?Procedure:                Colonoscopy ?Indications:              Screening for colorectal malignant neoplasm, This  ?                          is the patient's first colonoscopy ?Providers:                Gladstone Pih. Candis Schatz, MD, Mikey College, RN,  ?                          Despina Pole, Technician ?Referring MD:              ?Medicines:                Monitored Anesthesia Care ?Complications:            No immediate complications. ?Estimated Blood Loss:     Estimated blood loss was minimal. ?Procedure:                Pre-Anesthesia Assessment: ?                          - Prior to the procedure, a History and Physical  ?                          was performed, and patient medications and  ?                          allergies were reviewed. The patient's tolerance of  ?                          previous anesthesia was also reviewed. The risks  ?                          and benefits of the procedure and the sedation  ?                          options and risks were discussed with the patient.  ?                          All questions were answered, and informed consent  ?                          was obtained. Prior Anticoagulants: The patient has  ?                          taken no previous anticoagulant or antiplatelet  ?                          agents. ASA Grade Assessment: III - A patient with  ?  severe systemic disease. After reviewing the risks  ?                          and benefits, the patient was deemed in  ?                          satisfactory condition to undergo the procedure. ?                          After obtaining informed consent, the colonoscope  ?                          was passed under direct vision. Throughout the  ?                           procedure, the patient's blood pressure, pulse, and  ?                          oxygen saturations were monitored continuously. The  ?                          [id number] ([id number]) Olympus colonoscope was  ?                          introduced through the anus and advanced to the the  ?                          terminal ileum, with identification of the  ?                          appendiceal orifice and IC valve. The colonoscopy  ?                          was unusually difficult due to poor bowel prep,  ?                          poor endoscopic visualization secondary to  ?                          patient's diaphragmatic breathing, coughing and  ?                          inability to maintain insufflation, a redundant  ?                          colon, significant looping and a tortuous colon.  ?                          Successful completion of the procedure was aided by  ?                          using manual pressure and withdrawing and  ?  reinserting the scope. The patient tolerated the  ?                          procedure. The quality of the bowel preparation was  ?                          poor. The terminal ileum, ileocecal valve,  ?                          appendiceal orifice, and rectum were photographed. ?Scope In: 7:50:52 AM ?Scope Out: 8:54:34 AM ?Scope Withdrawal Time: 0 hours 40 minutes 33 seconds  ?Total Procedure Duration: 1 hour 3 minutes 42 seconds  ?Findings: ?     Skin tags were found on perianal exam. ?     A 15 mm polyp was found in the transverse colon. The polyp was sessile.  ?     The polyp was removed with a piecemeal technique using a cold snare.  ?     Resection and retrieval were complete. Estimated blood loss was minimal. ?     A 15 mm polyp was found in the splenic flexure. The polyp was sessile.  ?     The polyp was removed with a piecemeal technique using a hot snare. The  ?     polyp was removed with a cold snare. Resection and retrieval were  ?      complete. Estimated blood loss was minimal. ?     Many (15-20) flat and sessile polyps were found in the rectum and  ?     sigmoid colon. The polyps were 3 to 15 mm in size. Two of these polyps  ?     were removed with a cold snare. Resection and retrieval were complete.  ?     Estimated blood loss was minimal. ?     A 12 mm polyp was found in the rectum. The polyp was sessile. The polyp  ?     was removed with a cold snare. Resection and retrieval were complete.  ?     Estimated blood loss was minimal. To prevent bleeding after the  ?     polypectomy, two hemostatic clips were successfully placed (MR  ?     conditional). There was no bleeding at the end of the maneuver. ?     A 6 mm polyp was found in the distal rectum. The polyp was sessile. The  ?     polyp was removed with a cold snare. Resection and retrieval were  ?     complete. Estimated blood loss was minimal. ?     The exam was otherwise normal throughout the examined colon. ?     The terminal ileum appeared normal. ?     Anal papilla(e) were hypertrophied. ?     No additional abnormalities were found on retroflexion. ?Impression:               - Preparation of the colon was poor. ?                          - Perianal skin tags found on perianal exam. ?                          - One 15 mm  polyp in the transverse colon, removed  ?                          piecemeal using a cold snare. Resected and  ?                          retrieved. ?                          - One 15 mm polyp at the splenic flexure, removed  ?                          with a cold snare and removed piecemeal using a hot  ?                          snare. Resected and retrieved. ?                          - Many 3 to 12 mm polyps in the rectum and in the  ?                          sigmoid colon, removed with a cold snare. Resected  ?                          and retrieved. ?                          - One 12 mm polyp in the rectum, removed with a  ?                          cold snare.  Resected and retrieved. Clips (MR  ?                          conditional) were placed. ?                          - One 6 mm polyp in the distal rectum, removed with  ?                          a cold snare. Resected and retrieved. ?                          - The examined portion of the ileum was normal. ?                          - Anal papilla(e) were hypertrophied. ?Moderate Sedation: ?     Not Applicable - Patient had care per Anesthesia. ?Recommendation:           - Patient has a contact number available for  ?                          emergencies. The signs and symptoms of potential  ?  delayed complications were discussed with the  ?                          patient. Return to normal activities tomorrow.  ?                          Written discharge instructions were provided to the  ?                          patient. ?                          - Resume previous diet. ?                          - Continue present medications. ?                          - Await pathology results. ?                          - Repeat colonoscopy in 9 months because the bowel  ?                          preparation was poor and for surveillance after  ?                          piecemeal polypectomy and to potentially remove  ?                          more of the sigmoid polyps. ?Procedure Code(s):        --- Professional --- ?                          [phone], Colonoscopy, flexible; with removal of  ?                          tumor(s), polyp(s), or other lesion(s) by snare  ?                          technique ?Diagnosis Code(s):        --- Professional --- ?                          Z12.11, Encounter for screening for malignant  ?                          neoplasm of colon ?                          K63.5, Polyp of colon ?                          K62.1, Rectal polyp ?                          K62.89, Other specified diseases of anus and rectum ?  K64.4, Residual hemorrhoidal  skin tags ?CPT copyright 2019 American Medical Association. All rights reserved. ?The codes documented in this report are preliminary and upon coder review may  ?be revised to meet current compliance req

## 2021-10-19 NOTE — Anesthesia Postprocedure Evaluation (Signed)
Anesthesia Post Note ? ?Patient: DARIS ARISTIZABAL ? ?Procedure(s) Performed: ESOPHAGOGASTRODUODENOSCOPY (EGD) WITH PROPOFOL ?COLONOSCOPY WITH PROPOFOL ? ?  ? ?Patient location during evaluation: PACU ?Anesthesia Type: MAC ?Level of consciousness: awake and alert ?Pain management: pain level controlled ?Vital Signs Assessment: post-procedure vital signs reviewed and stable ?Respiratory status: spontaneous breathing, nonlabored ventilation, respiratory function stable and patient connected to nasal cannula oxygen ?Cardiovascular status: stable and blood pressure returned to baseline ?Postop Assessment: no apparent nausea or vomiting ?Anesthetic complications: no ? ? ?No notable events documented. ? ?Last Vitals:  ?Vitals:  ? 10/19/21 0901 10/19/21 0913  ?BP: (!) 140/43 (!) 151/58  ?Pulse: 95 85  ?Resp: (!) 21 (!) 21  ?Temp: 36.7 ?C   ?SpO2:  93%  ?  ?Last Pain:  ?Vitals:  ? 10/19/21 0913  ?TempSrc:   ?PainSc: 0-No pain  ? ? ?  ?  ?  ?  ?  ?  ? ?Gen[place] Pallas S ? ? ? ? ?

## 2021-10-20 ENCOUNTER — Encounter (HOSPITAL_COMMUNITY): Payer: Self-pay | Admitting: Gastroenterology

## 2021-10-20 LAB — SURGICAL PATHOLOGY

## 2021-10-24 ENCOUNTER — Other Ambulatory Visit: Payer: Self-pay | Admitting: Family Medicine

## 2021-10-25 NOTE — Progress Notes (Signed)
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  This is usually secondary of the duodenum to stomach acid exposure.  I does not increase her risk for any significant complications or cancer.   ?The biopsies of your stomach were normal.  There was no inflammation and no evidence of H. pylori infection. 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  In general, hyperplastic polyps are not felt to be precancerous.  However, there are situations in which hyperplastic polyps are thought to pose an increased risk of colon cancer.  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  It is recommended that you undergo more intensive colon cancer screenings, every 1 to 3 years. ?I have recommended you repeat a colonoscopy in 9 months because of the poor bowel prep quality and also to reexamine the large polyps that were removed in a piecemeal fashion. ?We will contact you around that time to set up a repeat colonoscopy in the hospital setting. ? ?I recommend you make a follow-up clinic appointment to discuss these findings and this diagnosis further. ? ?Pedro Oldenburg E. Candis Schatz, MD ?[facility] ? ?[name], ?Can you please put Sarah [name] down for a repeat colonoscopy in the hospital in 9 months? ?Also, can you make a routine referral to ENT to further evaluate abnormal appearing laryngeal mucosa seen during endoscopy? ? ?Thanks

## 2021-10-27 ENCOUNTER — Other Ambulatory Visit: Payer: Self-pay | Admitting: Family Medicine

## 2021-10-27 DIAGNOSIS — Z1231 Encounter for screening mammogram for malignant neoplasm of breast: Secondary | ICD-10-CM

## 2021-11-06 ENCOUNTER — Ambulatory Visit (INDEPENDENT_AMBULATORY_CARE_PROVIDER_SITE_OTHER): Payer: Medicare Other | Admitting: Podiatry

## 2021-11-06 DIAGNOSIS — L84 Corns and callosities: Secondary | ICD-10-CM | POA: Diagnosis not present

## 2021-11-06 DIAGNOSIS — B351 Tinea unguium: Secondary | ICD-10-CM

## 2021-11-06 DIAGNOSIS — M79675 Pain in left toe(s): Secondary | ICD-10-CM | POA: Diagnosis not present

## 2021-11-06 DIAGNOSIS — E1140 Type 2 diabetes mellitus with diabetic neuropathy, unspecified: Secondary | ICD-10-CM

## 2021-11-06 DIAGNOSIS — M79674 Pain in right toe(s): Secondary | ICD-10-CM

## 2021-11-06 MED ORDER — GABAPENTIN 100 MG PO CAPS: 100.0000 mg | ORAL_CAPSULE | Freq: Every day | ORAL | 0 refills | Status: DC

## 2021-11-06 NOTE — Patient Instructions (Addendum)
You can use "urea nail gel" to help the thick toenails  ---  Diabetes Mellitus and Foot Care Foot care is an important part of your health, especially when you have diabetes. Diabetes may cause you to have problems because of poor blood flow (circulation) to your feet and legs, which can cause your skin to: Become thinner and drier. Break more easily. Heal more slowly. Peel and crack. You may also have nerve damage (neuropathy) in your legs and feet, causing decreased feeling in them. This means that you may not notice minor injuries to your feet that could lead to more serious problems. Noticing and addressing any potential problems early is the best way to prevent future foot problems. How to care for your feet Foot hygiene  Wash your feet daily with warm water and mild soap. Do not use hot water. Then, pat your feet and the areas between your toes until they are completely dry. Do not soak your feet as this can dry your skin. Trim your toenails straight across. Do not dig under them or around the cuticle. File the edges of your nails with an emery board or nail file. Apply a moisturizing lotion or petroleum jelly to the skin on your feet and to dry, brittle toenails. Use lotion that does not contain alcohol and is unscented. Do not apply lotion between your toes. Shoes and socks Wear clean socks or stockings every day. Make sure they are not too tight. Do not wear knee-high stockings since they may decrease blood flow to your legs. Wear shoes that fit properly and have enough cushioning. Always look in your shoes before you put them on to be sure there are no objects inside. To break in new shoes, wear them for just a few hours a day. This prevents injuries on your feet. Wounds, scrapes, corns, and calluses  Check your feet daily for blisters, cuts, bruises, sores, and redness. If you cannot see the bottom of your feet, use a mirror or ask someone for help. Do not cut corns or calluses or  try to remove them with medicine. If you find a minor scrape, cut, or break in the skin on your feet, keep it and the skin around it clean and dry. You may clean these areas with mild soap and water. Do not clean the area with peroxide, alcohol, or iodine. If you have a wound, scrape, corn, or callus on your foot, look at it several times a day to make sure it is healing and not infected. Check for: Redness, swelling, or pain. Fluid or blood. Warmth. Pus or a bad smell. General tips Do not cross your legs. This may decrease blood flow to your feet. Do not use heating pads or hot water bottles on your feet. They may burn your skin. If you have lost feeling in your feet or legs, you may not know this is happening until it is too late. Protect your feet from hot and cold by wearing shoes, such as at the beach or on hot pavement. Schedule a complete foot exam at least once a year (annually) or more often if you have foot problems. Report any cuts, sores, or bruises to your health care provider immediately. Where to find more information American Diabetes Association: www.diabetes.org Association of Diabetes Care & Education Specialists: www.diabeteseducator.org Contact a health care provider if: You have a medical condition that increases your risk of infection and you have any cuts, sores, or bruises on your feet. You have an  injury that is not healing. You have redness on your legs or feet. You feel burning or tingling in your legs or feet. You have pain or cramps in your legs and feet. Your legs or feet are numb. Your feet always feel cold. You have pain around any toenails. Get help right away if: You have a wound, scrape, corn, or callus on your foot and: You have pain, swelling, or redness that gets worse. You have fluid or blood coming from the wound, scrape, corn, or callus. Your wound, scrape, corn, or callus feels warm to the touch. You have pus or a bad smell coming from the  wound, scrape, corn, or callus. You have a fever. You have a red line going up your leg. Summary Check your feet every day for blisters, cuts, bruises, sores, and redness. Apply a moisturizing lotion or petroleum jelly to the skin on your feet and to dry, brittle toenails. Wear shoes that fit properly and have enough cushioning. If you have foot problems, report any cuts, sores, or bruises to your health care provider immediately. Schedule a complete foot exam at least once a year (annually) or more often if you have foot problems. This information is not intended to replace advice given to you by your health care provider. Make sure you discuss any questions you have with your health care provider. Document Revised: 12/24/2019 Document Reviewed: 12/24/2019 Elsevier Patient Education  [place].  ---  Gabapentin Capsules or Tablets What is this medication? GABAPENTIN (GA ba pen tin) treats nerve pain. It may also be used to prevent and control seizures in people with epilepsy. It works by calming overactive nerves in your body. This medicine may be used for other purposes; ask your health care provider or pharmacist if you have questions. COMMON BRAND NAME(S): Active-PAC with Gabapentin, Orpha Bur, Gralise, Neurontin What should I tell my care team before I take this medication? They need to know if you have any of these conditions: Alcohol or substance use disorder Kidney disease Lung or breathing disease Suicidal thoughts, plans, or attempt; a previous suicide attempt by you or a family member An unusual or allergic reaction to gabapentin, other medications, foods, dyes, or preservatives Pregnant or trying to get pregnant Breast-feeding How should I use this medication? Take this medication by mouth with a glass of water. Follow the directions on the prescription label. You can take it with or without food. If it upsets your stomach, take it with food. Take your medication at  regular intervals. Do not take it more often than directed. Do not stop taking except on your care team's advice. If you are directed to break the 600 or 800 mg tablets in half as part of your dose, the extra half tablet should be used for the next dose. If you have not used the extra half tablet within 28 days, it should be thrown away. A special MedGuide will be given to you by the pharmacist with each prescription and refill. Be sure to read this information carefully each time. Talk to your care team about the use of this medication in children. While this medication may be prescribed for children as young as 3 years for selected conditions, precautions do apply. Overdosage: If you think you have taken too much of this medicine contact a poison control center or emergency room at once. NOTE: This medicine is only for you. Do not share this medicine with others. What if I miss a dose? If you miss  a dose, take it as soon as you can. If it is almost time for your next dose, take only that dose. Do not take double or extra doses. What may interact with this medication? Alcohol Antihistamines for allergy, cough, and cold Certain medications for anxiety or sleep Certain medications for depression like amitriptyline, fluoxetine, sertraline Certain medications for seizures like phenobarbital, primidone Certain medications for stomach problems General anesthetics like halothane, isoflurane, methoxyflurane, propofol Local anesthetics like lidocaine, pramoxine, tetracaine Medications that relax muscles for surgery Opioid medications for pain Phenothiazines like chlorpromazine, mesoridazine, prochlorperazine, thioridazine This list may not describe all possible interactions. Give your health care provider a list of all the medicines, herbs, non-prescription drugs, or dietary supplements you use. Also tell them if you smoke, drink alcohol, or use illegal drugs. Some items may interact with your  medicine. What should I watch for while using this medication? Visit your care team for regular checks on your progress. You may want to keep a record at home of how you feel your condition is responding to treatment. You may want to share this information with your care team at each visit. You should contact your care team if your seizures get worse or if you have any new types of seizures. Do not stop taking this medication or any of your seizure medications unless instructed by your care team. Stopping your medication suddenly can increase your seizures or their severity. This medication may cause serious skin reactions. They can happen weeks to months after starting the medication. Contact your care team right away if you notice fevers or flu-like symptoms with a rash. The rash may be red or purple and then turn into blisters or peeling of the skin. Or, you might notice a red rash with swelling of the face, lips or lymph nodes in your neck or under your arms. Wear a medical identification bracelet or chain if you are taking this medication for seizures. Carry a card that lists all your medications. This medication may affect your coordination, reaction time, or judgment. Do not drive or operate machinery until you know how this medication affects you. Sit up or stand slowly to reduce the risk of dizzy or fainting spells. Drinking alcohol with this medication can increase the risk of these side effects. Your mouth may get dry. Chewing sugarless gum or sucking hard candy, and drinking plenty of water may help. Watch for new or worsening thoughts of suicide or depression. This includes sudden changes in mood, behaviors, or thoughts. These changes can happen at any time but are more common in the beginning of treatment or after a change in dose. Call your care team right away if you experience these thoughts or worsening depression. If you become pregnant while using this medication, you may enroll in the  [place] Pregnancy Registry by calling [phone]. This registry collects information about the safety of antiepileptic medication use during pregnancy. What side effects may I notice from receiving this medication? Side effects that you should report to your care team as soon as possible: Allergic reactions or angioedema--skin rash, itching, hives, swelling of the face, eyes, lips, tongue, arms, or legs, trouble swallowing or breathing Rash, fever, and swollen lymph nodes Thoughts of suicide or self harm, worsening mood, feelings of depression Trouble breathing Unusual changes in mood or behavior in children after use such as difficulty concentrating, hostility, or restlessness Side effects that usually do not require medical attention (report to your care team if they continue  or are bothersome): Dizziness Drowsiness Nausea Swelling of ankles, feet, or hands Vomiting This list may not describe all possible side effects. Call your doctor for medical advice about side effects. You may report side effects to FDA at 1-800-FDA-1088. Where should I keep my medication? Keep out of reach of children and pets. Store at room temperature between 15 and 30 degrees C (59 and 86 degrees F). Get rid of any unused medication after the expiration date. This medication may cause accidental overdose and death if taken by other adults, children, or pets. To get rid of medications that are no longer needed or have expired: Take the medication to a medication take-back program. Check with your pharmacy or law enforcement to find a location. If you cannot return the medication, check the label or package insert to see if the medication should be thrown out in the garbage or flushed down the toilet. If you are not sure, ask your care team. If it is safe to put it in the trash, empty the medication out of the container. Mix the medication with cat litter, dirt, coffee grounds, or other  unwanted substance. Seal the mixture in a bag or container. Put it in the trash. NOTE: This sheet is a summary. It may not cover all possible information. If you have questions about this medicine, talk to your doctor, pharmacist, or health care provider.  2023 Elsevier/Gold Standard (2020-12-05 00:00:00)

## 2021-11-08 ENCOUNTER — Ambulatory Visit: Payer: Medicare Other

## 2021-11-08 NOTE — Progress Notes (Signed)
Subjective:   Patient ID: Sarah [name], female   DOB: 66 y.o.   MRN: [id number]   HPI 66 year old female presents the office today for concerns of thick, elongated toenails that she is not able to trim her self as well as for calluses.  No open lesions that she reports.  She does have neuropathy.  She is currently not taking anything for neuropathy.  She denies numbness and tingling and she feels that she is wearing shoes and socks all the time.  She does not check her glucose and she is not sure of her A1c.  Last A1c that I can see was 8.9 on November 24, 2020   Review of Systems  All other systems reviewed and are negative.  Past Medical History:  Diagnosis Date   Acute pancreatitis    01-13-2015 per ED note   Adrenal adenoma    bilateral   Arthritis    Bilateral lower extremity edema    Complication of anesthesia    post-op 07-20-2014 surgery post-op Hypoxia due to acute CHF/ mild Pulmonary edema   COPD (chronic obstructive pulmonary disease) ([place])    Gastritis    01-13-2015  per ED note   GERD (gastroesophageal reflux disease)    Headache    History of acute congestive heart failure    07-22-2014   post-op surgery 07-20-2014   History of acute pancreatitis    01-04-2013    History of alcohol abuse    state quit 2002/  drank daily >fifth of gin   History of DVT of lower extremity    2000  post op left knee surgery   History of pulmonary edema    mild -  07-22-2014  post-op  surgery 07-20-2014   Hypertension    Type 2 diabetes mellitus (HCC)    Ulnar nerve compression    right elbow    Past Surgical History:  Procedure Laterality Date   BIOPSY  10/19/2021   Procedure: BIOPSY;  Surgeon: Daryel November, MD;  Location: [facility];  Service: Gastroenterology;;   COLONOSCOPY WITH PROPOFOL N/A 10/19/2021   Procedure: COLONOSCOPY WITH PROPOFOL;  Surgeon: Daryel November, MD;  Location: [facility];  Service: Gastroenterology;  Laterality: N/A;   DILATION AND CURETTAGE  OF UTERUS     ESOPHAGOGASTRODUODENOSCOPY (EGD) WITH PROPOFOL N/A 10/19/2021   Procedure: ESOPHAGOGASTRODUODENOSCOPY (EGD) WITH PROPOFOL;  Surgeon: Daryel November, MD;  Location: [facility];  Service: Gastroenterology;  Laterality: N/A;   HEMOSTASIS CLIP PLACEMENT  10/19/2021   Procedure: HEMOSTASIS CLIP PLACEMENT;  Surgeon: Daryel November, MD;  Location: [facility];  Service: Gastroenterology;;   INSERTION OF MESH N/A 07/20/2014   Procedure: INSERTION OF MESH;  Surgeon: Fanny Skates, MD;  Location: [facility];  Service: General;  Laterality: N/A;   KNEE ARTHROSCOPY Left 2000   POLYPECTOMY  10/19/2021   Procedure: POLYPECTOMY;  Surgeon: Daryel November, MD;  Location: [facility];  Service: Gastroenterology;;   TOTAL ABDOMINAL HYSTERECTOMY W/ BILATERAL SALPINGOOPHORECTOMY  1995   TRANSTHORACIC ECHOCARDIOGRAM  07-22-2014   mild LVH,  60-65%,  grade I diastolic dysfunction/  mild LAE   VENTRAL HERNIA REPAIR N/A 07/20/2014   Procedure: LAPAROSCOPIC REPAIR INCARCARATED VENTRAL HERNIA AND UMBILICAL HERNIA;  Surgeon: Fanny Skates, MD;  Location: [facility];  Service: General;  Laterality: N/A;     Current Outpatient Medications:    gabapentin (NEURONTIN) 100 MG capsule, Take 1 capsule (100 mg total) by mouth at bedtime., Disp: 90 capsule, Rfl: 0   acetaminophen (  TYLENOL) 500 MG tablet, Take 1,000 mg by mouth every 6 (six) hours as needed for moderate pain or mild pain., Disp: , Rfl:    acetaminophen (TYLENOL) 650 MG CR tablet, Take 1,300 mg by mouth every 8 (eight) hours as needed for pain., Disp: , Rfl:    albuterol (ACCUNEB) 1.25 MG/3ML nebulizer solution, Take 1 ampule by nebulization 3 (three) times daily as needed for wheezing or shortness of breath., Disp: , Rfl:    albuterol (PROAIR HFA) 108 (90 Base) MCG/ACT inhaler, Inhale 2 puffs into the lungs every 4 (four) hours as needed for wheezing or shortness of breath., Disp: , Rfl:    allopurinol (ZYLOPRIM) 300 MG tablet, Take 300 mg by  mouth daily., Disp: , Rfl:    amLODipine (NORVASC) 10 MG tablet, Take 1 tablet (10 mg total) by mouth daily., Disp: 90 tablet, Rfl: 3   aspirin EC 81 MG tablet, Take 1 tablet (81 mg total) by mouth daily., Disp: 90 tablet, Rfl: 3   ATROVENT HFA 17 MCG/ACT inhaler, Inhale 2 puffs into the lungs every 6 (six) hours as needed for wheezing., Disp: , Rfl:    blood glucose meter kit and supplies KIT, Libra Freestyle meter, Disp: 1 each, Rfl: 0   Blood Glucose Monitoring Suppl (ONE TOUCH ULTRA MINI) w/Device KIT, Use as directed to test blood sugar once daily. DX code E11.9, Disp: 1 each, Rfl: 0   brimonidine-timolol (COMBIGAN) 0.2-0.5 % ophthalmic solution, Apply 1 drop to eye 2 (two) times daily., Disp: , Rfl:    cyclobenzaprine (FLEXERIL) 5 MG tablet, Take 5 mg by mouth 3 (three) times daily as needed for muscle spasms., Disp: , Rfl:    escitalopram (LEXAPRO) 10 MG tablet, Take 1 tablet (10 mg total) by mouth daily., Disp: , Rfl:    fluticasone (FLONASE) 50 MCG/ACT nasal spray, Place 2 sprays into both nostrils daily., Disp: 16 g, Rfl: 6   glimepiride (AMARYL) 1 MG tablet, Take 1 tablet (1 mg total) by mouth 2 (two) times daily. (Patient not taking: Reported on 10/13/2021), Disp: , Rfl:    glipiZIDE (GLUCOTROL) 10 MG tablet, Take 10 mg by mouth 2 (two) times daily., Disp: , Rfl:    glucose blood (ONE TOUCH ULTRA TEST) test strip, Use as directed to test blood sugar once daily. DX code E11.9, Disp: 100 each, Rfl: 12   hydrochlorothiazide (HYDRODIURIL) 25 MG tablet, Take 1 tablet (25 mg total) by mouth daily., Disp: 90 tablet, Rfl: 0   lisinopril (PRINIVIL,ZESTRIL) 5 MG tablet, Take 1 tablet (5 mg total) by mouth every morning., Disp: 90 tablet, Rfl: 1   loteprednol (LOTEMAX) 0.2 % SUSP, Place 1 drop into the right eye 2 (two) times daily., Disp: , Rfl:    LUMIGAN 0.01 % SOLN, Place 1 drop into both eyes at bedtime., Disp: 5 mL, Rfl: 0   meloxicam (MOBIC) 15 MG tablet, Take 15 mg by mouth daily., Disp: ,  Rfl:    metFORMIN (GLUCOPHAGE) 1000 MG tablet, Take 1,000 mg by mouth 2 (two) times daily with a meal., Disp: , Rfl:    omeprazole (PRILOSEC) 20 MG capsule, Take 20 mg by mouth daily., Disp: , Rfl:    ONETOUCH DELICA LANCETS 33G MISC, Use as directed to test blood sugar once daily. DX code E11.9, Disp: 100 each, Rfl: 12   pravastatin (PRAVACHOL) 20 MG tablet, Take 1 tablet (20 mg total) by mouth daily., Disp: 90 tablet, Rfl: 3   sodium chloride (OCEAN) 0.65 % SOLN nasal   spray, Place 1 spray into both nostrils 2 (two) times daily. (Patient not taking: Reported on 10/13/2021), Disp: 1 mL, Rfl: 0  Allergies  Allergen Reactions   Aspirin Other (See Comments)    Causes her stomach pain.   Ibuprofen Other (See Comments)    Causes stomach pain   Penicillins Other (See Comments)    Localized whelps and redness after injection Has patient had a PCN reaction causing immediate rash, facial/tongue/throat swelling, SOB or lightheadedness with hypotension: No Has patient had a PCN reaction causing severe rash involving mucus membranes or skin necrosis: Yes Has patient had a PCN reaction that required hospitalization No Has patient had a PCN reaction occurring within the last 10 years: No If all of the above answers are "NO", then may proceed with Cephalosporin use.          Objective:  Physical Exam  General: AAO x3, NAD  Dermatological: Nails are hypertrophic, dystrophic, brittle, discolored, elongated 10. No surrounding redness or drainage. Tenderness nails 1-5 bilaterally.  Hyperkeratotic lesion bilateral submetatarsal area without any underlying ulceration drainage or signs of infection.  No open lesions.  Vascular: Dorsalis Pedis artery and Posterior Tibial artery pedal pulses are palpable bilateral with immedate capillary fill time.  There is no pain with calf compression, swelling, warmth, erythema.   Neruologic: Sensation decreased with Semmes Weinstein monofilament.  Musculoskeletal:   Muscular strength 5/5 in all groups tested bilateral.    Assessment:   Symptomatic onychosis, hyperkeratotic lesions, type 2 diabetes with neuropathy     Plan:  -Treatment options discussed including all alternatives, risks, and complications -Etiology of symptoms were discussed -Nails debrided 10 without complications or bleeding. -Secondary to hyperkeratotic lesions without any complications or bleeding. Recommend moisturizer and offloading daily. -Regards to neuropathy we discussed different treatment options.  She was a started medication.  We will start low-dose gabapentin at nighttime discussed side effects and can titrate up if needed. -Daily foot inspection -Follow-up in 3 months or sooner if any problems arise. In the meantime, encouraged to call the office with any questions, concerns, change in symptoms.   Celesta Gentile, DPM

## 2021-11-09 ENCOUNTER — Inpatient Hospital Stay: Admission: RE | Admit: 2021-11-09 | Payer: Medicare Other | Source: Ambulatory Visit

## 2021-11-21 ENCOUNTER — Ambulatory Visit: Payer: Medicare Other

## 2021-11-22 ENCOUNTER — Ambulatory Visit: Payer: Medicare Other | Admitting: Gastroenterology

## 2021-12-04 ENCOUNTER — Other Ambulatory Visit: Payer: Self-pay | Admitting: Family Medicine

## 2021-12-04 DIAGNOSIS — Z122 Encounter for screening for malignant neoplasm of respiratory organs: Secondary | ICD-10-CM

## 2021-12-25 ENCOUNTER — Encounter: Payer: Self-pay | Admitting: Gastroenterology

## 2021-12-25 ENCOUNTER — Ambulatory Visit (INDEPENDENT_AMBULATORY_CARE_PROVIDER_SITE_OTHER): Payer: Medicare Other | Admitting: Gastroenterology

## 2021-12-25 VITALS — BP 140/64 | HR 91 | Ht 68.0 in | Wt 220.0 lb

## 2021-12-25 DIAGNOSIS — D126 Benign neoplasm of colon, unspecified: Secondary | ICD-10-CM

## 2021-12-25 DIAGNOSIS — K219 Gastro-esophageal reflux disease without esophagitis: Secondary | ICD-10-CM

## 2021-12-25 DIAGNOSIS — Z86010 Personal history of colonic polyps: Secondary | ICD-10-CM

## 2021-12-25 DIAGNOSIS — R159 Full incontinence of feces: Secondary | ICD-10-CM | POA: Diagnosis not present

## 2021-12-25 NOTE — Progress Notes (Signed)
HPI : Sarah [name] is a very pleasant 66 year old female with a history of COPD, chronic tobacco use and remote history of alcohol-induced pancreatitis who I initially saw in clinic December 2022 for symptoms of epigastric pain.  She had also been having symptoms of fecal incontinence characterized by seepage of stool with coughing, laughing and certain activities and movements.  She underwent an upper and lower endoscopy on May 4th.  The EGD was notable for prominent mucosal folds in the duodenum with biopsies consistent with peptic duodenitis.  Otherwise EGD was normal.  The colonoscopy was technically difficult due to a tortuous, looping colon, as well as poor visualization secondary to poor bowel prep and persistent coughing and diaphragmatic breathing.  Two large flat polyps were removed in the transverse colon and numerous flat polyps were seen in the left colon, with 2 polyps >10 mm being removed, all of which were hyperplastic polyps. Today, the patient reports she is doing well.  She has occasional heartburn but her severe epigastric pain that was bothering her last year has not returned.  She estimates having symptoms 1-2x/week. She apparently has two prescriptions for omeprazole and is confused as to how she should be taking it. She continues to have problems with stress fecal incontinence.  She took Metamucil daily for a while but did not notice a difference. Bowel movements are regular.  Stool is typically formed.  Past Medical History:  Diagnosis Date   Acute pancreatitis    01-13-2015 per ED note   Adrenal adenoma    bilateral   Arthritis    Bilateral lower extremity edema    Complication of anesthesia    post-op 07-20-2014 surgery post-op Hypoxia due to acute CHF/ mild Pulmonary edema   COPD (chronic obstructive pulmonary disease) (HCC)    Gastritis    01-13-2015  per ED note   GERD (gastroesophageal reflux disease)    Headache    History of acute congestive heart failure     07-22-2014   post-op surgery 07-20-2014   History of acute pancreatitis    01-04-2013    History of alcohol abuse    state quit 2002/  drank daily >fifth of gin   History of DVT of lower extremity    2000  post op left knee surgery   History of pulmonary edema    mild -  07-22-2014  post-op  surgery 07-20-2014   Hypertension    Type 2 diabetes mellitus (HCC)    Ulnar nerve compression    right elbow     Past Surgical History:  Procedure Laterality Date   BIOPSY  10/19/2021   Procedure: BIOPSY;  Surgeon: Jenel Lucks, MD;  Location: [facility];  Service: Gastroenterology;;   COLONOSCOPY WITH PROPOFOL N/A 10/19/2021   Procedure: COLONOSCOPY WITH PROPOFOL;  Surgeon: Jenel Lucks, MD;  Location: [facility];  Service: Gastroenterology;  Laterality: N/A;   DILATION AND CURETTAGE OF UTERUS     ESOPHAGOGASTRODUODENOSCOPY (EGD) WITH PROPOFOL N/A 10/19/2021   Procedure: ESOPHAGOGASTRODUODENOSCOPY (EGD) WITH PROPOFOL;  Surgeon: Jenel Lucks, MD;  Location: [facility];  Service: Gastroenterology;  Laterality: N/A;   HEMOSTASIS CLIP PLACEMENT  10/19/2021   Procedure: HEMOSTASIS CLIP PLACEMENT;  Surgeon: Jenel Lucks, MD;  Location: [facility];  Service: Gastroenterology;;   INSERTION OF MESH N/A 07/20/2014   Procedure: INSERTION OF MESH;  Surgeon: Claud Kelp, MD;  Location: [facility];  Service: General;  Laterality: N/A;   KNEE ARTHROSCOPY Left 2000   POLYPECTOMY  10/19/2021   Procedure: POLYPECTOMY;  Surgeon: Jenel Lucks, MD;  Location: [facility];  Service: Gastroenterology;;   TOTAL ABDOMINAL HYSTERECTOMY W/ BILATERAL SALPINGOOPHORECTOMY  1995   TRANSTHORACIC ECHOCARDIOGRAM  07-22-2014   mild LVH,  60-65%,  grade I diastolic dysfunction/  mild LAE   VENTRAL HERNIA REPAIR N/A 07/20/2014   Procedure: LAPAROSCOPIC REPAIR INCARCARATED VENTRAL HERNIA AND UMBILICAL HERNIA;  Surgeon: Claud Kelp, MD;  Location: [facility];  Service: General;  Laterality: N/A;    Family History  Problem Relation Age of Onset   Coronary artery disease Father    Obesity Sister    Cerebral aneurysm Maternal Grandmother        hemorrhage   Heart disease Daughter        died at birth   Colon cancer Maternal Uncle    Social History   Tobacco Use   Smoking status: Every Day    Packs/day: 1.50    Years: 40.00    Total pack years: 60.00    Types: Cigarettes   Smokeless tobacco: Never  Vaping Use   Vaping Use: Never used  Substance Use Topics   Alcohol use: No    Comment: hx alcohol abuse- drank >fifth of gin daily/  states Quit 2002   Drug use: No   Current Outpatient Medications  Medication Sig Dispense Refill   acetaminophen (TYLENOL) 500 MG tablet Take 1,000 mg by mouth every 6 (six) hours as needed for moderate pain or mild pain.     acetaminophen (TYLENOL) 650 MG CR tablet Take 1,300 mg by mouth every 8 (eight) hours as needed for pain.     albuterol (ACCUNEB) 1.25 MG/3ML nebulizer solution Take 1 ampule by nebulization 3 (three) times daily as needed for wheezing or shortness of breath.     albuterol (PROAIR HFA) 108 (90 Base) MCG/ACT inhaler Inhale 2 puffs into the lungs every 4 (four) hours as needed for wheezing or shortness of breath.     allopurinol (ZYLOPRIM) 300 MG tablet Take 300 mg by mouth daily.     amLODipine (NORVASC) 10 MG tablet Take 1 tablet (10 mg total) by mouth daily. 90 tablet 3   aspirin EC 81 MG tablet Take 1 tablet (81 mg total) by mouth daily. 90 tablet 3   ATROVENT HFA 17 MCG/ACT inhaler Inhale 2 puffs into the lungs every 6 (six) hours as needed for wheezing.     blood glucose meter kit and supplies KIT Libra Freestyle meter 1 each 0   Blood Glucose Monitoring Suppl (ONE TOUCH ULTRA MINI) w/Device KIT Use as directed to test blood sugar once daily. DX code E11.9 1 each 0   brimonidine-timolol (COMBIGAN) 0.2-0.5 % ophthalmic solution Apply 1 drop to eye 2 (two) times daily.     cyclobenzaprine (FLEXERIL) 5 MG tablet Take 5 mg  by mouth 3 (three) times daily as needed for muscle spasms.     escitalopram (LEXAPRO) 10 MG tablet Take 1 tablet (10 mg total) by mouth daily.     fluticasone (FLONASE) 50 MCG/ACT nasal spray Place 2 sprays into both nostrils daily. 16 g 6   gabapentin (NEURONTIN) 100 MG capsule Take 1 capsule (100 mg total) by mouth at bedtime. 90 capsule 0   glimepiride (AMARYL) 1 MG tablet Take 1 tablet (1 mg total) by mouth 2 (two) times daily. (Patient not taking: Reported on 10/13/2021)     glipiZIDE (GLUCOTROL) 10 MG tablet Take 10 mg by mouth 2 (two) times daily.  glucose blood (ONE TOUCH ULTRA TEST) test strip Use as directed to test blood sugar once daily. DX code E11.9 100 each 12   hydrochlorothiazide (HYDRODIURIL) 25 MG tablet Take 1 tablet (25 mg total) by mouth daily. 90 tablet 0   lisinopril (PRINIVIL,ZESTRIL) 5 MG tablet Take 1 tablet (5 mg total) by mouth every morning. 90 tablet 1   loteprednol (LOTEMAX) 0.2 % SUSP Place 1 drop into the right eye 2 (two) times daily.     LUMIGAN 0.01 % SOLN Place 1 drop into both eyes at bedtime. 5 mL 0   meloxicam (MOBIC) 15 MG tablet Take 15 mg by mouth daily.     metFORMIN (GLUCOPHAGE) 1000 MG tablet Take 1,000 mg by mouth 2 (two) times daily with a meal.     omeprazole (PRILOSEC) 20 MG capsule Take 20 mg by mouth daily.     ONETOUCH DELICA LANCETS 33G MISC Use as directed to test blood sugar once daily. DX code E11.9 100 each 12   pravastatin (PRAVACHOL) 20 MG tablet Take 1 tablet (20 mg total) by mouth daily. 90 tablet 3   sodium chloride (OCEAN) 0.65 % SOLN nasal spray Place 1 spray into both nostrils 2 (two) times daily. (Patient not taking: Reported on 10/13/2021) 1 mL 0   No current facility-administered medications for this visit.   Allergies  Allergen Reactions   Aspirin Other (See Comments)    Causes her stomach pain.   Ibuprofen Other (See Comments)    Causes stomach pain   Penicillins Other (See Comments)    Localized whelps and redness  after injection Has patient had a PCN reaction causing immediate rash, facial/tongue/throat swelling, SOB or lightheadedness with hypotension: No Has patient had a PCN reaction causing severe rash involving mucus membranes or skin necrosis: Yes Has patient had a PCN reaction that required hospitalization No Has patient had a PCN reaction occurring within the last 10 years: No If all of the above answers are "NO", then may proceed with Cephalosporin use.      Review of Systems: All systems reviewed and negative except where noted in HPI.    No results found.  Physical Exam: BP 140/64   Pulse 91   Ht 5\' 8"  (1.727 m)   Wt 220 lb (99.8 kg)   BMI 33.45 kg/m  Constitutional: Pleasant,well-developed, African American female in no acute distress.  Uses cane for ambulation HEENT: Normocephalic and atraumatic. Conjunctivae are normal. No scleral icterus. Neck supple.  Cardiovascular: Normal rate, regular rhythm.  Pulmonary/chest: Effort normal and breath sounds normal. Expiratory wheezes present. Abdominal: Soft, nondistended, nontender. Bowel sounds active throughout. There are no masses palpable. No hepatomegaly. Extremities: no edema Neurological: Alert and oriented to person place and time. Skin: Skin is warm and dry. No rashes noted. Psychiatric: Normal mood and affect. Behavior is normal.  CBC    Component Value Date/Time   WBC 7.9 04/10/2021 2202   RBC 5.50 (H) 04/10/2021 2202   HGB 14.5 04/10/2021 2202   HGB 12.9 09/20/2017 1153   HCT 45.0 04/10/2021 2202   HCT 39.1 09/20/2017 1153   PLT 289 04/10/2021 2202   PLT 277 09/20/2017 1153   MCV 81.8 04/10/2021 2202   MCV 80 09/20/2017 1153   MCH 26.4 04/10/2021 2202   MCHC 32.2 04/10/2021 2202   RDW 15.9 (H) 04/10/2021 2202   RDW 15.1 09/20/2017 1153   LYMPHSABS 3.3 04/10/2021 2202   MONOABS 0.5 04/10/2021 2202   EOSABS 0.2 04/10/2021 2202  BASOSABS 0.1 04/10/2021 2202    CMP     Component Value Date/Time   NA 133  (L) 04/10/2021 2202   NA 142 09/20/2017 1153   K 3.9 04/10/2021 2202   CL 97 (L) 04/10/2021 2202   CO2 26 04/10/2021 2202   GLUCOSE 287 (H) 04/10/2021 2202   BUN 8 04/10/2021 2202   BUN 13 09/20/2017 1153   CREATININE 0.70 04/10/2021 2202   CREATININE 0.83 07/02/2016 1022   CALCIUM 9.4 04/10/2021 2202   PROT 7.1 04/10/2021 2202   PROT 7.0 09/20/2017 1153   ALBUMIN 3.8 04/10/2021 2202   ALBUMIN 4.2 09/20/2017 1153   AST 13 (L) 04/10/2021 2202   ALT 17 04/10/2021 2202   ALKPHOS 115 04/10/2021 2202   BILITOT 0.6 04/10/2021 2202   BILITOT 0.6 09/20/2017 1153   GFRNONAA >60 04/10/2021 2202   GFRNONAA 76 07/02/2016 1022   GFRAA >60 11/05/2017 2259   GFRAA 87 07/02/2016 1022     ASSESSMENT AND PLAN: 66 year old female with 2 large right sided hyperplastic polyps and numerous (>20) polyps in the left colon, 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  Her colonoscopy was limited by a poor bowel prep and impaired visualization related to excessive coughing, difficulty maintaining insufflation and diaphragmatic breathing.  It was recommended she repeat colonoscopy in 9 months, and this should again be performed in the hospital setting.  Although guidelines recommend surveillance colonoscopy every 1-2 years, given her poor functional status, I am not sure that the benefits of frequent colonoscopy would outweigh the benefits.  Will reassess after next colonoscopy, which hopefully will provide a better examination. Regarding her stress fecal incontinence, I recommended she meet with a pelvic floor physical therapist to improve her symptoms.   For her GERD, I recommended she continue taking omeprazole daily, and to then use TUMS or Pepcid as needed when she has breakthrough symptoms.  Serrated polyposis syndrome - Repeat colonoscopy Feb 2024 in hospital - 2-day bowel prep - Will make further surveillance recommendations following this colonoscopy  Stress fecal incontinence - Pelvic  floor PT  GERD - Continue omeprazole daily with PRN TUMS or Pepcid  Asha Grumbine E. Tomasa Rand, MD [facility]   CC:  Cain Saupe, MD

## 2021-12-25 NOTE — Patient Instructions (Signed)
If you are age 68 or older, your body mass index should be between 23-30. Your Body mass index is 33.45 kg/m. If this is out of the aforementioned range listed, please consider follow up with your Primary Care Provider.  If you are age 54 or younger, your body mass index should be between 19-25. Your Body mass index is 33.45 kg/m. If this is out of the aformentioned range listed, please consider follow up with your Primary Care Provider.   We have sent a referral to Pelvic Floor therapy and they will call you to make an appointment.  Continue omeprazole 20 mg daily.  The [facility] providers would like to encourage you to use [facility] to communicate with providers for non-urgent requests or questions.  Due to long hold times on the telephone, sending your provider a message by [facility] may be a faster and more efficient way to get a response.  Please allow 48 business hours for a response.  Please remember that this is for non-urgent requests.   It was a pleasure to see you today!  Thank you for trusting me with your gastrointestinal care!    Scott E.Candis Schatz, MD

## 2022-01-09 ENCOUNTER — Ambulatory Visit
Admission: RE | Admit: 2022-01-09 | Discharge: 2022-01-09 | Disposition: A | Discharge status: Home / Self Care | Payer: Medicare Other | Source: Ambulatory Visit | Attending: Internal Medicine | Admitting: Internal Medicine

## 2022-01-09 DIAGNOSIS — Z122 Encounter for screening for malignant neoplasm of respiratory organs: Secondary | ICD-10-CM

## 2022-01-10 ENCOUNTER — Telehealth: Payer: Self-pay | Admitting: Internal Medicine

## 2022-01-10 NOTE — Telephone Encounter (Signed)
Phone call placed to patient today to let her know that we received a report for CAT scan on the lungs that she had done yesterday.  It was sent to [place] in error.  Patient confirms that Dr. Chapman Fitch is now her PCP. Advised patient that I have sent a message through to the radiologist to let him know that he sent [place] the report and it needs to be rerouted to Dr. Chapman Fitch.  Advised patient that she should also let Dr. Chapman Fitch know so that she can go into the system and review the results. She expressed understanding.

## 2022-01-31 NOTE — Therapy (Deleted)
OUTPATIENT PHYSICAL THERAPY FEMALE PELVIC EVALUATION   Patient Name: Sarah [name] MRN: [id number] DOB:[birth year], 66 y.o., female Today's Date: 01/31/2022    Past Medical History:  Diagnosis Date   Acute pancreatitis    01-13-2015 per ED note   Adrenal adenoma    bilateral   Arthritis    Bilateral lower extremity edema    Complication of anesthesia    post-op 07-20-2014 surgery post-op Hypoxia due to acute CHF/ mild Pulmonary edema   COPD (chronic obstructive pulmonary disease) ([place])    Gastritis    01-13-2015  per ED note   GERD (gastroesophageal reflux disease)    Headache    History of acute congestive heart failure    07-22-2014   post-op surgery 07-20-2014   History of acute pancreatitis    01-04-2013    History of alcohol abuse    state quit 2002/  drank daily >fifth of gin   History of DVT of lower extremity    2000  post op left knee surgery   History of pulmonary edema    mild -  07-22-2014  post-op  surgery 07-20-2014   Hypertension    Type 2 diabetes mellitus (HCC)    Ulnar nerve compression    right elbow   Past Surgical History:  Procedure Laterality Date   BIOPSY  10/19/2021   Procedure: BIOPSY;  Surgeon: Daryel November, MD;  Location: [facility];  Service: Gastroenterology;;   COLONOSCOPY WITH PROPOFOL N/A 10/19/2021   Procedure: COLONOSCOPY WITH PROPOFOL;  Surgeon: Daryel November, MD;  Location: [facility];  Service: Gastroenterology;  Laterality: N/A;   DILATION AND CURETTAGE OF UTERUS     ESOPHAGOGASTRODUODENOSCOPY (EGD) WITH PROPOFOL N/A 10/19/2021   Procedure: ESOPHAGOGASTRODUODENOSCOPY (EGD) WITH PROPOFOL;  Surgeon: Daryel November, MD;  Location: [facility];  Service: Gastroenterology;  Laterality: N/A;   HEMOSTASIS CLIP PLACEMENT  10/19/2021   Procedure: HEMOSTASIS CLIP PLACEMENT;  Surgeon: Daryel November, MD;  Location: [facility];  Service: Gastroenterology;;   INSERTION OF MESH N/A 07/20/2014   Procedure: INSERTION OF  MESH;  Surgeon: Fanny Skates, MD;  Location: [facility];  Service: General;  Laterality: N/A;   KNEE ARTHROSCOPY Left 2000   POLYPECTOMY  10/19/2021   Procedure: POLYPECTOMY;  Surgeon: Daryel November, MD;  Location: [facility];  Service: Gastroenterology;;   TOTAL ABDOMINAL HYSTERECTOMY W/ BILATERAL SALPINGOOPHORECTOMY  1995   TRANSTHORACIC ECHOCARDIOGRAM  07-22-2014   mild LVH,  60-65%,  grade I diastolic dysfunction/  mild LAE   VENTRAL HERNIA REPAIR N/A 07/20/2014   Procedure: LAPAROSCOPIC REPAIR INCARCARATED VENTRAL HERNIA AND UMBILICAL HERNIA;  Surgeon: Fanny Skates, MD;  Location: [facility];  Service: General;  Laterality: N/A;   Patient Active Problem List   Diagnosis Date Noted   Pneumonia 11/22/2020   COPD with acute exacerbation ([place]) 11/22/2020   Sudden right hearing loss 03/20/2018   Dental infection 12/09/2017   Smokes 2 packs of cigarettes per day 12/09/2017   Adrenal adenoma 11/08/2017   Lipoma 01/10/2017   Gum disease 01/10/2017   DM neuropathy, type II diabetes mellitus ([place]) 12/12/2015   GERD (gastroesophageal reflux disease) 03/02/2015   Abdominal wall hernia 01/05/2013   Diabetes type 2, controlled ([place])    HTN (hypertension) 06/27/2011   TOBACCO ABUSE 02/05/2007   CONSTIPATION NOS 02/05/2007   OSTEOARTHROSIS, GENERALIZED, MULTIPLE SITES 02/05/2007   LOW BACK PAIN 02/05/2007    PCP: Antony Blackbird, MD  REFERRING PROVIDER: Daryel November, MD  REFERRING DIAG:  K21.9 (ICD-10-CM) -  Gastroesophageal reflux disease, unspecified whether esophagitis present  R15.9 (ICD-10-CM) - Incontinence of feces, unspecified fecal incontinence type  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  Rationale for Evaluation and Treatment Rehabilitation  ONSET DATE: ***  SUBJECTIVE:                                                                                                                                                                                            SUBJECTIVE STATEMENT: *** Fluid intake: {Yes/No:304960894}    PAIN:  Are you having pain? {yes/no:20286} NPRS scale: ***/10 Pain location: {pelvic pain location:27098}  Pain type: {type:313116} Pain description: {PAIN DESCRIPTION:21022940}   Aggravating factors: *** Relieving factors: ***  PRECAUTIONS: {Therapy precautions:24002}  WEIGHT BEARING RESTRICTIONS {Yes ***/No:24003}  FALLS:  Has patient fallen in last 6 months? {fallsyesno:27318}  LIVING ENVIRONMENT: Lives with: {[facility] lives with:25569::"lives with their family"} Lives in: {Lives in:25570} Stairs: {opstairs:27293} Has following equipment at home: {Assistive devices:23999}  OCCUPATION: ***  PLOF: {PLOF:24004}  PATIENT GOALS ***  PERTINENT HISTORY:  COPD; alcohol-induced pancreatitis; tortuous, looping colon,Type 2 diabetes mellitus ; History of DVT of lower extremity; TOTAL ABDOMINAL HYSTERECTOMY W/ BILATERAL SALPINGOOPHORECTOMY 1995; VENTRAL HERNIA REPAIR 07/20/2014 Sexual abuse: {Yes/No:304960894}  BOWEL MOVEMENT Pain with bowel movement: {yes/no:20286} Type of bowel movement:{PT BM type:27100} Fully empty rectum: {Yes/No:304960894} Leakage: seepage of stool with coughing, laughing and certain activities and movements Pads: {Yes/No:304960894} Fiber supplement: {Yes/No:304960894}  URINATION Pain with urination: {yes/no:20286} Fully empty bladder: {Yes/No:304960894} Stream: {PT urination:27102} Urgency: {Yes/No:304960894} Frequency: *** Leakage: {PT leakage:27103} Pads: {Yes/No:304960894}  INTERCOURSE Pain with intercourse: {pain with intercourse PA:27099} Ability to have vaginal penetration:  {Yes/No:304960894} Climax: *** Marinoff Scale: ***/3  PREGNANCY Vaginal deliveries *** Tearing {Yes***/No:304960894} C-section deliveries *** Currently pregnant {Yes***/No:304960894}  PROLAPSE {PT prolapse:27101}    OBJECTIVE:   DIAGNOSTIC FINDINGS:  The EGD  was notable for prominent mucosal folds in the duodenum with biopsies consistent with peptic duodenitis.    PATIENT SURVEYS:  {rehab surveys:24030}  PFIQ-7 ***  COGNITION:  Overall cognitive status: {cognition:24006}     SENSATION:  Light touch: {intact/deficits:24005}  Proprioception: {intact/deficits:24005}  MUSCLE LENGTH: Hamstrings: Right *** deg; Left *** deg Thomas test: Right *** deg; Left *** deg  LUMBAR SPECIAL TESTS:  {lumbar special test:25242}  FUNCTIONAL TESTS:  {Functional tests:24029}  GAIT: Distance walked: *** Assistive device utilized: {Assistive devices:23999} Level of assistance: {Levels of assistance:24026} Comments: ***               POSTURE: {posture:25561}   PELVIC ALIGNMENT:  LUMBARAROM/PROM  A/PROM A/PROM  eval  Flexion   Extension   Right  lateral flexion   Left lateral flexion   Right rotation   Left rotation    (Blank rows = not tested)  LOWER EXTREMITY ROM:  {AROM/PROM:27142} ROM Right eval Left eval  Hip flexion    Hip extension    Hip abduction    Hip adduction    Hip internal rotation    Hip external rotation    Knee flexion    Knee extension    Ankle dorsiflexion    Ankle plantarflexion    Ankle inversion    Ankle eversion     (Blank rows = not tested)  LOWER EXTREMITY MMT:  MMT Right eval Left eval  Hip flexion    Hip extension    Hip abduction    Hip adduction    Hip internal rotation    Hip external rotation    Knee flexion    Knee extension    Ankle dorsiflexion    Ankle plantarflexion    Ankle inversion    Ankle eversion      PALPATION:   General  ***                External Perineal Exam ***                             Internal Pelvic Floor ***  Patient confirms identification and approves PT to assess internal pelvic floor and treatment {yes/no:20286}  PELVIC MMT:   MMT eval  Vaginal   Internal Anal Sphincter   External Anal Sphincter   Puborectalis   Diastasis Recti   (Blank  rows = not tested)        TONE: ***  PROLAPSE: ***  TODAY'S TREATMENT  EVAL ***   PATIENT EDUCATION:  Education details: *** Person educated: {Person educated:25204} Education method: {Education Method:25205} Education comprehension: {Education Comprehension:25206}   HOME EXERCISE PROGRAM: ***  ASSESSMENT:  CLINICAL IMPRESSION: Patient is a *** y.o. *** who was seen today for physical therapy evaluation and treatment for ***.    OBJECTIVE IMPAIRMENTS {opptimpairments:25111}.   ACTIVITY LIMITATIONS {activitylimitations:27494}  PARTICIPATION LIMITATIONS: {participationrestrictions:25113}  PERSONAL FACTORS {Personal factors:25162} are also affecting patient's functional outcome.   REHAB POTENTIAL: {rehabpotential:25112}  CLINICAL DECISION MAKING: {clinical decision making:25114}  EVALUATION COMPLEXITY: {Evaluation complexity:25115}   GOALS: Goals reviewed with patient? {yes/no:20286}  SHORT TERM GOALS: Target date: {follow up:25551}  *** Baseline: Goal status: {GOALSTATUS:25110}  2.  *** Baseline:  Goal status: {GOALSTATUS:25110}  3.  *** Baseline:  Goal status: {GOALSTATUS:25110}  4.  *** Baseline:  Goal status: {GOALSTATUS:25110}  5.  *** Baseline:  Goal status: {GOALSTATUS:25110}  6.  *** Baseline:  Goal status: {GOALSTATUS:25110}  LONG TERM GOALS: Target date: {follow up:25551}   *** Baseline:  Goal status: {GOALSTATUS:25110}  2.  *** Baseline:  Goal status: {GOALSTATUS:25110}  3.  *** Baseline:  Goal status: {GOALSTATUS:25110}  4.  *** Baseline:  Goal status: {GOALSTATUS:25110}  5.  *** Baseline:  Goal status: {GOALSTATUS:25110}  6.  *** Baseline:  Goal status: {GOALSTATUS:25110}  PLAN: PT FREQUENCY: {rehab frequency:25116}  PT DURATION: {rehab duration:25117}  PLANNED INTERVENTIONS: {rehab planned interventions:25118::"Therapeutic exercises","Therapeutic activity","Neuromuscular re-education","Balance  training","Gait training","Patient/Family education","Self Care","Joint mobilization"}  PLAN FOR NEXT SESSION: ***   Takota Cahalan, PT 01/31/2022, 10:31 AM

## 2022-02-01 ENCOUNTER — Encounter: Payer: Medicare Other | Attending: Gastroenterology | Admitting: Physical Therapy

## 2022-02-08 ENCOUNTER — Encounter: Payer: Medicare Other | Admitting: Physical Therapy

## 2022-02-12 ENCOUNTER — Encounter: Payer: Self-pay | Admitting: Podiatry

## 2022-02-12 ENCOUNTER — Ambulatory Visit (INDEPENDENT_AMBULATORY_CARE_PROVIDER_SITE_OTHER): Payer: Medicare Other | Admitting: Podiatry

## 2022-02-12 DIAGNOSIS — E1140 Type 2 diabetes mellitus with diabetic neuropathy, unspecified: Secondary | ICD-10-CM

## 2022-02-12 DIAGNOSIS — M79675 Pain in left toe(s): Secondary | ICD-10-CM | POA: Diagnosis not present

## 2022-02-12 DIAGNOSIS — M79674 Pain in right toe(s): Secondary | ICD-10-CM | POA: Diagnosis not present

## 2022-02-12 DIAGNOSIS — B351 Tinea unguium: Secondary | ICD-10-CM | POA: Diagnosis not present

## 2022-02-12 NOTE — Progress Notes (Signed)
This patient returns to my office for at risk foot care.  This patient requires this care by a professional since this patient will be at risk due to having diabetic neuropathy  This patient is unable to cut nails himself since the patient cannot reach his nails.These nails are painful walking and wearing shoes.  This patient presents for at risk foot care today.  General Appearance  Alert, conversant and in no acute stress.  Vascular  Dorsalis pedis and posterior tibial  pulses are palpable  bilaterally.  Capillary return is within normal limits  bilaterally. Temperature is within normal limits  bilaterally.  Neurologic  Senn-Weinstein monofilament wire test within normal limits  bilaterally. Muscle power within normal limits bilaterally.  Nails Thick disfigured discolored nails with subungual debris  from hallux to fifth toes bilaterally. No evidence of bacterial infection or drainage bilaterally.  Orthopedic  No limitations of motion  feet .  No crepitus or effusions noted.  No bony pathology or digital deformities noted.  Skin  normotropic skin with no porokeratosis noted bilaterally.  No signs of infections or ulcers noted.     Onychomycosis  Pain in right toes  Pain in left toes  Consent was obtained for treatment procedures.   Mechanical debridement of nails 1-5  bilaterally performed with a nail nipper.  Filed with dremel without incident.    Return office visit   10 weeks                   Told patient to return for periodic foot care and evaluation due to potential at risk complications.   Brandace Cargle DPM   

## 2022-02-15 ENCOUNTER — Encounter: Payer: Medicare Other | Admitting: Physical Therapy

## 2022-02-22 ENCOUNTER — Encounter: Payer: Medicare Other | Admitting: Physical Therapy

## 2022-02-28 ENCOUNTER — Other Ambulatory Visit: Payer: Self-pay | Admitting: Infectious Diseases

## 2022-02-28 DIAGNOSIS — Z1231 Encounter for screening mammogram for malignant neoplasm of breast: Secondary | ICD-10-CM

## 2022-03-05 ENCOUNTER — Ambulatory Visit: Payer: Medicare Other

## 2022-03-16 ENCOUNTER — Ambulatory Visit
Admission: RE | Admit: 2022-03-16 | Discharge: 2022-03-16 | Disposition: A | Discharge status: Home / Self Care | Payer: Medicare Other | Source: Ambulatory Visit | Attending: Infectious Diseases | Admitting: Infectious Diseases

## 2022-03-16 DIAGNOSIS — Z1231 Encounter for screening mammogram for malignant neoplasm of breast: Secondary | ICD-10-CM

## 2022-03-19 NOTE — Therapy (Signed)
OUTPATIENT PHYSICAL THERAPY FEMALE PELVIC EVALUATION   Patient Name: Sarah [name] MRN: [id number] DOB:[birth year], 66 y.o., female Today's Date: 03/19/2022    Past Medical History:  Diagnosis Date   Acute pancreatitis    01-13-2015 per ED note   Adrenal adenoma    bilateral   Arthritis    Bilateral lower extremity edema    Complication of anesthesia    post-op 07-20-2014 surgery post-op Hypoxia due to acute CHF/ mild Pulmonary edema   COPD (chronic obstructive pulmonary disease) ([place])    Gastritis    01-13-2015  per ED note   GERD (gastroesophageal reflux disease)    Headache    History of acute congestive heart failure    07-22-2014   post-op surgery 07-20-2014   History of acute pancreatitis    01-04-2013    History of alcohol abuse    state quit 2002/  drank daily >fifth of gin   History of DVT of lower extremity    2000  post op left knee surgery   History of pulmonary edema    mild -  07-22-2014  post-op  surgery 07-20-2014   Hypertension    Type 2 diabetes mellitus (HCC)    Ulnar nerve compression    right elbow   Past Surgical History:  Procedure Laterality Date   BIOPSY  10/19/2021   Procedure: BIOPSY;  Surgeon: Daryel November, MD;  Location: [facility];  Service: Gastroenterology;;   COLONOSCOPY WITH PROPOFOL N/A 10/19/2021   Procedure: COLONOSCOPY WITH PROPOFOL;  Surgeon: Daryel November, MD;  Location: [facility];  Service: Gastroenterology;  Laterality: N/A;   DILATION AND CURETTAGE OF UTERUS     ESOPHAGOGASTRODUODENOSCOPY (EGD) WITH PROPOFOL N/A 10/19/2021   Procedure: ESOPHAGOGASTRODUODENOSCOPY (EGD) WITH PROPOFOL;  Surgeon: Daryel November, MD;  Location: [facility];  Service: Gastroenterology;  Laterality: N/A;   HEMOSTASIS CLIP PLACEMENT  10/19/2021   Procedure: HEMOSTASIS CLIP PLACEMENT;  Surgeon: Daryel November, MD;  Location: [facility];  Service: Gastroenterology;;   INSERTION OF MESH N/A 07/20/2014   Procedure: INSERTION OF  MESH;  Surgeon: Fanny Skates, MD;  Location: [facility];  Service: General;  Laterality: N/A;   KNEE ARTHROSCOPY Left 2000   POLYPECTOMY  10/19/2021   Procedure: POLYPECTOMY;  Surgeon: Daryel November, MD;  Location: [facility];  Service: Gastroenterology;;   TOTAL ABDOMINAL HYSTERECTOMY W/ BILATERAL SALPINGOOPHORECTOMY  1995   TRANSTHORACIC ECHOCARDIOGRAM  07-22-2014   mild LVH,  60-65%,  grade I diastolic dysfunction/  mild LAE   VENTRAL HERNIA REPAIR N/A 07/20/2014   Procedure: LAPAROSCOPIC REPAIR INCARCARATED VENTRAL HERNIA AND UMBILICAL HERNIA;  Surgeon: Fanny Skates, MD;  Location: [facility];  Service: General;  Laterality: N/A;   Patient Active Problem List   Diagnosis Date Noted   Pain due to onychomycosis of toenails of both feet 02/12/2022   Pneumonia 11/22/2020   COPD with acute exacerbation ([place]) 11/22/2020   Sudden right hearing loss 03/20/2018   Dental infection 12/09/2017   Smokes 2 packs of cigarettes per day 12/09/2017   Adrenal adenoma 11/08/2017   Lipoma 01/10/2017   Gum disease 01/10/2017   DM neuropathy, type II diabetes mellitus ([place]) 12/12/2015   GERD (gastroesophageal reflux disease) 03/02/2015   Abdominal wall hernia 01/05/2013   Diabetes type 2, controlled ([place])    HTN (hypertension) 06/27/2011   TOBACCO ABUSE 02/05/2007   CONSTIPATION NOS 02/05/2007   OSTEOARTHROSIS, GENERALIZED, MULTIPLE SITES 02/05/2007   LOW BACK PAIN 02/05/2007    PCP: Macy Mis, MD  REFERRING PROVIDER: Daryel November, MD  REFERRING DIAG:  K21.9 (ICD-10-CM) - Gastroesophageal reflux disease, unspecified whether esophagitis present  R15.9 (ICD-10-CM) - Incontinence of feces, unspecified fecal incontinence type  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  Rationale for Evaluation and Treatment Rehabilitation  ONSET DATE: 2021  SUBJECTIVE:                                                                                                                                                                                            SUBJECTIVE STATEMENT: Stool leakage. Patient reports     PAIN:  Are you having pain? No  PRECAUTIONS: None  WEIGHT BEARING RESTRICTIONS No  FALLS:  Has patient fallen in last 6 months? No  PERTINENT HISTORY:  Congestive heart failure; diabetes; total abdominal hysterectomy; ventral hernia repair;  BOWEL MOVEMENT Pain with bowel movement: No Type of bowel movement:Type (Bristol Stool Scale) type 4 and daily Fully empty rectum: Yes:   Leakage: Yes: with coughing, laughing , startled,  Pads: Yes: daily change a night and morning Fiber supplement: Yes: metamucil when the stool is runny    OBJECTIVE:   DIAGNOSTIC FINDINGS:  colonoscopy was technically difficult due to a tortuous, looping colon, as well as poor visualization secondary to poor bowel prep and persistent coughing and diaphragmatic breathing   COGNITION:  Overall cognitive status: Within functional limits for tasks assessed                    ASSESSMENT:  CLINICAL IMPRESSION: Patient is a 66 y.o. female who was seen today for physical therapy evaluation and treatment for fecal incontinence. Patient reports she has not had fecal leakage for several months. She will only have it when she takes her poop pills. Patient feels she does not need therapy at this time. Patient and therapist agreed she does not need therapy at this time but if the symptoms come back she can get a new referral.        PLAN:  PLAN FOR NEXT SESSION: Patient will not have therapy at this time. She can return if her issues come back.    Earlie Counts, PT 03/20/22 2:57 PM

## 2022-03-20 ENCOUNTER — Encounter: Payer: Self-pay | Admitting: Physical Therapy

## 2022-03-20 ENCOUNTER — Encounter: Payer: Medicare Other | Attending: Gastroenterology | Admitting: Physical Therapy

## 2022-03-20 ENCOUNTER — Other Ambulatory Visit: Payer: Self-pay

## 2022-03-20 DIAGNOSIS — R278 Other lack of coordination: Secondary | ICD-10-CM | POA: Insufficient documentation

## 2022-03-29 ENCOUNTER — Encounter: Payer: Medicare Other | Admitting: Physical Therapy

## 2022-04-05 ENCOUNTER — Encounter: Payer: Medicare Other | Admitting: Physical Therapy

## 2022-04-24 ENCOUNTER — Encounter (HOSPITAL_BASED_OUTPATIENT_CLINIC_OR_DEPARTMENT_OTHER): Payer: Self-pay

## 2022-04-24 DIAGNOSIS — G4700 Insomnia, unspecified: Secondary | ICD-10-CM

## 2022-04-24 DIAGNOSIS — R5383 Other fatigue: Secondary | ICD-10-CM

## 2022-05-15 ENCOUNTER — Ambulatory Visit (INDEPENDENT_AMBULATORY_CARE_PROVIDER_SITE_OTHER): Payer: Medicare Other | Admitting: Podiatry

## 2022-05-15 ENCOUNTER — Encounter: Payer: Self-pay | Admitting: Podiatry

## 2022-05-15 DIAGNOSIS — E1140 Type 2 diabetes mellitus with diabetic neuropathy, unspecified: Secondary | ICD-10-CM | POA: Diagnosis not present

## 2022-05-15 DIAGNOSIS — M79675 Pain in left toe(s): Secondary | ICD-10-CM

## 2022-05-15 DIAGNOSIS — L84 Corns and callosities: Secondary | ICD-10-CM

## 2022-05-15 DIAGNOSIS — M79674 Pain in right toe(s): Secondary | ICD-10-CM

## 2022-05-15 DIAGNOSIS — B351 Tinea unguium: Secondary | ICD-10-CM

## 2022-05-15 NOTE — Progress Notes (Signed)
This patient returns to my office for at risk foot care.  This patient requires this care by a professional since this patient will be at risk due to having diabetic neuropathy.  This patient is unable to cut nails himself since the patient cannot reach his nails.These nails are painful walking and wearing shoes.   Patient has painful callus on the outside left heel. This patient presents for at risk foot care today.  General Appearance  Alert, conversant and in no acute stress.  Vascular  Dorsalis pedis and posterior tibial  pulses are palpable  bilaterally.  Capillary return is within normal limits  bilaterally. Temperature is within normal limits  bilaterally.  Neurologic  Senn-Weinstein monofilament wire test within normal limits  bilaterally. Muscle power within normal limits bilaterally.  Nails Thick disfigured discolored nails with subungual debris  from hallux to fifth toes bilaterally. No evidence of bacterial infection or drainage bilaterally.  Orthopedic  No limitations of motion  feet .  No crepitus or effusions noted.  No bony pathology or digital deformities noted.  Skin  normotropic skin noted bilaterally.  No signs of infections or ulcers noted.   Porokeratosis left heel.  Onychomycosis  Pain in right toes  Pain in left toes  Porokeratosis left heel.  Consent was obtained for treatment procedures.   Mechanical debridement of nails 1-5  bilaterally performed with a nail nipper.  Filed with dremel without incident.    Return office visit     10 weeks                 Told patient to return for periodic foot care and evaluation due to potential at risk complications.   Gardiner Barefoot DPM

## 2022-05-22 ENCOUNTER — Encounter (HOSPITAL_BASED_OUTPATIENT_CLINIC_OR_DEPARTMENT_OTHER): Payer: Medicare Other | Admitting: Internal Medicine

## 2022-07-05 ENCOUNTER — Telehealth: Payer: Self-pay

## 2022-07-05 ENCOUNTER — Other Ambulatory Visit: Payer: Self-pay

## 2022-07-05 DIAGNOSIS — D126 Benign neoplasm of colon, unspecified: Secondary | ICD-10-CM

## 2022-07-05 DIAGNOSIS — Z86010 Personal history of colonic polyps: Secondary | ICD-10-CM

## 2022-07-05 NOTE — Telephone Encounter (Signed)
Pt scheduled for previsit 07/16/22 at 2pm. Colon scheduled at [facility] 07/26/22 at 8:30am, pt to arrive there at 7am. Case #[id number]. Pt aware of appts.

## 2022-07-16 ENCOUNTER — Telehealth: Payer: Self-pay

## 2022-07-16 ENCOUNTER — Ambulatory Visit (AMBULATORY_SURGERY_CENTER): Payer: 59

## 2022-07-16 VITALS — Ht 68.0 in | Wt 213.0 lb

## 2022-07-16 DIAGNOSIS — Z86010 Personal history of colonic polyps: Secondary | ICD-10-CM

## 2022-07-16 DIAGNOSIS — D126 Benign neoplasm of colon, unspecified: Secondary | ICD-10-CM

## 2022-07-16 MED ORDER — NA SULFATE-K SULFATE-MG SULF 17.5-3.13-1.6 GM/177ML PO SOLN: 1.0000 | Freq: Once | ORAL | 0 refills | Status: AC

## 2022-07-16 NOTE — Telephone Encounter (Signed)
Just finished this patients PV. Instructed her on 2 day prep. Patient states she is unable to tolerate dulcolax laxative. She does currently take lubiprostone which she states has been helping her to have consistent bowel movement. She takes this daily instead of two times a day as prescribe due to frequency and urgency that comes as a side effect. Please advise on alternate or if it would be ok for her you use what she has in place of dulcolax tablets.   Thank you

## 2022-07-16 NOTE — Progress Notes (Signed)
No egg or soy allergy known to patient  Patient states she had complications post op after her procedure in 2016. Noted in chart to hypoxia due to CHF/mild pulmonary edema.  Patient denies ever being told they had issues or difficulty with intubation  No FH of Malignant Hyperthermia Pt is not on diet pills Pt is not on  home 02  Pt is not on blood thinners  Pt denies issues with constipation  No A fib or A flutter Have any cardiac testing pending-- NO  Pt instructed to use Single[employer].com or GoodRx for a price reduction on prep

## 2022-07-16 NOTE — Telephone Encounter (Signed)
Made in error

## 2022-07-17 NOTE — Telephone Encounter (Signed)
Dr. Candis Schatz,  After reviewing the prep instructions, the patient was given the 2 day prep with Suprep and Miralax.  Sarah [name]

## 2022-07-18 NOTE — Telephone Encounter (Signed)
Spoke with patient regarding lubiprostrone.  Let her know that Dr. Candis Schatz had approved her using the lubiprostone instead of Dulcolax.  Patient verbalized understanding.  See notes on 1/30 from Dr. Candis Schatz.

## 2022-07-24 ENCOUNTER — Ambulatory Visit (INDEPENDENT_AMBULATORY_CARE_PROVIDER_SITE_OTHER): Payer: 59 | Admitting: Podiatry

## 2022-07-24 ENCOUNTER — Encounter: Payer: Self-pay | Admitting: Podiatry

## 2022-07-24 VITALS — BP 140/64

## 2022-07-24 DIAGNOSIS — B351 Tinea unguium: Secondary | ICD-10-CM | POA: Diagnosis not present

## 2022-07-24 DIAGNOSIS — M79674 Pain in right toe(s): Secondary | ICD-10-CM | POA: Diagnosis not present

## 2022-07-24 DIAGNOSIS — M79675 Pain in left toe(s): Secondary | ICD-10-CM | POA: Diagnosis not present

## 2022-07-24 DIAGNOSIS — E1140 Type 2 diabetes mellitus with diabetic neuropathy, unspecified: Secondary | ICD-10-CM

## 2022-07-24 NOTE — Progress Notes (Signed)
This patient returns to my office for at risk foot care.  This patient requires this care by a professional since this patient will be at risk due to having diabetic neuropathy.  This patient is unable to cut nails himself since the patient cannot reach his nails.These nails are painful walking and wearing shoes.   Patient has painful callus on the outside left heel. This patient presents for at risk foot care today.  General Appearance  Alert, conversant and in no acute stress.  Vascular  Dorsalis pedis and posterior tibial  pulses are palpable  bilaterally.  Capillary return is within normal limits  bilaterally. Temperature is within normal limits  bilaterally.  Neurologic  Senn-Weinstein monofilament wire test within normal limits  bilaterally. Muscle power within normal limits bilaterally.  Nails Thick disfigured discolored nails with subungual debris  from hallux to fifth toes bilaterally. No evidence of bacterial infection or drainage bilaterally.  Orthopedic  No limitations of motion  feet .  No crepitus or effusions noted.  No bony pathology or digital deformities noted.  Skin  normotropic skin noted bilaterally.  No signs of infections or ulcers noted.   Porokeratosis left heel.  Onychomycosis  Pain in right toes  Pain in left toes  Porokeratosis left heel.  Consent was obtained for treatment procedures.   Mechanical debridement of nails 1-5  bilaterally performed with a nail nipper.  Filed with dremel without incident.    Return office visit     9 weeks                 Told patient to return for periodic foot care and evaluation due to potential at risk complications.   Gardiner Barefoot DPM

## 2022-07-25 ENCOUNTER — Telehealth: Payer: Self-pay | Admitting: Gastroenterology

## 2022-07-25 NOTE — Telephone Encounter (Signed)
Danae Chen called from [employer] center and pt wanted procedure scheduled at hospital cancelled for tomorrow. Pt states she does not have the money to pay for the procedure. Discussed with Danae Chen that the pt can have the hospital bill her and set up payments. She states she will speak with the patient and try to get her to reschedule the appt. Dr. Candis Schatz notified.

## 2022-07-25 NOTE — Telephone Encounter (Signed)
Danae Chen is the coordinator from the [employer] is calling to cancel patient procedure for tomorrow at the [facility] ..Please call her at [phone]

## 2022-07-26 ENCOUNTER — Encounter (HOSPITAL_COMMUNITY): Admission: RE | Payer: Self-pay | Source: Home / Self Care

## 2022-07-26 ENCOUNTER — Other Ambulatory Visit: Payer: Self-pay | Admitting: Gastroenterology

## 2022-07-26 ENCOUNTER — Ambulatory Visit (HOSPITAL_COMMUNITY): Admission: RE | Admit: 2022-07-26 | Payer: 59 | Source: Home / Self Care | Admitting: Gastroenterology

## 2022-07-26 DIAGNOSIS — Z86010 Personal history of colonic polyps: Secondary | ICD-10-CM

## 2022-07-26 DIAGNOSIS — D126 Benign neoplasm of colon, unspecified: Secondary | ICD-10-CM

## 2022-07-26 SURGERY — COLONOSCOPY WITH PROPOFOL
Anesthesia: Monitor Anesthesia Care

## 2022-10-23 ENCOUNTER — Ambulatory Visit
Admission: RE | Admit: 2022-10-23 | Discharge: 2022-10-23 | Disposition: A | Discharge status: Home / Self Care | Payer: 59 | Source: Ambulatory Visit | Attending: Family Medicine | Admitting: Family Medicine

## 2022-10-23 ENCOUNTER — Other Ambulatory Visit: Payer: Self-pay | Admitting: Family Medicine

## 2022-10-23 DIAGNOSIS — G8929 Other chronic pain: Secondary | ICD-10-CM

## 2022-11-06 ENCOUNTER — Encounter: Payer: Self-pay | Admitting: Podiatry

## 2022-11-06 ENCOUNTER — Ambulatory Visit (INDEPENDENT_AMBULATORY_CARE_PROVIDER_SITE_OTHER): Payer: 59 | Admitting: Podiatry

## 2022-11-06 DIAGNOSIS — M79675 Pain in left toe(s): Secondary | ICD-10-CM

## 2022-11-06 DIAGNOSIS — E1140 Type 2 diabetes mellitus with diabetic neuropathy, unspecified: Secondary | ICD-10-CM | POA: Diagnosis not present

## 2022-11-06 DIAGNOSIS — M79674 Pain in right toe(s): Secondary | ICD-10-CM | POA: Diagnosis not present

## 2022-11-06 DIAGNOSIS — L84 Corns and callosities: Secondary | ICD-10-CM | POA: Diagnosis not present

## 2022-11-06 DIAGNOSIS — B351 Tinea unguium: Secondary | ICD-10-CM

## 2022-11-06 NOTE — Progress Notes (Signed)
  Subjective:  Patient ID: Sarah [name], female    DOB: 1956/03/29,  MRN: [id number]  Sarah [name] presents to clinic today for at risk foot care with history of diabetic neuropathy and callus(es) left heel and painful thick toenails that are difficult to trim. Painful toenails interfere with ambulation. Aggravating factors include wearing enclosed shoe gear. Pain is relieved with periodic professional debridement. Painful calluses are aggravated when weightbearing with and without shoegear. Pain is relieved with periodic professional debridement.  Chief Complaint  Patient presents with   Nail Problem    [facility] BS-did not check today A1C-8.2 PCP-[place] PCP VST- 2 weeks ago    New problem(s): None.   PCP is Lourena Simmonds, Donald Pore, MD.  Allergies  Allergen Reactions   Aspirin Other (See Comments)    Causes her stomach pain.   Ibuprofen Other (See Comments)    Causes stomach pain   Other    Penicillins Other (See Comments)    Localized whelps and redness after injection Has patient had a PCN reaction causing immediate rash, facial/tongue/throat swelling, SOB or lightheadedness with hypotension: No Has patient had a PCN reaction causing severe rash involving mucus membranes or skin necrosis: Yes Has patient had a PCN reaction that required hospitalization No Has patient had a PCN reaction occurring within the last 10 years: No If all of the above answers are "NO", then may proceed with Cephalosporin use.     Review of Systems: Negative except as noted in the HPI.  Objective: No changes noted in today's physical examination. There were no vitals filed for this visit. Sarah [name] is a pleasant 67 y.o. female in NAD. AAO x 3.  Vascular Examination: Capillary refill time immediate b/l. Vascular status intact b/l with palpable pedal pulses. Pedal hair present b/l. No pain with calf compression b/l. Skin temperature gradient WNL b/l. No cyanosis or clubbing b/l. No ischemia or  gangrene noted b/l.   Neurological Examination: Sensation grossly intact b/l with 10 gram monofilament. Vibratory sensation intact b/l. Pt has subjective symptoms of neuropathy.  Dermatological Examination: Pedal skin with normal turgor, texture and tone b/l.  No open wounds. No interdigital macerations.   Toenails 1-5 b/l thick, discolored, elongated with subungual debris and pain on dorsal palpation.   Porokeratotic lesion(s) left heel. No erythema, no edema, no drainage, no fluctuance.  Musculoskeletal Examination: Normal muscle strength 5/5 to all lower extremity muscle groups bilaterally. No pain, crepitus or joint limitation noted with ROM b/l LE. No gross bony pedal deformities b/l. Patient ambulates independently without assistive aids.  Radiographs: None  Assessment/Plan: 1. Pain due to onychomycosis of toenails of both feet   2. Callus   3. Type 2 diabetes mellitus with diabetic neuropathy, unspecified whether long term insulin use (HCC)     -Patient was evaluated and treated. All patient's and/or POA's questions/concerns answered on today's visit. -Patient to continue soft, supportive shoe gear daily. -Toenails 1-5 b/l were debrided in length and girth with sterile nail nippers and dremel without iatrogenic bleeding.  -Callus(es) left heel pared utilizing rotary bur without complication or incident. Total number pared =1. -Patient/POA to call should there be question/concern in the interim.   Return in about 9 weeks (around 01/08/2023).  Freddie Breech, DPM

## 2023-01-04 ENCOUNTER — Ambulatory Visit (INDEPENDENT_AMBULATORY_CARE_PROVIDER_SITE_OTHER): Payer: 59 | Admitting: Podiatry

## 2023-01-04 DIAGNOSIS — B351 Tinea unguium: Secondary | ICD-10-CM

## 2023-01-04 DIAGNOSIS — M79675 Pain in left toe(s): Secondary | ICD-10-CM

## 2023-01-04 DIAGNOSIS — M19071 Primary osteoarthritis, right ankle and foot: Secondary | ICD-10-CM

## 2023-01-04 DIAGNOSIS — M79674 Pain in right toe(s): Secondary | ICD-10-CM | POA: Diagnosis not present

## 2023-01-04 DIAGNOSIS — E1140 Type 2 diabetes mellitus with diabetic neuropathy, unspecified: Secondary | ICD-10-CM | POA: Diagnosis not present

## 2023-01-04 NOTE — Progress Notes (Signed)
  Subjective:  Patient ID: Sarah [name], female    DOB: 05-11-1956,  MRN: [id number]  Chief Complaint  Patient presents with   Nail Problem    Patient stumped her left hallux nail and her nail broke at the tip of the nail. Patient c/o pain.    Foot Pain    Patient is requesting steroid injection to the top of right foot. She has had steroid injection in the past.     67 y.o. female presents with the above complaint. History confirmed with patient. Patient presenting with pain related to dystrophic thickened elongated nails. Patient is unable to trim own nails related to nail dystrophy and/or mobility issues. Patient does  have a history of T2DM. Patient does report pain in the right midfoot also, has history of arthritis and has had steroid shot in the past, requesting shot today as she is having flare up.  Objective:  Physical Exam: warm, good capillary refill nail exam onychomycosis of the toenails DP pulses palpable, PT pulses palpable, and protective sensation intact Left Foot:  Pain with palpation of nails due to elongation and dystrophic growth.  Right Foot: Pain with palpation of nails due to elongation and dystrophic growth. Pain in right dorsal midfoot joints, palpable osseous prominence is noted.   Assessment:   1. Pain due to onychomycosis of toenails of both feet   2. Arthritis of right midfoot   3. Type 2 diabetes mellitus with diabetic neuropathy, unspecified whether long term insulin use (HCC)      Plan:  Patient was evaluated and treated and all questions answered.  #Right midfoot arhthritis -Recommend steroid injection -After sterile prep injected 1 cc 0.5%marcaine plain with 1 cc of kenalog 10 along the painful area on the right midfoot joints - Will continue to monitor and perform further steroid injection as needed  #Onychomycosis with pain  -Nails palliatively debrided as below. -Educated on self-care  Procedure: Nail Debridement Rationale: Pain Type of  Debridement: manual, sharp debridement. Instrumentation: Nail nipper, rotary burr. Number of Nails: 10          Corinna Gab, DPM [facility] / [facility]

## 2023-01-14 ENCOUNTER — Ambulatory Visit: Payer: 59 | Admitting: Podiatry

## 2023-03-19 ENCOUNTER — Ambulatory Visit (INDEPENDENT_AMBULATORY_CARE_PROVIDER_SITE_OTHER): Payer: 59 | Admitting: Podiatry

## 2023-03-19 DIAGNOSIS — Z91199 Patient's noncompliance with other medical treatment and regimen due to unspecified reason: Secondary | ICD-10-CM

## 2023-03-19 NOTE — Progress Notes (Signed)
1. No-show for appointment     

## 2023-05-21 ENCOUNTER — Ambulatory Visit (INDEPENDENT_AMBULATORY_CARE_PROVIDER_SITE_OTHER): Payer: 59 | Admitting: Podiatry

## 2023-05-21 DIAGNOSIS — M79675 Pain in left toe(s): Secondary | ICD-10-CM | POA: Diagnosis not present

## 2023-05-21 DIAGNOSIS — M79674 Pain in right toe(s): Secondary | ICD-10-CM | POA: Diagnosis not present

## 2023-05-21 DIAGNOSIS — B351 Tinea unguium: Secondary | ICD-10-CM

## 2023-05-21 DIAGNOSIS — E1140 Type 2 diabetes mellitus with diabetic neuropathy, unspecified: Secondary | ICD-10-CM

## 2023-05-21 NOTE — Progress Notes (Signed)
  Subjective:  Patient ID: Sarah [name], female    DOB: 1955/12/16,  MRN: [id number]  Chief Complaint  Patient presents with   Diabetes    Patient is here for routine foot care/ nail trim    67 y.o. female presents with the above complaint. History confirmed with patient. Patient presenting with pain related to dystrophic thickened elongated nails. Patient is unable to trim own nails related to nail dystrophy and/or mobility issues. Patient does  have a history of T2DM.   Objective:  Physical Exam: warm, good capillary refill nail exam onychomycosis of the toenails DP pulses palpable, PT pulses palpable, and protective sensation intact Left Foot:  Pain with palpation of nails due to elongation and dystrophic growth.  Right Foot: Pain with palpation of nails due to elongation and dystrophic growth.   Assessment:   1. Pain due to onychomycosis of toenails of both feet   2. Type 2 diabetes mellitus with diabetic neuropathy, unspecified whether long term insulin use (HCC)       Plan:  Patient was evaluated and treated and all questions answered.  #Right midfoot arhthritis - improved from prior  #Onychomycosis with pain  -Nails palliatively debrided as below. -Educated on self-care  Procedure: Nail Debridement Rationale: Pain Type of Debridement: manual, sharp debridement. Instrumentation: Nail nipper, rotary burr. Number of Nails: 10          Corinna Gab, DPM [facility] / [facility]

## 2023-08-22 ENCOUNTER — Encounter: Payer: Self-pay | Admitting: Podiatry

## 2023-08-22 ENCOUNTER — Ambulatory Visit (INDEPENDENT_AMBULATORY_CARE_PROVIDER_SITE_OTHER): Payer: 59 | Admitting: Podiatry

## 2023-08-22 DIAGNOSIS — M79675 Pain in left toe(s): Secondary | ICD-10-CM | POA: Diagnosis not present

## 2023-08-22 DIAGNOSIS — M79674 Pain in right toe(s): Secondary | ICD-10-CM

## 2023-08-22 DIAGNOSIS — E1140 Type 2 diabetes mellitus with diabetic neuropathy, unspecified: Secondary | ICD-10-CM

## 2023-08-22 DIAGNOSIS — B351 Tinea unguium: Secondary | ICD-10-CM

## 2023-08-22 DIAGNOSIS — M19071 Primary osteoarthritis, right ankle and foot: Secondary | ICD-10-CM

## 2023-08-22 DIAGNOSIS — M19072 Primary osteoarthritis, left ankle and foot: Secondary | ICD-10-CM

## 2023-08-22 MED ORDER — TRIAMCINOLONE ACETONIDE 10 MG/ML IJ SUSP
10.0000 mg | Freq: Once | INTRAMUSCULAR | Status: AC
Start: 2023-08-22 — End: 2023-08-22
  Administered 2023-08-22: 10 mg

## 2023-08-22 NOTE — Progress Notes (Signed)
  Subjective:  Patient ID: Sarah [name], female    DOB: 01-18-1956,  MRN: [id number]  Chief Complaint  Patient presents with   Diabetes    Patient is here for her [facility] and also to get her injection    68 y.o. female presents with the above complaint. History confirmed with patient. Patient presenting with pain related to dystrophic thickened elongated nails. Patient is unable to trim own nails related to nail dystrophy and/or mobility issues. Patient does  have a history of T2DM. Patient does report pain in the right  and left midfoot also, has history of arthritis and has had steroid shot in the past, requesting shot today as she is having flare up. Left foot is a newer problem for her.  Objective:  Physical Exam: warm, good capillary refill nail exam onychomycosis of the toenails DP pulses faintly palpable, PT pulses faintly palpable, and protective sensation intact Left Foot:  Pain with palpation of nails due to elongation and dystrophic growth. Pain in right dorsal midfoot joints, palpable osseous prominence is noted.  Right Foot: Pain with palpation of nails due to elongation and dystrophic growth. Pain in right dorsal midfoot joints, palpable osseous prominence is noted.   Assessment:   1. Type 2 diabetes mellitus with diabetic neuropathy, unspecified whether long term insulin use (HCC)   2. Pain due to onychomycosis of toenails of both feet   3. Arthritis of both midfeet      Plan:  Patient was evaluated and treated and all questions answered.  #Right  and Left midfoot arhthritis -Recommend steroid injection -After sterile prep injected 0.5 cc 0.5%marcaine plain, 0.5 cc with 1% lidocaine plain with 0.5 cc of kenalog 10 along the painful area on the left and right foot painful midfoot joints. - Will continue to monitor and perform further steroid injection as needed  #Onychomycosis with pain  -Nails palliatively debrided as below. -Educated on self-care  Procedure: Nail  Debridement Rationale: Pain Type of Debridement: manual, sharp debridement. Instrumentation: Nail nipper, rotary burr. Number of Nails: 10   Follow-up in 3 months for diabetic footcare        Arleigh Dicola L. Jamse Arn, DPM [facility] / [facility]

## 2023-10-10 ENCOUNTER — Other Ambulatory Visit: Payer: Self-pay | Admitting: Family Medicine

## 2023-10-10 DIAGNOSIS — Z0000 Encounter for general adult medical examination without abnormal findings: Secondary | ICD-10-CM

## 2023-10-10 DIAGNOSIS — R5381 Other malaise: Secondary | ICD-10-CM

## 2023-10-17 ENCOUNTER — Encounter: Payer: Self-pay | Admitting: Family Medicine

## 2023-10-21 ENCOUNTER — Other Ambulatory Visit: Payer: Self-pay | Admitting: Family Medicine

## 2023-10-21 DIAGNOSIS — Z1211 Encounter for screening for malignant neoplasm of colon: Secondary | ICD-10-CM

## 2023-11-14 ENCOUNTER — Ambulatory Visit

## 2023-11-21 ENCOUNTER — Ambulatory Visit (HOSPITAL_COMMUNITY): Admission: EM | Admit: 2023-11-21 | Discharge: 2023-11-21 | Disposition: A | Discharge status: Home / Self Care

## 2023-11-21 ENCOUNTER — Inpatient Hospital Stay (HOSPITAL_COMMUNITY)
Admission: EM | Admit: 2023-11-21 | Discharge: 2023-11-25 | DRG: 440 | Disposition: A | Discharge status: Home / Self Care | Attending: Internal Medicine | Admitting: Internal Medicine

## 2023-11-21 ENCOUNTER — Encounter (HOSPITAL_COMMUNITY): Payer: Self-pay | Admitting: *Deleted

## 2023-11-21 ENCOUNTER — Emergency Department (HOSPITAL_COMMUNITY)

## 2023-11-21 ENCOUNTER — Other Ambulatory Visit: Payer: Self-pay

## 2023-11-21 DIAGNOSIS — Z7984 Long term (current) use of oral hypoglycemic drugs: Secondary | ICD-10-CM

## 2023-11-21 DIAGNOSIS — K853 Drug induced acute pancreatitis without necrosis or infection: Principal | ICD-10-CM | POA: Diagnosis present

## 2023-11-21 DIAGNOSIS — R109 Unspecified abdominal pain: Secondary | ICD-10-CM | POA: Diagnosis not present

## 2023-11-21 DIAGNOSIS — Z800 Family history of malignant neoplasm of digestive organs: Secondary | ICD-10-CM

## 2023-11-21 DIAGNOSIS — Z86718 Personal history of other venous thrombosis and embolism: Secondary | ICD-10-CM

## 2023-11-21 DIAGNOSIS — Z7982 Long term (current) use of aspirin: Secondary | ICD-10-CM

## 2023-11-21 DIAGNOSIS — E1142 Type 2 diabetes mellitus with diabetic polyneuropathy: Secondary | ICD-10-CM | POA: Diagnosis present

## 2023-11-21 DIAGNOSIS — R1084 Generalized abdominal pain: Secondary | ICD-10-CM

## 2023-11-21 DIAGNOSIS — J4489 Other specified chronic obstructive pulmonary disease: Secondary | ICD-10-CM | POA: Diagnosis present

## 2023-11-21 DIAGNOSIS — Z90710 Acquired absence of both cervix and uterus: Secondary | ICD-10-CM

## 2023-11-21 DIAGNOSIS — K859 Acute pancreatitis without necrosis or infection, unspecified: Principal | ICD-10-CM | POA: Diagnosis present

## 2023-11-21 DIAGNOSIS — K219 Gastro-esophageal reflux disease without esophagitis: Secondary | ICD-10-CM | POA: Diagnosis present

## 2023-11-21 DIAGNOSIS — Z90722 Acquired absence of ovaries, bilateral: Secondary | ICD-10-CM

## 2023-11-21 DIAGNOSIS — E876 Hypokalemia: Secondary | ICD-10-CM | POA: Diagnosis present

## 2023-11-21 DIAGNOSIS — H409 Unspecified glaucoma: Secondary | ICD-10-CM | POA: Diagnosis present

## 2023-11-21 DIAGNOSIS — Z8249 Family history of ischemic heart disease and other diseases of the circulatory system: Secondary | ICD-10-CM

## 2023-11-21 DIAGNOSIS — Z79899 Other long term (current) drug therapy: Secondary | ICD-10-CM

## 2023-11-21 DIAGNOSIS — Z86711 Personal history of pulmonary embolism: Secondary | ICD-10-CM

## 2023-11-21 DIAGNOSIS — F17210 Nicotine dependence, cigarettes, uncomplicated: Secondary | ICD-10-CM | POA: Diagnosis present

## 2023-11-21 DIAGNOSIS — Z886 Allergy status to analgesic agent status: Secondary | ICD-10-CM

## 2023-11-21 DIAGNOSIS — F17200 Nicotine dependence, unspecified, uncomplicated: Secondary | ICD-10-CM | POA: Diagnosis present

## 2023-11-21 DIAGNOSIS — J449 Chronic obstructive pulmonary disease, unspecified: Secondary | ICD-10-CM | POA: Diagnosis present

## 2023-11-21 DIAGNOSIS — E119 Type 2 diabetes mellitus without complications: Secondary | ICD-10-CM

## 2023-11-21 DIAGNOSIS — I10 Essential (primary) hypertension: Secondary | ICD-10-CM | POA: Diagnosis present

## 2023-11-21 DIAGNOSIS — Z880 Allergy status to penicillin: Secondary | ICD-10-CM

## 2023-11-21 DIAGNOSIS — T50995A Adverse effect of other drugs, medicaments and biological substances, initial encounter: Secondary | ICD-10-CM | POA: Diagnosis present

## 2023-11-21 DIAGNOSIS — Z87898 Personal history of other specified conditions: Secondary | ICD-10-CM

## 2023-11-21 DIAGNOSIS — Z7951 Long term (current) use of inhaled steroids: Secondary | ICD-10-CM

## 2023-11-21 LAB — I-STAT CHEM 8, ED
BUN: 6 mg/dL — ABNORMAL LOW (ref 8–23)
Calcium, Ion: 1.2 mmol/L (ref 1.15–1.40)
Chloride: 100 mmol/L (ref 98–111)
Creatinine, Ser: 0.9 mg/dL (ref 0.44–1.00)
Glucose, Bld: 192 mg/dL — ABNORMAL HIGH (ref 70–99)
HCT: 45 % (ref 36.0–46.0)
Hemoglobin: 15.3 g/dL — ABNORMAL HIGH (ref 12.0–15.0)
Potassium: 3.9 mmol/L (ref 3.5–5.1)
Sodium: 139 mmol/L (ref 135–145)
TCO2: 26 mmol/L (ref 22–32)

## 2023-11-21 LAB — COMPREHENSIVE METABOLIC PANEL WITH GFR
ALT: 13 U/L (ref 0–44)
AST: 14 U/L — ABNORMAL LOW (ref 15–41)
Albumin: 3.7 g/dL (ref 3.5–5.0)
Alkaline Phosphatase: 115 U/L (ref 38–126)
Anion gap: 9 (ref 5–15)
BUN: 6 mg/dL — ABNORMAL LOW (ref 8–23)
CO2: 27 mmol/L (ref 22–32)
Calcium: 9.7 mg/dL (ref 8.9–10.3)
Chloride: 101 mmol/L (ref 98–111)
Creatinine, Ser: 0.85 mg/dL (ref 0.44–1.00)
GFR, Estimated: >60 mL/min (ref >60)
Glucose, Bld: 194 mg/dL — ABNORMAL HIGH (ref 70–99)
Potassium: 3.8 mmol/L (ref 3.5–5.1)
Sodium: 137 mmol/L (ref 135–145)
Total Bilirubin: 0.7 mg/dL (ref 0.0–1.2)
Total Protein: 7.2 g/dL (ref 6.5–8.1)

## 2023-11-21 LAB — CBC
HCT: 44.5 % (ref 36.0–46.0)
Hemoglobin: 14.7 g/dL (ref 12.0–15.0)
MCH: 27 pg (ref 26.0–34.0)
MCHC: 33 g/dL (ref 30.0–36.0)
MCV: 81.8 fL (ref 80.0–100.0)
Platelets: 307 10*3/uL (ref 150–400)
RBC: 5.44 MIL/uL — ABNORMAL HIGH (ref 3.87–5.11)
RDW: 14.3 % (ref 11.5–15.5)
WBC: 5.3 10*3/uL (ref 4.0–10.5)
nRBC: 0 % (ref 0.0–0.2)

## 2023-11-21 LAB — CBG MONITORING, ED: Glucose-Capillary: 170 mg/dL — ABNORMAL HIGH (ref 70–99)

## 2023-11-21 LAB — LIPASE, BLOOD: Lipase: 96 U/L — ABNORMAL HIGH (ref 11–51)

## 2023-11-21 MED ORDER — IOHEXOL 350 MG/ML SOLN
75.0000 mL | Freq: Once | INTRAVENOUS | Status: AC | PRN
  Administered 2023-11-21: 75 mL via INTRAVENOUS

## 2023-11-21 NOTE — ED Notes (Signed)
 Pt stating that she is thirsty but refuses to drink from ED lobby water fountain.

## 2023-11-21 NOTE — ED Notes (Signed)
 Pt awaiting ride from her family member; wishes to wait in waiting area. Requesting PO fluids. Instructed pt to be NPO until ED tells her otherwise. Pt verbalized understanding.

## 2023-11-21 NOTE — ED Provider Triage Note (Signed)
 Emergency Medicine Provider Triage Evaluation Note  Sarah [name] , a 68 y.o. female  was evaluated in triage.  Pt complains of generalized abdominal pain x1 week. Last BM Tuesday.   Denies fever, cough, nausea, vomiting, diarrhea, dysuria, hematuria, hematochezia.   Review of Systems  Positive:  Negative:   Physical Exam  BP 123/73 (BP Location: Left Arm)   Pulse 91   Temp 97.6 F (36.4 C)   Resp 18   Ht 5\' 8"  (1.727 m)   Wt 92.1 kg   SpO2 99%   BMI 30.87 kg/m  Gen:   Awake, no distress   Resp:  Normal effort  MSK:   Moves extremities without difficulty  Other:    Medical Decision Making  Medically screening exam initiated at 2:29 PM.  Appropriate orders placed.  Mandeep J Isola was informed that the remainder of the evaluation will be completed by another provider, this initial triage assessment does not replace that evaluation, and the importance of remaining in the ED until their evaluation is complete.     Dorisann Garre F, [place] 11/21/23 1430

## 2023-11-21 NOTE — ED Provider Notes (Signed)
 [facility]    CSN: [id number] Arrival date & time: 11/21/23  1232      History   Chief Complaint Chief Complaint  Patient presents with   Abdominal Pain    HPI Sarah [name] is a 68 y.o. female.  Abdominal pain for 1 week that acutely worsened 2 days ago.  She rates the pain a 20 (twenty) out of 10 Some nausea but no vomiting Denies fever  History of diabetes, GERD, abdominal wall hernia, COPD, pancreatitis x2  Hysterectomy total in 1995  Past Medical History:  Diagnosis Date   Acute pancreatitis    01-13-2015 per ED note   Adrenal adenoma    bilateral   Arthritis    Asthma    Bilateral lower extremity edema    Complication of anesthesia    post-op 07-20-2014 surgery post-op Hypoxia due to acute CHF/ mild Pulmonary edema   COPD (chronic obstructive pulmonary disease) (HCC)    Depression    Gastritis    01-13-2015  per ED note   GERD (gastroesophageal reflux disease)    Glaucoma    Headache    History of acute congestive heart failure    07-22-2014   post-op surgery 07-20-2014   History of acute pancreatitis    01-04-2013    History of alcohol  abuse    state quit 2002/  drank daily >fifth of gin   History of DVT of lower extremity    2000  post op left knee surgery   History of pulmonary edema    mild -  07-22-2014  post-op  surgery 07-20-2014   Hypertension    Type 2 diabetes mellitus (HCC)    Ulnar nerve compression    right elbow    Patient Active Problem List   Diagnosis Date Noted   Pain due to onychomycosis of toenails of both feet 02/12/2022   Bilateral hand pain 01/30/2021   Pneumonia 11/22/2020   COPD with acute exacerbation (HCC) 11/22/2020   Pain in joint of left shoulder 01/08/2020   Sudden right hearing loss 03/20/2018   Dental infection 12/09/2017   Smokes 2 packs of cigarettes per day 12/09/2017   Recurrent epistaxis 12/09/2017   Adrenal adenoma 11/08/2017   Lipoma 01/10/2017   Gum disease 01/10/2017   DM neuropathy,  type II diabetes mellitus (HCC) 12/12/2015   GERD (gastroesophageal reflux disease) 03/02/2015   Abdominal wall hernia 01/05/2013   Diabetes type 2, controlled (HCC)    HTN (hypertension) 06/27/2011   TOBACCO ABUSE 02/05/2007   CONSTIPATION NOS 02/05/2007   OSTEOARTHROSIS, GENERALIZED, MULTIPLE SITES 02/05/2007   LOW BACK PAIN 02/05/2007    Past Surgical History:  Procedure Laterality Date   BIOPSY  10/19/2021   Procedure: BIOPSY;  Surgeon: Elois Hair, MD;  Location: [facility];  Service: Gastroenterology;;   COLONOSCOPY WITH PROPOFOL  N/A 10/19/2021   Procedure: COLONOSCOPY WITH PROPOFOL ;  Surgeon: Elois Hair, MD;  Location: [facility];  Service: Gastroenterology;  Laterality: N/A;   DILATION AND CURETTAGE OF UTERUS     ESOPHAGOGASTRODUODENOSCOPY (EGD) WITH PROPOFOL  N/A 10/19/2021   Procedure: ESOPHAGOGASTRODUODENOSCOPY (EGD) WITH PROPOFOL ;  Surgeon: Elois Hair, MD;  Location: [facility];  Service: Gastroenterology;  Laterality: N/A;   HEMOSTASIS CLIP PLACEMENT  10/19/2021   Procedure: HEMOSTASIS CLIP PLACEMENT;  Surgeon: Elois Hair, MD;  Location: [facility];  Service: Gastroenterology;;   INSERTION OF MESH N/A 07/20/2014   Procedure: INSERTION OF MESH;  Surgeon: Boyce Byes, MD;  Location: [facility];  Service:  General;  Laterality: N/A;   KNEE ARTHROSCOPY Left 2000   POLYPECTOMY  10/19/2021   Procedure: POLYPECTOMY;  Surgeon: Elois Hair, MD;  Location: [facility];  Service: Gastroenterology;;   TOTAL ABDOMINAL HYSTERECTOMY W/ BILATERAL SALPINGOOPHORECTOMY  1995   TRANSTHORACIC ECHOCARDIOGRAM  07-22-2014   mild LVH,  60-65%,  grade I diastolic dysfunction/  mild LAE   VENTRAL HERNIA REPAIR N/A 07/20/2014   Procedure: LAPAROSCOPIC REPAIR INCARCARATED VENTRAL HERNIA AND UMBILICAL HERNIA;  Surgeon: Boyce Byes, MD;  Location: [facility];  Service: General;  Laterality: N/A;    OB History   No obstetric history on file.      Home  Medications    Prior to Admission medications   Medication Sig Start Date End Date Taking? Authorizing Provider  acetaminophen  (TYLENOL ) 500 MG tablet Take 1,000 mg by mouth every 6 (six) hours as needed for moderate pain or mild pain.    [provider]  acetaminophen  (TYLENOL ) 650 MG CR tablet Take 1,300 mg by mouth every 8 (eight) hours as needed for pain.    [provider]  albuterol  (ACCUNEB ) 1.25 MG/3ML nebulizer solution Take 1 ampule by nebulization 3 (three) times daily as needed for wheezing or shortness of breath. 07/14/21   [provider]  albuterol  (PROAIR  HFA) 108 (90 Base) MCG/ACT inhaler Inhale 2 puffs into the lungs every 4 (four) hours as needed for wheezing or shortness of breath. 11/25/20   Lavaughn Portland, MD  amLODipine  (NORVASC ) 10 MG tablet Take 1 tablet (10 mg total) by mouth daily. 12/21/17   Lawrance Presume, MD  ammonium lactate (AMLACTIN) 12 % cream Apply 1 Application topically as needed for dry skin. 05/04/22  Yes [provider]  aspirin  EC 81 MG tablet Take 1 tablet (81 mg total) by mouth daily. 07/02/16   Marlan Silva, MD  ATROVENT  HFA 17 MCG/ACT inhaler Inhale 2 puffs into the lungs every 6 (six) hours as needed for wheezing. 07/14/21   [provider]  blood glucose meter kit and supplies KIT Libra Freestyle meter 03/14/17   Lawrance Presume, MD  Blood Glucose Monitoring Suppl (ONE TOUCH ULTRA MINI) w/Device KIT Use as directed to test blood sugar once daily. DX code E11.9 03/12/18   Lawrance Presume, MD  brimonidine-timolol (COMBIGAN) 0.2-0.5 % ophthalmic solution Apply 1 drop to eye 2 (two) times daily. 04/25/21  Yes [provider]  budesonide -formoterol  (SYMBICORT ) 160-4.5 MCG/ACT inhaler Inhale 2 puffs into the lungs 2 (two) times daily.   Yes [provider]  cyclobenzaprine  (FLEXERIL ) 5 MG tablet Take 5 mg by mouth 3 (three) times daily as needed for muscle spasms. 04/27/19  Yes [provider]  diclofenac Sodium (VOLTAREN) 1 % GEL Apply 2 g topically 4 (four) times daily. 06/27/22   [provider]  escitalopram  (LEXAPRO ) 10 MG tablet Take 1 tablet (10 mg total) by mouth daily. 11/25/20  Yes Lavaughn Portland, MD  fluticasone  (FLONASE ) 50 MCG/ACT nasal spray Place 2 sprays into both nostrils daily. 11/25/20  Yes Lavaughn Portland, MD  gabapentin  (NEURONTIN ) 300 MG capsule Take 300 mg by mouth 2 (two) times daily. 10/26/22  Yes [provider]  glimepiride  (AMARYL ) 1 MG tablet Take 1 tablet (1 mg total) by mouth 2 (two) times daily. 11/25/20   Lavaughn Portland, MD  glipiZIDE (GLUCOTROL) 10 MG tablet Take 10 mg by mouth 2 (two) times daily. 07/31/21  Yes [provider]  glucose blood (ONE TOUCH ULTRA TEST) test strip Use  as directed to test blood sugar once daily. DX code E11.9 03/12/18   Lawrance Presume, MD  hydrochlorothiazide  (HYDRODIURIL ) 25 MG tablet Take 1 tablet (25 mg total) by mouth daily. 10/28/17  Yes Lawrance Presume, MD  lisinopril  (PRINIVIL ,ZESTRIL ) 5 MG tablet Take 1 tablet (5 mg total) by mouth every morning. 11/19/17  Yes Lawrance Presume, MD  lubiprostone  (AMITIZA ) 24 MCG capsule Take 24 mcg by mouth 2 (two) times daily with a meal. Patient only takes it one time a day as two times a day is to strong for her   Yes [provider]  LUMIGAN  0.01 % SOLN Place 1 drop into both eyes at bedtime. 11/25/20  Yes Lavaughn Portland, MD  metFORMIN  (GLUCOPHAGE ) 1000 MG tablet Take 1,000 mg by mouth 2 (two) times daily with a meal.   Yes [provider]  methocarbamol (ROBAXIN) 500 MG tablet Take 500 mg by mouth 2 (two) times daily. 11/02/22   [provider]  omeprazole (PRILOSEC) 20 MG capsule Take 20 mg by mouth daily. 05/12/21  Yes [provider]  Raeford Bullion LANCETS 33G MISC Use as directed to test blood sugar once daily. DX code E11.9 03/12/18   Lawrance Presume, MD  pravastatin  (PRAVACHOL ) 20 MG tablet Take 1 tablet (20 mg total) by mouth daily.  08/12/17  Yes Lawrance Presume, MD  sodium chloride  (OCEAN) 0.65 % SOLN nasal spray Place 1 spray into both nostrils 2 (two) times daily. 11/25/20   Lavaughn Portland, MD  trimethoprim-polymyxin b (POLYTRIM) ophthalmic solution Place 1 drop into both eyes 4 (four) times daily. 07/10/22   [provider]    Family History Family History  Problem Relation Age of Onset   Coronary artery disease Father    Obesity Sister    Colon cancer Maternal Uncle    Cerebral aneurysm Maternal Grandmother        hemorrhage   Heart disease Daughter        died at birth   Colon polyps Neg Hx    Esophageal cancer Neg Hx    Rectal cancer Neg Hx    Stomach cancer Neg Hx     Social History Social History   Tobacco Use   Smoking status: Every Day    Current packs/day: 1.00    Average packs/day: 1 pack/day for 40.0 years (40.0 ttl pk-yrs)    Types: Cigarettes   Smokeless tobacco: Never  Vaping Use   Vaping status: Never Used  Substance Use Topics   Alcohol  use: No    Comment: hx alcohol  abuse- drank >fifth of gin daily/  states Quit 2002   Drug use: No     Allergies   Aspirin , Ibuprofen, Other, and Penicillins   Review of Systems Review of Systems  Gastrointestinal:  Positive for abdominal pain.   As per HPI   Physical Exam Triage Vital Signs ED Triage Vitals [11/21/23 1252]  Encounter Vitals Group     BP 105/61     Systolic BP Percentile      Diastolic BP Percentile      Pulse Rate 95     Resp 16     Temp 97.6 F (36.4 C)     Temp Source Oral     SpO2 96 %     Weight      Height      Head Circumference      Peak Flow      Pain Score 10     Pain  Loc      Pain Education      Exclude from Growth Chart    No data found.  Updated Vital Signs BP 105/61   Pulse 95   Temp 97.6 F (36.4 C) (Oral)   Resp 16   SpO2 96%   Visual Acuity Right Eye Distance:   Left Eye Distance:   Bilateral Distance:    Right Eye Near:   Left Eye Near:    Bilateral Near:     Physical  Exam Vitals and nursing note reviewed.  Constitutional:      Appearance: Normal appearance.     Comments: Patient is bent over in the exam chair  HENT:     Mouth/Throat:     Mouth: Mucous membranes are moist.     Pharynx: Oropharynx is clear.  Eyes:     Conjunctiva/sclera: Conjunctivae normal.  Cardiovascular:     Rate and Rhythm: Normal rate and regular rhythm.     Heart sounds: Normal heart sounds.  Pulmonary:     Effort: Pulmonary effort is normal. No respiratory distress.     Breath sounds: Normal breath sounds.  Abdominal:     General: Bowel sounds are normal.     Palpations: Abdomen is soft.     Tenderness: There is abdominal tenderness. There is guarding.     Comments: Abdominal pain throughout, worst epigastric with guarding. Patient yells with light palpation   Musculoskeletal:        General: Normal range of motion.  Skin:    General: Skin is warm and dry.  Neurological:     Mental Status: She is alert and oriented to person, place, and time.      UC Treatments / Results  Labs (all labs ordered are listed, but only abnormal results are displayed) Labs Reviewed  POCT URINALYSIS DIP (MANUAL ENTRY)    EKG   Radiology No results found.  Procedures Procedures (including critical care time)  Medications Ordered in UC Medications - No data to display  Initial Impression / Assessment and Plan / UC Course  I have reviewed the triage vital signs and the nursing notes.  Pertinent labs & imaging results that were available during my care of the patient were reviewed by me and considered in my medical decision making (see chart for details).  Afebrile, vitals are stable  With patient age and severity of abdominal pain, needs evaluation in emergency department. Higher level of care and advanced imaging not available in the urgent care setting.  Patient's niece will transport her to the emergency department across the street via POV  Final Clinical Impressions(s)  / UC Diagnoses   Final diagnoses:  Generalized abdominal pain     Discharge Instructions      Sent to ED for higher level of care  ED Prescriptions   None    PDMP not reviewed this encounter.   Willeen Novak, Ivette Marks, PA-C 11/21/23 1350

## 2023-11-21 NOTE — ED Notes (Signed)
 Pt stating that she feels like her sugar is low and would like something to eat. When attempting to speak and educate the pt on ED protocol this NT was told by the pt that she would have to pick the pt up off the floor the next time pt felt dizzy. Pt's blood glucose was checked and has been confirmed at 170.

## 2023-11-21 NOTE — Discharge Instructions (Signed)
Sent to ED for higher level of care 

## 2023-11-21 NOTE — ED Triage Notes (Addendum)
 C/O constant generalized abd pain onset approx 1.5 wks ago with progressive worsening.  States was given Rx med by her PCP fro enulose  and metoclopramide , but pt states it made pain worse so she discontinued 2 days ago. States has been having "tons" of BMs since then. Denies fevers.

## 2023-11-21 NOTE — ED Triage Notes (Signed)
 Pt. Stated, Sarah [name] had stomach pain for over a week. Denies any other symptoms.

## 2023-11-21 NOTE — ED Notes (Signed)
 Patient is being discharged from the Urgent Care and sent to the Emergency Department via private vehicle with family . Per R. Rising, PA, patient is in need of higher level of care due to abd pain. Patient is aware and verbalizes understanding of plan of care.  Vitals:   11/21/23 1252  BP: 105/61  Pulse: 95  Resp: 16  Temp: 97.6 F (36.4 C)  SpO2: 96%

## 2023-11-21 NOTE — ED Notes (Signed)
 Pt states she is unable to provide urine sample at this time. Pt drinking PO fluids.

## 2023-11-22 ENCOUNTER — Encounter (HOSPITAL_COMMUNITY): Payer: Self-pay | Admitting: Internal Medicine

## 2023-11-22 DIAGNOSIS — Z86718 Personal history of other venous thrombosis and embolism: Secondary | ICD-10-CM | POA: Diagnosis not present

## 2023-11-22 DIAGNOSIS — Z79899 Other long term (current) drug therapy: Secondary | ICD-10-CM | POA: Diagnosis not present

## 2023-11-22 DIAGNOSIS — F17210 Nicotine dependence, cigarettes, uncomplicated: Secondary | ICD-10-CM | POA: Diagnosis present

## 2023-11-22 DIAGNOSIS — I10 Essential (primary) hypertension: Secondary | ICD-10-CM | POA: Diagnosis present

## 2023-11-22 DIAGNOSIS — Z800 Family history of malignant neoplasm of digestive organs: Secondary | ICD-10-CM | POA: Diagnosis not present

## 2023-11-22 DIAGNOSIS — H409 Unspecified glaucoma: Secondary | ICD-10-CM | POA: Diagnosis present

## 2023-11-22 DIAGNOSIS — E876 Hypokalemia: Secondary | ICD-10-CM | POA: Diagnosis present

## 2023-11-22 DIAGNOSIS — Z7982 Long term (current) use of aspirin: Secondary | ICD-10-CM | POA: Diagnosis not present

## 2023-11-22 DIAGNOSIS — Z7951 Long term (current) use of inhaled steroids: Secondary | ICD-10-CM | POA: Diagnosis not present

## 2023-11-22 DIAGNOSIS — R109 Unspecified abdominal pain: Secondary | ICD-10-CM | POA: Diagnosis present

## 2023-11-22 DIAGNOSIS — Z7984 Long term (current) use of oral hypoglycemic drugs: Secondary | ICD-10-CM | POA: Diagnosis not present

## 2023-11-22 DIAGNOSIS — Z880 Allergy status to penicillin: Secondary | ICD-10-CM | POA: Diagnosis not present

## 2023-11-22 DIAGNOSIS — R1084 Generalized abdominal pain: Secondary | ICD-10-CM | POA: Diagnosis not present

## 2023-11-22 DIAGNOSIS — T50995A Adverse effect of other drugs, medicaments and biological substances, initial encounter: Secondary | ICD-10-CM | POA: Diagnosis present

## 2023-11-22 DIAGNOSIS — J4489 Other specified chronic obstructive pulmonary disease: Secondary | ICD-10-CM | POA: Diagnosis present

## 2023-11-22 DIAGNOSIS — Z90710 Acquired absence of both cervix and uterus: Secondary | ICD-10-CM | POA: Diagnosis not present

## 2023-11-22 DIAGNOSIS — K859 Acute pancreatitis without necrosis or infection, unspecified: Secondary | ICD-10-CM

## 2023-11-22 DIAGNOSIS — Z86711 Personal history of pulmonary embolism: Secondary | ICD-10-CM | POA: Diagnosis not present

## 2023-11-22 DIAGNOSIS — E1142 Type 2 diabetes mellitus with diabetic polyneuropathy: Secondary | ICD-10-CM | POA: Diagnosis present

## 2023-11-22 DIAGNOSIS — K853 Drug induced acute pancreatitis without necrosis or infection: Secondary | ICD-10-CM | POA: Diagnosis present

## 2023-11-22 DIAGNOSIS — Z87898 Personal history of other specified conditions: Secondary | ICD-10-CM | POA: Diagnosis not present

## 2023-11-22 DIAGNOSIS — Z8249 Family history of ischemic heart disease and other diseases of the circulatory system: Secondary | ICD-10-CM | POA: Diagnosis not present

## 2023-11-22 DIAGNOSIS — Z886 Allergy status to analgesic agent status: Secondary | ICD-10-CM | POA: Diagnosis not present

## 2023-11-22 DIAGNOSIS — Z90722 Acquired absence of ovaries, bilateral: Secondary | ICD-10-CM | POA: Diagnosis not present

## 2023-11-22 DIAGNOSIS — K219 Gastro-esophageal reflux disease without esophagitis: Secondary | ICD-10-CM | POA: Diagnosis present

## 2023-11-22 LAB — GLUCOSE, CAPILLARY
Glucose-Capillary: 129 mg/dL — ABNORMAL HIGH (ref 70–99)
Glucose-Capillary: 138 mg/dL — ABNORMAL HIGH (ref 70–99)
Glucose-Capillary: 181 mg/dL — ABNORMAL HIGH (ref 70–99)
Glucose-Capillary: 117 mg/dL — ABNORMAL HIGH (ref 70–99)
Glucose-Capillary: 167 mg/dL — ABNORMAL HIGH (ref 70–99)

## 2023-11-22 LAB — LIPID PANEL
Cholesterol: 156 mg/dL (ref 0–200)
HDL: 25 mg/dL — ABNORMAL LOW (ref >40)
LDL Cholesterol: 104 mg/dL — ABNORMAL HIGH (ref 0–99)
Total CHOL/HDL Ratio: 6.2 ratio
Triglycerides: 133 mg/dL (ref <150)
VLDL: 27 mg/dL (ref 0–40)

## 2023-11-22 LAB — URINALYSIS, ROUTINE W REFLEX MICROSCOPIC
Bilirubin Urine: NEGATIVE
Glucose, UA: NEGATIVE mg/dL
Hgb urine dipstick: NEGATIVE
Ketones, ur: NEGATIVE mg/dL
Leukocytes,Ua: NEGATIVE
Nitrite: NEGATIVE
Protein, ur: NEGATIVE mg/dL
Specific Gravity, Urine: >1.046 — ABNORMAL HIGH (ref 1.005–1.030)
pH: 5 (ref 5.0–8.0)

## 2023-11-22 LAB — LACTIC ACID, PLASMA
Lactic Acid, Venous: 1.6 mmol/L (ref 0.5–1.9)
Lactic Acid, Venous: 0.9 mmol/L (ref 0.5–1.9)

## 2023-11-22 LAB — HEMOGLOBIN A1C
Hgb A1c MFr Bld: 7 % — ABNORMAL HIGH (ref 4.8–5.6)
Mean Plasma Glucose: 154.2 mg/dL

## 2023-11-22 LAB — HIV ANTIBODY (ROUTINE TESTING W REFLEX): HIV Screen 4th Generation wRfx: NONREACTIVE

## 2023-11-22 LAB — TROPONIN I (HIGH SENSITIVITY)
Troponin I (High Sensitivity): 4 ng/L (ref <18)
Troponin I (High Sensitivity): 5 ng/L (ref <18)

## 2023-11-22 MED ORDER — INSULIN ASPART 100 UNIT/ML IJ SOLN: 0.0000 [IU] | INTRAMUSCULAR | Status: DC | PRN

## 2023-11-22 MED ORDER — ACETAMINOPHEN 650 MG RE SUPP
650.0000 mg | Freq: Four times a day (QID) | RECTAL | Status: DC | PRN
Start: 2023-11-22 — End: 2023-11-22

## 2023-11-22 MED ORDER — ASPIRIN 81 MG PO TBEC
81.0000 mg | DELAYED_RELEASE_TABLET | Freq: Every day | ORAL | Status: DC
  Administered 2023-11-22 – 2023-11-25 (×4): 81 mg via ORAL
  Filled 2023-11-22 (×4): qty 1

## 2023-11-22 MED ORDER — PANTOPRAZOLE SODIUM 40 MG IV SOLR
40.0000 mg | Freq: Two times a day (BID) | INTRAVENOUS | Status: DC
  Administered 2023-11-22 – 2023-11-24 (×6): 40 mg via INTRAVENOUS
  Filled 2023-11-22 (×6): qty 10

## 2023-11-22 MED ORDER — HYDRALAZINE HCL 20 MG/ML IJ SOLN: 5.0000 mg | INTRAMUSCULAR | Status: DC | PRN

## 2023-11-22 MED ORDER — LACTATED RINGERS IV SOLN: Freq: Once | INTRAVENOUS | Status: AC

## 2023-11-22 MED ORDER — PRAVASTATIN SODIUM 10 MG PO TABS
20.0000 mg | ORAL_TABLET | Freq: Every day | ORAL | Status: DC
  Administered 2023-11-22 – 2023-11-24 (×3): 20 mg via ORAL
  Filled 2023-11-22 (×3): qty 2

## 2023-11-22 MED ORDER — INSULIN ASPART 100 UNIT/ML IJ SOLN
0.0000 [IU] | Freq: Three times a day (TID) | INTRAMUSCULAR | Status: DC
  Administered 2023-11-22 – 2023-11-23 (×2): 2 [IU] via SUBCUTANEOUS
  Administered 2023-11-23: 1 [IU] via SUBCUTANEOUS
  Administered 2023-11-23: 3 [IU] via SUBCUTANEOUS
  Administered 2023-11-24: 2 [IU] via SUBCUTANEOUS
  Administered 2023-11-24: 1 [IU] via SUBCUTANEOUS
  Administered 2023-11-24 – 2023-11-25 (×2): 3 [IU] via SUBCUTANEOUS
  Administered 2023-11-25: 2 [IU] via SUBCUTANEOUS

## 2023-11-22 MED ORDER — MORPHINE SULFATE (PF) 2 MG/ML IV SOLN
2.0000 mg | INTRAVENOUS | Status: DC | PRN
  Administered 2023-11-22: 2 mg via INTRAVENOUS
  Filled 2023-11-22: qty 1

## 2023-11-22 MED ORDER — OXYCODONE HCL 5 MG PO TABS
5.0000 mg | ORAL_TABLET | ORAL | Status: DC | PRN
  Administered 2023-11-23: 10 mg via ORAL
  Filled 2023-11-22 (×2): qty 2

## 2023-11-22 MED ORDER — ENOXAPARIN SODIUM 40 MG/0.4ML IJ SOSY
40.0000 mg | PREFILLED_SYRINGE | INTRAMUSCULAR | Status: DC
  Administered 2023-11-22 – 2023-11-23 (×2): 40 mg via SUBCUTANEOUS
  Filled 2023-11-22 (×4): qty 0.4

## 2023-11-22 MED ORDER — NICOTINE 21 MG/24HR TD PT24
21.0000 mg | MEDICATED_PATCH | Freq: Every day | TRANSDERMAL | Status: DC
  Administered 2023-11-22 – 2023-11-25 (×4): 21 mg via TRANSDERMAL
  Filled 2023-11-22 (×4): qty 1

## 2023-11-22 MED ORDER — ACETAMINOPHEN 325 MG PO TABS: 650.0000 mg | ORAL_TABLET | Freq: Four times a day (QID) | ORAL | Status: DC | PRN

## 2023-11-22 MED ORDER — LACTATED RINGERS IV BOLUS
1000.0000 mL | Freq: Once | INTRAVENOUS | Status: AC
  Administered 2023-11-22: 1000 mL via INTRAVENOUS

## 2023-11-22 MED ORDER — ONDANSETRON HCL 4 MG/2ML IJ SOLN
4.0000 mg | Freq: Four times a day (QID) | INTRAMUSCULAR | Status: DC | PRN
  Administered 2023-11-22 – 2023-11-24 (×2): 4 mg via INTRAVENOUS
  Filled 2023-11-22 (×2): qty 2

## 2023-11-22 MED ORDER — FENTANYL CITRATE PF 50 MCG/ML IJ SOSY
50.0000 ug | PREFILLED_SYRINGE | Freq: Once | INTRAMUSCULAR | Status: AC
  Administered 2023-11-22: 50 ug via INTRAVENOUS
  Filled 2023-11-22: qty 1

## 2023-11-22 MED ORDER — ALBUTEROL SULFATE (2.5 MG/3ML) 0.083% IN NEBU: 2.5000 mg | INHALATION_SOLUTION | RESPIRATORY_TRACT | Status: DC | PRN

## 2023-11-22 MED ORDER — ONDANSETRON HCL 4 MG PO TABS
4.0000 mg | ORAL_TABLET | Freq: Four times a day (QID) | ORAL | Status: DC | PRN
  Administered 2023-11-24: 4 mg via ORAL
  Filled 2023-11-22: qty 1

## 2023-11-22 MED ORDER — ONDANSETRON HCL 4 MG/2ML IJ SOLN
4.0000 mg | Freq: Once | INTRAMUSCULAR | Status: AC
  Administered 2023-11-22: 4 mg via INTRAVENOUS
  Filled 2023-11-22: qty 2

## 2023-11-22 MED ORDER — AMLODIPINE BESYLATE 5 MG PO TABS: 10.0000 mg | ORAL_TABLET | Freq: Every day | ORAL | Status: DC

## 2023-11-22 NOTE — H&P (Signed)
 History and Physical    Patient: Sarah [name] ZOX:[id number] DOB: 1955-08-19 DOA: 11/21/2023 DOS: the patient was seen and examined on 11/22/2023 PCP: Olene Berne, MD  Patient coming from: Home Chief complaint: Chief Complaint  Patient presents with   Abdominal Pain   HPI:  Sarah [name] is a 68 y.o. female with past medical history  of COPD, chronic tobacco use and remote history of alcohol  induced pancreatitis, allergies to ibuprofen penicillin and aspirin , patient also has history of COPD, tobacco abuse, history of DVT and PE, essential hypertension, diabetes mellitus type 2 presenting today with abdominal pain that is been going on for about a week patient states sh has not noticed anything alleviating the abdominal pain.   Otherwise is a ltd historian.    ED Course: Pt in ed at bedside  is alert awake oriented phlebotomist at bedside afebrile. Vital signs in the ED were notable for the following:  Vitals:   11/22/23 0100 11/22/23 0115 11/22/23 0300 11/22/23 0350  BP:  (!) 142/73 (!) 141/69 (!) 146/95  Pulse: 82 80 72 72  Temp:   98.2 F (36.8 C)   Resp:  (!) 23 15 17   Height:      Weight:      SpO2: 100% 99% 98% 97%  TempSrc:      BMI (Calculated):      >>ED evaluation thus far shows: CMP showing glucose of 194 lipase 96 AST 14 otherwise normal electrolytes and kidney function. Normal CBC.  CT shows mild peripancreatic stranding around the pancreatic head and uncinate process consistent with acute interstitial edema that is pancreatitis please see complete report.   >>While in the ED patient received the following:    Review of Systems  Gastrointestinal:  Positive for abdominal pain.   Past Medical History:  Diagnosis Date   Acute pancreatitis    01-13-2015 per ED note   Adrenal adenoma    bilateral   Arthritis    Asthma    Bilateral lower extremity edema    Complication of anesthesia    post-op 07-20-2014 surgery post-op Hypoxia due to acute CHF/  mild Pulmonary edema   COPD (chronic obstructive pulmonary disease) (HCC)    Depression    Gastritis    01-13-2015  per ED note   GERD (gastroesophageal reflux disease)    Glaucoma    Headache    History of acute congestive heart failure    07-22-2014   post-op surgery 07-20-2014   History of acute pancreatitis    01-04-2013    History of alcohol  abuse    state quit 2002/  drank daily >fifth of gin   History of DVT of lower extremity    2000  post op left knee surgery   History of pulmonary edema    mild -  07-22-2014  post-op  surgery 07-20-2014   Hypertension    Type 2 diabetes mellitus (HCC)    Ulnar nerve compression    right elbow   Past Surgical History:  Procedure Laterality Date   BIOPSY  10/19/2021   Procedure: BIOPSY;  Surgeon: Elois Hair, MD;  Location: [facility];  Service: Gastroenterology;;   COLONOSCOPY WITH PROPOFOL  N/A 10/19/2021   Procedure: COLONOSCOPY WITH PROPOFOL ;  Surgeon: Elois Hair, MD;  Location: [facility];  Service: Gastroenterology;  Laterality: N/A;   DILATION AND CURETTAGE OF UTERUS     ESOPHAGOGASTRODUODENOSCOPY (EGD) WITH PROPOFOL  N/A 10/19/2021   Procedure: ESOPHAGOGASTRODUODENOSCOPY (EGD) WITH PROPOFOL ;  Surgeon: Darol Elizabeth  E, MD;  Location: [facility];  Service: Gastroenterology;  Laterality: N/A;   HEMOSTASIS CLIP PLACEMENT  10/19/2021   Procedure: HEMOSTASIS CLIP PLACEMENT;  Surgeon: Elois Hair, MD;  Location: [facility];  Service: Gastroenterology;;   INSERTION OF MESH N/A 07/20/2014   Procedure: INSERTION OF MESH;  Surgeon: Boyce Byes, MD;  Location: [facility];  Service: General;  Laterality: N/A;   KNEE ARTHROSCOPY Left 2000   POLYPECTOMY  10/19/2021   Procedure: POLYPECTOMY;  Surgeon: Elois Hair, MD;  Location: [facility];  Service: Gastroenterology;;   TOTAL ABDOMINAL HYSTERECTOMY W/ BILATERAL SALPINGOOPHORECTOMY  1995   TRANSTHORACIC ECHOCARDIOGRAM  07-22-2014   mild LVH,  60-65%,  grade I  diastolic dysfunction/  mild LAE   VENTRAL HERNIA REPAIR N/A 07/20/2014   Procedure: LAPAROSCOPIC REPAIR INCARCARATED VENTRAL HERNIA AND UMBILICAL HERNIA;  Surgeon: Boyce Byes, MD;  Location: [facility];  Service: General;  Laterality: N/A;    reports that she has been smoking cigarettes. She has a 40 pack-year smoking history. She has never used smokeless tobacco. She reports that she does not drink alcohol  and does not use drugs. Allergies  Allergen Reactions   Aspirin  Other (See Comments)    Causes her stomach pain.   Ibuprofen Other (See Comments)    Causes stomach pain   Other    Penicillins Other (See Comments)    Localized whelps and redness after injection Has patient had a PCN reaction causing immediate rash, facial/tongue/throat swelling, SOB or lightheadedness with hypotension: No Has patient had a PCN reaction causing severe rash involving mucus membranes or skin necrosis: Yes Has patient had a PCN reaction that required hospitalization No Has patient had a PCN reaction occurring within the last 10 years: No If all of the above answers are "NO", then may proceed with Cephalosporin use.    Family History  Problem Relation Age of Onset   Coronary artery disease Father    Obesity Sister    Colon cancer Maternal Uncle    Cerebral aneurysm Maternal Grandmother        hemorrhage   Heart disease Daughter        died at birth   Colon polyps Neg Hx    Esophageal cancer Neg Hx    Rectal cancer Neg Hx    Stomach cancer Neg Hx    Prior to Admission medications   Medication Sig Start Date End Date Taking? Authorizing Provider  acetaminophen  (TYLENOL ) 500 MG tablet Take 1,000 mg by mouth every 6 (six) hours as needed for moderate pain or mild pain.    [provider]  acetaminophen  (TYLENOL ) 650 MG CR tablet Take 1,300 mg by mouth every 8 (eight) hours as needed for pain.    [provider]  albuterol  (ACCUNEB ) 1.25 MG/3ML nebulizer solution Take 1 ampule by  nebulization 3 (three) times daily as needed for wheezing or shortness of breath. 07/14/21   [provider]  albuterol  (PROAIR  HFA) 108 (90 Base) MCG/ACT inhaler Inhale 2 puffs into the lungs every 4 (four) hours as needed for wheezing or shortness of breath. 11/25/20   Lavaughn Portland, MD  amLODipine  (NORVASC ) 10 MG tablet Take 1 tablet (10 mg total) by mouth daily. 12/21/17   Lawrance Presume, MD  ammonium lactate (AMLACTIN) 12 % cream Apply 1 Application topically as needed for dry skin. 05/04/22   [provider]  aspirin  EC 81 MG tablet Take 1 tablet (81 mg total) by mouth daily. 07/02/16   Langeland, Dawn T,  MD  ATROVENT  HFA 17 MCG/ACT inhaler Inhale 2 puffs into the lungs every 6 (six) hours as needed for wheezing. 07/14/21   [provider]  blood glucose meter kit and supplies KIT Libra Freestyle meter 03/14/17   Lawrance Presume, MD  Blood Glucose Monitoring Suppl (ONE TOUCH ULTRA MINI) w/Device KIT Use as directed to test blood sugar once daily. DX code E11.9 03/12/18   Lawrance Presume, MD  brimonidine-timolol (COMBIGAN) 0.2-0.5 % ophthalmic solution Apply 1 drop to eye 2 (two) times daily. 04/25/21   [provider]  budesonide -formoterol  (SYMBICORT ) 160-4.5 MCG/ACT inhaler Inhale 2 puffs into the lungs 2 (two) times daily.    [provider]  cyclobenzaprine  (FLEXERIL ) 5 MG tablet Take 5 mg by mouth 3 (three) times daily as needed for muscle spasms. 04/27/19   [provider]  diclofenac Sodium (VOLTAREN) 1 % GEL Apply 2 g topically 4 (four) times daily. 06/27/22   [provider]  escitalopram  (LEXAPRO ) 10 MG tablet Take 1 tablet (10 mg total) by mouth daily. 11/25/20   Lavaughn Portland, MD  fluticasone  (FLONASE ) 50 MCG/ACT nasal spray Place 2 sprays into both nostrils daily. 11/25/20   Lavaughn Portland, MD  gabapentin  (NEURONTIN ) 300 MG capsule Take 300 mg by mouth 2 (two) times daily. 10/26/22   [provider]  glimepiride  (AMARYL ) 1 MG  tablet Take 1 tablet (1 mg total) by mouth 2 (two) times daily. 11/25/20   Lavaughn Portland, MD  glipiZIDE (GLUCOTROL) 10 MG tablet Take 10 mg by mouth 2 (two) times daily. 07/31/21   [provider]  glucose blood (ONE TOUCH ULTRA TEST) test strip Use as directed to test blood sugar once daily. DX code E11.9 03/12/18   Lawrance Presume, MD  hydrochlorothiazide  (HYDRODIURIL ) 25 MG tablet Take 1 tablet (25 mg total) by mouth daily. 10/28/17   Lawrance Presume, MD  lisinopril  (PRINIVIL ,ZESTRIL ) 5 MG tablet Take 1 tablet (5 mg total) by mouth every morning. 11/19/17   Lawrance Presume, MD  lubiprostone  (AMITIZA ) 24 MCG capsule Take 24 mcg by mouth 2 (two) times daily with a meal. Patient only takes it one time a day as two times a day is to strong for her    [provider]  LUMIGAN  0.01 % SOLN Place 1 drop into both eyes at bedtime. 11/25/20   Lavaughn Portland, MD  metFORMIN  (GLUCOPHAGE ) 1000 MG tablet Take 1,000 mg by mouth 2 (two) times daily with a meal.    [provider]  methocarbamol (ROBAXIN) 500 MG tablet Take 500 mg by mouth 2 (two) times daily. 11/02/22   [provider]  omeprazole (PRILOSEC) 20 MG capsule Take 20 mg by mouth daily. 05/12/21   [provider]  Raeford Bullion LANCETS 33G MISC Use as directed to test blood sugar once daily. DX code E11.9 03/12/18   Lawrance Presume, MD  pravastatin  (PRAVACHOL ) 20 MG tablet Take 1 tablet (20 mg total) by mouth daily. 08/12/17   Lawrance Presume, MD  sodium chloride  (OCEAN) 0.65 % SOLN nasal spray Place 1 spray into both nostrils 2 (two) times daily. 11/25/20   Lavaughn Portland, MD  trimethoprim-polymyxin b (POLYTRIM) ophthalmic solution Place 1 drop into both eyes 4 (four) times daily. 07/10/22   [provider]  Vitals:   11/22/23 0100 11/22/23 0115 11/22/23 0300 11/22/23 0350  BP:  (!) 142/73 (!) 141/69 (!) 146/95  Pulse: 82 80 72  72  Resp:  (!) 23 15 17   Temp:   98.2 F (36.8 C)   TempSrc:      SpO2: 100% 99% 98% 97%  Weight:      Height:       Physical Exam Vitals and nursing note reviewed.  Constitutional:      General: She is not in acute distress. HENT:     Head: Normocephalic and atraumatic.     Right Ear: Hearing normal.     Left Ear: Hearing normal.     Nose: No nasal deformity.     Mouth/Throat:     Lips: Pink.  Eyes:     General: Lids are normal.     Extraocular Movements: Extraocular movements intact.  Cardiovascular:     Rate and Rhythm: Normal rate and regular rhythm.     Heart sounds: Normal heart sounds.  Pulmonary:     Effort: Pulmonary effort is normal.     Breath sounds: Normal breath sounds.  Abdominal:     General: Bowel sounds are normal. There is no distension.     Palpations: Abdomen is soft. There is no mass.     Tenderness: There is abdominal tenderness. There is guarding.    Musculoskeletal:     Right lower leg: No edema.     Left lower leg: No edema.  Skin:    General: Skin is warm.  Neurological:     General: No focal deficit present.     Mental Status: She is alert and oriented to person, place, and time.     Cranial Nerves: Cranial nerves 2-12 are intact.  Psychiatric:        Speech: Speech normal.     Labs on Admission: I have personally reviewed following labs and imaging studies CBC: Recent Labs  Lab 11/21/23 1429 11/21/23 1445  WBC 5.3  --   HGB 14.7 15.3*  HCT 44.5 45.0  MCV 81.8  --   PLT 307  --    Basic Metabolic Panel: Recent Labs  Lab 11/21/23 1429 11/21/23 1445  NA 137 139  K 3.8 3.9  CL 101 100  CO2 27  --   GLUCOSE 194* 192*  BUN 6* 6*  CREATININE 0.85 0.90  CALCIUM 9.7  --    GFR: Estimated Creatinine Clearance: 69 mL/min (by C-G formula based on SCr of 0.9 mg/dL). Liver Function Tests: Recent Labs  Lab 11/21/23 1429  AST 14*  ALT 13  ALKPHOS 115  BILITOT 0.7  PROT 7.2  ALBUMIN 3.7   Recent Labs  Lab  11/21/23 1429  LIPASE 96*   No results for input(s): "AMMONIA" in the last 168 hours. Coagulation Profile: No results for input(s): "INR", "PROTIME" in the last 168 hours. Cardiac Enzymes: No results for input(s): "CKTOTAL", "CKMB", "CKMBINDEX", "TROPONINI" in the last 168 hours. BNP (last 3 results) No results for input(s): "PROBNP" in the last 8760 hours. HbA1C: Recent Labs    11/22/23 0259  HGBA1C 7.0*   CBG: Recent Labs  Lab 11/21/23 2146  GLUCAP 170*   Lipid Profile: Recent Labs    11/22/23 0255  CHOL 156  HDL 25*  LDLCALC 104*  TRIG 133  CHOLHDL 6.2   Thyroid Function Tests: No results for input(s): "TSH", "T4TOTAL", "FREET4", "T3FREE", "THYROIDAB" in the last 72 hours. Anemia Panel: No results for input(s): "  VITAMINB12", "FOLATE", "FERRITIN", "TIBC", "IRON", "RETICCTPCT" in the last 72 hours. Urine analysis:    Component Value Date/Time   COLORURINE YELLOW 04/10/2021 2121   APPEARANCEUR HAZY (A) 04/10/2021 2121   LABSPEC 1.012 04/10/2021 2121   PHURINE 6.0 04/10/2021 2121   GLUCOSEU >=500 (A) 04/10/2021 2121   HGBUR NEGATIVE 04/10/2021 2121   BILIRUBINUR NEGATIVE 04/10/2021 2121   KETONESUR NEGATIVE 04/10/2021 2121   PROTEINUR NEGATIVE 04/10/2021 2121   UROBILINOGEN 1.0 07/01/2014 1016   NITRITE NEGATIVE 04/10/2021 2121   LEUKOCYTESUR NEGATIVE 04/10/2021 2121   Radiological Exams on Admission: CT ABDOMEN PELVIS W CONTRAST Result Date: 11/21/2023 CLINICAL DATA:  One-week history of generalized abdominal pain EXAM: CT ABDOMEN AND PELVIS WITH CONTRAST TECHNIQUE: Multidetector CT imaging of the abdomen and pelvis was performed using the standard protocol following bolus administration of intravenous contrast. RADIATION DOSE REDUCTION: This exam was performed according to the departmental dose-optimization program which includes automated exposure control, adjustment of the mA and/or kV according to patient size and/or use of iterative reconstruction technique.  CONTRAST:  75mL OMNIPAQUE  IOHEXOL  350 MG/ML SOLN COMPARISON:  CT abdomen and pelvis dated 04/11/2021 FINDINGS: Lower chest: Subpleural 4 mm left lower lobe nodule (4:5), unchanged, likely benign. No specific follow-up imaging recommended. No pleural effusion or pneumothorax demonstrated. Partially imaged heart size is normal. Hepatobiliary: No focal hepatic lesions. No intra or extrahepatic biliary ductal dilation. Normal gallbladder. Pancreas: No main pancreatic ductal dilation or focal lesion. Mild peripancreatic stranding surrounding the pancreatic head and uncinate process. Spleen: Normal in size without focal abnormality. Adrenals/Urinary Tract: Unchanged 19 mm right adrenal nodule measuring 45 HU and 26 mm left adrenal nodule measuring 54 HU, likely adenomas given stability. No specific follow-up imaging recommended. No suspicious renal mass, calculi or hydronephrosis. Simple left upper pole renal cyst measuring 3.3 cm. No specific follow-up imaging recommended. Additional bilateral subcentimeter hypodensities, too small to characterize but also likely cysts. No focal bladder wall thickening. Stomach/Bowel: Normal appearance of the stomach. No evidence of bowel wall thickening, distention, or inflammatory changes. Hyperattenuating intraluminal material within the cecum and ascending colon. Normal appendix. Vascular/Lymphatic: Aortic atherosclerosis. No enlarged abdominal or pelvic lymph nodes. Reproductive: No adnexal masses. Other: No free fluid, fluid collection, or free air. Musculoskeletal: No acute or abnormal lytic or blastic osseous lesions. Multilevel degenerative changes of the partially imaged thoracic and lumbar spine. Interval development of disc extrusion at the level of L4-5 with new grade 1 anterolisthesis. IMPRESSION: 1. Mild peripancreatic stranding surrounding the pancreatic head and uncinate process, which may represent acute interstitial edematous pancreatitis. No peripancreatic fluid  collection. 2. Degenerative changes of the lumbar spine with interval development of disc extrusion at L4-5 and new grade 1 anterolisthesis. 3.  Aortic Atherosclerosis (ICD10-I70.0). Electronically Signed   By: Limin  Xu M.D.   On: 11/21/2023 18:12   Data Reviewed: Relevant notes from primary care and specialist visits, past discharge summaries as available in EHR, including Care Everywhere. Prior diagnostic testing as pertinent to current admission diagnoses, Updated medications and problem lists for reconciliation ED course, including vitals, labs, imaging, treatment and response to treatment,Triage notes, nursing and pharmacy notes and ED provider's notes Notable results as noted in HPI.Discussed case with EDMD/ ED APP/ or Specialty MD on call and as needed.  Assessment & Plan  >>Abdominal pain / Acute pancreatitis: Will admit patient to med/tele with cardiac monitoring as well. N.p.o., IV fluids, IV PPI.  Lactic acid is pending.  Suspect patient would benefit from an abdominal mesenteric circulation evaluation  to rule out abdominal angina which we will defer to GI. Another differential is any intra-abdominal issues patient has a history of serrated polyposis syndrome.  Will defer to GI for follow-up colonoscopies..  >>Tobacco abuse/COPD:: Albuterol  and as needed DuoNeb. Nicotine  patch. Counseling once pt is feeling better.    >>DM II: N.p.o., glycemic protocol with Accu-Cheks every 4 hours..   >>GERD: IV PPI.    DVT prophylaxis:  Heparin   Consults:  None.  Advance Care Planning:    Code Status: Full Code   Family Communication:  None.  Disposition Plan:  Home.  Severity of Illness: The appropriate patient status for this patient is OBSERVATION. Observation status is judged to be reasonable and necessary in order to provide the required intensity of service to ensure the patient's safety. The patient's presenting symptoms, physical exam findings, and initial radiographic and  laboratory data in the context of their medical condition is felt to place them at decreased risk for further clinical deterioration. Furthermore, it is anticipated that the patient will be medically stable for discharge from the hospital within 2 midnights of admission.   Unresulted Labs (From admission, onward)     Start     Ordered   11/22/23 0258  HIV Antibody (routine testing w rflx)  (HIV Antibody (Routine testing w reflex) panel)  Once,   R        11/22/23 0258   11/22/23 0255  Lactic acid, plasma  (Lactic Acid)  STAT Now then every 3 hours,   R      11/22/23 0257   11/21/23 1429  Urinalysis, Routine w reflex microscopic -Urine, Clean Catch  Once,   URGENT       Question:  Specimen Source  Answer:  Urine, Clean Catch   11/21/23 1428            Meds ordered this encounter  Medications   iohexol  (OMNIPAQUE ) 350 MG/ML injection 75 mL   lactated ringers  bolus 1,000 mL   ondansetron  (ZOFRAN ) injection 4 mg   fentaNYL  (SUBLIMAZE ) injection 50 mcg   lactated ringers  bolus 1,000 mL   pantoprazole  (PROTONIX ) injection 40 mg   lactated ringers  infusion   OR Linked Order Group    acetaminophen  (TYLENOL ) tablet 650 mg    acetaminophen  (TYLENOL ) suppository 650 mg   morphine  (PF) 2 MG/ML injection 2 mg   OR Linked Order Group    ondansetron  (ZOFRAN ) tablet 4 mg    ondansetron  (ZOFRAN ) injection 4 mg   hydrALAZINE  (APRESOLINE ) injection 5 mg   DISCONTD: amLODipine  (NORVASC ) tablet 10 mg   aspirin  EC tablet 81 mg   pravastatin  (PRAVACHOL ) tablet 20 mg   albuterol  (PROVENTIL ) (2.5 MG/3ML) 0.083% nebulizer solution 2.5 mg   insulin  aspart (novoLOG ) injection 0-9 Units    Correction coverage::   Sensitive (thin, NPO, renal)    CBG < 70::   Implement Hypoglycemia Standing Orders and refer to Hypoglycemia Standing Orders sidebar report    CBG 70 - 120::   0 units    CBG 121 - 150::   1 unit    CBG 151 - 200::   2 units    CBG 201 - 250::   3 units    CBG 251 - 300::   5 units     CBG 301 - 350::   7 units    CBG 351 - 400:   9 units    CBG > 400:   call MD and obtain STAT lab verification  Orders Placed This Encounter  Procedures   CT ABDOMEN PELVIS W CONTRAST   Lipase, blood   Comprehensive metabolic panel   CBC   Urinalysis, Routine w reflex microscopic -Urine, Clean Catch   Lactic acid, plasma   Lipid panel   HIV Antibody (routine testing w rflx)   Hemoglobin A1c   Diet NPO time specified   SCDs   Cardiac Monitoring Continuous x 24 hours Indications for use: Other; other indications for use: Monitor for ischemia   Vital signs   Notify physician (specify)   Mobility Protocol: No Restrictions RN to initiate protocols based on patient's level of care   Refer to Sidebar Report Refer to ICU, Med-Surg, Progressive, and Step-Down Mobility Protocol Sidebars   Initiate Adult Central Line Maintenance and Catheter Protocol for patients with central line (CVC, PICC, Port, Hemodialysis, Trialysis)   If patient diabetic or glucose greater than 140 notify physician for Sliding Scale Insulin  Orders   Intake and Output   Do not place and if present remove PureWick   Initiate Oral Care Protocol   Initiate Carrier Fluid Protocol   RN may order General Admission PRN Orders utilizing "General Admission PRN medications" (through manage orders) for the following patient needs: allergy symptoms (Claritin), cold sores (Carmex), cough (Robitussin DM), eye irritation (Liquifilm Tears), hemorrhoids (Tucks), indigestion (Maalox), minor skin irritation (Hydrocortisone Cream), muscle pain Lovena Rubinstein Gay), nose irritation (saline nasal spray) and sore throat (Chloraseptic spray).   Apply Diabetes Mellitus Care Plan   STAT CBG when hypoglycemia is suspected. If treated, recheck every 15 minutes after each treatment until CBG >/= 70 mg/dl   Refer to Hypoglycemia Protocol Sidebar Report for treatment of CBG < 70 mg/dl   No HS correction Insulin    Full code   Consult for Unassigned Medical  Admission   Pulse oximetry check with vital signs   Oxygen therapy Mode or (Route): Nasal cannula; Liters Per Minute: 2; Keep O2 saturation between: greater than 92 %   Incentive spirometry   I-stat chem 8, ED (not at [facility], [facility] or [facility])   CBG monitoring, ED   EKG 12-Lead   EKG 12-Lead   Saline lock IV   Place in observation (patient's expected length of stay will be less than 2 midnights)   Aspiration precautions   Fall precautions     Author: Lavanda Porter, MD  [facility] 11/22/2023 4:05 AM >>Please note for any concern,or critical results please contact [facility] PROVIDER assigned.

## 2023-11-22 NOTE — Progress Notes (Signed)
 Sarah [name] is a 68 y.o. female patient admitted. Awake, alert - oriented  X 4 - no acute distress noted.  VSS - Blood pressure 120/62, pulse 75, temperature 98.1 F (36.7 C), temperature source Oral, resp. rate 16, height 5\' 8"  (1.727 m), weight 92.1 kg, SpO2 90%.    IV in place, occlusive dsg intact without redness.  Orientation to room, and floor completed.  Admission INP armband ID verified with patient and in place.  SR up x 2, fall assessment complete, with patient and family able to verbalize understanding of risk associated with falls, and verbalized understanding to call nsg before up out of bed.  Call light within reach, patient able to voice, and demonstrate understanding. No evidence of skin break down noted on exam.      Cathay Clonts, RN 11/22/2023

## 2023-11-22 NOTE — Progress Notes (Signed)
   11/22/23 1024  SDOH Interventions  Transportation Interventions Inpatient TOC   Resources placed on AVS

## 2023-11-22 NOTE — Progress Notes (Signed)
   Sarah [name]  MVH:[id number] DOB: 1955-12-25 DOA: 11/21/2023 PCP: Olene Berne, MD    Brief Narrative:  68 year old with a history of COPD ongoing tobacco abuse, prior episodes of alcohol  associated pancreatitis, alcohol  abuse, DVT/PE, HTN, and DM2 who presented to the ER 11/21/2023 with a week of generalized unrelenting abdominal pain.  Her lipase was 96 with CT abdomen noting peripancreatic stranding consistent with acute pancreatitis.  Goals of Care:   Code Status: Full Code   DVT prophylaxis: SCDs Start: 11/22/23 0258   Interim Hx: The patient was interviewed and examined by one of my partners earlier today.  She has been afebrile since admission with stable vital signs.  Assessment & Plan:  Acute pancreatitis Triglycerides not markedly elevated at 133 -rapidly improving -is very hungry -give trial of low-fat diet  COPD with ongoing tobacco abuse Quiescent at present  History of alcohol  abuse Counseled on absolute need to avoid alcohol  entirely  DM2 A1c 7.0 -CBG controlled at present  History of DVT/PE   Family Communication: No family present at time of follow-up Disposition: Anticipate discharge home, likely 6/7   Objective: Blood pressure 120/62, pulse 75, temperature 98.1 F (36.7 C), temperature source Oral, resp. rate 16, height 5\' 8"  (1.727 m), weight 92.1 kg, SpO2 90%. No intake or output data in the 24 hours ending 11/22/23 0914 Filed Weights   11/21/23 1425  Weight: 92.1 kg    Examination: The patient was examined by one of my partners earlier today  CBC: Recent Labs  Lab 11/21/23 1429 11/21/23 1445  WBC 5.3  --   HGB 14.7 15.3*  HCT 44.5 45.0  MCV 81.8  --   PLT 307  --    Basic Metabolic Panel: Recent Labs  Lab 11/21/23 1429 11/21/23 1445  NA 137 139  K 3.8 3.9  CL 101 100  CO2 27  --   GLUCOSE 194* 192*  BUN 6* 6*  CREATININE 0.85 0.90  CALCIUM 9.7  --    GFR: Estimated Creatinine Clearance: 69 mL/min (by C-G formula  based on SCr of 0.9 mg/dL).   Scheduled Meds:  aspirin  EC  81 mg Oral Daily   pantoprazole  (PROTONIX ) IV  40 mg Intravenous Q12H   pravastatin   20 mg Oral Daily     LOS: 0 days   Abbe Abate, MD [facility] Office  [phone] Pager - Text Page per Amion  If 7PM-7AM, please contact night-coverage per Amion 11/22/2023, 9:14 AM

## 2023-11-22 NOTE — ED Provider Notes (Signed)
 [place] EMERGENCY DEPARTMENT AT [facility] Provider Note   CSN: [id number] Arrival date & time: 11/21/23  1354     History  Chief Complaint  Patient presents with   Abdominal Pain    Sarah [name] is a 68 y.o. female.  Patient with a history of diabetes and peripheral neuropathy here with diffuse abdominal pain for the past 1 week.  Symptoms urgent care for same.  Reports pain is constant and starts in the center of her abdomen and radiates diffusely.  Thought she was constipated but has been taking laxatives at home with last bowel movement 2 days ago.  Denies any black or bloody stools.  Nausea but no vomiting.  Pain with attempting to eat is worse.  No chest pain or shortness of breath.  No fever.  No pain with urination or blood in the urine.  Still has appendix and gallbladder.  Denies any significant alcohol  use.  She is never had this kind of pain in the past.  Denies history of pancreatitis though this is listed in her past medical history  The history is provided by the patient.  Abdominal Pain Associated symptoms: constipation, nausea and vomiting   Associated symptoms: no chest pain, no cough, no dysuria, no fever, no hematuria and no shortness of breath        Home Medications Prior to Admission medications   Medication Sig Start Date End Date Taking? Authorizing Provider  acetaminophen  (TYLENOL ) 500 MG tablet Take 1,000 mg by mouth every 6 (six) hours as needed for moderate pain or mild pain.    [provider]  acetaminophen  (TYLENOL ) 650 MG CR tablet Take 1,300 mg by mouth every 8 (eight) hours as needed for pain.    [provider]  albuterol  (ACCUNEB ) 1.25 MG/3ML nebulizer solution Take 1 ampule by nebulization 3 (three) times daily as needed for wheezing or shortness of breath. 07/14/21   [provider]  albuterol  (PROAIR  HFA) 108 (90 Base) MCG/ACT inhaler Inhale 2 puffs into the lungs every 4 (four) hours as needed for  wheezing or shortness of breath. 11/25/20   Lavaughn Portland, MD  amLODipine  (NORVASC ) 10 MG tablet Take 1 tablet (10 mg total) by mouth daily. 12/21/17   Lawrance Presume, MD  ammonium lactate (AMLACTIN) 12 % cream Apply 1 Application topically as needed for dry skin. 05/04/22   [provider]  aspirin  EC 81 MG tablet Take 1 tablet (81 mg total) by mouth daily. 07/02/16   Marlan Silva, MD  ATROVENT  HFA 17 MCG/ACT inhaler Inhale 2 puffs into the lungs every 6 (six) hours as needed for wheezing. 07/14/21   [provider]  blood glucose meter kit and supplies KIT Libra Freestyle meter 03/14/17   Lawrance Presume, MD  Blood Glucose Monitoring Suppl (ONE TOUCH ULTRA MINI) w/Device KIT Use as directed to test blood sugar once daily. DX code E11.9 03/12/18   Lawrance Presume, MD  brimonidine-timolol (COMBIGAN) 0.2-0.5 % ophthalmic solution Apply 1 drop to eye 2 (two) times daily. 04/25/21   [provider]  budesonide -formoterol  (SYMBICORT ) 160-4.5 MCG/ACT inhaler Inhale 2 puffs into the lungs 2 (two) times daily.    [provider]  cyclobenzaprine  (FLEXERIL ) 5 MG tablet Take 5 mg by mouth 3 (three) times daily as needed for muscle spasms. 04/27/19   [provider]  diclofenac Sodium (VOLTAREN) 1 % GEL Apply 2 g topically 4 (four) times daily. 06/27/22   [provider]  escitalopram  (LEXAPRO ) 10 MG tablet Take 1 tablet (10 mg total) by mouth daily. 11/25/20   Lavaughn Portland, MD  fluticasone  (FLONASE ) 50 MCG/ACT nasal spray Place 2 sprays into both nostrils daily. 11/25/20   Lavaughn Portland, MD  gabapentin  (NEURONTIN ) 300 MG capsule Take 300 mg by mouth 2 (two) times daily. 10/26/22   [provider]  glimepiride  (AMARYL ) 1 MG tablet Take 1 tablet (1 mg total) by mouth 2 (two) times daily. 11/25/20   Lavaughn Portland, MD  glipiZIDE (GLUCOTROL) 10 MG tablet Take 10 mg by mouth 2 (two) times daily. 07/31/21   [provider]  glucose blood (ONE TOUCH ULTRA TEST)  test strip Use as directed to test blood sugar once daily. DX code E11.9 03/12/18   Lawrance Presume, MD  hydrochlorothiazide  (HYDRODIURIL ) 25 MG tablet Take 1 tablet (25 mg total) by mouth daily. 10/28/17   Lawrance Presume, MD  lisinopril  (PRINIVIL ,ZESTRIL ) 5 MG tablet Take 1 tablet (5 mg total) by mouth every morning. 11/19/17   Lawrance Presume, MD  lubiprostone  (AMITIZA ) 24 MCG capsule Take 24 mcg by mouth 2 (two) times daily with a meal. Patient only takes it one time a day as two times a day is to strong for her    [provider]  LUMIGAN  0.01 % SOLN Place 1 drop into both eyes at bedtime. 11/25/20   Lavaughn Portland, MD  metFORMIN  (GLUCOPHAGE ) 1000 MG tablet Take 1,000 mg by mouth 2 (two) times daily with a meal.    [provider]  methocarbamol (ROBAXIN) 500 MG tablet Take 500 mg by mouth 2 (two) times daily. 11/02/22   [provider]  omeprazole (PRILOSEC) 20 MG capsule Take 20 mg by mouth daily. 05/12/21   [provider]  Raeford Bullion LANCETS 33G MISC Use as directed to test blood sugar once daily. DX code E11.9 03/12/18   Lawrance Presume, MD  pravastatin  (PRAVACHOL ) 20 MG tablet Take 1 tablet (20 mg total) by mouth daily. 08/12/17   Lawrance Presume, MD  sodium chloride  (OCEAN) 0.65 % SOLN nasal spray Place 1 spray into both nostrils 2 (two) times daily. 11/25/20   Lavaughn Portland, MD  trimethoprim-polymyxin b (POLYTRIM) ophthalmic solution Place 1 drop into both eyes 4 (four) times daily. 07/10/22   [provider]      Allergies    Aspirin , Ibuprofen, Other, and Penicillins    Review of Systems   Review of Systems  Constitutional:  Positive for activity change and appetite change. Negative for fever.  HENT:  Negative for congestion and rhinorrhea.   Respiratory:  Negative for cough, chest tightness and shortness of breath.   Cardiovascular:  Negative for chest pain.  Gastrointestinal:  Positive for abdominal pain, constipation, nausea and  vomiting.  Genitourinary:  Negative for dysuria and hematuria.  Musculoskeletal:  Negative for arthralgias and myalgias.  Skin:  Negative for rash.  Neurological:  Negative for dizziness, weakness and headaches.   all other systems are negative except as noted in the HPI and PMH.    Physical Exam Updated Vital Signs BP 137/84   Pulse 92   Temp 98 F (36.7 C) (Oral)   Resp (!) 22   Ht 5\' 8"  (1.727 m)   Wt 92.1 kg   SpO2 100%   BMI 30.87 kg/m  Physical Exam Vitals and nursing note reviewed.  Constitutional:      General: She is not in acute distress.    Appearance: She is well-developed.  HENT:     Head: Normocephalic and atraumatic.     Mouth/Throat:     Pharynx: No oropharyngeal exudate.  Eyes:     Conjunctiva/sclera: Conjunctivae normal.     Pupils: Pupils are equal, round, and reactive to light.  Neck:     Comments: No meningismus. Cardiovascular:     Rate and Rhythm: Normal rate and regular rhythm.     Heart sounds: Normal heart sounds. No murmur heard. Pulmonary:     Effort: Pulmonary effort is normal. No respiratory distress.     Breath sounds: Normal breath sounds.  Abdominal:     Palpations: Abdomen is soft.     Tenderness: There is abdominal tenderness. There is no guarding or rebound.     Comments: Diffusely tender, worse in the epigastrium, no guarding or rebound  Musculoskeletal:        General: No tenderness. Normal range of motion.     Cervical back: Normal range of motion and neck supple.  Skin:    General: Skin is warm.  Neurological:     Mental Status: She is alert and oriented to person, place, and time.     Cranial Nerves: No cranial nerve deficit.     Motor: No abnormal muscle tone.     Coordination: Coordination normal.     Comments:  5/5 strength throughout. CN 2-12 intact.Equal grip strength.   Psychiatric:        Behavior: Behavior normal.     ED Results / Procedures / Treatments   Labs (all labs ordered are listed, but only  abnormal results are displayed) Labs Reviewed  LIPASE, BLOOD - Abnormal; Notable for the following components:      Result Value   Lipase 96 (*)    All other components within normal limits  COMPREHENSIVE METABOLIC PANEL WITH GFR - Abnormal; Notable for the following components:   Glucose, Bld 194 (*)    BUN 6 (*)    AST 14 (*)    All other components within normal limits  CBC - Abnormal; Notable for the following components:   RBC 5.44 (*)    All other components within normal limits  I-STAT CHEM 8, ED - Abnormal; Notable for the following components:   BUN 6 (*)    Glucose, Bld 192 (*)    Hemoglobin 15.3 (*)    All other components within normal limits  CBG MONITORING, ED - Abnormal; Notable for the following components:   Glucose-Capillary 170 (*)    All other components within normal limits  URINALYSIS, ROUTINE W REFLEX MICROSCOPIC  TROPONIN I (HIGH SENSITIVITY)    EKG EKG Interpretation Date/Time:  Friday November 22 2023 01:04:14 EDT Ventricular Rate:  76 PR Interval:  149 QRS Duration:  86 QT Interval:  403 QTC Calculation: 454 R Axis:   7  Text Interpretation: Sinus rhythm RSR' in V1 or V2, probably normal variant No significant change was found Confirmed by Earma Gloss [phone]) on 11/22/2023 1:08:21 AM  Radiology CT ABDOMEN PELVIS W CONTRAST Result Date: 11/21/2023 CLINICAL DATA:  One-week history of generalized abdominal pain EXAM: CT ABDOMEN AND PELVIS WITH CONTRAST TECHNIQUE: Multidetector CT imaging of the abdomen and pelvis was performed using the standard protocol following bolus administration of intravenous contrast. RADIATION DOSE REDUCTION: This exam was performed according to the departmental dose-optimization program which includes automated exposure control, adjustment of the mA and/or kV according to patient size and/or use of iterative reconstruction technique. CONTRAST:  75mL OMNIPAQUE  IOHEXOL  350 MG/ML SOLN COMPARISON:  CT abdomen and pelvis dated  04/11/2021 FINDINGS: Lower chest: Subpleural 4 mm left lower lobe nodule (4:5), unchanged, likely benign. No specific follow-up imaging recommended. No pleural effusion or pneumothorax demonstrated. Partially imaged heart size is normal. Hepatobiliary: No focal hepatic lesions. No intra or extrahepatic biliary ductal dilation. Normal gallbladder. Pancreas: No main pancreatic ductal dilation or focal lesion. Mild peripancreatic stranding surrounding the pancreatic head and uncinate process. Spleen: Normal in size without focal abnormality. Adrenals/Urinary Tract: Unchanged 19 mm right adrenal nodule measuring 45 HU and 26 mm left adrenal nodule measuring 54 HU, likely adenomas given stability. No specific follow-up imaging recommended. No suspicious renal mass, calculi or hydronephrosis. Simple left upper pole renal cyst measuring 3.3 cm. No specific follow-up imaging recommended. Additional bilateral subcentimeter hypodensities, too small to characterize but also likely cysts. No focal bladder wall thickening. Stomach/Bowel: Normal appearance of the stomach. No evidence of bowel wall thickening, distention, or inflammatory changes. Hyperattenuating intraluminal material within the cecum and ascending colon. Normal appendix. Vascular/Lymphatic: Aortic atherosclerosis. No enlarged abdominal or pelvic lymph nodes. Reproductive: No adnexal masses. Other: No free fluid, fluid collection, or free air. Musculoskeletal: No acute or abnormal lytic or blastic osseous lesions. Multilevel degenerative changes of the partially imaged thoracic and lumbar spine. Interval development of disc extrusion at the level of L4-5 with new grade 1 anterolisthesis. IMPRESSION: 1. Mild peripancreatic stranding surrounding the pancreatic head and uncinate process, which may represent acute interstitial edematous pancreatitis. No peripancreatic fluid collection. 2. Degenerative changes of the lumbar spine with interval development of disc  extrusion at L4-5 and new grade 1 anterolisthesis. 3.  Aortic Atherosclerosis (ICD10-I70.0). Electronically Signed   By: Limin  Xu M.D.   On: 11/21/2023 18:12    Procedures Procedures    Medications Ordered in ED Medications  lactated ringers  bolus 1,000 mL (has no administration in time range)  ondansetron  (ZOFRAN ) injection 4 mg (has no administration in time range)  fentaNYL  (SUBLIMAZE ) injection 50 mcg (has no administration in time range)  iohexol  (OMNIPAQUE ) 350 MG/ML injection 75 mL (75 mLs Intravenous Contrast Given 11/21/23 1739)    ED Course/ Medical Decision Making/ A&P                                 Medical Decision Making Amount and/or Complexity of Data Reviewed Labs: ordered. Decision-making details documented in ED Course. Radiology: ordered and independent interpretation performed. Decision-making details documented in ED Course. ECG/medicine tests: ordered and independent interpretation performed. Decision-making details documented in ED Course.  Risk Prescription drug management. Decision regarding hospitalization.   1 week of diffuse abdominal pain with constipation, nausea and vomiting.  Stable vitals.  No distress.  Abdomen soft without peritoneal signs.  Will hydrate, treat pain and nausea.  Labs in triage show mild lipase elevation of 96 Hyperglycemia without DKA.  Will check EKG.  CT scan with evidence of pancreatitis, no abscess or fluid challenge  Hyperglycemia without DKA.  Patient is hydrated.  Given pain and nausea medications.  Will treat for suspected pancreatitis based on labs and CT imaging.  No evidence of abscess or drainable fluid collection.  Lipase elevated at 96 but LFTs are normal.  Given her ongoing pain and nausea we will plan observation admission for symptom control and hydration.  Discussed with Dr. Lydia Sams.        Final Clinical Impression(s) / ED Diagnoses Final diagnoses:  None    Rx / DC Orders  ED Discharge Orders      None         Reginna Sermeno, Mara [place], MD 11/22/23 [phone]

## 2023-11-22 NOTE — Plan of Care (Signed)
  Problem: Pain Managment: Goal: General experience of comfort will improve and/or be controlled Outcome: Progressing   Problem: Safety: Goal: Ability to remain free from injury will improve Outcome: Progressing

## 2023-11-23 DIAGNOSIS — K859 Acute pancreatitis without necrosis or infection, unspecified: Secondary | ICD-10-CM | POA: Diagnosis not present

## 2023-11-23 LAB — CBC
HCT: 39.3 % (ref 36.0–46.0)
Hemoglobin: 12.9 g/dL (ref 12.0–15.0)
MCH: 26.8 pg (ref 26.0–34.0)
MCHC: 32.8 g/dL (ref 30.0–36.0)
MCV: 81.5 fL (ref 80.0–100.0)
Platelets: 272 10*3/uL (ref 150–400)
RBC: 4.82 MIL/uL (ref 3.87–5.11)
RDW: 14 % (ref 11.5–15.5)
WBC: 4.4 10*3/uL (ref 4.0–10.5)
nRBC: 0 % (ref 0.0–0.2)

## 2023-11-23 LAB — GLUCOSE, CAPILLARY
Glucose-Capillary: 147 mg/dL — ABNORMAL HIGH (ref 70–99)
Glucose-Capillary: 178 mg/dL — ABNORMAL HIGH (ref 70–99)
Glucose-Capillary: 188 mg/dL — ABNORMAL HIGH (ref 70–99)
Glucose-Capillary: 228 mg/dL — ABNORMAL HIGH (ref 70–99)
Glucose-Capillary: 243 mg/dL — ABNORMAL HIGH (ref 70–99)

## 2023-11-23 LAB — COMPREHENSIVE METABOLIC PANEL WITH GFR
ALT: 12 U/L (ref 0–44)
AST: 14 U/L — ABNORMAL LOW (ref 15–41)
Albumin: 3.2 g/dL — ABNORMAL LOW (ref 3.5–5.0)
Alkaline Phosphatase: 88 U/L (ref 38–126)
Anion gap: 7 (ref 5–15)
BUN: 6 mg/dL — ABNORMAL LOW (ref 8–23)
CO2: 26 mmol/L (ref 22–32)
Calcium: 8.5 mg/dL — ABNORMAL LOW (ref 8.9–10.3)
Chloride: 101 mmol/L (ref 98–111)
Creatinine, Ser: 0.85 mg/dL (ref 0.44–1.00)
GFR, Estimated: >60 mL/min (ref >60)
Glucose, Bld: 151 mg/dL — ABNORMAL HIGH (ref 70–99)
Potassium: 3.2 mmol/L — ABNORMAL LOW (ref 3.5–5.1)
Sodium: 134 mmol/L — ABNORMAL LOW (ref 135–145)
Total Bilirubin: 0.7 mg/dL (ref 0.0–1.2)
Total Protein: 6.4 g/dL — ABNORMAL LOW (ref 6.5–8.1)

## 2023-11-23 LAB — PHOSPHORUS: Phosphorus: 4.5 mg/dL (ref 2.5–4.6)

## 2023-11-23 LAB — MAGNESIUM: Magnesium: 1.9 mg/dL (ref 1.7–2.4)

## 2023-11-23 MED ORDER — MORPHINE SULFATE (PF) 2 MG/ML IV SOLN
1.0000 mg | INTRAVENOUS | Status: AC | PRN
  Administered 2023-11-23: 1 mg via INTRAVENOUS
  Administered 2023-11-24: 2 mg via INTRAVENOUS
  Filled 2023-11-23 (×2): qty 1

## 2023-11-23 MED ORDER — METFORMIN HCL 500 MG PO TABS
1000.0000 mg | ORAL_TABLET | Freq: Every day | ORAL | Status: DC
  Administered 2023-11-24 – 2023-11-25 (×2): 1000 mg via ORAL
  Filled 2023-11-23 (×2): qty 2

## 2023-11-23 MED ORDER — POTASSIUM CHLORIDE CRYS ER 20 MEQ PO TBCR
40.0000 meq | EXTENDED_RELEASE_TABLET | Freq: Once | ORAL | Status: AC
  Administered 2023-11-23: 40 meq via ORAL
  Filled 2023-11-23: qty 2

## 2023-11-23 MED ORDER — MORPHINE SULFATE (PF) 2 MG/ML IV SOLN
1.0000 mg | INTRAVENOUS | Status: DC | PRN
  Administered 2023-11-23: 1 mg via INTRAVENOUS
  Filled 2023-11-23: qty 1

## 2023-11-23 MED ORDER — POTASSIUM CHLORIDE CRYS ER 20 MEQ PO TBCR
20.0000 meq | EXTENDED_RELEASE_TABLET | Freq: Once | ORAL | Status: AC
  Administered 2023-11-23: 20 meq via ORAL
  Filled 2023-11-23: qty 1

## 2023-11-23 NOTE — Plan of Care (Signed)
  Problem: Pain Managment: Goal: General experience of comfort will improve and/or be controlled Outcome: Progressing   Problem: Safety: Goal: Ability to remain free from injury will improve Outcome: Progressing

## 2023-11-23 NOTE — Progress Notes (Signed)
 Sarah [name]  RUE:[id number] DOB: 01-17-1956 DOA: 11/21/2023 PCP: Olene Berne, MD    Brief Narrative:  68 year old with a history of COPD, ongoing tobacco abuse, prior episodes of alcohol  associated pancreatitis, alcohol  abuse, DVT/PE, HTN, and DM2 who presented to the ER 11/21/2023 with a week of generalized unrelenting abdominal pain.  Her lipase was 96 with CT abdomen noting peripancreatic stranding consistent with acute pancreatitis.  Goals of Care:   Code Status: Full Code   DVT prophylaxis: enoxaparin  (LOVENOX ) injection 40 mg Start: 11/22/23 1000 SCDs Start: 11/22/23 0258   Interim Hx: No acute events were recorded overnight.  Afebrile.  Vital signs stable.  CBG controlled.  Tolerating minimal dietary intake but slow to advance due to some ongoing abdominal cramping.  No chest pain or shortness of breath  Assessment & Plan:  Acute pancreatitis Triglycerides not markedly elevated at 133 - tolerance of regular diet proving to be slower than initially thought -monitor overnight again -no worrisome symptoms to suggest an acute complication  Hypokalemia Due to pancreatitis and poor oral intake -supplement -magnesium is normal  COPD with ongoing tobacco abuse Quiescent  History of alcohol  abuse Counseled on absolute need to avoid alcohol  entirely  DM2 A1c 7.0 -CBG controlled at present  History of DVT/PE Asymptomatic at present  Family Communication: No family present at time of follow-up Disposition: Anticipate discharge home, likely 6/8   Objective: Blood pressure (!) 152/82, pulse 73, temperature 97.7 F (36.5 C), temperature source Oral, resp. rate 16, height 5\' 8"  (1.727 m), weight 92.1 kg, SpO2 97%.  Intake/Output Summary (Last 24 hours) at 11/23/2023 0854 Last data filed at 11/22/2023 1058 Gross per 24 hour  Intake 240 ml  Output --  Net 240 ml   Filed Weights   11/21/23 1425  Weight: 92.1 kg    Examination: General: No acute respiratory  distress Lungs: Clear to auscultation bilaterally without wheezes or crackles Cardiovascular: Regular rate and rhythm without murmur gallop or rub normal S1 and S2 Abdomen: Nontender, nondistended, soft, bowel sounds positive, no rebound, no ascites, no appreciable mass Extremities: No significant cyanosis, clubbing, or edema bilateral lower extremities   CBC: Recent Labs  Lab 11/21/23 1429 11/21/23 1445 11/23/23 0532  WBC 5.3  --  4.4  HGB 14.7 15.3* 12.9  HCT 44.5 45.0 39.3  MCV 81.8  --  81.5  PLT 307  --  272   Basic Metabolic Panel: Recent Labs  Lab 11/21/23 1429 11/21/23 1445 11/23/23 0532  NA 137 139 134*  K 3.8 3.9 3.2*  CL 101 100 101  CO2 27  --  26  GLUCOSE 194* 192* 151*  BUN 6* 6* 6*  CREATININE 0.85 0.90 0.85  CALCIUM 9.7  --  8.5*  MG  --   --  1.9  PHOS  --   --  4.5   GFR: Estimated Creatinine Clearance: 73.1 mL/min (by C-G formula based on SCr of 0.85 mg/dL).   Scheduled Meds:  aspirin  EC  81 mg Oral Daily   enoxaparin  (LOVENOX ) injection  40 mg Subcutaneous Q24H   insulin  aspart  0-9 Units Subcutaneous TID WC   nicotine   21 mg Transdermal Daily   pantoprazole  (PROTONIX ) IV  40 mg Intravenous Q12H   pravastatin   20 mg Oral Daily     LOS: 1 day   Abbe Abate, MD [facility] Office  [phone] Pager - Text Page per Tilford Foley  If 7PM-7AM, please contact night-coverage per Amion 11/23/2023, 8:54 AM

## 2023-11-23 NOTE — Plan of Care (Signed)
   Problem: Education: Goal: Knowledge of General Education information will improve Description: Including pain rating scale, medication(s)/side effects and non-pharmacologic comfort measures Outcome: Progressing   Problem: Health Behavior/Discharge Planning: Goal: Ability to manage health-related needs will improve Outcome: Progressing   Problem: Clinical Measurements: Goal: Will remain free from infection Outcome: Progressing

## 2023-11-24 DIAGNOSIS — K859 Acute pancreatitis without necrosis or infection, unspecified: Secondary | ICD-10-CM | POA: Diagnosis not present

## 2023-11-24 LAB — GLUCOSE, CAPILLARY
Glucose-Capillary: 169 mg/dL — ABNORMAL HIGH (ref 70–99)
Glucose-Capillary: 231 mg/dL — ABNORMAL HIGH (ref 70–99)
Glucose-Capillary: 152 mg/dL — ABNORMAL HIGH (ref 70–99)
Glucose-Capillary: 186 mg/dL — ABNORMAL HIGH (ref 70–99)

## 2023-11-24 LAB — BASIC METABOLIC PANEL WITH GFR
Anion gap: 9 (ref 5–15)
BUN: 7 mg/dL — ABNORMAL LOW (ref 8–23)
CO2: 27 mmol/L (ref 22–32)
Calcium: 9.2 mg/dL (ref 8.9–10.3)
Chloride: 101 mmol/L (ref 98–111)
Creatinine, Ser: 0.99 mg/dL (ref 0.44–1.00)
GFR, Estimated: >60 mL/min (ref >60)
Glucose, Bld: 167 mg/dL — ABNORMAL HIGH (ref 70–99)
Potassium: 4.1 mmol/L (ref 3.5–5.1)
Sodium: 137 mmol/L (ref 135–145)

## 2023-11-24 MED ORDER — PANTOPRAZOLE SODIUM 40 MG PO TBEC
40.0000 mg | DELAYED_RELEASE_TABLET | Freq: Two times a day (BID) | ORAL | Status: DC
  Administered 2023-11-24 – 2023-11-25 (×2): 40 mg via ORAL
  Filled 2023-11-24 (×2): qty 1

## 2023-11-24 MED ORDER — MORPHINE SULFATE (PF) 2 MG/ML IV SOLN
1.0000 mg | Freq: Once | INTRAVENOUS | Status: AC | PRN
  Administered 2023-11-24: 1 mg via INTRAVENOUS
  Filled 2023-11-24: qty 1

## 2023-11-24 NOTE — Progress Notes (Signed)
 Sarah [name]  [id number]:[id number] DOB: 07/29/1955 DOA: 11/21/2023 PCP: Olene Berne, MD    Brief Narrative:  68 year old with a history of COPD, ongoing tobacco abuse, prior episodes of alcohol  associated pancreatitis, alcohol  abuse, DVT/PE, HTN, and DM2 who presented to the ER 11/21/2023 with a week of generalized unrelenting abdominal pain.  Her lipase was 96 with CT abdomen noting peripancreatic stranding consistent with acute pancreatitis.  She has not consumed any alcohol  in years.  Goals of Care:   Code Status: Full Code   DVT prophylaxis: enoxaparin  (LOVENOX ) injection 40 mg Start: 11/22/23 1000 SCDs Start: 11/22/23 0258   Interim Hx: Afebrile.  Vital signs stable.  CBG reasonably controlled.  The patient continues to have difficulty with epigastric pain and poor tolerance of oral intake.  She denies shortness of breath nausea or vomiting.  Assessment & Plan:  Acute pancreatitis Triglycerides not markedly elevated at 133 - no worrisome symptoms to suggest an acute complication -felt to be related to use of Mounjaro -patient advised to discontinue Mounjaro -intake not yet consistent enough to allow for safe discharge home  Hypokalemia Due to pancreatitis and poor oral intake -supplemented - magnesium normal  COPD with ongoing tobacco abuse Quiescent  History of alcohol  abuse Patient confirms that she has not consumed alcohol  in well over a year  DM2 A1c 7.0 -CBG controlled at present  History of DVT/PE Asymptomatic at present  Family Communication: No family present at time of follow-up Disposition: Anticipate discharge home when oral intake more consistent with   Objective: Blood pressure (!) 145/74, pulse 73, temperature 97.7 F (36.5 C), temperature source Oral, resp. rate 18, height 5\' 8"  (1.727 m), weight 92.1 kg, SpO2 96%. No intake or output data in the 24 hours ending 11/24/23 0944  Filed Weights   11/21/23 1425  Weight: 92.1 kg     Examination: General: No acute respiratory distress Lungs: Clear to auscultation bilaterally without wheezes or crackles Cardiovascular: Regular rate and rhythm without murmur gallop or rub normal S1 and S2 Abdomen: Mildly tender to palpation in epigastrium, bowel sounds hypoactive, no rebound, no appreciable mass Extremities: No significant cyanosis, clubbing, or edema bilateral lower extremities   CBC: Recent Labs  Lab 11/21/23 1429 11/21/23 1445 11/23/23 0532  WBC 5.3  --  4.4  HGB 14.7 15.3* 12.9  HCT 44.5 45.0 39.3  MCV 81.8  --  81.5  PLT 307  --  272   Basic Metabolic Panel: Recent Labs  Lab 11/21/23 1429 11/21/23 1445 11/23/23 0532 11/24/23 0440  NA 137 139 134* 137  K 3.8 3.9 3.2* 4.1  CL 101 100 101 101  CO2 27  --  26 27  GLUCOSE 194* 192* 151* 167*  BUN 6* 6* 6* 7*  CREATININE 0.85 0.90 0.85 0.99  CALCIUM 9.7  --  8.5* 9.2  MG  --   --  1.9  --   PHOS  --   --  4.5  --    GFR: Estimated Creatinine Clearance: 62.8 mL/min (by C-G formula based on SCr of 0.99 mg/dL).   Scheduled Meds:  aspirin  EC  81 mg Oral Daily   enoxaparin  (LOVENOX ) injection  40 mg Subcutaneous Q24H   insulin  aspart  0-9 Units Subcutaneous TID WC   metFORMIN   1,000 mg Oral Q breakfast   nicotine   21 mg Transdermal Daily   pantoprazole  (PROTONIX ) IV  40 mg Intravenous Q12H   pravastatin   20 mg Oral Daily     LOS:  2 days   Abbe Abate, MD [facility] Office  [phone] Pager - Text Page per Amion  If 7PM-7AM, please contact night-coverage per Amion 11/24/2023, 9:44 AM

## 2023-11-24 NOTE — Plan of Care (Signed)

## 2023-11-25 ENCOUNTER — Other Ambulatory Visit (HOSPITAL_COMMUNITY): Payer: Self-pay

## 2023-11-25 DIAGNOSIS — K853 Drug induced acute pancreatitis without necrosis or infection: Secondary | ICD-10-CM | POA: Diagnosis not present

## 2023-11-25 LAB — GLUCOSE, CAPILLARY
Glucose-Capillary: 151 mg/dL — ABNORMAL HIGH (ref 70–99)
Glucose-Capillary: 213 mg/dL — ABNORMAL HIGH (ref 70–99)

## 2023-11-25 MED ORDER — MORPHINE SULFATE (PF) 2 MG/ML IV SOLN
1.0000 mg | Freq: Once | INTRAVENOUS | Status: DC | PRN
  Filled 2023-11-25: qty 1

## 2023-11-25 MED ORDER — PANTOPRAZOLE SODIUM 40 MG PO TBEC
40.0000 mg | DELAYED_RELEASE_TABLET | Freq: Two times a day (BID) | ORAL | 0 refills | Status: AC
  Filled 2023-11-25: qty 20, 10d supply, fill #0

## 2023-11-25 MED ORDER — HYDROCODONE-ACETAMINOPHEN 5-325 MG PO TABS
1.0000 | ORAL_TABLET | Freq: Four times a day (QID) | ORAL | 0 refills | Status: AC | PRN
  Filled 2023-11-25: qty 25, 7d supply, fill #0

## 2023-11-25 MED ORDER — POLYETHYLENE GLYCOL 3350 17 G PO PACK
17.0000 g | PACK | Freq: Two times a day (BID) | ORAL | Status: DC
  Administered 2023-11-25: 17 g via ORAL
  Filled 2023-11-25: qty 1

## 2023-11-25 MED ORDER — KETOROLAC TROMETHAMINE 15 MG/ML IJ SOLN: 15.0000 mg | Freq: Three times a day (TID) | INTRAMUSCULAR | Status: DC | PRN

## 2023-11-25 MED ORDER — SENNOSIDES-DOCUSATE SODIUM 8.6-50 MG PO TABS
1.0000 | ORAL_TABLET | Freq: Two times a day (BID) | ORAL | Status: DC
  Administered 2023-11-25: 1 via ORAL
  Filled 2023-11-25: qty 1

## 2023-11-25 NOTE — Plan of Care (Signed)

## 2023-11-25 NOTE — Progress Notes (Signed)
 Mobility Specialist Progress Note:    11/25/23 1040  Mobility  Activity Ambulated with assistance in hallway  Level of Assistance Contact guard assist, steadying assist  Assistive Device Four wheel walker  Distance Ambulated (ft) 400 ft  Activity Response Tolerated well  Mobility Referral Yes  Mobility visit 1 Mobility  Mobility Specialist Start Time (ACUTE ONLY) 0945  Mobility Specialist Stop Time (ACUTE ONLY) 1000  Mobility Specialist Time Calculation (min) (ACUTE ONLY) 15 min   Pt received seated EOB, agreeable to mobility. C/o abdominal and hip pain. During ambulation pt took x1 seated rest break d/t fatigue. Once returned to room, pt had a moment of dizziness cause LOB, corrected with MinA for MS. Returned to seated EOB. Took BP (see below). Left seated EOB w/ call bell and personal belongings in reach. All needs met.  Post Mobility BP 153/65 (89)  Inetta Manes Mobility Specialist  Please contact vis Secure Chat or  Rehab Office [phone]

## 2023-11-25 NOTE — Progress Notes (Signed)
 Pt requested pain medication but refused oral pain meds. She wanted Morphine  but the original order had been completed. Sent provider a message requesting an order and informing him that she refused oral pain meds and wants morphine  only at this time. Pt stated the oral pain meds made her " feel like she was going to jump out of the window". Provider placed a one time order at that time. At 0324 she requested more pain meds and I sent another message to the provider requesting another order. He stated that would be the last order for tonight and that the attending would place a new order for something else this am.

## 2023-11-25 NOTE — Care Management Important Message (Signed)
 Important Message  Patient Details  Name: Sarah [name] MRN: [id number] Date of Birth: 03/18/1956   Important Message Given:  Yes - Medicare IM     Felix Host 11/25/2023, 11:37 AM

## 2023-11-25 NOTE — Discharge Summary (Signed)
 DISCHARGE SUMMARY  Sarah [name]  MR#: [id number]  DOB:1955-11-01  Date of Admission: 11/21/2023 Date of Discharge: 11/25/2023  Attending Physician:Azahel Belcastro Constantine Delude, MD  Patient's WGN:FAOZHY Loring Ropes, MD  Disposition: D/C home   Follow-up Appts:  Follow-up Information     Alica Antu, Billie Budge, MD Follow up in 7 day(s).   Specialty: Family Medicine Contact information: [address] [place] [address] [phone]                 Discharge Diagnoses: Acute pancreatitis Hypokalemia COPD with ongoing tobacco abuse History of remote alcohol  abuse DM2 History of DVT/PE  Initial presentation: 68 year old with a history of COPD, ongoing tobacco abuse, prior episodes of alcohol  associated pancreatitis, remote alcohol  abuse, DVT/PE, HTN, and DM2 who presented to the ER 11/21/2023 with a week of generalized unrelenting abdominal pain. Her lipase was 96 with CT abdomen noting peripancreatic stranding consistent with acute pancreatitis. She has not consumed any alcohol  in many years.   Hospital Course:  Acute pancreatitis Triglycerides not markedly elevated at 133 - no worrisome symptoms to suggest an acute complication -felt to be related to use of Mounjaro -patient advised to discontinue Mounjaro -she is tolerating modest oral intake at discharge with pain being reasonably controlled -she is advised to very slowly advance her diet at home and to avoid heavy fatty foods for the first 3-5 days   Hypokalemia Due to pancreatitis and poor oral intake - supplemented - magnesium normal   COPD with ongoing tobacco abuse Quiescent   History of alcohol  abuse Patient confirms that she has not consumed alcohol  in many years -she has been advised that resumption of alcohol  intake at present would rapidly lead to severe worsening of her pancreatitis   DM2 A1c 7.0 -CBG controlled at present   History of DVT/PE Asymptomatic at present  Allergies as of  11/25/2023       Reactions   Aspirin  Other (See Comments)   Causes her stomach pain.   Ibuprofen Other (See Comments)   Causes stomach pain   Penicillins Other (See Comments)   Localized whelps and redness after injection Has patient had a PCN reaction causing immediate rash, facial/tongue/throat swelling, SOB or lightheadedness with hypotension: No Has patient had a PCN reaction causing severe rash involving mucus membranes or skin necrosis: Yes Has patient had a PCN reaction that required hospitalization No Has patient had a PCN reaction occurring within the last 10 years: No If all of the above answers are "NO", then may proceed with Cephalosporin use.        Medication List     STOP taking these medications    acetaminophen  500 MG tablet Commonly known as: TYLENOL    Mounjaro 5 MG/0.5ML Pen Generic drug: tirzepatide       TAKE these medications    albuterol  108 (90 Base) MCG/ACT inhaler Commonly known as: ProAir  HFA Inhale 2 puffs into the lungs every 4 (four) hours as needed for wheezing or shortness of breath.   ammonium lactate 12 % cream Commonly known as: AMLACTIN Apply 1 Application topically as needed for dry skin.   aspirin  EC 81 MG tablet Take 1 tablet (81 mg total) by mouth daily.   blood glucose meter kit and supplies Kit Libra Freestyle meter   brimonidine-timolol 0.2-0.5 % ophthalmic solution Commonly known as: COMBIGAN Apply 1 drop to eye 2 (two) times daily.   clotrimazole-betamethasone cream Commonly known as: LOTRISONE Apply 1 Application topically 2 (two) times daily as  needed (rash).   cyclobenzaprine  5 MG tablet Commonly known as: FLEXERIL  Take 5 mg by mouth 2 (two) times daily as needed for muscle spasms.   escitalopram  10 MG tablet Commonly known as: LEXAPRO  Take 1 tablet (10 mg total) by mouth daily.   fluticasone  50 MCG/ACT nasal spray Commonly known as: FLONASE  Place 2 sprays into both nostrils daily.   gabapentin  300 MG  capsule Commonly known as: NEURONTIN  Take 300 mg by mouth 2 (two) times daily.   glipiZIDE 10 MG tablet Commonly known as: GLUCOTROL Take 10 mg by mouth 2 (two) times daily.   glucose blood test strip Commonly known as: ONE TOUCH ULTRA TEST Use as directed to test blood sugar once daily. DX code E11.9   hydrochlorothiazide  25 MG tablet Commonly known as: HYDRODIURIL  Take 1 tablet (25 mg total) by mouth daily.   HYDROcodone -acetaminophen  5-325 MG tablet Commonly known as: NORCO/VICODIN Take 1 tablet by mouth every 6 (six) hours as needed for severe pain (pain score 7-10).   ipratropium-albuterol  0.5-2.5 (3) MG/3ML Soln Commonly known as: DUONEB Take 3 mLs by nebulization every 6 (six) hours as needed (shortness of breath).   lisinopril  5 MG tablet Commonly known as: ZESTRIL  Take 1 tablet (5 mg total) by mouth every morning.   lubiprostone  24 MCG capsule Commonly known as: AMITIZA  Take 24 mcg by mouth 2 (two) times daily with a meal. Patient only takes it one time a day as two times a day is to strong for her   Lumigan  0.01 % Soln Generic drug: bimatoprost  Place 1 drop into both eyes at bedtime.   metFORMIN  1000 MG tablet Commonly known as: GLUCOPHAGE  Take 1,000 mg by mouth daily with breakfast.   methocarbamol 500 MG tablet Commonly known as: ROBAXIN Take 500 mg by mouth 2 (two) times daily.   omeprazole 40 MG capsule Commonly known as: PRILOSEC Take 40 mg by mouth daily.   ONE TOUCH ULTRA MINI w/Device Kit Use as directed to test blood sugar once daily. DX code E11.9   OneTouch Delica Lancets 33G Misc Use as directed to test blood sugar once daily. DX code E11.9   pantoprazole  40 MG tablet Commonly known as: PROTONIX  Take 1 tablet (40 mg total) by mouth 2 (two) times daily.   pravastatin  20 MG tablet Commonly known as: PRAVACHOL  Take 1 tablet (20 mg total) by mouth daily.   sodium chloride  0.65 % Soln nasal spray Commonly known as: OCEAN Place 1 spray  into both nostrils 2 (two) times daily.        Day of Discharge BP (!) 146/73 (BP Location: Left Arm)   Pulse 71   Temp 97.8 F (36.6 C) (Oral)   Resp 18   Ht 5\' 8"  (1.727 m)   Wt 92.1 kg   SpO2 98%   BMI 30.87 kg/m   Physical Exam: General: No acute respiratory distress Lungs: Clear to auscultation bilaterally without wheezes or crackles Cardiovascular: Regular rate and rhythm without murmur gallop or rub normal S1 and S2 Abdomen: Nontender, nondistended, soft, bowel sounds positive, no rebound, no ascites, no appreciable mass Extremities: No significant cyanosis, clubbing, or edema bilateral lower extremities  Basic Metabolic Panel: Recent Labs  Lab 11/21/23 1429 11/21/23 1445 11/23/23 0532 11/24/23 0440  NA 137 139 134* 137  K 3.8 3.9 3.2* 4.1  CL 101 100 101 101  CO2 27  --  26 27  GLUCOSE 194* 192* 151* 167*  BUN 6* 6* 6* 7*  CREATININE 0.85 0.90  0.85 0.99  CALCIUM 9.7  --  8.5* 9.2  MG  --   --  1.9  --   PHOS  --   --  4.5  --     CBC: Recent Labs  Lab 11/21/23 1429 11/21/23 1445 11/23/23 0532  WBC 5.3  --  4.4  HGB 14.7 15.3* 12.9  HCT 44.5 45.0 39.3  MCV 81.8  --  81.5  PLT 307  --  272    Time spent in discharge (includes decision making & examination of pt): 35 minutes  11/25/2023, 11:58 AM   Abbe Abate, MD [facility] Office  [phone]

## 2023-11-25 NOTE — Progress Notes (Signed)
 Francee Inch to be D/C'd  per MD order.  Discussed with the patient and all questions fully answered.  VSS, Skin clean, dry and intact without evidence of skin break down, no evidence of skin tears noted.  IV catheter discontinued intact. Site without signs and symptoms of complications. Dressing and pressure applied.  An After Visit Summary was printed and given to the patient. Patient prescription is ready for pick up at [facility] pharmacy.  D/c education completed with patient/family including follow up instructions, medication list, d/c activities limitations if indicated, with other d/c instructions as indicated by MD - patient able to verbalize understanding, all questions fully answered.   Patient instructed to return to ED, call 911, or call MD for any changes in condition.   Patient to be escorted via WC. Going to the discharge lounge.

## 2023-11-27 ENCOUNTER — Inpatient Hospital Stay: Admission: RE | Admit: 2023-11-27 | Source: Ambulatory Visit

## 2023-11-28 ENCOUNTER — Ambulatory Visit: Admitting: Podiatry

## 2023-12-10 ENCOUNTER — Ambulatory Visit

## 2024-01-02 ENCOUNTER — Ambulatory Visit

## 2024-02-28 ENCOUNTER — Ambulatory Visit (INDEPENDENT_AMBULATORY_CARE_PROVIDER_SITE_OTHER): Admitting: Podiatry

## 2024-02-28 ENCOUNTER — Encounter: Payer: Self-pay | Admitting: Podiatry

## 2024-02-28 DIAGNOSIS — E1140 Type 2 diabetes mellitus with diabetic neuropathy, unspecified: Secondary | ICD-10-CM | POA: Diagnosis not present

## 2024-02-28 DIAGNOSIS — M79674 Pain in right toe(s): Secondary | ICD-10-CM | POA: Diagnosis not present

## 2024-02-28 DIAGNOSIS — B351 Tinea unguium: Secondary | ICD-10-CM | POA: Diagnosis not present

## 2024-02-28 DIAGNOSIS — M79675 Pain in left toe(s): Secondary | ICD-10-CM

## 2024-02-28 MED ORDER — CICLOPIROX 8 % EX SOLN: Freq: Every day | CUTANEOUS | 12 refills | Status: AC

## 2024-02-28 NOTE — Patient Instructions (Signed)
 Apply the Penlac  (antifungal medication) to the toenails every night making sure that you paint the whole surface of the nail with a light coat of the medicine.  Once a week he will need to wash the medicine off with rubbing alcohol  or nail polish remover.  Look for urea 40-47% nail gel cream or ointment and apply to the thickened toenails. This can be bought over the counter, at a pharmacy or online such as [employer].  You can do this 1-2 times a day.  Recommend doing this in the morning so that it does not interfere with applying the Penlac  later in the evening.

## 2024-02-28 NOTE — Progress Notes (Signed)
  Subjective:  Patient ID: Sarah [name], female    DOB: June 10, 1956,  MRN: [id number]  Chief Complaint  Patient presents with   Diabetes    [facility] nail trim.  Nails long and darkened.  A1c 7.0 no anti coag    68 y.o. female presents with the above complaint. History confirmed with patient. Patient presenting with pain related to dystrophic thickened elongated nails. Patient is unable to trim own nails related to nail dystrophy and/or mobility issues. Patient does  have a history of T2DM. Doing better with midfoot pain.  Wanting to discuss possible antifungal treatment for the nails.  Objective:  Physical Exam: warm, good capillary refill nail exam onychomycosis of the toenails DP pulses faintly palpable, PT pulses faintly palpable, and protective sensation intact Left Foot:  Pain with palpation of nails due to elongation and dystrophic growth.  Right Foot: Pain with palpation of nails due to elongation and dystrophic growth.    Assessment:   1. Pain due to onychomycosis of toenails of both feet   2. Type 2 diabetes mellitus with diabetic neuropathy, unspecified whether long term insulin  use (HCC)      Plan:  Patient was evaluated and treated and all questions answered.  #Onychomycosis with pain  -Nails palliatively debrided as below. -Educated on self-care - Want to discuss further treatment for onychomycosis - Nail specimen obtained from right first toenail to be sent for fungal pathology - Will start topical Penlac  in the interim.  Favor this over oral medication.  Discussed proper application instructions with patient - Medication management separate from procedure of nail debridement.  Procedure: Nail Debridement Rationale: Pain Type of Debridement: manual, sharp debridement. Instrumentation: Nail nipper, rotary burr. Number of Nails: 10   Follow-up in 3 months for diabetic footcare        Shamonica Schadt L. Lamount, DPM [facility] / [facility]

## 2024-03-04 ENCOUNTER — Other Ambulatory Visit: Payer: Self-pay | Admitting: Podiatry

## 2024-03-06 ENCOUNTER — Ambulatory Visit: Payer: Self-pay | Admitting: Podiatry

## 2024-05-07 ENCOUNTER — Inpatient Hospital Stay (HOSPITAL_BASED_OUTPATIENT_CLINIC_OR_DEPARTMENT_OTHER): Admission: RE | Admit: 2024-05-07 | Source: Ambulatory Visit

## 2024-06-04 ENCOUNTER — Ambulatory Visit: Admitting: Podiatry

## 2024-06-29 ENCOUNTER — Ambulatory Visit: Admitting: Podiatry

## 2024-06-29 DIAGNOSIS — B351 Tinea unguium: Secondary | ICD-10-CM | POA: Diagnosis not present

## 2024-06-29 DIAGNOSIS — E1140 Type 2 diabetes mellitus with diabetic neuropathy, unspecified: Secondary | ICD-10-CM | POA: Diagnosis not present

## 2024-06-29 DIAGNOSIS — M19071 Primary osteoarthritis, right ankle and foot: Secondary | ICD-10-CM

## 2024-06-29 DIAGNOSIS — M79675 Pain in left toe(s): Secondary | ICD-10-CM

## 2024-06-29 DIAGNOSIS — L84 Corns and callosities: Secondary | ICD-10-CM | POA: Diagnosis not present

## 2024-06-29 DIAGNOSIS — M2141 Flat foot [pes planus] (acquired), right foot: Secondary | ICD-10-CM

## 2024-06-29 DIAGNOSIS — M2142 Flat foot [pes planus] (acquired), left foot: Secondary | ICD-10-CM

## 2024-06-29 DIAGNOSIS — M79674 Pain in right toe(s): Secondary | ICD-10-CM | POA: Diagnosis not present

## 2024-06-29 MED ORDER — TRIAMCINOLONE ACETONIDE 10 MG/ML IJ SUSP: 5.0000 mg | Freq: Once | INTRAMUSCULAR | Status: DC

## 2024-06-29 MED ORDER — TRIAMCINOLONE ACETONIDE 10 MG/ML IJ SUSP
5.0000 mg | Freq: Once | INTRAMUSCULAR | Status: AC
  Administered 2024-06-29: 5 mg

## 2024-06-29 NOTE — Progress Notes (Signed)
 "  Subjective:  Patient ID: Sarah [name], female    DOB: [birth year],  MRN: [id number]  Chief Complaint  Patient presents with   [facility]    [facility]. Pt requesting injection and diabetic shoes  A1c 7.0. No anti coag    69 y.o. female presents with the above complaint. History confirmed with patient. Patient presenting with pain related to dystrophic thickened elongated nails. Patient is unable to trim own nails related to nail dystrophy and mobility issues. Patient does  have a history of T2DM last A1c 7.0.  She does report callus present plantar lateral left heel.  Complaining of bilateral dorsal midfoot pain right greater than left.  She is requesting an injection for the right foot today.  Also requesting diabetic shoes.  Objective:  Physical Exam: warm, good capillary refill, decreased pedal hair growth, pedal skin atrophic nail exam onychomycosis of the toenails with thickening greater than 3 mm, dystrophic changes, brittle appearance and subungual debris. DP pulses faintly palpable, PT pulses faintly palpable, and protective sensation intact, subjective paresthesias noted.  Varicosities noted medial ankles and midfoot. Left Foot:  Pain with palpation of nails due to elongation and dystrophic growth.  Pain on palpation of dorsal midfoot.  Palpable osseous prominence noted.  Hyperkeratotic lesion plantar lateral left heel Right Foot: Pain with palpation of nails due to elongation and dystrophic growth. Pain in right dorsal midfoot joints, palpable osseous prominence is noted.  Pes planus foot type noted bilaterally.  Assessment:   1. Pain due to onychomycosis of toenails of both feet   2. Type 2 diabetes mellitus with diabetic neuropathy, unspecified whether long term insulin  use (HCC)   3. Callus   4. Bilateral pes planus   5. Arthritis of right midfoot      Plan:  Patient was evaluated and treated and all questions answered.  # Painful callus plantar lateral left heel All symptomatic  hyperkeratoses x1 were safely debrided with a sterile #15 blade to patient's level of comfort without incident. We discussed preventative and palliative care of these lesions including supportive and accommodative shoegear, padding, prefabricated and custom molded accommodative orthoses, use of a pumice stone and lotions/creams daily. - At risk footcare due to diabetes and decreased pedal pulses   #Right  and Left midfoot arhthritis - Patient requesting corticosteroid injection right dorsal midfoot - Discussed use of good supportive shoe gear and avoiding tight fitting shoes which may aggravate dorsum of feet - May benefit from diabetic shoes as described below.  Prescription sent into [facility].  Procedure: Injection small joint right foot Discussed alternatives, risks, complications and verbal consent was obtained.  Location: Right dorsal midfoot periarticular [place] joint complex. Skin Prep: Alcohol . Injectate: 1cc 0.5% marcaine  plain, 0.5 cc dexamethasone  0.5 cc kenalog   Disposition: Patient tolerated procedure well. Injection site dressed with a band-aid.  Post-injection care was discussed and return precautions discussed.   #Onychomycosis with pain  -Nails palliatively debrided as below. -Educated on self-care - Reviewed proper use of the topical Penlac  which was prescribed last visit however was being used inconsistently.  Procedure: Nail Debridement Rationale: Pain Type of Debridement: manual, sharp debridement. Instrumentation: Nail nipper, rotary burr. Number of Nails: 10  # Diabetes with neuropathy # Bilateral pes planus -Patient educated on diabetes. Discussed proper diabetic foot care and discussed risks and complications of disease. Educated patient in depth on reasons to return to the office immediately should he/she discover anything concerning or new on the feet. All questions answered. Discussed proper shoes as  well.  - Prescription sent for diabetic shoes.  Would benefit  from this due to diminished circulation, neuropathy, pes planus foot type, and callus formation plantar lateral heel left foot - Referral to [facility] for this.  Dispensed packet to be completed by managing diabetes doctor with order information placed. -I certify that this diagnosis represents a distinct and separate diagnosis that requires evaluation and treatment separate from other procedures or diagnosis     Follow-up in 3 months for diabetic footcare        Jeanny Rymer L. Lamount, DPM [facility] / [facility]                    "

## 2024-06-29 NOTE — Patient Instructions (Signed)
 Look for urea 40% cream or ointment and apply to the thickened dry skin / calluses. This can be bought over the counter, at a pharmacy or online such as [employer].

## 2024-07-03 ENCOUNTER — Other Ambulatory Visit

## 2024-09-28 ENCOUNTER — Ambulatory Visit: Admitting: Podiatry
